# Patient Record
Sex: Female | Born: 1975
Health system: Southern US, Community
[De-identification: ages and names within clinical notes are randomized; demographics above are authoritative.]

## PROBLEM LIST (undated history)

## (undated) DIAGNOSIS — R51 Headache: Secondary | ICD-10-CM

## (undated) DIAGNOSIS — R600 Localized edema: Secondary | ICD-10-CM

## (undated) DIAGNOSIS — M1990 Unspecified osteoarthritis, unspecified site: Secondary | ICD-10-CM

## (undated) DIAGNOSIS — F53 Postpartum depression: Secondary | ICD-10-CM

## (undated) DIAGNOSIS — M549 Dorsalgia, unspecified: Secondary | ICD-10-CM

## (undated) DIAGNOSIS — E559 Vitamin D deficiency, unspecified: Secondary | ICD-10-CM

## (undated) DIAGNOSIS — T7840XA Allergy, unspecified, initial encounter: Secondary | ICD-10-CM

## (undated) DIAGNOSIS — O99345 Other mental disorders complicating the puerperium: Secondary | ICD-10-CM

## (undated) DIAGNOSIS — R0600 Dyspnea, unspecified: Secondary | ICD-10-CM

## (undated) DIAGNOSIS — N979 Female infertility, unspecified: Secondary | ICD-10-CM

## (undated) DIAGNOSIS — E538 Deficiency of other specified B group vitamins: Secondary | ICD-10-CM

## (undated) DIAGNOSIS — M3500 Sicca syndrome, unspecified: Principal | ICD-10-CM

## (undated) DIAGNOSIS — D219 Benign neoplasm of connective and other soft tissue, unspecified: Secondary | ICD-10-CM

## (undated) DIAGNOSIS — O169 Unspecified maternal hypertension, unspecified trimester: Secondary | ICD-10-CM

## (undated) HISTORY — DX: Localized edema: R60.0

## (undated) HISTORY — DX: Dyspnea, unspecified: R06.00

## (undated) HISTORY — DX: Vitamin D deficiency, unspecified: E55.9

## (undated) HISTORY — DX: Deficiency of other specified B group vitamins: E53.8

## (undated) HISTORY — DX: Unspecified maternal hypertension, unspecified trimester: O16.9

## (undated) HISTORY — DX: Allergy, unspecified, initial encounter: T78.40XA

## (undated) HISTORY — PX: LYMPHADENECTOMY: SHX15

## (undated) HISTORY — DX: Dorsalgia, unspecified: M54.9

## (undated) HISTORY — DX: Sicca syndrome, unspecified: M35.00

## (undated) HISTORY — DX: Postpartum depression: F53.0

## (undated) HISTORY — PX: ABDOMINAL ADHESION SURGERY: SHX90

## (undated) HISTORY — PX: ENDOMETRIAL ABLATION: SHX621

## (undated) HISTORY — DX: Female infertility, unspecified: N97.9

## (undated) HISTORY — DX: Unspecified osteoarthritis, unspecified site: M19.90

## (undated) HISTORY — PX: CHOLECYSTECTOMY: SHX55

## (undated) HISTORY — DX: Other mental disorders complicating the puerperium: O99.345

## (undated) HISTORY — PX: MYOMECTOMY: SHX85

---

## 1999-07-20 ENCOUNTER — Encounter (INDEPENDENT_AMBULATORY_CARE_PROVIDER_SITE_OTHER): Payer: Self-pay

## 1999-07-20 ENCOUNTER — Inpatient Hospital Stay (HOSPITAL_COMMUNITY): Admission: RE | Admit: 1999-07-20 | Discharge: 1999-07-22 | Payer: Self-pay | Admitting: Obstetrics & Gynecology

## 2000-05-10 ENCOUNTER — Encounter: Payer: Self-pay | Admitting: Emergency Medicine

## 2000-05-10 ENCOUNTER — Emergency Department (HOSPITAL_COMMUNITY): Admission: EM | Admit: 2000-05-10 | Discharge: 2000-05-10 | Payer: Self-pay | Admitting: *Deleted

## 2000-09-11 ENCOUNTER — Ambulatory Visit (HOSPITAL_COMMUNITY): Admission: RE | Admit: 2000-09-11 | Discharge: 2000-09-11 | Payer: Self-pay | Admitting: Obstetrics & Gynecology

## 2000-09-11 ENCOUNTER — Encounter: Payer: Self-pay | Admitting: Obstetrics & Gynecology

## 2001-02-04 ENCOUNTER — Other Ambulatory Visit: Admission: RE | Admit: 2001-02-04 | Discharge: 2001-02-04 | Payer: Self-pay | Admitting: Obstetrics and Gynecology

## 2003-01-05 ENCOUNTER — Other Ambulatory Visit: Admission: RE | Admit: 2003-01-05 | Discharge: 2003-01-05 | Payer: Self-pay | Admitting: Internal Medicine

## 2003-01-06 ENCOUNTER — Encounter: Payer: Self-pay | Admitting: Internal Medicine

## 2003-01-06 ENCOUNTER — Encounter: Admission: RE | Admit: 2003-01-06 | Discharge: 2003-01-06 | Payer: Self-pay | Admitting: Internal Medicine

## 2005-09-17 ENCOUNTER — Ambulatory Visit: Payer: Self-pay | Admitting: Family Medicine

## 2005-11-08 ENCOUNTER — Other Ambulatory Visit: Admission: RE | Admit: 2005-11-08 | Discharge: 2005-11-08 | Payer: Self-pay | Admitting: Obstetrics and Gynecology

## 2006-04-04 ENCOUNTER — Ambulatory Visit: Payer: Self-pay | Admitting: Family Medicine

## 2006-04-09 ENCOUNTER — Encounter: Admission: RE | Admit: 2006-04-09 | Discharge: 2006-04-09 | Payer: Self-pay | Admitting: Family Medicine

## 2006-11-05 ENCOUNTER — Ambulatory Visit: Payer: Self-pay | Admitting: Family Medicine

## 2007-06-02 ENCOUNTER — Telehealth (INDEPENDENT_AMBULATORY_CARE_PROVIDER_SITE_OTHER): Payer: Self-pay | Admitting: *Deleted

## 2007-10-29 LAB — CONVERTED CEMR LAB: Pap Smear: NORMAL

## 2007-12-31 ENCOUNTER — Ambulatory Visit: Payer: Self-pay | Admitting: Family Medicine

## 2007-12-31 DIAGNOSIS — R5383 Other fatigue: Secondary | ICD-10-CM

## 2007-12-31 DIAGNOSIS — R5381 Other malaise: Secondary | ICD-10-CM

## 2007-12-31 DIAGNOSIS — R55 Syncope and collapse: Secondary | ICD-10-CM | POA: Insufficient documentation

## 2008-01-02 ENCOUNTER — Telehealth (INDEPENDENT_AMBULATORY_CARE_PROVIDER_SITE_OTHER): Payer: Self-pay | Admitting: *Deleted

## 2008-01-02 LAB — CONVERTED CEMR LAB
ALT: 19 units/L (ref 0–35)
AST: 26 units/L (ref 0–37)
Albumin: 3.7 g/dL (ref 3.5–5.2)
Alkaline Phosphatase: 58 units/L (ref 39–117)
BUN: 9 mg/dL (ref 6–23)
Basophils Relative: 0.5 % (ref 0.0–1.0)
Calcium: 9 mg/dL (ref 8.4–10.5)
Chloride: 111 meq/L (ref 96–112)
Creatinine, Ser: 0.8 mg/dL (ref 0.4–1.2)
Eosinophils Relative: 2.2 % (ref 0.0–5.0)
Folate: 8.5 ng/mL
GFR calc non Af Amer: 88 mL/min
Hemoglobin: 13.6 g/dL (ref 12.0–15.0)
LDL Cholesterol: 91 mg/dL (ref 0–99)
Lymphocytes Relative: 34.9 % (ref 12.0–46.0)
Monocytes Relative: 8 % (ref 3.0–11.0)
Neutro Abs: 2.4 10*3/uL (ref 1.4–7.7)
Platelets: 141 10*3/uL — ABNORMAL LOW (ref 150–400)
Prolactin: 9.6 ng/mL
RDW: 11.9 % (ref 11.5–14.6)
Sodium: 140 meq/L (ref 135–145)
Total Bilirubin: 1.5 mg/dL — ABNORMAL HIGH (ref 0.3–1.2)
Triglycerides: 63 mg/dL (ref 0–149)
VLDL: 13 mg/dL (ref 0–40)
Vitamin B-12: 359 pg/mL (ref 211–911)
WBC: 4.4 10*3/uL — ABNORMAL LOW (ref 4.5–10.5)

## 2008-02-02 ENCOUNTER — Ambulatory Visit: Payer: Self-pay | Admitting: Family Medicine

## 2008-02-04 ENCOUNTER — Encounter (INDEPENDENT_AMBULATORY_CARE_PROVIDER_SITE_OTHER): Payer: Self-pay | Admitting: *Deleted

## 2008-02-04 LAB — CONVERTED CEMR LAB: Vit D, 1,25-Dihydroxy: 35 (ref 30–89)

## 2008-02-09 ENCOUNTER — Emergency Department (HOSPITAL_COMMUNITY): Admission: EM | Admit: 2008-02-09 | Discharge: 2008-02-09 | Payer: Self-pay | Admitting: Emergency Medicine

## 2008-04-06 ENCOUNTER — Ambulatory Visit: Payer: Self-pay | Admitting: Family Medicine

## 2008-04-06 DIAGNOSIS — E669 Obesity, unspecified: Secondary | ICD-10-CM

## 2008-04-06 HISTORY — DX: Obesity, unspecified: E66.9

## 2008-04-21 ENCOUNTER — Encounter: Payer: Self-pay | Admitting: Family Medicine

## 2008-04-21 ENCOUNTER — Encounter: Admission: RE | Admit: 2008-04-21 | Discharge: 2008-04-21 | Payer: Self-pay | Admitting: Family Medicine

## 2008-04-30 ENCOUNTER — Ambulatory Visit: Payer: Self-pay | Admitting: Family Medicine

## 2008-04-30 LAB — CONVERTED CEMR LAB
Bilirubin Urine: NEGATIVE
Blood in Urine, dipstick: NEGATIVE
Glucose, Urine, Semiquant: NEGATIVE
Ketones, urine, test strip: NEGATIVE
Protein, U semiquant: NEGATIVE
Specific Gravity, Urine: 1.02
WBC Urine, dipstick: NEGATIVE
pH: 6

## 2008-05-12 LAB — CONVERTED CEMR LAB
Albumin: 3.8 g/dL (ref 3.5–5.2)
BUN: 10 mg/dL (ref 6–23)
Basophils Absolute: 0 10*3/uL (ref 0.0–0.1)
Basophils Relative: 0 % (ref 0–1)
CO2: 19 meq/L (ref 19–32)
Chloride: 108 meq/L (ref 96–112)
Folate: 13.6 ng/mL
HDL: 36 mg/dL — ABNORMAL LOW (ref >39)
Hemoglobin: 13.4 g/dL (ref 12.0–15.0)
LDL Cholesterol: 81 mg/dL (ref 0–99)
Lymphocytes Relative: 40 % (ref 12–46)
MCHC: 33.9 g/dL (ref 30.0–36.0)
Monocytes Relative: 7 % (ref 3–12)
Neutro Abs: 2.6 10*3/uL (ref 1.7–7.7)
Neutrophils Relative %: 52 % (ref 43–77)
Potassium: 3.8 meq/L (ref 3.5–5.3)
RBC: 4.27 M/uL (ref 3.87–5.11)
Total Bilirubin: 1.2 mg/dL (ref 0.3–1.2)
Total Protein: 7.1 g/dL (ref 6.0–8.3)
Triglycerides: 67 mg/dL (ref <150)
VLDL: 13 mg/dL (ref 0–40)
Vitamin B-12: 308 pg/mL (ref 211–911)

## 2008-05-13 ENCOUNTER — Telehealth (INDEPENDENT_AMBULATORY_CARE_PROVIDER_SITE_OTHER): Payer: Self-pay | Admitting: *Deleted

## 2008-05-13 ENCOUNTER — Encounter (INDEPENDENT_AMBULATORY_CARE_PROVIDER_SITE_OTHER): Payer: Self-pay | Admitting: *Deleted

## 2008-05-14 IMAGING — CT CT ABDOMEN W/O CM
1 of 5 series · 13 of 32 positions shown, 19 images · non-contrast
Comparison: None available

CT ABDOMEN

CLINICAL DATA: Abdominal pain, rule out stones

CT ABDOMEN AND PELVIS WITHOUT CONTRAST
TECHNIQUE: Multidetector CT imaging of the abdomen and pelvis was
performed following the standard
protocol without intravenous contrast.

[Series 4: recon 3: greater than (id) · axial · 0.83mm/px · z∈[-468,-124]mm · 13 of 320 slices shown, 19 images]
[im 23/320  soft-tissue]
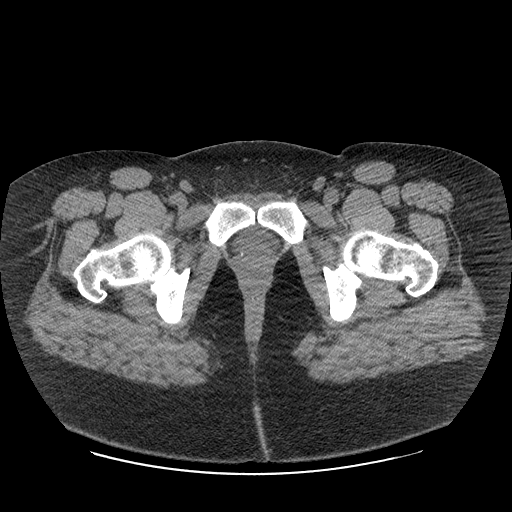
[im 23/320  bone]
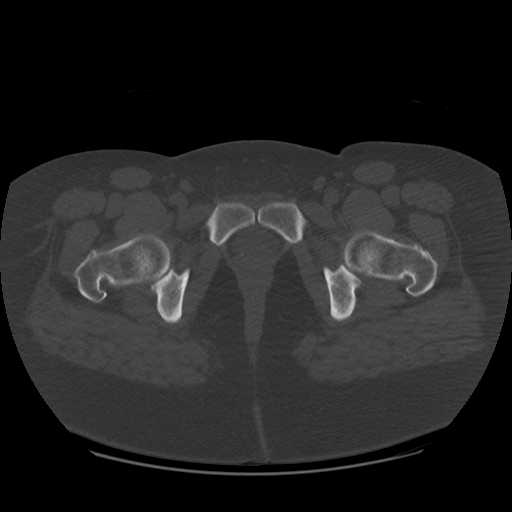
[im 46/320  soft-tissue]
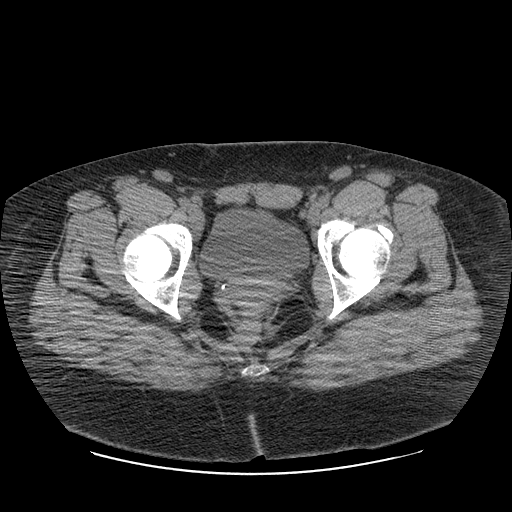
[im 69/320  soft-tissue]
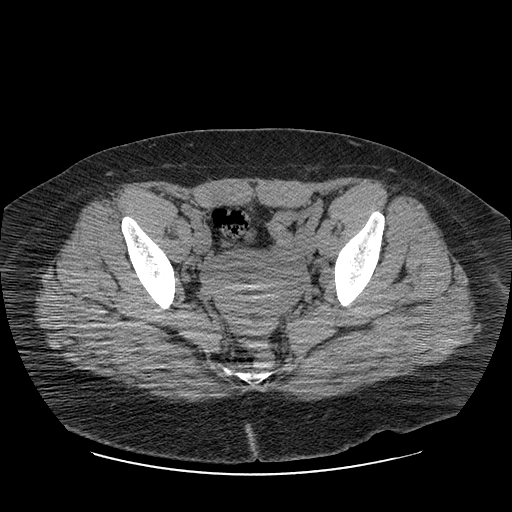
[im 92/320  soft-tissue]
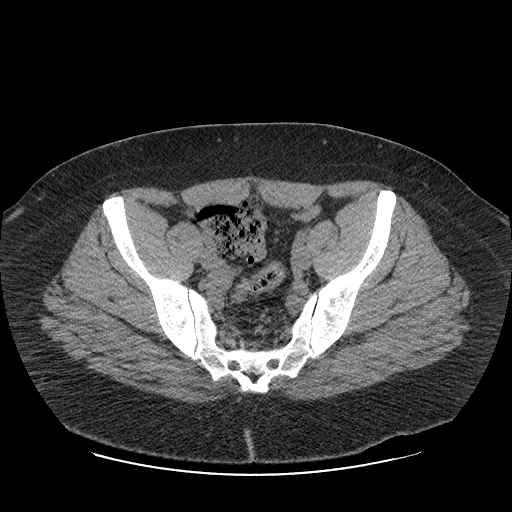
[im 114/320  soft-tissue]
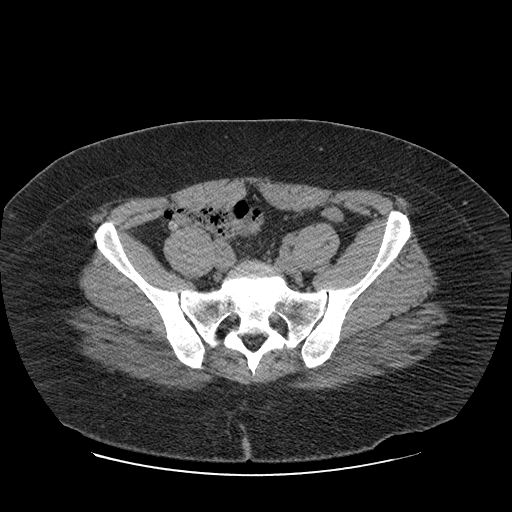
[im 137/320  soft-tissue]
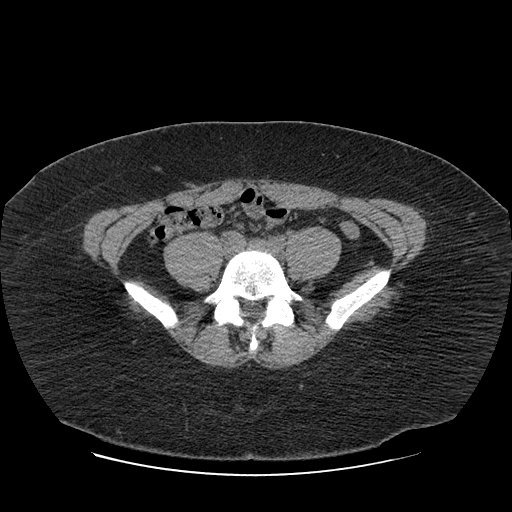
[im 160/320  soft-tissue]
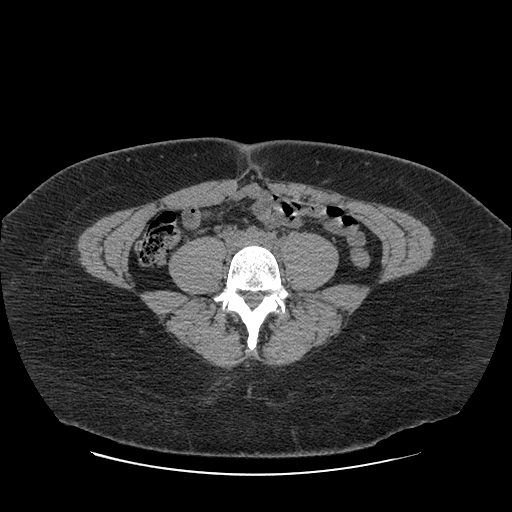
[im 183/320  soft-tissue]
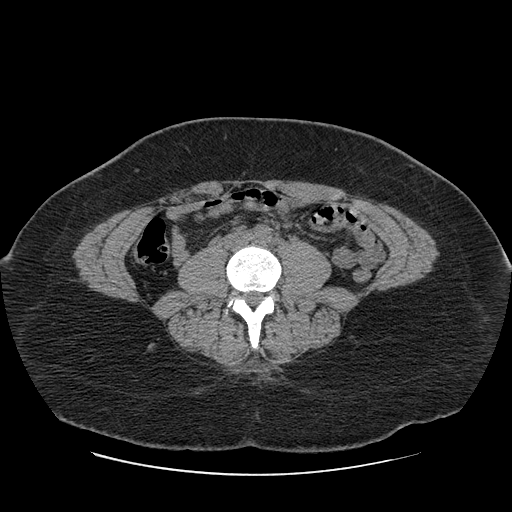
[im 206/320  soft-tissue]
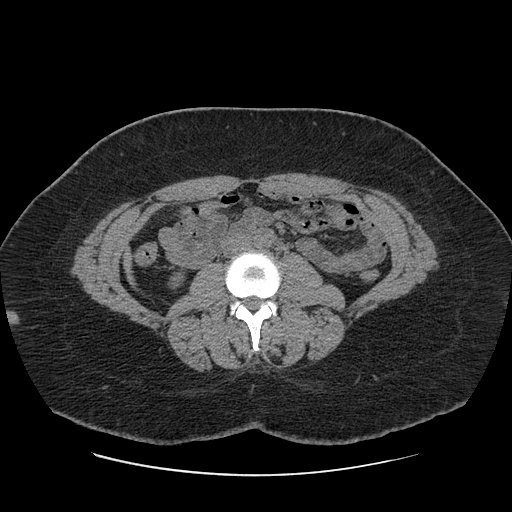
[im 206/320  bone]
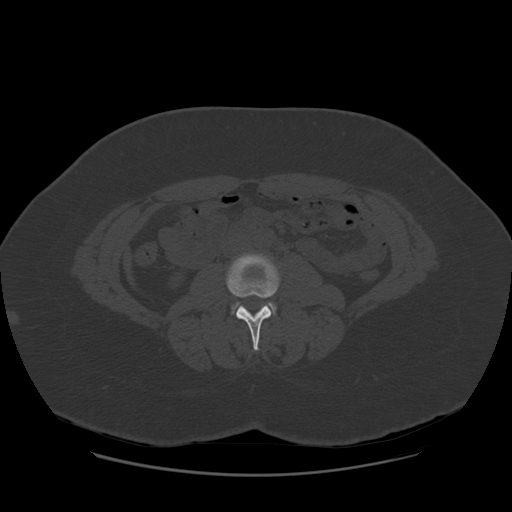
[im 228/320  soft-tissue]
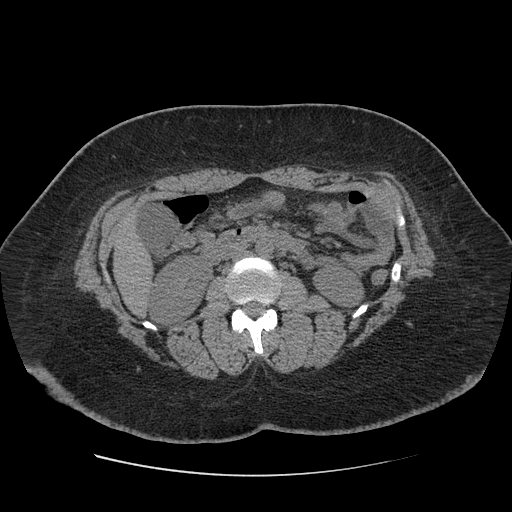
[im 228/320  lung]
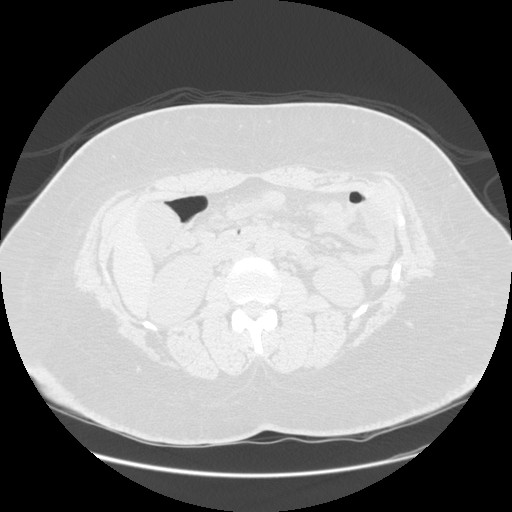
[im 251/320  soft-tissue]
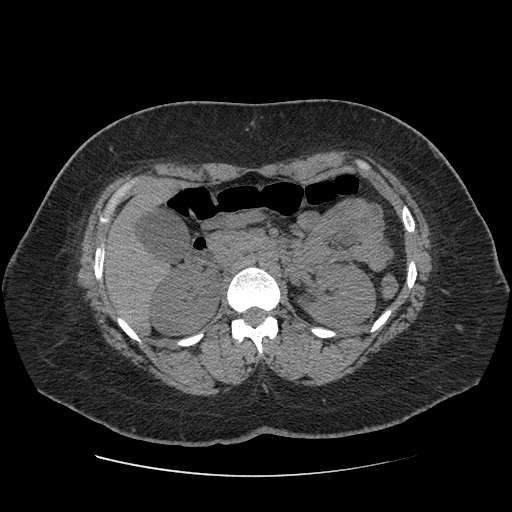
[im 251/320  lung]
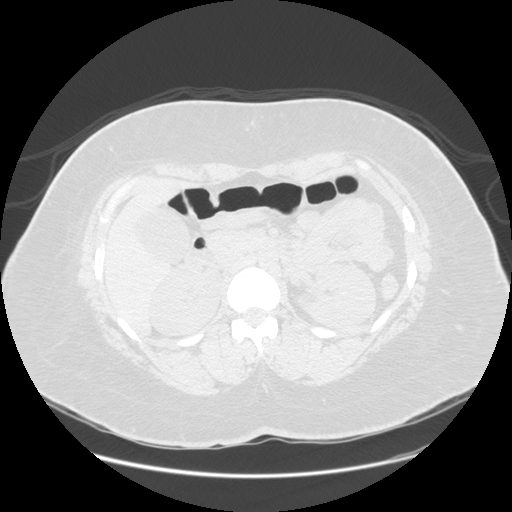
[im 274/320  soft-tissue]
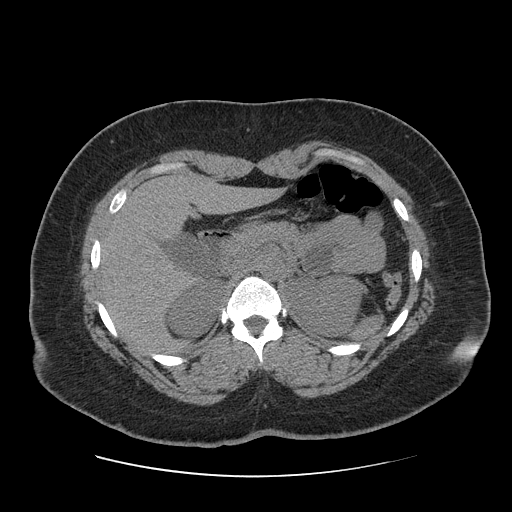
[im 274/320  lung]
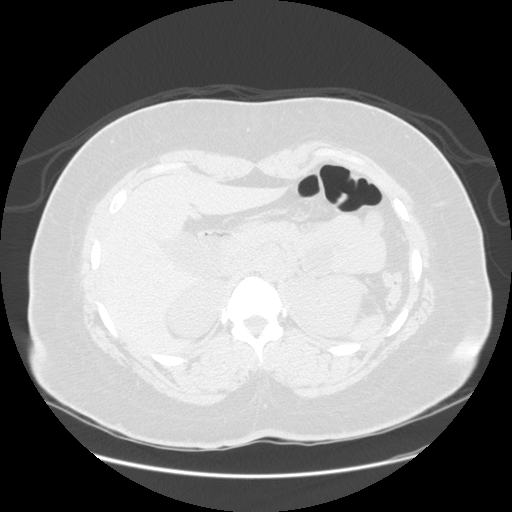
[im 297/320  soft-tissue]
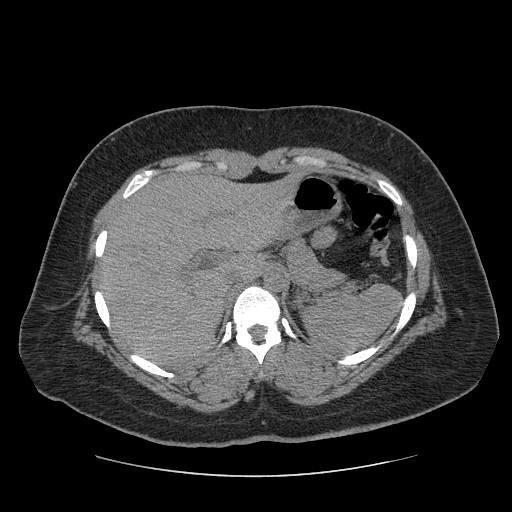
[im 297/320  lung]
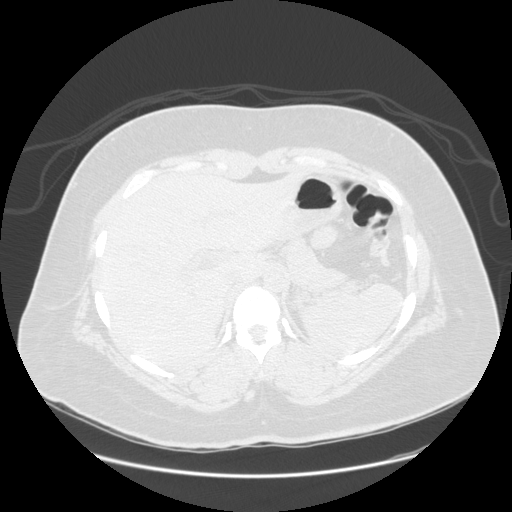

[13 of 32 positions shown; findings below may reference images not displayed]

FINDINGS: Bases are clear.  Noncontrast images of the abdomen
demonstrate normal liver, gallbladder, and pancreas.  The spleen
and adrenal glands appear normal.

No evidence of nephrolithiasis, ureteral lithiasis, or obstructive
uropathy.

Bowel is normal in course and caliber.  Normal appendix.
IMPRESSION: 1.  No acute abdominal process.
2.  No evidence of ureteral or nephrolithiasis.

CT PELVIS
FINDINGS: No free fluid in the pelvis.  The bladder uterus appear
normal.  The left and right ovary appears normal.  The rectum
appears normal.  No aggressive osseous lesions.
IMPRESSION: 1.  No acute pelvic process.

## 2008-05-31 ENCOUNTER — Ambulatory Visit: Payer: Self-pay | Admitting: Family Medicine

## 2008-05-31 ENCOUNTER — Encounter: Admission: RE | Admit: 2008-05-31 | Discharge: 2008-05-31 | Payer: Self-pay | Admitting: Family Medicine

## 2008-05-31 ENCOUNTER — Encounter (INDEPENDENT_AMBULATORY_CARE_PROVIDER_SITE_OTHER): Payer: Self-pay | Admitting: *Deleted

## 2008-05-31 DIAGNOSIS — R109 Unspecified abdominal pain: Secondary | ICD-10-CM | POA: Insufficient documentation

## 2008-05-31 DIAGNOSIS — K810 Acute cholecystitis: Secondary | ICD-10-CM | POA: Insufficient documentation

## 2008-05-31 LAB — CONVERTED CEMR LAB
Nitrite: NEGATIVE
Specific Gravity, Urine: <1.005
pH: 7.5

## 2008-06-01 ENCOUNTER — Telehealth (INDEPENDENT_AMBULATORY_CARE_PROVIDER_SITE_OTHER): Payer: Self-pay | Admitting: *Deleted

## 2008-06-01 ENCOUNTER — Encounter (INDEPENDENT_AMBULATORY_CARE_PROVIDER_SITE_OTHER): Payer: Self-pay | Admitting: *Deleted

## 2008-06-01 LAB — CONVERTED CEMR LAB
AST: 19 units/L (ref 0–37)
Basophils Absolute: 0 10*3/uL (ref 0.0–0.1)
Basophils Relative: 0.4 % (ref 0.0–3.0)
Chloride: 107 meq/L (ref 96–112)
Creatinine, Ser: 0.9 mg/dL (ref 0.4–1.2)
Eosinophils Absolute: 0.1 10*3/uL (ref 0.0–0.7)
GFR calc non Af Amer: 76 mL/min
Hgb A1c MFr Bld: 5.2 % (ref 4.6–6.0)
MCHC: 33 g/dL (ref 30.0–36.0)
MCV: 97.7 fL (ref 78.0–100.0)
Neutrophils Relative %: 61.5 % (ref 43.0–77.0)
Platelets: 173 10*3/uL (ref 150–400)
RBC: 4.08 M/uL (ref 3.87–5.11)
RDW: 11.1 % — ABNORMAL LOW (ref 11.5–14.6)
TSH: 2.59 microintl units/mL (ref 0.35–5.50)
Total Bilirubin: 1.2 mg/dL (ref 0.3–1.2)

## 2008-06-02 ENCOUNTER — Encounter: Payer: Self-pay | Admitting: Family Medicine

## 2008-06-09 ENCOUNTER — Encounter: Payer: Self-pay | Admitting: Family Medicine

## 2008-06-16 ENCOUNTER — Encounter: Payer: Self-pay | Admitting: Family Medicine

## 2008-06-24 ENCOUNTER — Telehealth (INDEPENDENT_AMBULATORY_CARE_PROVIDER_SITE_OTHER): Payer: Self-pay | Admitting: *Deleted

## 2008-07-14 ENCOUNTER — Encounter (INDEPENDENT_AMBULATORY_CARE_PROVIDER_SITE_OTHER): Payer: Self-pay | Admitting: *Deleted

## 2008-07-14 ENCOUNTER — Ambulatory Visit (HOSPITAL_COMMUNITY): Admission: RE | Admit: 2008-07-14 | Discharge: 2008-07-15 | Payer: Self-pay | Admitting: *Deleted

## 2008-08-06 ENCOUNTER — Encounter: Payer: Self-pay | Admitting: Family Medicine

## 2008-08-06 DIAGNOSIS — Z9089 Acquired absence of other organs: Secondary | ICD-10-CM | POA: Insufficient documentation

## 2010-09-27 ENCOUNTER — Encounter
Admission: RE | Admit: 2010-09-27 | Discharge: 2010-09-27 | Payer: Self-pay | Source: Home / Self Care | Admitting: Obstetrics and Gynecology

## 2011-03-06 NOTE — Op Note (Signed)
NAMEMarland Kitchen  Stacey Huerta, Stacey Huerta NO.:  [id number]   MEDICAL RECORD NO.:  [id number]          PATIENT TYPE:  OIB   LOCATION:  1336                         FACILITY:  [facility]   PHYSICIAN:  Alfonse Ras, MD   DATE OF BIRTH:  1976-06-15   DATE OF PROCEDURE:  DATE OF DISCHARGE:                               OPERATIVE REPORT   PREOPERATIVE DIAGNOSIS:  Symptomatic cholelithiasis.   POSTOPERATIVE DIAGNOSIS:  Symptomatic cholelithiasis.   FINDINGS:  Normal cholangiogram, very short cystic duct.   PROCEDURE:  Laparoscopic cholecystectomy with intraoperative  cholangiogram.   SURGEON:  Alfonse Ras, M.D.   ASSISTANT:  Wilmon Arms. Tsuei, M.D.   ANESTHESIA:  General.   DESCRIPTION:  The patient was taken to the operating room and placed in  a supine position.  After adequate general anesthesia was induced, the  abdomen was prepped and draped in normal sterile fashion.  Using a  transverse infraumbilical incision, I dissected down to the fascia.  Fascia was opened vertically.  An 0-Vicryl purse-string suture was  placed around the fascial defect and Hassan trocar was placed in the  abdomen.  Pneumoperitoneum was obtained.  Under direct vision a 11-mm  trocar was placed in subxiphoid region and two 5-mm trocars were placed  in the right abdomen.  Gallbladder was identified and retracted  cephalad.  Filmy adhesions were taken down with blunt dissection off the  gallbladder wall.  The neck of the gallbladder was retracted laterally  and a very short cystic duct was identified after tedious dissection,  identifying its junction with the gallbladder and common bile duct.  It  was clipped proximally on the gallbladder and a small dichotomy was  made.  Cholangiogram was performed which showed a very short cystic duct  but free flow into the duodenum.  No filling defects, and normal right  and left hepatic ducts; because the cystic duct was so short, I opted to  ligate it with a  0-PDS Endoloop suture and a clip, it was previously  divided.  Cystic artery was dissected in a similar fashion with a  critical view, it was triplyclipped and divided.  The gallbladder was  taken off the gallbladder bed using Bovie electrocautery and placed in  an EndoCatch bag.  The right upper quadrant was copiously irrigated and  adequate hemostasis was ensured.  The gallbladder was removed through  the umbilical port without difficulty.  The fascial defect was closed  with an 0-Vicryl purse-string suture.  Skin incisions were closed with  subcuticular 4-0 Monocryl.  Dermabond and Steri-Strips were placed, and  sterile dressings.  The patient tolerated the procedure well and went to  PACU in good condition.      Alfonse Ras, MD  Electronically Signed    KRE/MEDQ  D:  07/14/2008  T:  07/15/2008  Job:  [id number]

## 2011-03-09 NOTE — Discharge Summary (Signed)
[facility]  Patient:    Stacey Huerta                     MRN: [id number] Adm. Date:  [id number] Disc. Date: 07/22/99 Attending:  Cleatrice Burke                           Discharge Summary  DISCHARGE DIAGNOSIS:          Symptomatic fibroids, questionable unicornate uterus.  OPERATIONS/PROCEDURES:        Abdominal myomectomy.  HISTORY:                      Patient is a 35 year old gravida 0 who complained of sporadic menometrorrhagia, urinary frequency, dyspareunia.  Patient has a known  history of fibroid uterus.  The fibroids were felt to have increased in size. Patient was therefore desirous of definitive therapy and was consented for an abdominal myomectomy.  She had an uneventful procedure on September 28.  HOSPITAL COURSE:              Essentially unremarkable.  She was voiding well, ambulating well and tolerating p.o. well at the time of her discharge on postoperative day #2.  Abdomen was soft.  Incision was clean, dry and intact. Patient was afebrile and she was tolerating regular diet.  At this point there ere no contraindications for her being discharge.  DISCHARGE PHYSICAL EXAMINATION:  VITAL SIGNS:                  Stable.  Patient was afebrile.  LUNGS:                        Clear to auscultation bilaterally.  HEART:                        Regular rate and rhythm.  ABDOMEN:                      Soft.  Incision clean, dry and intact.  EXTREMITIES:                  Revealed no edema.  DISCHARGE LABORATORY DATA:    Her white count was 9.7, hemoglobin 11.2, hematocrit 32.8, platelets 181.  Pathology showed benign leiomyoma and smooth muscle, uterine serosal fibrous adhesions associated with benign mesothelial cells.  DIET:                         As tolerated.  ACTIVITY:                     Per [facility] discharge instruction  sheet.  MEDICATIONS:                  Tylox one q.3-4h. p.r.n.  pain.  FOLLOWUP:                     On October 2 for staple removal and then again in six weeks. DD:  09/10/99 TD:  09/11/99 Job: [id number]

## 2011-07-17 LAB — URINALYSIS, ROUTINE W REFLEX MICROSCOPIC
Hgb urine dipstick: NEGATIVE
Nitrite: NEGATIVE
Specific Gravity, Urine: 1.022
Urobilinogen, UA: 1

## 2011-07-17 LAB — POCT PREGNANCY, URINE
Operator id: 277751
Preg Test, Ur: NEGATIVE

## 2011-07-23 LAB — COMPREHENSIVE METABOLIC PANEL
Albumin: 3.4 — ABNORMAL LOW
Alkaline Phosphatase: 63
BUN: 5 — ABNORMAL LOW
Chloride: 108
Glucose, Bld: 95
Potassium: 3.7
Total Bilirubin: 1.8 — ABNORMAL HIGH

## 2011-07-23 LAB — DIFFERENTIAL
Basophils Absolute: 0
Basophils Relative: 1
Eosinophils Absolute: 0.1
Monocytes Absolute: 0.5
Neutro Abs: 2.7
Neutrophils Relative %: 57

## 2011-07-23 LAB — PREGNANCY, URINE: Preg Test, Ur: NEGATIVE

## 2011-07-23 LAB — CBC
HCT: 38
Hemoglobin: 12.9
RBC: 4.07
WBC: 4.8

## 2011-12-19 ENCOUNTER — Ambulatory Visit: Payer: Self-pay | Admitting: Obstetrics and Gynecology

## 2011-12-27 ENCOUNTER — Other Ambulatory Visit: Payer: Self-pay | Admitting: Obstetrics and Gynecology

## 2011-12-27 DIAGNOSIS — N979 Female infertility, unspecified: Secondary | ICD-10-CM

## 2012-01-03 ENCOUNTER — Ambulatory Visit (HOSPITAL_COMMUNITY)
Admission: RE | Admit: 2012-01-03 | Discharge: 2012-01-03 | Disposition: A | Discharge status: Home / Self Care | Payer: No Typology Code available for payment source | Source: Ambulatory Visit | Attending: Obstetrics and Gynecology | Admitting: Obstetrics and Gynecology

## 2012-01-03 DIAGNOSIS — N979 Female infertility, unspecified: Secondary | ICD-10-CM | POA: Insufficient documentation

## 2012-01-03 DIAGNOSIS — D259 Leiomyoma of uterus, unspecified: Secondary | ICD-10-CM | POA: Insufficient documentation

## 2012-01-03 MED ORDER — IOHEXOL 300 MG/ML  SOLN: 60.0000 mL | Freq: Once | INTRAMUSCULAR | Status: AC | PRN

## 2012-01-16 ENCOUNTER — Encounter (INDEPENDENT_AMBULATORY_CARE_PROVIDER_SITE_OTHER): Payer: No Typology Code available for payment source | Admitting: Obstetrics and Gynecology

## 2012-01-16 DIAGNOSIS — Z01419 Encounter for gynecological examination (general) (routine) without abnormal findings: Secondary | ICD-10-CM

## 2012-01-16 DIAGNOSIS — N912 Amenorrhea, unspecified: Secondary | ICD-10-CM

## 2012-01-16 DIAGNOSIS — R87810 Cervical high risk human papillomavirus (HPV) DNA test positive: Secondary | ICD-10-CM

## 2012-02-14 ENCOUNTER — Other Ambulatory Visit: Payer: Self-pay

## 2012-02-14 ENCOUNTER — Telehealth: Payer: Self-pay | Admitting: Obstetrics and Gynecology

## 2012-02-14 MED ORDER — MEFENAMIC ACID 250 MG PO CAPS: ORAL_CAPSULE | ORAL | Status: DC

## 2012-02-14 NOTE — Telephone Encounter (Signed)
Tc from pt. Pt wants [facility] to know has laparoscopic surgery sched 05/23/12 with Dr. April Manson. Pt states,"will likely return to our office to continue infertility tx after surgery done". Pt c/o severe abd pain with this menses and is requesting Ponstel(presc by prev physician). Told pt will consult with [facility] per recs. Pt voices understanding.

## 2012-02-14 NOTE — Telephone Encounter (Signed)
PT WANT TO SPEAK WITH CHANDRA

## 2012-02-15 NOTE — Telephone Encounter (Signed)
Rx for Ponstell called to [facility]([address]) per vh. Pt agrees.

## 2012-04-30 NOTE — H&P (Signed)
REPRODUCTIVE ENDOCRINOLOGY/INFERTILITY PREOP NOTE:  Stacey Huerta  1976/02/06  [id number]  Stacey Huerta is a 36 y.o. female referred to me by Dr. Dierdre Forth for myoma and right tubal proximal occlusion. She is scheduled for  laparoscopy, LOA, myomectomy via gel-port assistance, with concomitant set up to perform fluoroscopic tubal catheterization to evaluate and recanalize right tube. She also has significant right-sided pelvic pain, worsening with her periods. Patient's has a history of myomectomy for the single, 377 g myoma via laparotomy in 2001. Lately she has been having heavy periods, with dysmenorrhea, lasting for 5 days. Menstrual intervals, have also been irregular every 21-28 days. Since she has been trying to conceive for 24 months, and HSG was ordered. I reviewed the HSG from 01/03/2012. The uterine cavity is distended with around filling defect, probable submucosal myoma. The right tube was proximally occluded. The left tube fills into the distal segment, and its spillage cannot be shown but there is no distention.  She had reproductive hormonal workup done: Progesterone was 5.2 ng per mL, prolactin was 9.0 ng per mL and TSH was 1.9-3 micro-international units per mL.  GYN history:  Menarche at age 53 periods every 21-28 days, lasting for 5 days. LMP was 01/19/2012, PMP was 12/27/2011  She's never used birth control pills.  The couple has intercourse about 4 times per week. She has history of herpes simplex infection from 2011. No other STDs. Last Pap smear was 12/2011.  Medical history: None  Medications: None.  Allergies: Allergic to sulfa and penicillin  Social history: She is divorced, she lives with her significant other, Antonietta Barcelona. She is a IT trainer. She denies substance abuse. She exercises occasionally.  Surgical history: 2009-up scopic cholecystectomy.  2001-laparotomy, myomectomy, ( see history of present illness)  Review of systems: All 12 systems were reviewed, is  documented in the attached questionnaire. It's relevant for dizziness, headaches, and clear nipple discharge from both breasts.  Family history: Maternal aunt has breast cancer. Paternal grandmother had colon cancer. Father and paternal grandmother had diabetes.   Physical exam:  Well-developed, overweight, African American female in no acute distress  BP 124/80  Pulse 68  Temp 98.6 F (37 C)  Ht 1.778 m (5\' 10" )  Wt 151.501 kg (334 lb)  BMI 47.92 kg/m2  LMP 04/29/2012 Breasts: No masses, pendulous. Clear , clear discharge from multiple ductal openings (wet mount of the nipple discharge, shows amorphous material and no fat globules)  Abdomen: Soft, nontender, no hepatosplenomegaly. Well-healed Pfannenstiel scar.  Pelvic exam:  External genitalia, Bartholin's, Skene's, urethra: Normal Vagina; normal Cervix: grossly normal Corpus: Normal size anteverted, nontender Adnexa: No masses, nontender  Transvaginal ultrasound:  Uterine corpus it measures 56 x 54 x 42 mm with a volume of 67.5 cm. Endometrium is 7.5 mm thick and is distorted and pushed anteriorly with a posterior right sided type II submucosal myoma, measuring 31 x 24 x 31 mm. Only 20-30% of the myoma appears to be intracavitary.  Right ovary measures 32 x 37 x 28 mm, with a volume of 17 cm. It has antral follicle count of 8.  Left ovary measures 23 x 24 x 17 mm with a volume of 4.7 cm. It has an antral follicle count of 3.   Impression: Recurrent uterine leiomyoma, symptomatic. It is of type II submucosal location.  Right proximal tubal occlusion: Artifact versus post myomectomy consequence.  Left fallopian tube: Probably patent.  Advanced reproductive age with a desire to conceive   Plan:  Laparoscopy, LOA,  myomectomy via gel-port assistance, with concomitant set up to perform fluoroscopic tubal catheterization to evaluate and recanalize right tube. I discussed the benefits, risks and recovery time from such surgery. If  endometriosis is encountered during laparoscopy, I will ablate or excise it as well.   Electronically signed by: Judie Petit. Fermin Schwab, MD 04/30/2012 9:02 AM

## 2012-05-09 ENCOUNTER — Encounter (HOSPITAL_COMMUNITY): Payer: Self-pay

## 2012-05-16 ENCOUNTER — Encounter (HOSPITAL_COMMUNITY)
Admission: RE | Admit: 2012-05-16 | Discharge: 2012-05-16 | Disposition: A | Discharge status: Home / Self Care | Payer: No Typology Code available for payment source | Source: Ambulatory Visit | Attending: Obstetrics and Gynecology | Admitting: Obstetrics and Gynecology

## 2012-05-16 ENCOUNTER — Encounter (HOSPITAL_COMMUNITY): Payer: Self-pay

## 2012-05-16 HISTORY — DX: Headache: R51

## 2012-05-16 LAB — CBC
HCT: 37.2 % (ref 36.0–46.0)
MCHC: 33.6 g/dL (ref 30.0–36.0)
Platelets: 179 10*3/uL (ref 150–400)
RDW: 12.7 % (ref 11.5–15.5)
WBC: 5.3 10*3/uL (ref 4.0–10.5)

## 2012-05-16 LAB — SURGICAL PCR SCREEN
MRSA, PCR: NEGATIVE
Staphylococcus aureus: NEGATIVE

## 2012-05-16 NOTE — Patient Instructions (Addendum)
Your procedure is scheduled on:05/23/12  Enter through the Main Entrance at :0600 am Pick up desk phone and dial [phone] and inform us of your arrival.  Please call [phone] if you have any problems the morning of surgery.  Remember: Do not eat after midnight:Thursday Do not drink after:midnight Thursday  Take these meds the morning of surgery with a sip of water:none  DO NOT wear jewelry, eye make-up, lotion, or dark fingernail polish. Do not shave for 48 hours prior to surgery.  If you are to be admitted after surgery, leave suitcase in car until your room has been assigned. Patients discharged on the day of surgery will not be allowed to drive home.   Remember to use your Hibiclens as instructed.

## 2012-05-22 MED ORDER — GENTAMICIN SULFATE 40 MG/ML IJ SOLN
INTRAVENOUS | Status: AC
  Administered 2012-05-23: 100 mL via INTRAVENOUS
  Filled 2012-05-22: qty 12.4

## 2012-05-23 ENCOUNTER — Ambulatory Visit (HOSPITAL_COMMUNITY): Payer: No Typology Code available for payment source | Admitting: Anesthesiology

## 2012-05-23 ENCOUNTER — Encounter (HOSPITAL_COMMUNITY): Payer: Self-pay

## 2012-05-23 ENCOUNTER — Encounter (HOSPITAL_COMMUNITY)
Admission: RE | Disposition: A | Discharge status: Home / Self Care | Payer: Self-pay | Source: Ambulatory Visit | Attending: Obstetrics and Gynecology

## 2012-05-23 ENCOUNTER — Ambulatory Visit (HOSPITAL_COMMUNITY)
Admission: RE | Admit: 2012-05-23 | Discharge: 2012-05-23 | Disposition: A | Discharge status: Home / Self Care | Payer: No Typology Code available for payment source | Source: Ambulatory Visit | Attending: Obstetrics and Gynecology | Admitting: Obstetrics and Gynecology

## 2012-05-23 ENCOUNTER — Ambulatory Visit (HOSPITAL_COMMUNITY): Payer: No Typology Code available for payment source

## 2012-05-23 ENCOUNTER — Encounter (HOSPITAL_COMMUNITY): Payer: Self-pay | Admitting: Anesthesiology

## 2012-05-23 DIAGNOSIS — N949 Unspecified condition associated with female genital organs and menstrual cycle: Secondary | ICD-10-CM | POA: Insufficient documentation

## 2012-05-23 DIAGNOSIS — N803 Endometriosis of pelvic peritoneum, unspecified: Secondary | ICD-10-CM | POA: Insufficient documentation

## 2012-05-23 DIAGNOSIS — N971 Female infertility of tubal origin: Secondary | ICD-10-CM | POA: Insufficient documentation

## 2012-05-23 DIAGNOSIS — D251 Intramural leiomyoma of uterus: Secondary | ICD-10-CM | POA: Insufficient documentation

## 2012-05-23 DIAGNOSIS — Z01812 Encounter for preprocedural laboratory examination: Secondary | ICD-10-CM | POA: Insufficient documentation

## 2012-05-23 DIAGNOSIS — D252 Subserosal leiomyoma of uterus: Secondary | ICD-10-CM

## 2012-05-23 DIAGNOSIS — N920 Excessive and frequent menstruation with regular cycle: Secondary | ICD-10-CM | POA: Insufficient documentation

## 2012-05-23 DIAGNOSIS — Z01818 Encounter for other preprocedural examination: Secondary | ICD-10-CM | POA: Insufficient documentation

## 2012-05-23 DIAGNOSIS — R109 Unspecified abdominal pain: Secondary | ICD-10-CM

## 2012-05-23 HISTORY — PX: MYOMECTOMY: SHX85

## 2012-05-23 LAB — TYPE AND SCREEN
ABO/RH(D): O POS
Antibody Screen: NEGATIVE

## 2012-05-23 LAB — PREGNANCY, URINE: Preg Test, Ur: NEGATIVE

## 2012-05-23 SURGERY — ROBOTIC ASSISTED LAPAROSCOPIC LYSIS OF ADHESION
Anesthesia: General | Site: Abdomen | Wound class: Clean Contaminated

## 2012-05-23 MED ORDER — HYDROMORPHONE HCL PF 1 MG/ML IJ SOLN
INTRAMUSCULAR | Status: AC
  Filled 2012-05-23: qty 1

## 2012-05-23 MED ORDER — PROPOFOL 10 MG/ML IV EMUL
INTRAVENOUS | Status: AC
  Filled 2012-05-23: qty 20

## 2012-05-23 MED ORDER — DEXAMETHASONE SODIUM PHOSPHATE 10 MG/ML IJ SOLN
INTRAMUSCULAR | Status: AC
  Filled 2012-05-23: qty 1

## 2012-05-23 MED ORDER — GLYCOPYRROLATE 0.2 MG/ML IJ SOLN
INTRAMUSCULAR | Status: DC | PRN
  Administered 2012-05-23: 0.4 mg via INTRAVENOUS

## 2012-05-23 MED ORDER — NEOSTIGMINE METHYLSULFATE 1 MG/ML IJ SOLN
INTRAMUSCULAR | Status: AC
  Filled 2012-05-23: qty 10

## 2012-05-23 MED ORDER — SODIUM CHLORIDE 0.9 % IJ SOLN
INTRAMUSCULAR | Status: DC | PRN
  Administered 2012-05-23: 60 mL

## 2012-05-23 MED ORDER — DEXAMETHASONE SODIUM PHOSPHATE 10 MG/ML IJ SOLN
INTRAMUSCULAR | Status: DC | PRN
  Administered 2012-05-23: 10 mg via INTRAVENOUS

## 2012-05-23 MED ORDER — MIDAZOLAM HCL 5 MG/5ML IJ SOLN
INTRAMUSCULAR | Status: DC | PRN
  Administered 2012-05-23: 2 mg via INTRAVENOUS

## 2012-05-23 MED ORDER — KETOROLAC TROMETHAMINE 30 MG/ML IJ SOLN
INTRAMUSCULAR | Status: AC
  Filled 2012-05-23: qty 1

## 2012-05-23 MED ORDER — LIDOCAINE HCL (CARDIAC) 20 MG/ML IV SOLN
INTRAVENOUS | Status: AC
  Filled 2012-05-23: qty 5

## 2012-05-23 MED ORDER — HYDROMORPHONE HCL PF 1 MG/ML IJ SOLN: 0.2500 mg | INTRAMUSCULAR | Status: DC | PRN

## 2012-05-23 MED ORDER — OXYCODONE-ACETAMINOPHEN 7.5-325 MG PO TABS: 1.0000 | ORAL_TABLET | ORAL | Status: AC | PRN

## 2012-05-23 MED ORDER — MORPHINE SULFATE 4 MG/ML IJ SOLN
4.0000 mg | INTRAMUSCULAR | Status: DC | PRN
  Administered 2012-05-23 (×2): 4 mg via INTRAVENOUS
  Filled 2012-05-23 (×2): qty 1

## 2012-05-23 MED ORDER — MIDAZOLAM HCL 2 MG/2ML IJ SOLN
INTRAMUSCULAR | Status: AC
  Filled 2012-05-23: qty 2

## 2012-05-23 MED ORDER — LIDOCAINE HCL (CARDIAC) 20 MG/ML IV SOLN
INTRAVENOUS | Status: DC | PRN
  Administered 2012-05-23: 100 mg via INTRAVENOUS

## 2012-05-23 MED ORDER — INDIGOTINDISULFONATE SODIUM 8 MG/ML IJ SOLN
INTRAMUSCULAR | Status: AC
  Filled 2012-05-23: qty 5

## 2012-05-23 MED ORDER — LACTATED RINGERS IR SOLN
Status: DC | PRN
  Administered 2012-05-23: 3000 mL

## 2012-05-23 MED ORDER — ONDANSETRON HCL 4 MG/2ML IJ SOLN
INTRAMUSCULAR | Status: DC | PRN
  Administered 2012-05-23: 4 mg via INTRAVENOUS

## 2012-05-23 MED ORDER — VASOPRESSIN 20 UNIT/ML IJ SOLN
INTRAMUSCULAR | Status: DC | PRN
  Administered 2012-05-23: 20 [IU]

## 2012-05-23 MED ORDER — PROPOFOL 10 MG/ML IV EMUL
INTRAVENOUS | Status: DC | PRN
  Administered 2012-05-23: 200 mg via INTRAVENOUS

## 2012-05-23 MED ORDER — FENTANYL CITRATE 0.05 MG/ML IJ SOLN
INTRAMUSCULAR | Status: AC
  Filled 2012-05-23: qty 5

## 2012-05-23 MED ORDER — NEOSTIGMINE METHYLSULFATE 1 MG/ML IJ SOLN
INTRAMUSCULAR | Status: DC | PRN
  Administered 2012-05-23: 4 mg via INTRAVENOUS

## 2012-05-23 MED ORDER — ROCURONIUM BROMIDE 100 MG/10ML IV SOLN
INTRAVENOUS | Status: DC | PRN
  Administered 2012-05-23: 50 mg via INTRAVENOUS
  Administered 2012-05-23 (×2): 20 mg via INTRAVENOUS

## 2012-05-23 MED ORDER — ONDANSETRON HCL 4 MG/2ML IJ SOLN
INTRAMUSCULAR | Status: AC
  Filled 2012-05-23: qty 2

## 2012-05-23 MED ORDER — ARTIFICIAL TEARS OP OINT
TOPICAL_OINTMENT | OPHTHALMIC | Status: AC
  Filled 2012-05-23: qty 3.5

## 2012-05-23 MED ORDER — ROCURONIUM BROMIDE 50 MG/5ML IV SOLN
INTRAVENOUS | Status: AC
  Filled 2012-05-23: qty 1

## 2012-05-23 MED ORDER — FENTANYL CITRATE 0.05 MG/ML IJ SOLN
INTRAMUSCULAR | Status: DC | PRN
  Administered 2012-05-23: 150 ug via INTRAVENOUS
  Administered 2012-05-23 (×7): 50 ug via INTRAVENOUS

## 2012-05-23 MED ORDER — HYDROMORPHONE HCL PF 1 MG/ML IJ SOLN
INTRAMUSCULAR | Status: DC | PRN
  Administered 2012-05-23 (×2): 0.5 mg via INTRAVENOUS

## 2012-05-23 MED ORDER — BUPIVACAINE-EPINEPHRINE 0.25% -1:200000 IJ SOLN
INTRAMUSCULAR | Status: DC | PRN
  Administered 2012-05-23: 16 mL

## 2012-05-23 MED ORDER — LACTATED RINGERS IV SOLN
INTRAVENOUS | Status: DC
  Administered 2012-05-23: 1000 mL via INTRAVENOUS
  Administered 2012-05-23 (×2): via INTRAVENOUS

## 2012-05-23 MED ORDER — KETOROLAC TROMETHAMINE 30 MG/ML IJ SOLN
INTRAMUSCULAR | Status: DC | PRN
  Administered 2012-05-23: 30 mg via INTRAVENOUS

## 2012-05-23 MED ORDER — VASOPRESSIN 20 UNIT/ML IJ SOLN
INTRAMUSCULAR | Status: AC
  Filled 2012-05-23: qty 1

## 2012-05-23 MED ORDER — GLYCOPYRROLATE 0.2 MG/ML IJ SOLN
INTRAMUSCULAR | Status: AC
  Filled 2012-05-23: qty 3

## 2012-05-23 MED ORDER — HYDROMORPHONE HCL PF 1 MG/ML IJ SOLN
INTRAMUSCULAR | Status: AC
  Administered 2012-05-23: 0.5 mg via INTRAVENOUS
  Filled 2012-05-23: qty 1

## 2012-05-23 MED ORDER — HYDROMORPHONE HCL PF 1 MG/ML IJ SOLN
0.2500 mg | INTRAMUSCULAR | Status: DC | PRN
  Administered 2012-05-23 (×2): 0.5 mg via INTRAVENOUS

## 2012-05-23 MED ORDER — BUPIVACAINE-EPINEPHRINE PF 0.25-1:200000 % IJ SOLN
INTRAMUSCULAR | Status: AC
  Filled 2012-05-23: qty 30

## 2012-05-23 MED ORDER — ARTIFICIAL TEARS OP OINT
TOPICAL_OINTMENT | OPHTHALMIC | Status: DC | PRN
  Administered 2012-05-23: 1 via OPHTHALMIC

## 2012-05-23 SURGICAL SUPPLY — 95 items
ADAPTER CATH SYR TO TUBING 38M (ADAPTER) IMPLANT
ADH SKN CLS APL DERMABOND .7 (GAUZE/BANDAGES/DRESSINGS) ×2
ADPR CATH LL SYR 3/32 TPR (ADAPTER)
BAG URINE DRAINAGE (UROLOGICAL SUPPLIES) ×2 IMPLANT
BARRIER ADHS 3X4 INTERCEED (GAUZE/BANDAGES/DRESSINGS) ×1 IMPLANT
BRR ADH 4X3 ABS CNTRL BYND (GAUZE/BANDAGES/DRESSINGS)
BRR ADH 6X5 SEPRAFILM 1 SHT (MISCELLANEOUS) ×2
C-ARM IMAGE DRAPE ×1 IMPLANT
CABLE HIGH FREQUENCY MONO STRZ (ELECTRODE) ×2 IMPLANT
CATH FOLEY 3WAY  5CC 16FR (CATHETERS)
CATH FOLEY 3WAY 5CC 16FR (CATHETERS) ×1 IMPLANT
CATH ROBINSON RED A/P 16FR (CATHETERS) ×1 IMPLANT
CHLORAPREP W/TINT 26ML (MISCELLANEOUS) ×1 IMPLANT
CLOTH BEACON ORANGE TIMEOUT ST (SAFETY) ×1 IMPLANT
CONT PATH 16OZ SNAP LID 3702 (MISCELLANEOUS) ×2 IMPLANT
CONT SPEC 4OZ CLIKSEAL STRL BL (MISCELLANEOUS) ×2 IMPLANT
COUNTER NEEDLE 1200 MAGNETIC (NEEDLE) ×1 IMPLANT
COVER MAYO STAND STRL (DRAPES) ×2 IMPLANT
COVER TABLE BACK 60X90 (DRAPES) ×4 IMPLANT
COVER TIP SHEARS 8 DVNC (MISCELLANEOUS) ×1 IMPLANT
COVER TIP SHEARS 8MM DA VINCI (MISCELLANEOUS) ×1
DECANTER SPIKE VIAL GLASS SM (MISCELLANEOUS) ×3 IMPLANT
DERMABOND ADVANCED (GAUZE/BANDAGES/DRESSINGS) ×2
DERMABOND ADVANCED .7 DNX12 (GAUZE/BANDAGES/DRESSINGS) ×1 IMPLANT
DILATOR CANAL MILEX (MISCELLANEOUS) ×1 IMPLANT
DRAPE HUG U DISPOSABLE (DRAPE) ×2 IMPLANT
DRAPE LG THREE QUARTER DISP (DRAPES) ×4 IMPLANT
DRAPE MONITOR DA VINCI (DRAPE) IMPLANT
DRAPE WARM FLUID 44X44 (DRAPE) ×2 IMPLANT
DRESSING TELFA 8X3 (GAUZE/BANDAGES/DRESSINGS) ×1 IMPLANT
ELECT REM PT RETURN 9FT ADLT (ELECTROSURGICAL) ×2
ELECTRODE REM PT RTRN 9FT ADLT (ELECTROSURGICAL) ×1 IMPLANT
EVACUATOR SMOKE 8.L (FILTER) ×2 IMPLANT
GAUZE VASELINE 3X9 (GAUZE/BANDAGES/DRESSINGS) ×1 IMPLANT
GLOVE BIO SURGEON STRL SZ8 (GLOVE) ×7 IMPLANT
GLOVE BIO SURGEON STRL SZ8.5 (GLOVE) ×1 IMPLANT
GLOVE BIOGEL PI IND STRL 6.5 (GLOVE) IMPLANT
GLOVE BIOGEL PI IND STRL 7.0 (GLOVE) IMPLANT
GLOVE BIOGEL PI IND STRL 7.5 (GLOVE) IMPLANT
GLOVE BIOGEL PI IND STRL 8.5 (GLOVE) ×1 IMPLANT
GLOVE BIOGEL PI INDICATOR 6.5 (GLOVE) ×3
GLOVE BIOGEL PI INDICATOR 7.0 (GLOVE) ×2
GLOVE BIOGEL PI INDICATOR 7.5 (GLOVE) ×4
GLOVE BIOGEL PI INDICATOR 8.5 (GLOVE) ×2
GLOVE ECLIPSE 6.5 STRL STRAW (GLOVE) ×6 IMPLANT
GLOVE SURG SS PI 6.0 STRL IVOR (GLOVE) ×1 IMPLANT
GLOVE SURG SS PI 7.0 STRL IVOR (GLOVE) ×3 IMPLANT
GOWN STRL REIN XL XLG (GOWN DISPOSABLE) ×15 IMPLANT
KIT ACCESSORY DA VINCI DISP (KITS) ×1
KIT ACCESSORY DVNC DISP (KITS) ×1 IMPLANT
KIT DISP ACCESSORY 4 ARM (KITS) IMPLANT
MANIPULATOR UTERINE 4.5 ZUMI (MISCELLANEOUS) ×1 IMPLANT
NDL INSUFFLATION 14GA 120MM (NEEDLE) ×1 IMPLANT
NDL INSUFFLATION 14GA 150MM (NEEDLE) IMPLANT
NDL SPNL 22GX7 QUINCKE BK (NEEDLE) IMPLANT
NEEDLE INSUFFLATION 14GA 120MM (NEEDLE) ×2 IMPLANT
NEEDLE INSUFFLATION 14GA 150MM (NEEDLE) ×2 IMPLANT
NEEDLE SPNL 22GX7 QUINCKE BK (NEEDLE) ×2 IMPLANT
NS IRRIG 1000ML POUR BTL (IV SOLUTION) ×6 IMPLANT
OCCLUDER COLPOPNEUMO (BALLOONS) ×1 IMPLANT
PACK LAVH (CUSTOM PROCEDURE TRAY) ×2 IMPLANT
PAD OB MATERNITY 4.3X12.25 (PERSONAL CARE ITEMS) ×1 IMPLANT
PAD PREP 24X48 CUFFED NSTRL (MISCELLANEOUS) ×4 IMPLANT
PLUG CATH AND CAP STER (CATHETERS) ×1 IMPLANT
PROTECTOR NERVE ULNAR (MISCELLANEOUS) ×4 IMPLANT
SCISSORS LAP 5X35 DISP (ENDOMECHANICALS) ×1 IMPLANT
SEPRAFILM MEMBRANE 5X6 (MISCELLANEOUS) ×2 IMPLANT
SET CYSTO W/LG BORE CLAMP LF (SET/KITS/TRAYS/PACK) IMPLANT
SET IRRIG TUBING LAPAROSCOPIC (IRRIGATION / IRRIGATOR) ×2 IMPLANT
SOLUTION ELECTROLUBE (MISCELLANEOUS) ×2 IMPLANT
SPONGE GAUZE 4X4 12PLY (GAUZE/BANDAGES/DRESSINGS) ×1 IMPLANT
SPONGE LAP 18X18 X RAY DECT (DISPOSABLE) IMPLANT
SUT PDS AB 4-0 SH 27 (SUTURE) IMPLANT
SUT VICRYL 0 UR6 27IN ABS (SUTURE) ×2 IMPLANT
SUT VLOC 180 0 9IN  GS21 (SUTURE) ×2
SUT VLOC 180 0 9IN GS21 (SUTURE) IMPLANT
SYR 50ML LL SCALE MARK (SYRINGE) ×2 IMPLANT
SYR TB 1ML 25GX5/8 (SYRINGE) ×1 IMPLANT
SYR TOOMEY 50ML (SYRINGE) ×2 IMPLANT
SYSTEM CONVERTIBLE TROCAR (TROCAR) IMPLANT
TIP UTERINE 5.1X6CM LAV DISP (MISCELLANEOUS) IMPLANT
TIP UTERINE 6.7X10CM GRN DISP (MISCELLANEOUS) IMPLANT
TIP UTERINE 6.7X6CM WHT DISP (MISCELLANEOUS) IMPLANT
TIP UTERINE 6.7X8CM BLUE DISP (MISCELLANEOUS) IMPLANT
TOWEL OR 17X24 6PK STRL BLUE (TOWEL DISPOSABLE) ×7 IMPLANT
TRAY FOLEY CATH 16FR SILVER (SET/KITS/TRAYS/PACK) ×1 IMPLANT
TROCAR 12M 150ML BLUNT (TROCAR) ×3 IMPLANT
TROCAR DISP BLADELESS 8 DVNC (TROCAR) ×1 IMPLANT
TROCAR DISP BLADELESS 8MM (TROCAR) ×1
TROCAR XCEL NON-BLD 11X100MML (ENDOMECHANICALS) IMPLANT
TROCAR Z-THREAD 12X150 (TROCAR) ×2 IMPLANT
TROCAR Z-THREAD FIOS 12X100MM (TROCAR) ×2 IMPLANT
TUBING FILTER THERMOFLATOR (ELECTROSURGICAL) ×2 IMPLANT
VLOC ×2 IMPLANT
WARMER LAPAROSCOPE (MISCELLANEOUS) ×2 IMPLANT

## 2012-05-23 NOTE — Anesthesia Preprocedure Evaluation (Addendum)
Anesthesia Evaluation  Patient identified by MRN, date of birth, ID band Patient awake    Reviewed: Allergy & Precautions, H&P , Patient's Chart, lab work & pertinent test results, reviewed documented beta blocker date and time   Airway Mallampati: I TM Distance: >3 FB Neck ROM: full    Dental No notable dental hx.    Pulmonary  breath sounds clear to auscultation  Pulmonary exam normal       Cardiovascular Rhythm:regular Rate:Normal     Neuro/Psych    GI/Hepatic   Endo/Other  Morbid obesity  Renal/GU      Musculoskeletal   Abdominal   Peds  Hematology   Anesthesia Other Findings   Reproductive/Obstetrics                           Anesthesia Physical Anesthesia Plan  ASA: III  Anesthesia Plan: General   Post-op Pain Management:    Induction: Intravenous  Airway Management Planned: Oral ETT  Additional Equipment:   Intra-op Plan:   Post-operative Plan:   Informed Consent: I have reviewed the patients History and Physical, chart, labs and discussed the procedure including the risks, benefits and alternatives for the proposed anesthesia with the patient or authorized representative who has indicated his/her understanding and acceptance.   Dental Advisory Given and Dental advisory given  Plan Discussed with: CRNA and Surgeon  Anesthesia Plan Comments: (  Discussed  general anesthesia, including possible nausea, instrumentation of airway, sore throat,pulmonary aspiration, etc. I asked if the were any outstanding questions, or  concerns before we proceeded. )        Anesthesia Quick Evaluation

## 2012-05-23 NOTE — Preoperative (Signed)
Beta Blockers   Reason not to administer Beta Blockers:Not Applicable 

## 2012-05-23 NOTE — H&P (Signed)
History and Physical Interval Note:  05/23/2012 7:36 AM  Stacey Huerta  has presented today for surgery, with the diagnosis of recurent uterine myoma  The various methods of treatment have been discussed with the patient and family. After consideration of risks, benefits and other options for treatment, the patient has consented to  Procedure(s) (LRB): ROBOTIC ASSISTED LAPAROSCOPIC LYSIS OF ADHESION (N/A) MYOMECTOMY (N/A) as a surgical intervention .  The patient's history has been reviewed, patient examined, no change in status, stable for surgery.  I have reviewed the patient's chart and labs.  Questions were answered to the patient's satisfaction.     Fermin Schwab

## 2012-05-23 NOTE — Op Note (Signed)
OPERATIVE NOTE  Preoperative diagnosis: Recurrent uterine myomas, right tubal proximal occlusion, pelvic pain and menorrhagia  Postoperative diagnosis: Recurrent uterine myomas, I lateral tubal proximal occlusion, pelvic pain and menorrhagia  Procedure: Laparoscopy, robotic assisted CO2 laser myomectomy, excision and ablation of pelvic endometriosis, attempted fluoroscopic tubal catheterization, bilateral. Next  Anesthesia: Gen. endotracheal  Surgeon: Fermin Schwab, MD  Assistant: Catalina Antigua, M.D.  Complications: None  Estimated blood loss: 50 mL  Specimens: Myoma specimens, peritoneal excisional biopsies to pathology.  Findings: On exam under anesthesia and during laparoscopy, the cervical canal was elongated and deviated to the right and tortuous because of the intra-cervical nabothian cyst. In addition uterine canal made a diffuse angle to the left, making uterine cannulation difficult. On fluoroscopy, the hysterogram phase of the study appeared normal. There was bilateral proximal tubal occlusion. On laparoscopy, liver edge and diaphragm were within normal limits. There was a 4 x 3 cm posterior right transmural myoma, with an adjacent 1 cm intramural myoma. There were 2 anterior subserosal myomas at the uterovesical junction. These measured 1-3 cm. Otherwise the uterine serosa appeared normal. Both fallopian tubes looked externally normal both proximally and distally, with fimbria 5 out of 5. As mentioned above the tubes did not fill with contrast material. Both ovaries appeared normal. There were abnormal capillaries surrounding clear lesions on the left ovarian fossa there was one area of stellate fibrosis. Posterior cul-de-sac contained similar abnormal capillaries suggestive of endometriosis lateral to the right uterosacral ligament there were brown lesions surrounded by fibrosis, these were excised. There were also superficial peritoneal defects surrounding the right  uterosacral ligament at its insertion to the uterus.  Description of the procedure: Patient was placed in lithotomy position and general endotracheal anesthesia was given. Gentamicin and clindamycin were given intravenously for prophylaxis as the patient had penicillin allergy. She was prepped and draped in sterile manner. A Foley catheter was inserted into the bladder appeared . First I attempted to place a uterine manipulator, but, because of the tortuosity of the cervical canal, I used a sponge on a stick in the vagina to manipulate the uterus.  An operative field was created on the abdomen and the surgeon was regloved. After preemptive anesthesia with quarter percent bupivacaine and 1 200,000 epinephrine, and intraumbilical skin incision was made at the previous scar. It varies needle was inserted and pneumoperitoneum was created with carbon dioxide. A 12 mm trocar was placed at this incision. Robotic laparoscope with 3-D camera was inserted and, under direct visualization, 2 left lateral 8mm incisions were were made and corresponding robotic trochars were placed. On the right side a third robotic trocar was placed as well as a 12 mm assistant port. The da Vinci SI robot was docked to the patient after placing her in Trendelenburg position. The surgeon continued the rest of the procedure from the surgical console.  A dilute vasopressin solution (0.33 units per milliliter) was injected transcutaneously into the myometrium of the uterus. Using Omni Guide CO2 laser through a fiber, Incisions were made on the multiple myomas, and myomas were dissected using blunt and CO2 laser dissection. Hemostasis was insured. The transmural myoma defect was closed 3 layers the first and second layers were a 0 V-lock continuous suture and the third layer was a 3-0 V-lock continuous suture. The subserosal myoma defects were closed in 2 layers with 0 and 3-0 V.-lock sutures.   Using a setting of 12 W, CO2 laser was used to  excise the fibrotic lesions of endometriosis and  also ablated the superficial lesions such as capillary abnormalities and the superficial peritoneal defects that surrounded the insertion of the right uterosacral ligament.  Pelvis was copiously irrigated with lactated Ringer solution and a slurry of Seprafilm (2 sheets in 40 mL of lactated Ringer solution) was instilled into the pelvis as an adhesion barrier. The umbilical incision and the 12 mm assistant port incisions were closed with 0 Vicryl figure-of-eight sutures. The skin incisions were approximated with Dermabond.  The surgeon then attempted the fluoroscopic tubal recannulization in the following manner: Os finder was used to find and dilate the internal cervical os. A Cook cervical access catheter was advanced only into the mid cervical canal and its balloon was inflated with 1-1/2 cc of liquid. A coaxial ostial access catheter was then passed under fluoroscopic guidance with some difficulty and hysterography was performed. It showed elongated cervical canal and a normal endometrial cavity but bilateral proximal tubal occlusion was noted. A cornual access catheter with a guidewire was then guided into each tubal ostium and an attempt was made to recanalize the tubes. Due to the long and tortuous axis of the uterine canal the catheter could not be angled to make canalization of the interstitial portion of the tube possible. A selective salpingography was attempted but tubes did not opacify. At this point the procedure was terminated hemostasis was insured. Instrument and lap pad count was correct.  The patient tolerated the procedure well and was transferred to recovery room in satisfactory condition. I will reattempt a fluoroscopic tubal catheterization in radiology 3 months postoperatively.   Special Note: Due to to significant myometrial incisions, the patient is recommended to have cesarean delivery with future pregnancies.

## 2012-05-23 NOTE — Anesthesia Procedure Notes (Signed)
Procedures

## 2012-05-23 NOTE — Transfer of Care (Signed)
Immediate Anesthesia Transfer of Care Note  Patient: Stacey Huerta  Procedure(s) Performed: Procedure(s) (LRB): ROBOTIC ASSISTED LAPAROSCOPIC LYSIS OF ADHESION (N/A) MYOMECTOMY (N/A)  Patient Location: PACU  Anesthesia Type: General  Level of Consciousness: awake, sedated and patient cooperative  Airway & Oxygen Therapy: Patient Spontanous Breathing and Patient connected to nasal cannula oxygen  Post-op Assessment: Report given to PACU RN and Post -op Vital signs reviewed and stable  Post vital signs: Reviewed and stable  Complications: No apparent anesthesia complications

## 2012-05-23 NOTE — Anesthesia Postprocedure Evaluation (Signed)
Anesthesia Post Note  Patient: Stacey Huerta  Procedure(s) Performed: Procedure(s) (LRB): ROBOTIC ASSISTED LAPAROSCOPIC LYSIS OF ADHESION (N/A) MYOMECTOMY (N/A)  Anesthesia type: General  Patient location: PACU  Post pain: Pain level controlled  Post assessment: Post-op Vital signs reviewed  Last Vitals:  Filed Vitals:   05/23/12 1211  BP: 133/59  Pulse: 89  Temp: 36.9 C  Resp: 16    Post vital signs: Reviewed  Level of consciousness: sedated  Complications: No apparent anesthesia complicationsfj

## 2012-05-23 NOTE — OR Nursing (Signed)
Procedure performed " Robotic assisted laparoscopic lysis of adhesions, myomectomy, Co2 laser ablation and excision of endometriosis, attempted bilateral tubal cathertization under fluoroscopy."

## 2012-05-26 ENCOUNTER — Encounter (HOSPITAL_COMMUNITY): Payer: Self-pay | Admitting: Obstetrics and Gynecology

## 2012-06-12 ENCOUNTER — Other Ambulatory Visit (HOSPITAL_COMMUNITY): Payer: Self-pay | Admitting: Obstetrics and Gynecology

## 2012-06-12 DIAGNOSIS — N979 Female infertility, unspecified: Secondary | ICD-10-CM

## 2012-06-20 ENCOUNTER — Ambulatory Visit (HOSPITAL_COMMUNITY)
Admission: RE | Admit: 2012-06-20 | Discharge: 2012-06-20 | Disposition: A | Discharge status: Home / Self Care | Payer: No Typology Code available for payment source | Source: Ambulatory Visit | Attending: Obstetrics and Gynecology | Admitting: Obstetrics and Gynecology

## 2012-06-20 DIAGNOSIS — N979 Female infertility, unspecified: Secondary | ICD-10-CM | POA: Insufficient documentation

## 2012-06-20 MED ORDER — IOHEXOL 300 MG/ML  SOLN: 40.0000 mL | Freq: Once | INTRAMUSCULAR | Status: AC | PRN

## 2012-07-23 ENCOUNTER — Encounter: Payer: Self-pay | Admitting: Family Medicine

## 2012-07-23 ENCOUNTER — Ambulatory Visit (INDEPENDENT_AMBULATORY_CARE_PROVIDER_SITE_OTHER): Payer: BC Managed Care – PPO | Admitting: Family Medicine

## 2012-07-23 VITALS — BP 120/82 | HR 84 | Temp 99.0°F | Ht 68.0 in | Wt 318.2 lb

## 2012-07-23 DIAGNOSIS — J309 Allergic rhinitis, unspecified: Secondary | ICD-10-CM

## 2012-07-23 DIAGNOSIS — T781XXA Other adverse food reactions, not elsewhere classified, initial encounter: Secondary | ICD-10-CM

## 2012-07-23 DIAGNOSIS — Z9109 Other allergy status, other than to drugs and biological substances: Secondary | ICD-10-CM

## 2012-07-23 DIAGNOSIS — Z0000 Encounter for general adult medical examination without abnormal findings: Secondary | ICD-10-CM

## 2012-07-23 DIAGNOSIS — Z91018 Allergy to other foods: Secondary | ICD-10-CM

## 2012-07-23 LAB — LIPID PANEL
HDL: 37.4 mg/dL — ABNORMAL LOW (ref >39.00)
LDL Cholesterol: 107 mg/dL — ABNORMAL HIGH (ref 0–99)
Total CHOL/HDL Ratio: 4
Triglycerides: 86 mg/dL (ref 0.0–149.0)

## 2012-07-23 LAB — POCT URINALYSIS DIPSTICK
Blood, UA: NEGATIVE
Nitrite, UA: NEGATIVE
Urobilinogen, UA: 0.2
pH, UA: 6

## 2012-07-23 LAB — CBC WITH DIFFERENTIAL/PLATELET
Eosinophils Absolute: 0.1 10*3/uL (ref 0.0–0.7)
Eosinophils Relative: 1.1 % (ref 0.0–5.0)
Lymphocytes Relative: 24.2 % (ref 12.0–46.0)
MCV: 95.2 fl (ref 78.0–100.0)
Monocytes Absolute: 0.2 10*3/uL (ref 0.1–1.0)
Neutrophils Relative %: 70.1 % (ref 43.0–77.0)
Platelets: 158 10*3/uL (ref 150.0–400.0)
WBC: 5.2 10*3/uL (ref 4.5–10.5)

## 2012-07-23 LAB — HEPATIC FUNCTION PANEL
ALT: 62 U/L — ABNORMAL HIGH (ref 0–35)
Alkaline Phosphatase: 71 U/L (ref 39–117)
Bilirubin, Direct: 0.2 mg/dL (ref 0.0–0.3)
Total Bilirubin: 1.5 mg/dL — ABNORMAL HIGH (ref 0.3–1.2)

## 2012-07-23 LAB — BASIC METABOLIC PANEL
BUN: 8 mg/dL (ref 6–23)
Calcium: 9.1 mg/dL (ref 8.4–10.5)
Chloride: 104 mEq/L (ref 96–112)
Creatinine, Ser: 0.9 mg/dL (ref 0.4–1.2)

## 2012-07-23 NOTE — Patient Instructions (Addendum)
Preventive Care for Adults, Female A healthy lifestyle and preventive care can promote health and wellness. Preventive health guidelines for women include the following key practices.  A routine yearly physical is a good way to check with your caregiver about your health and preventive screening. It is a chance to share any concerns and updates on your health, and to receive a thorough exam.  Visit your dentist for a routine exam and preventive care every 6 months. Brush your teeth twice a day and floss once a day. Good oral hygiene prevents tooth decay and gum disease.  The frequency of eye exams is based on your age, health, family medical history, use of contact lenses, and other factors. Follow your caregiver's recommendations for frequency of eye exams.  Eat a healthy diet. Foods like vegetables, fruits, whole grains, low-fat dairy products, and lean protein foods contain the nutrients you need without too many calories. Decrease your intake of foods high in solid fats, added sugars, and salt. Eat the right amount of calories for you.Get information about a proper diet from your caregiver, if necessary.  Regular physical exercise is one of the most important things you can do for your health. Most adults should get at least 150 minutes of moderate-intensity exercise (any activity that increases your heart rate and causes you to sweat) each week. In addition, most adults need muscle-strengthening exercises on 2 or more days a week.  Maintain a healthy weight. The body mass index (BMI) is a screening tool to identify possible weight problems. It provides an estimate of body fat based on height and weight. Your caregiver can help determine your BMI, and can help you achieve or maintain a healthy weight.For adults 20 years and older:  A BMI below 18.5 is considered underweight.  A BMI of 18.5 to 24.9 is normal.  A BMI of 25 to 29.9 is considered overweight.  A BMI of 30 and above is  considered obese.  Maintain normal blood lipids and cholesterol levels by exercising and minimizing your intake of saturated fat. Eat a balanced diet with plenty of fruit and vegetables. Blood tests for lipids and cholesterol should begin at age 20 and be repeated every 5 years. If your lipid or cholesterol levels are high, you are over 50, or you are at high risk for heart disease, you may need your cholesterol levels checked more frequently.Ongoing high lipid and cholesterol levels should be treated with medicines if diet and exercise are not effective.  If you smoke, find out from your caregiver how to quit. If you do not use tobacco, do not start.  If you are pregnant, do not drink alcohol. If you are breastfeeding, be very cautious about drinking alcohol. If you are not pregnant and choose to drink alcohol, do not exceed 1 drink per day. One drink is considered to be 12 ounces (355 mL) of beer, 5 ounces (148 mL) of wine, or 1.5 ounces (44 mL) of liquor.  Avoid use of street drugs. Do not share needles with anyone. Ask for help if you need support or instructions about stopping the use of drugs.  High blood pressure causes heart disease and increases the risk of stroke. Your blood pressure should be checked at least every 1 to 2 years. Ongoing high blood pressure should be treated with medicines if weight loss and exercise are not effective.  If you are 55 to 38 years old, ask your caregiver if you should take aspirin to prevent strokes.  Diabetes   screening involves taking a blood sample to check your fasting blood sugar level. This should be done once every 3 years, after age 45, if you are within normal weight and without risk factors for diabetes. Testing should be considered at a younger age or be carried out more frequently if you are overweight and have at least 1 risk factor for diabetes.  Breast cancer screening is essential preventive care for women. You should practice "breast  self-awareness." This means understanding the normal appearance and feel of your breasts and may include breast self-examination. Any changes detected, no matter how small, should be reported to a caregiver. Women in their 20s and 30s should have a clinical breast exam (CBE) by a caregiver as part of a regular health exam every 1 to 3 years. After age 40, women should have a CBE every year. Starting at age 40, women should consider having a mammography (breast X-ray test) every year. Women who have a family history of breast cancer should talk to their caregiver about genetic screening. Women at a high risk of breast cancer should talk to their caregivers about having magnetic resonance imaging (MRI) and a mammography every year.  The Pap test is a screening test for cervical cancer. A Pap test can show cell changes on the cervix that might become cervical cancer if left untreated. A Pap test is a procedure in which cells are obtained and examined from the lower end of the uterus (cervix).  Women should have a Pap test starting at age 21.  Between ages 21 and 29, Pap tests should be repeated every 2 years.  Beginning at age 30, you should have a Pap test every 3 years as long as the past 3 Pap tests have been normal.  Some women have medical problems that increase the chance of getting cervical cancer. Talk to your caregiver about these problems. It is especially important to talk to your caregiver if a new problem develops soon after your last Pap test. In these cases, your caregiver may recommend more frequent screening and Pap tests.  The above recommendations are the same for women who have or have not gotten the vaccine for human papillomavirus (HPV).  If you had a hysterectomy for a problem that was not cancer or a condition that could lead to cancer, then you no longer need Pap tests. Even if you no longer need a Pap test, a regular exam is a good idea to make sure no other problems are  starting.  If you are between ages 65 and 70, and you have had normal Pap tests going back 10 years, you no longer need Pap tests. Even if you no longer need a Pap test, a regular exam is a good idea to make sure no other problems are starting.  If you have had past treatment for cervical cancer or a condition that could lead to cancer, you need Pap tests and screening for cancer for at least 20 years after your treatment.  If Pap tests have been discontinued, risk factors (such as a new sexual partner) need to be reassessed to determine if screening should be resumed.  The HPV test is an additional test that may be used for cervical cancer screening. The HPV test looks for the virus that can cause the cell changes on the cervix. The cells collected during the Pap test can be tested for HPV. The HPV test could be used to screen women aged 30 years and older, and should   be used in women of any age who have unclear Pap test results. After the age of 30, women should have HPV testing at the same frequency as a Pap test.  Colorectal cancer can be detected and often prevented. Most routine colorectal cancer screening begins at the age of 50 and continues through age 75. However, your caregiver may recommend screening at an earlier age if you have risk factors for colon cancer. On a yearly basis, your caregiver may provide home test kits to check for hidden blood in the stool. Use of a small camera at the end of a tube, to directly examine the colon (sigmoidoscopy or colonoscopy), can detect the earliest forms of colorectal cancer. Talk to your caregiver about this at age 50, when routine screening begins. Direct examination of the colon should be repeated every 5 to 10 years through age 75, unless early forms of pre-cancerous polyps or small growths are found.  Hepatitis C blood testing is recommended for all people born from 1945 through 1965 and any individual with known risks for hepatitis C.  Practice  safe sex. Use condoms and avoid high-risk sexual practices to reduce the spread of sexually transmitted infections (STIs). STIs include gonorrhea, chlamydia, syphilis, trichomonas, herpes, HPV, and human immunodeficiency virus (HIV). Herpes, HIV, and HPV are viral illnesses that have no cure. They can result in disability, cancer, and death. Sexually active women aged 25 and younger should be checked for chlamydia. Older women with new or multiple partners should also be tested for chlamydia. Testing for other STIs is recommended if you are sexually active and at increased risk.  Osteoporosis is a disease in which the bones lose minerals and strength with aging. This can result in serious bone fractures. The risk of osteoporosis can be identified using a bone density scan. Women ages 65 and over and women at risk for fractures or osteoporosis should discuss screening with their caregivers. Ask your caregiver whether you should take a calcium supplement or vitamin D to reduce the rate of osteoporosis.  Menopause can be associated with physical symptoms and risks. Hormone replacement therapy is available to decrease symptoms and risks. You should talk to your caregiver about whether hormone replacement therapy is right for you.  Use sunscreen with sun protection factor (SPF) of 30 or more. Apply sunscreen liberally and repeatedly throughout the day. You should seek shade when your shadow is shorter than you. Protect yourself by wearing long sleeves, pants, a wide-brimmed hat, and sunglasses year round, whenever you are outdoors.  Once a month, do a whole body skin exam, using a mirror to look at the skin on your back. Notify your caregiver of new moles, moles that have irregular borders, moles that are larger than a pencil eraser, or moles that have changed in shape or color.  Stay current with required immunizations.  Influenza. You need a dose every fall (or winter). The composition of the flu vaccine  changes each year, so being vaccinated once is not enough.  Pneumococcal polysaccharide. You need 1 to 2 doses if you smoke cigarettes or if you have certain chronic medical conditions. You need 1 dose at age 65 (or older) if you have never been vaccinated.  Tetanus, diphtheria, pertussis (Tdap, Td). Get 1 dose of Tdap vaccine if you are younger than age 65, are over 65 and have contact with an infant, are a healthcare worker, are pregnant, or simply want to be protected from whooping cough. After that, you need a Td   booster dose every 10 years. Consult your caregiver if you have not had at least 3 tetanus and diphtheria-containing shots sometime in your life or have a deep or dirty wound.  HPV. You need this vaccine if you are a woman age 26 or younger. The vaccine is given in 3 doses over 6 months.  Measles, mumps, rubella (MMR). You need at least 1 dose of MMR if you were born in 1957 or later. You may also need a second dose.  Meningococcal. If you are age 19 to 21 and a first-year college student living in a residence hall, or have one of several medical conditions, you need to get vaccinated against meningococcal disease. You may also need additional booster doses.  Zoster (shingles). If you are age 60 or older, you should get this vaccine.  Varicella (chickenpox). If you have never had chickenpox or you were vaccinated but received only 1 dose, talk to your caregiver to find out if you need this vaccine.  Hepatitis A. You need this vaccine if you have a specific risk factor for hepatitis A virus infection or you simply wish to be protected from this disease. The vaccine is usually given as 2 doses, 6 to 18 months apart.  Hepatitis B. You need this vaccine if you have a specific risk factor for hepatitis B virus infection or you simply wish to be protected from this disease. The vaccine is given in 3 doses, usually over 6 months. Preventive Services / Frequency Ages 19 to 39  Blood  pressure check.** / Every 1 to 2 years.  Lipid and cholesterol check.** / Every 5 years beginning at age 20.  Clinical breast exam.** / Every 3 years for women in their 20s and 30s.  Pap test.** / Every 2 years from ages 21 through 29. Every 3 years starting at age 30 through age 65 or 70 with a history of 3 consecutive normal Pap tests.  HPV screening.** / Every 3 years from ages 30 through ages 65 to 70 with a history of 3 consecutive normal Pap tests.  Hepatitis C blood test.** / For any individual with known risks for hepatitis C.  Skin self-exam. / Monthly.  Influenza immunization.** / Every year.  Pneumococcal polysaccharide immunization.** / 1 to 2 doses if you smoke cigarettes or if you have certain chronic medical conditions.  Tetanus, diphtheria, pertussis (Tdap, Td) immunization. / A one-time dose of Tdap vaccine. After that, you need a Td booster dose every 10 years.  HPV immunization. / 3 doses over 6 months, if you are 26 and younger.  Measles, mumps, rubella (MMR) immunization. / You need at least 1 dose of MMR if you were born in 1957 or later. You may also need a second dose.  Meningococcal immunization. / 1 dose if you are age 19 to 21 and a first-year college student living in a residence hall, or have one of several medical conditions, you need to get vaccinated against meningococcal disease. You may also need additional booster doses.  Varicella immunization.** / Consult your caregiver.  Hepatitis A immunization.** / Consult your caregiver. 2 doses, 6 to 18 months apart.  Hepatitis B immunization.** / Consult your caregiver. 3 doses usually over 6 months. Ages 40 to 64  Blood pressure check.** / Every 1 to 2 years.  Lipid and cholesterol check.** / Every 5 years beginning at age 20.  Clinical breast exam.** / Every year after age 40.  Mammogram.** / Every year beginning at age 40   and continuing for as long as you are in good health. Consult with your  caregiver.  Pap test.** / Every 3 years starting at age 30 through age 65 or 70 with a history of 3 consecutive normal Pap tests.  HPV screening.** / Every 3 years from ages 30 through ages 65 to 70 with a history of 3 consecutive normal Pap tests.  Fecal occult blood test (FOBT) of stool. / Every year beginning at age 50 and continuing until age 75. You may not need to do this test if you get a colonoscopy every 10 years.  Flexible sigmoidoscopy or colonoscopy.** / Every 5 years for a flexible sigmoidoscopy or every 10 years for a colonoscopy beginning at age 50 and continuing until age 75.  Hepatitis C blood test.** / For all people born from 1945 through 1965 and any individual with known risks for hepatitis C.  Skin self-exam. / Monthly.  Influenza immunization.** / Every year.  Pneumococcal polysaccharide immunization.** / 1 to 2 doses if you smoke cigarettes or if you have certain chronic medical conditions.  Tetanus, diphtheria, pertussis (Tdap, Td) immunization.** / A one-time dose of Tdap vaccine. After that, you need a Td booster dose every 10 years.  Measles, mumps, rubella (MMR) immunization. / You need at least 1 dose of MMR if you were born in 1957 or later. You may also need a second dose.  Varicella immunization.** / Consult your caregiver.  Meningococcal immunization.** / Consult your caregiver.  Hepatitis A immunization.** / Consult your caregiver. 2 doses, 6 to 18 months apart.  Hepatitis B immunization.** / Consult your caregiver. 3 doses, usually over 6 months. Ages 65 and over  Blood pressure check.** / Every 1 to 2 years.  Lipid and cholesterol check.** / Every 5 years beginning at age 20.  Clinical breast exam.** / Every year after age 40.  Mammogram.** / Every year beginning at age 40 and continuing for as long as you are in good health. Consult with your caregiver.  Pap test.** / Every 3 years starting at age 30 through age 65 or 70 with a 3  consecutive normal Pap tests. Testing can be stopped between 65 and 70 with 3 consecutive normal Pap tests and no abnormal Pap or HPV tests in the past 10 years.  HPV screening.** / Every 3 years from ages 30 through ages 65 or 70 with a history of 3 consecutive normal Pap tests. Testing can be stopped between 65 and 70 with 3 consecutive normal Pap tests and no abnormal Pap or HPV tests in the past 10 years.  Fecal occult blood test (FOBT) of stool. / Every year beginning at age 50 and continuing until age 75. You may not need to do this test if you get a colonoscopy every 10 years.  Flexible sigmoidoscopy or colonoscopy.** / Every 5 years for a flexible sigmoidoscopy or every 10 years for a colonoscopy beginning at age 50 and continuing until age 75.  Hepatitis C blood test.** / For all people born from 1945 through 1965 and any individual with known risks for hepatitis C.  Osteoporosis screening.** / A one-time screening for women ages 65 and over and women at risk for fractures or osteoporosis.  Skin self-exam. / Monthly.  Influenza immunization.** / Every year.  Pneumococcal polysaccharide immunization.** / 1 dose at age 65 (or older) if you have never been vaccinated.  Tetanus, diphtheria, pertussis (Tdap, Td) immunization. / A one-time dose of Tdap vaccine if you are over   65 and have contact with an infant, are a healthcare worker, or simply want to be protected from whooping cough. After that, you need a Td booster dose every 10 years.  Varicella immunization.** / Consult your caregiver.  Meningococcal immunization.** / Consult your caregiver.  Hepatitis A immunization.** / Consult your caregiver. 2 doses, 6 to 18 months apart.  Hepatitis B immunization.** / Check with your caregiver. 3 doses, usually over 6 months. ** Family history and personal history of risk and conditions may change your caregiver's recommendations. Document Released: 12/04/2001 Document Revised: 12/31/2011  Document Reviewed: 03/05/2011 [employer] Patient Information 2013 [employer].  

## 2012-07-23 NOTE — Progress Notes (Signed)
Subjective:     Stacey Huerta is a 36 y.o. female and is here for a comprehensive physical exam. The patient reports no problems.  History   Social History  . Marital Status: Divorced    Spouse Name: N/A    Number of Children: N/A  . Years of Education: N/A   Occupational History  . Not on file.   Social History Main Topics  . Smoking status: Never Smoker   . Smokeless tobacco: Not on file  . Alcohol Use: Yes     socially  . Drug Use: No  . Sexually Active: Yes -- Female partner(s)    Birth Control/ Protection: None   Other Topics Concern  . Not on file   Social History Narrative   Nurse, adult 3 days a week   Health Maintenance  Topic Date Due  . Influenza Vaccine  06/22/2012  . Pap Smear  12/21/2014  . Tetanus/tdap  05/06/2017    The following portions of the patient's history were reviewed and updated as appropriate:  She  has a past medical history of Headache. She  does not have any pertinent problems on file. She  has past surgical history that includes Cholecystectomy; Myomectomy; Myomectomy (05/23/2012); Lymphadenectomy; Abdominal adhesion surgery; and Endometrial ablation. Her family history includes Arthritis in her paternal aunt; Breast cancer in her maternal aunt; Colon cancer in her paternal grandmother; Diabetes in her father and paternal grandfather; Hyperlipidemia in her maternal aunts and mother; Hypertension in her paternal grandfather; Hypotension in her father; and Prostate cancer in her maternal grandfather. She  reports that she has never smoked. She does not have any smokeless tobacco history on file. She reports that she drinks alcohol. She reports that she does not use illicit drugs. She has a current medication list which includes the following prescription(s): ibuprofen and mefenamic acid. Current Outpatient Prescriptions on File Prior to Visit  Medication Sig Dispense Refill  . ibuprofen (ADVIL,MOTRIN) 800 MG tablet Take 800 mg by  mouth every 6 (six) hours as needed. For inflammation from root canal      . Mefenamic Acid (PONSTEL) 250 MG CAPS Take 2 tablets po STAT;then 1 tablet po every 6 hours.  30 each  0   She is allergic to penicillins; shellfish allergy; and sulfonamide derivatives..  Review of Systems Review of Systems  Constitutional: Negative for activity change, appetite change and fatigue.  HENT: Negative for hearing loss, congestion, tinnitus and ear discharge.  dentist q17m Eyes: Negative for visual disturbance (see optho q1y -- vision corrected to 20/20 with glasses).  Respiratory: Negative for cough, chest tightness and shortness of breath.   Cardiovascular: Negative for chest pain, palpitations and leg swelling.  Gastrointestinal: Negative for abdominal pain, diarrhea, constipation and abdominal distention.  Genitourinary: Negative for urgency, frequency, decreased urine volume and difficulty urinating.  Musculoskeletal: Negative for back pain, arthralgias and gait problem.  Skin: Negative for color change, pallor and rash.  Neurological: Negative for dizziness, light-headedness, numbness and headaches.  Hematological: Negative for adenopathy. Does not bruise/bleed easily.  Psychiatric/Behavioral: Negative for suicidal ideas, confusion, sleep disturbance, self-injury, dysphoric mood, decreased concentration and agitation.       Objective:    BP 120/82  Pulse 84  Temp 99 F (37.2 C) (Oral)  Ht 5\' 8"  (1.727 m)  Wt 318 lb 3.2 oz (144.335 kg)  BMI 48.38 kg/m2  SpO2 96% General appearance: alert, cooperative, appears stated age and no distress Head: Normocephalic, without obvious abnormality, atraumatic Eyes: conjunctivae/corneas clear.  PERRL, EOM's intact. Fundi benign. Ears: normal TM's and external ear canals both ears Nose: Nares normal. Septum midline. Mucosa normal. No drainage or sinus tenderness. Throat: lips, mucosa, and tongue normal; teeth and gums normal Neck: no adenopathy, no  carotid bruit, no JVD, supple, symmetrical, trachea midline and thyroid not enlarged, symmetric, no tenderness/mass/nodules Back: symmetric, no curvature. ROM normal. No CVA tenderness. Lungs: clear to auscultation bilaterally Breasts: gyn Heart: regular rate and rhythm, S1, S2 normal, no murmur, click, rub or gallop Abdomen: soft, non-tender; bowel sounds normal; no masses,  no organomegaly Pelvic: deferred-gyn Extremities: extremities normal, atraumatic, no cyanosis or edema Pulses: 2+ and symmetric Skin: Skin color, texture, turgor normal. No rashes or lesions Lymph nodes: Cervical, supraclavicular, and axillary nodes normal. Neurologic: Alert and oriented X 3, normal strength and tone. Normal symmetric reflexes. Normal coordination and gait psych-- no anxiety , no depression    Assessment:    Healthy female exam.     environmental and food allergies Plan:    ghm utd Check labs See After Visit Summary for Counseling Recommendations

## 2012-07-25 LAB — ~~LOC~~ ALLERGY PANEL
Allergen, Cedar tree, t12: <0.1 kU/L
Bahia Grass: 0.12 kU/L — ABNORMAL HIGH
Bermuda Grass: 2.15 kU/L — ABNORMAL HIGH
Box Elder IgE: <0.1 kU/L
Cockroach: 2.32 kU/L — ABNORMAL HIGH
Common Ragweed: <0.1 kU/L
Elm IgE: <0.1 kU/L
Johnson Grass: <0.1 kU/L
Mucor Racemosus: <0.1 kU/L
Nettle: 0.22 kU/L — ABNORMAL HIGH
Pecan/Hickory Tree IgE: <0.1 kU/L
Sheep Sorrel IgE: <0.1 kU/L
Timothy Grass: <0.1 kU/L

## 2012-07-29 ENCOUNTER — Other Ambulatory Visit: Payer: Self-pay | Admitting: Family Medicine

## 2012-07-29 ENCOUNTER — Telehealth: Payer: Self-pay | Admitting: Family Medicine

## 2012-07-29 DIAGNOSIS — Z9109 Other allergy status, other than to drugs and biological substances: Secondary | ICD-10-CM

## 2012-07-29 NOTE — Telephone Encounter (Signed)
Please advise      KP 

## 2012-07-29 NOTE — Telephone Encounter (Signed)
It was released--not sure why its not showing Referral put in

## 2012-07-29 NOTE — Telephone Encounter (Signed)
requesting referral for Allergist, also noted IgG test is not showing in my chart, can it be uploaded again?

## 2012-08-11 ENCOUNTER — Other Ambulatory Visit (INDEPENDENT_AMBULATORY_CARE_PROVIDER_SITE_OTHER): Payer: BC Managed Care – PPO

## 2012-08-11 LAB — HEPATIC FUNCTION PANEL
AST: 40 U/L — ABNORMAL HIGH (ref 0–37)
Total Bilirubin: 1.2 mg/dL (ref 0.3–1.2)

## 2012-08-11 LAB — GAMMA GT: GGT: 20 U/L (ref 7–51)

## 2012-08-12 LAB — HEPATITIS PANEL, ACUTE
HCV Ab: NEGATIVE
Hep A IgM: NEGATIVE
Hep B C IgM: NEGATIVE
Hepatitis B Surface Ag: NEGATIVE

## 2012-08-18 LAB — IGG FOOD PANEL
Chicken, IgG: <0.15 ug/mL (ref <0.15)
Egg yolk, IgG: 3.1 ug/mL — ABNORMAL HIGH (ref <2.0)

## 2012-08-20 ENCOUNTER — Telehealth: Payer: Self-pay

## 2012-08-20 NOTE — Telephone Encounter (Signed)
Pt stated confused reading results on mychart need clarity on allergies. I advised pt of the ones that were high and I mailed results out to pt. Because they were more clear.  Pt stated understanding.     MW

## 2012-09-15 ENCOUNTER — Encounter: Payer: Self-pay | Admitting: Internal Medicine

## 2012-09-15 ENCOUNTER — Ambulatory Visit (INDEPENDENT_AMBULATORY_CARE_PROVIDER_SITE_OTHER): Payer: BC Managed Care – PPO | Admitting: Internal Medicine

## 2012-09-15 ENCOUNTER — Other Ambulatory Visit (INDEPENDENT_AMBULATORY_CARE_PROVIDER_SITE_OTHER): Payer: BC Managed Care – PPO

## 2012-09-15 VITALS — BP 108/62 | HR 92 | Ht 70.0 in | Wt 324.8 lb

## 2012-09-15 DIAGNOSIS — T781XXA Other adverse food reactions, not elsewhere classified, initial encounter: Secondary | ICD-10-CM

## 2012-09-15 DIAGNOSIS — Z91018 Allergy to other foods: Secondary | ICD-10-CM

## 2012-09-15 DIAGNOSIS — L500 Allergic urticaria: Secondary | ICD-10-CM

## 2012-09-15 DIAGNOSIS — R21 Rash and other nonspecific skin eruption: Secondary | ICD-10-CM

## 2012-09-15 MED ORDER — EPINEPHRINE 0.3 MG/0.3ML IJ DEVI: INTRAMUSCULAR | Status: AC

## 2012-09-15 NOTE — Progress Notes (Signed)
09/15/12- 36 yoF never smoker referred courtesy of Dr Laury Axon for allergy evaluation. She complains of swelling and itching irregularly over the last 2 months. There has been 5 separate episodes, each at night, starting in early October. She woke with facial swelling and small bumps which aged. No swelling of tongue or throat and no wheezing. Benadryl helped. She had no joint pain, fever or swelling otherwise. She shows me pictures of marked facial urticaria. She reports known food allergy since childhood to egg, [place] nuts, seafood and mushrooms.. These cause itching and swelling. Seasonal allergic rhinitis as a child in the [place] area. Because of egg intolerance she avoids flu shot. She has taken no Benadryl in November but still notices that her lips seem to itch a little when she wakes in the morning. She has to be selective about which makeups she uses. Family history the father had hives once. She reports medication allergy to penicillin and sulfa but no problems from latex, contrast dye or aspirin. She is trying to get pregnant Allergy Profile 07/23/12- specific elevations for dust mite, dog dander, cockroach and some weed pollens. Food IgG profile- elevated to all agents tested, as has been my experience with this test.  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 Lives in a house, no basement, central air conditioning, no mold, has a dog. Boyfriend smokes outside.  Prior to Admission medications   Medication Sig Start Date End Date Taking? Authorizing Provider  Docosahexaenoic Acid (DHA COMPLETE) 200 MG CAPS Take 1 capsule by mouth every other day.   Yes Historical Provider, MD  Mefenamic Acid (PONSTEL) 250 MG CAPS Take 2 tablets po STAT;then 1 tablet po every 6 hours. 02/14/12  Yes Hal Morales, MD  EPINEPHrine (EPI-PEN) 0.3 mg/0.3 mL DEVI For severe allergic reaction 09/15/12 09/15/13  Waymon Budge, MD   Past Medical History  Diagnosis Date  . Headache    Past Surgical History    Procedure Date  . Cholecystectomy   . Myomectomy   . Myomectomy 05/23/2012    Procedure: MYOMECTOMY;  Surgeon: Fermin Schwab, MD;  Location: [facility];  Service: Gynecology;  Laterality: N/A;  . Lymphadenectomy     Removed from the neck at age 36  . Abdominal adhesion surgery   . Endometrial ablation    Family History  Problem Relation Age of Onset  . Hypertension Paternal Grandfather   . Diabetes Paternal Grandfather   . Diabetes Father   . Hypotension Father   . Colon cancer Paternal Grandmother   . Breast cancer Maternal Aunt   . Prostate cancer Maternal Grandfather   . Arthritis Paternal Aunt   . Hyperlipidemia Mother   . Hyperlipidemia Maternal Aunt     x's 2   \ History   Social History  . Marital Status: Divorced    Spouse Name: N/A    Number of Children: N/A  . Years of Education: N/A   Occupational History  . Not on file.   Social History Main Topics  . Smoking status: Never Smoker   . Smokeless tobacco: Not on file  . Alcohol Use: Yes     Comment: socially  . Drug Use: No  . Sexually Active: Yes -- Female partner(s)    Birth Control/ Protection: None   Other Topics Concern  . Not on file   Social History Narrative   Nurse, adult 3 days a week   ROS-see HPI Constitutional:   No-   weight loss, night sweats, fevers, chills, fatigue,  lassitude. HEENT:   No-  headaches, difficulty swallowing, tooth/dental problems, sore throat,       + sneezing, itching, ear ache, nasal congestion, post nasal drip,  CV:  No-   chest pain, orthopnea, PND, swelling in lower extremities, anasarca,  dizziness, palpitations Resp: No-   shortness of breath with exertion or at rest.              No-   productive cough,  No non-productive cough,  No- coughing up of blood.              No-   change in color of mucus.  No- wheezing.   Skin: + Per history of present illness-   rash or lesions. GI:  No-   heartburn, indigestion, abdominal pain, nausea, vomiting,  diarrhea,                 change in bowel habits, loss of appetite GU: No-   dysuria, change in color of urine, no urgency or frequency.  No- flank pain. MS:  No-   joint pain or swelling.  No- decreased range of motion.  No- back pain. Neuro-     nothing unusual Psych:  No- change in mood or affect. No depression or anxiety.  No memory loss.  OBJ- Physical Exam General- Alert, Oriented, Affect-appropriate, Distress- none acute. Overweight Skin- rash-none, lesions- none, excoriation- none. Looks clear today. Lymphadenopathy- none Head- atraumatic            Eyes- Gross vision intact, PERRLA, conjunctivae and secretions clear            Ears- Hearing, canals-normal            Nose- Clear, no-Septal dev, mucus, polyps, erosion, perforation             Throat- Mallampati III , mucosa clear , drainage- none, tonsils- atrophic Neck- flexible , trachea midline, no stridor , thyroid nl, carotid no bruit Chest - symmetrical excursion , unlabored           Heart/CV- RRR , no murmur , no gallop  , no rub, nl s1 s2                           - JVD- none , edema- none, stasis changes- none, varices- none           Lung- clear to P&A, wheeze- none, cough- none , dullness-none, rub- none           Chest wall-  Abd- tender-no, distended-no, bowel sounds-present, HSM- no Br/ Gen/ Rectal- Not done, not indicated Extrem- cyanosis- none, clubbing, none, atrophy- none, strength- nl Neuro- grossly intact to observation

## 2012-09-15 NOTE — Patient Instructions (Addendum)
Continue to avoid those foods that definitely cause you trouble  You can pre-treat with an antihistamine an hour or so ahead if you are concerned you might get exposed to a trigger accidentally. Allegra and Claritin are non-sedating.  Script sent for Epipen.       Epipen instruction  Order lab- Food IgE profile- dx food allergy                  Alpha-gal  Dx food allergy                  Sed rate, ANA     Dx rash

## 2012-09-16 LAB — ANTI-NUCLEAR AB-TITER (ANA TITER): ANA Titer 1: 1:160 {titer} — ABNORMAL HIGH

## 2012-09-16 LAB — ALLERGEN FOOD PROFILE SPECIFIC IGE
Apple: 0.71 kU/L — ABNORMAL HIGH
Chicken IgE: 1.08 kU/L — ABNORMAL HIGH
Corn: <0.1 kU/L
Fish Cod: 2.8 kU/L — ABNORMAL HIGH
Orange: 0.22 kU/L — ABNORMAL HIGH
Tomato IgE: <0.1 kU/L

## 2012-09-26 ENCOUNTER — Telehealth: Payer: Self-pay | Admitting: Internal Medicine

## 2012-09-26 NOTE — Telephone Encounter (Signed)
Pt notified of lab results

## 2012-09-26 NOTE — Progress Notes (Signed)
Quick Note:  LMTCB ______ 

## 2012-09-28 DIAGNOSIS — L500 Allergic urticaria: Secondary | ICD-10-CM

## 2012-09-28 HISTORY — DX: Allergic urticaria: L50.0

## 2012-09-28 NOTE — Assessment & Plan Note (Signed)
Food reaction is certainly a possibility but other explanations are not ruled out. We need to see what her IgE antibodies look like. We look for meat allergy. Plan-food profile IgE, sed rate, ANA, IgE to alpha- gal

## 2012-10-30 ENCOUNTER — Ambulatory Visit (INDEPENDENT_AMBULATORY_CARE_PROVIDER_SITE_OTHER): Payer: BC Managed Care – PPO | Admitting: Internal Medicine

## 2012-10-30 ENCOUNTER — Encounter: Payer: Self-pay | Admitting: Internal Medicine

## 2012-10-30 VITALS — BP 110/78 | HR 97 | Ht 70.0 in | Wt 320.4 lb

## 2012-10-30 DIAGNOSIS — L500 Allergic urticaria: Secondary | ICD-10-CM

## 2012-10-30 NOTE — Patient Instructions (Addendum)
Be extra careful if eating foods that measured higher IgE antibody levels on our tests, as discussed. If a food clearly causes problems, don't eat it. It might help a little sometimes to take an antihistamine like loratadine, fexofenadine, cetirizine or benadryl an hour or more before going to eat where you can't tell what is in the food.   You should talk with your doctors about the ANA level  Please call as needed

## 2012-10-30 NOTE — Progress Notes (Signed)
09/15/12- 18 yoF never smoker referred courtesy of Dr Laury Axon for allergy evaluation. She complains of swelling and itching irregularly over the last 2 months. There has been 5 separate episodes, each at night, starting in early October. She woke with facial swelling and small bumps which aged. No swelling of tongue or throat and no wheezing. Benadryl helped. She had no joint pain, fever or swelling otherwise. She shows me pictures of marked facial urticaria. She reports known food allergy since childhood to egg, [place] nuts, seafood and mushrooms.. These cause itching and swelling. Seasonal allergic rhinitis as a child in the [place] area. Because of egg intolerance she avoids flu shot. She has taken no Benadryl in November but still notices that her lips seem to itch a little when she wakes in the morning. She has to be selective about which makeups she uses. Family history the father had hives once. She reports medication allergy to penicillin and sulfa but no problems from latex, contrast dye or aspirin. She is trying to get pregnant Allergy Profile 07/23/12- specific elevations for dust mite, dog dander, cockroach and some weed pollens. Food IgG profile- elevated to all agents tested, as has been my experience with this test.  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 Lives in a house, no basement, central air conditioning, no mold, has a dog. Boyfriend smokes outside.  10/30/12- 40 yoF never smoker referred courtesy of Dr Laury Axon for allergy evaluation. FOLLOWS FOR: patient reports no complaints at this time No rash or events interpreted as "allergy" since last here. She feels "fine". Not pregnant but still trying. We reviewed her labs, noting  ANA and sedimentation rate to be followed by her primary physician. She can eat apple once with no problem, but apple 3 days in a row triggers rash.   ROS-see HPI Constitutional:   No-   weight loss, night sweats, fevers, chills, fatigue, lassitude. HEENT:    No-  headaches, difficulty swallowing, tooth/dental problems, sore throat,       + Occasional sneezing, itching, ear ache, nasal congestion, post nasal drip,  CV:  No-   chest pain, orthopnea, PND, swelling in lower extremities, anasarca,  dizziness, palpitations Resp: No-   shortness of breath with exertion or at rest.              No-   productive cough,  No non-productive cough,  No- coughing up of blood.              No-   change in color of mucus.  No- wheezing.   Skin: + Per history of present illness-   rash or lesions. GI:  No-   heartburn, indigestion, abdominal pain, nausea,  GU:. MS:  No-   joint pain or swelling.   Neuro-     nothing unusual Psych:  No- change in mood or affect. No depression or anxiety.  No memory loss.  OBJ- Physical Exam General- Alert, Oriented, Affect-appropriate, Distress- none acute. Overweight Skin- rash-none, lesions- none, excoriation- none. Looks clear today. Lymphadenopathy- none Head- atraumatic            Eyes- Gross vision intact, PERRLA, conjunctivae and secretions clear            Ears- Hearing, canals-normal            Nose- Clear, no-Septal dev, mucus, polyps, erosion, perforation             Throat- Mallampati III , mucosa clear , drainage- none, tonsils- atrophic Neck-  flexible , trachea midline, no stridor , thyroid nl, carotid no bruit Chest - symmetrical excursion , unlabored           Heart/CV- RRR , no murmur , no gallop  , no rub, nl s1 s2                           - JVD- none , edema- none, stasis changes- none, varices- none           Lung- clear to P&A, wheeze- none, cough- none , dullness-none, rub- none           Chest wall-  Abd-  Br/ Gen/ Rectal- Not done, not indicated Extrem- cyanosis- none, clubbing, none, atrophy- none, strength- nl Neuro- grossly intact to observation

## 2012-11-09 NOTE — Assessment & Plan Note (Addendum)
She has learned to avoid eating trigger foods like apple. Moderate elevation of ANA is of uncertain significance to her rash. It should be followed by her primary physician. I suggested she avoid PABA sunscreens because she notices photosensitivity

## 2012-12-23 ENCOUNTER — Other Ambulatory Visit: Payer: Self-pay | Admitting: Obstetrics and Gynecology

## 2012-12-25 MED ORDER — MEFENAMIC ACID 250 MG PO CAPS: ORAL_CAPSULE | ORAL | Status: DC

## 2012-12-25 NOTE — Telephone Encounter (Signed)
Of for refill x1

## 2013-03-19 ENCOUNTER — Encounter: Payer: Self-pay | Admitting: Internal Medicine

## 2013-03-19 ENCOUNTER — Telehealth: Payer: Self-pay | Admitting: Family Medicine

## 2013-03-19 ENCOUNTER — Ambulatory Visit (INDEPENDENT_AMBULATORY_CARE_PROVIDER_SITE_OTHER): Payer: BC Managed Care – PPO | Admitting: Internal Medicine

## 2013-03-19 VITALS — BP 126/78 | HR 89 | Temp 98.5°F | Resp 12 | Wt 315.0 lb

## 2013-03-19 DIAGNOSIS — R404 Transient alteration of awareness: Secondary | ICD-10-CM

## 2013-03-19 DIAGNOSIS — R4020 Unspecified coma: Secondary | ICD-10-CM

## 2013-03-19 DIAGNOSIS — I442 Atrioventricular block, complete: Secondary | ICD-10-CM

## 2013-03-19 NOTE — Patient Instructions (Addendum)
Please review the record and make any corrections; share this with all medical staff seen. If you note change or progression in symptoms please contact us through My Chart ASAP. This will allow us to respond as quickly as possible and schedule appropriate studies (blood test or imaging). 

## 2013-03-19 NOTE — Telephone Encounter (Signed)
Noted that pt has an appt. Encounter closed per MD protocol.

## 2013-03-19 NOTE — Telephone Encounter (Signed)
Patient Information:  Caller Name: Falesha  Phone: [phone]  Patient: Stacey Huerta, Stacey Huerta  Gender: Female  DOB: July 08, 1976  Age: 37 Years  PCP: Lelon Perla.  Pregnant: No  Office Follow Up:  Does the office need to follow up with this patient?: No  Instructions For The Office: N/A   Symptoms  Reason For Call & Symptoms: patient reports she passed out on 03/18/13.  Pt was alone, thinks she passed out for 1 hour.  Pt states today she has a hit headache(pt did not hit her head)  Reviewed Health History In EMR: Yes  Reviewed Medications In EMR: Yes  Reviewed Allergies In EMR: Yes  Reviewed Surgeries / Procedures: Yes  Date of Onset of Symptoms: 03/18/2013 OB / GYN:  LMP: 02/28/2013  Guideline(s) Used:  Fainting  Disposition Per Guideline:   See Today in Office  Reason For Disposition Reached:   All other patients, and now alert and feels fine (Exception: SIMPLE FAINT due to stress, pain, prolonged standing, or suddenly standing)  Advice Given:  Call Back If:  You become worse.  Patient Will Follow Care Advice:  YES  Appointment Scheduled:  03/19/2013 13:15:00 Appointment Scheduled Provider:  Marga Melnick  (no appt found for Dr Laury Axon)

## 2013-03-19 NOTE — Progress Notes (Signed)
  Subjective:    Patient ID: Stacey Huerta, female    DOB: [birth year], 37 y.o.   MRN: [id number]  HPI  She experienced loss of consciousness 03/18/13 between 1-2 PM. The last thing she remembers was walking to her couch. She believes she had loss of consciousness for 60-90 minutes.  The only prodrome prior to the event was lightheadedness and vague sensation in the pit of her stomach. She also felt chilled.   Upon awakening she did have some frontal dull headache , up to 8 , today 4-5. NSAIDS helped.  There was no prodrome of gait dysfunction; there was no trigger of body or head position change. She denied vertigo prior to the event. There were no associated seizure stigmata such as urine or stool incontinence or oral injury. She specifically denied any chest pain, palpitations, change in rhythm prior to the event. She also denied headache, limb weakness, or extremity numbness or tingling before the event. There was no associated diplopia, blurred vision, loss of vision. No hearing loss or tinnitus   She does have a past history of paroxysmal dizziness associated with syncope possibly  5-6 X since 2003. She was apparently evaluated by [facility] neurology. She also was evaluated by cardiologist who felt there may be a component of hypotension to her symptoms  No PMH of head trauma .The chart lists a diagnosis of vasovagal syncope. She denies knowledge of such.  Review of Systems  Symptoms & signs not present or negative include: Constitutional : No fever ,  sweats , change in weight ENT:No sinus pressure;nasal purulence;earache ;otic discharge Pulmonary: No acute dyspnea, cough, hemoptysis Psych: No significant anxiety,panic , depression      Objective:   Physical Exam       Gen. appearance: Well-nourished, in no distress Eyes: Extraocular motion intact, field of vision normal, vision grossly intact, no nystagmus ENT: Canals clear, tympanic membranes normal, tuning fork exam  normal, hearing grossly normal Neck: Slightly decreased range of motion, no masses, normal thyroid Cardiovascular: Rate and rhythm normal; no murmurs, gallops or extra heart sounds Muscle skeletal: Range of motion, tone, &  strength normal. Minor crepitus L knee. Hallux change R foot Neuro:Oriented X3.No cranial nerve deficit, deep tendon  reflexes normal, gait including tiptoe & heel walk normal. Rhomberg & finger to nose negative. Lymph: No cervical or axillary LA Skin: Warm and dry without suspicious lesions or rashes Psych: no anxiety or mood change. Normally interactive and cooperative.            Assessment & Plan:  #1 loss of consciousness, recurrent issue since 2003  #2  Wenkebach phenomena  Plan: See orders and recommendations  EKG reviewed with Dr. Ladona Ridgel, cardiologist. He recommended a 30 day event monitor with followup with him. Emergent followup was not felt to be necessary based on his review of the EKG.

## 2013-03-20 LAB — AST: AST: 36 U/L (ref 0–37)

## 2013-03-20 LAB — BASIC METABOLIC PANEL
Chloride: 107 mEq/L (ref 96–112)
Potassium: 4.2 mEq/L (ref 3.5–5.1)
Sodium: 138 mEq/L (ref 135–145)

## 2013-03-20 LAB — CBC WITH DIFFERENTIAL/PLATELET
Basophils Relative: 0.2 % (ref 0.0–3.0)
Eosinophils Relative: 0.6 % (ref 0.0–5.0)
HCT: 38.1 % (ref 36.0–46.0)
Lymphs Abs: 1.8 10*3/uL (ref 0.7–4.0)
MCV: 94.5 fl (ref 78.0–100.0)
Monocytes Absolute: 0.4 10*3/uL (ref 0.1–1.0)
Monocytes Relative: 5.9 % (ref 3.0–12.0)
Neutrophils Relative %: 65.3 % (ref 43.0–77.0)
RBC: 4.03 Mil/uL (ref 3.87–5.11)
WBC: 6.5 10*3/uL (ref 4.5–10.5)

## 2013-03-23 ENCOUNTER — Institutional Professional Consult (permissible substitution): Payer: BC Managed Care – PPO | Admitting: Cardiology

## 2013-03-24 ENCOUNTER — Encounter: Payer: Self-pay | Admitting: *Deleted

## 2013-03-24 ENCOUNTER — Ambulatory Visit (INDEPENDENT_AMBULATORY_CARE_PROVIDER_SITE_OTHER): Payer: BC Managed Care – PPO | Admitting: Cardiology

## 2013-03-24 ENCOUNTER — Telehealth: Payer: Self-pay | Admitting: *Deleted

## 2013-03-24 ENCOUNTER — Encounter: Payer: Self-pay | Admitting: Cardiology

## 2013-03-24 ENCOUNTER — Encounter (INDEPENDENT_AMBULATORY_CARE_PROVIDER_SITE_OTHER): Payer: BC Managed Care – PPO

## 2013-03-24 VITALS — BP 124/72 | HR 86 | Ht 70.0 in | Wt 321.8 lb

## 2013-03-24 DIAGNOSIS — R55 Syncope and collapse: Secondary | ICD-10-CM

## 2013-03-24 DIAGNOSIS — R4020 Unspecified coma: Secondary | ICD-10-CM

## 2013-03-24 DIAGNOSIS — I442 Atrioventricular block, complete: Secondary | ICD-10-CM

## 2013-03-24 NOTE — Telephone Encounter (Signed)
Left message to call back  Per  Dr. Patty Sermons patient needs echo for syncope

## 2013-03-24 NOTE — Progress Notes (Signed)
Patient ID: Stacey Huerta, female   DOB: 05-Jun-1976, 37 y.o.   MRN: [id number] E-Cardio Bramer 30 day event monitor placed on patient.

## 2013-03-24 NOTE — Patient Instructions (Addendum)
Your physician has recommended that you wear an event monitor. Event monitors are medical devices that record the heart's electrical activity. Doctors most often [place] these monitors to diagnose arrhythmias. Arrhythmias are problems with the speed or rhythm of the heartbeat. The monitor is a small, portable device. You can wear one while you do your normal daily activities. This is usually used to diagnose what is causing palpitations/syncope (passing out).  Your physician recommends that you continue on your current medications as directed. Please refer to the Current Medication list given to you today.  Follow up office visit in about a month, after monitor completed

## 2013-03-24 NOTE — Progress Notes (Signed)
Hall Busing Date of Birth:  1975/11/13 [facility] [address] [place], [place]  [address] [phone]         Fax   [phone]  History of Present Illness: This 37 year old [place] American woman is seen by me for the first time today.  She is a medical patient of Dr. Laury Axon.  She was seen in her office by Dr. Alwyn Ren on 03/19/13 after having an episode of syncope the previous day between 1 and 2 PM.  She recalls a mild sense of warning and recalls walking toward her couch at home and then apparently blacked out completely for an indeterminate period of time and woke up on the couch.  When seen by Dr. Alwyn Ren the next day her electrocardiogram demonstrated normal sinus rhythm with Wenckebach  second degree AV block. The patient states that she has a remote history of syncopal episodes dating back to 2003.  She states that between 2003 and 2009 she estimates that she had about 6 or 7 episodes of syncope.  No cause was ever found.  After 2009 the symptoms subsided on their own until recently.  She has been feeling lightheaded for the past 2 weeks.  She has not been experiencing any chest pain and she has not had any increased shortness of breath.  She has a history of exogenous obesity and states that her weight is gradually coming down through dietary means and regular exercise.  She reports that she is not using any type of diet pill.  She is on no medications which are known to affect the AV node. Her family history reveals that both of her parents are living and in good health.  No one in the family has any history of having to have a pacemaker etc.  Current Outpatient Prescriptions  Medication Sig Dispense Refill  . Docosahexaenoic Acid (DHA COMPLETE) 200 MG CAPS Take 1 capsule by mouth every other day.      Marland Kitchen EPINEPHrine (EPI-PEN) 0.3 mg/0.3 mL DEVI For severe allergic reaction  1 Device  prn  . Mefenamic Acid (PONSTEL) 250 MG CAPS Take 2 tablets po STAT;then 1  tablet po every 6 hours.  30 each  1   No current facility-administered medications for this visit.    Allergies  Allergen Reactions  . Eggs Or Egg-Derived Products   . Sulfonamide Derivatives Hives    Also high fever  . Other     Mushrooms and [place] Nuts  . Penicillins     Childhood reaction  . Shellfish Allergy Nausea And Vomiting    Patient Active Problem List   Diagnosis Date Noted  . 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 CHOLECYSTECTOMY, LAPAROSCOPIC, HX OF 08/06/2008  . OBESITY, UNSPECIFIED 04/06/2008  . VASOVAGAL SYNCOPE 12/31/2007  . FATIGUE 12/31/2007    History  Smoking status  . Never Smoker   Smokeless tobacco  . Never Used    History  Alcohol Use  . Yes    Comment: socially    Family History  Problem Relation Age of Onset  . Hypertension Paternal Grandfather   . Diabetes Paternal Grandfather   . Diabetes Father   . Hypotension Father   . Colon cancer Paternal Grandmother   . Breast cancer Maternal Aunt   . Prostate cancer Maternal Grandfather   . Arthritis Paternal Aunt   . Hyperlipidemia Mother   . Hyperlipidemia Maternal Aunt     x's 2    Review of Systems: Constitutional: no fever  chills diaphoresis or fatigue or change in weight.  Head and neck: no hearing loss, no epistaxis, no photophobia or visual disturbance. Respiratory: No cough, shortness of breath or wheezing. Cardiovascular: No chest pain peripheral edema, palpitations. Gastrointestinal: No abdominal distention, no abdominal pain, no change in bowel habits hematochezia or melena. Genitourinary: No dysuria, no frequency, no urgency, no nocturia. Musculoskeletal:No arthralgias, no back pain, no gait disturbance or myalgias. Neurological: No dizziness, no headaches, no numbness, no seizures, , no weakness, no tremors. Hematologic: No lymphadenopathy, no easy bruising. Psychiatric: No confusion, no hallucinations, no sleep disturbance.    Physical Exam: Filed Vitals:    03/24/13 1140  BP: 124/72  Pulse: 86   the general appearance reveals a large African American woman in no distress.The head and neck exam reveals pupils equal and reactive.  Extraocular movements are full.  There is no scleral icterus.  The mouth and pharynx are normal.  The neck is supple.  The carotids reveal no bruits.  The jugular venous pressure is normal.  The  thyroid is not enlarged.  There is no lymphadenopathy.  The chest is clear to percussion and auscultation.  There are no rales or rhonchi.  Expansion of the chest is symmetrical.  The precordium is quiet.  The first heart sound is normal.  The second heart sound is physiologically split.  There is no murmur gallop rub or click.  There is no abnormal lift or heave.  The abdomen is soft and nontender.  The bowel sounds are normal.  The liver and spleen are not enlarged.  There are no abdominal masses.  There are no abdominal bruits.  Extremities reveal good pedal pulses.  There is no phlebitis or edema.  There is no cyanosis or clubbing.  Strength is normal and symmetrical in all extremities.  There is no lateralizing weakness.  There are no sensory deficits.  The skin is warm and dry.  There is no rash.  EKG from 5/29/ 14 was reviewed and demonstrates Mobitz 1 AV block, otherwise known as Wenckebach. Repeat electrocardiogram today shows normal sinus rhythm with first degree AV block.  Assessment / Plan: Syncope of unknown etiology, possibly cardiac since her recent EKG one day following the episode showed second degree heart block. The patient had recent lab work at Dr. Frederik Pear office including thyroid function studies chemistries and CBC all of which were unremarkable. Plan: We will put a 30 day event monitor on the patient to try to document whether she is having more extensive degrees of heart block.  We will also have her return for a two-dimensional echocardiogram to evaluate for other causes of syncope. Many thanks for the  opportunity to see this pleasant woman with you. Many thanks for the opportunity to

## 2013-03-27 ENCOUNTER — Telehealth: Payer: Self-pay | Admitting: Family Medicine

## 2013-03-27 NOTE — Telephone Encounter (Addendum)
Order for Cardiac Event Monitor entered by Dr. Marga Melnick on 03/19/13.  Case/Ref#[id number] was not approved by phone, it was required that all clinicals be faxed to [facility] for cpt [id number], fax [phone], ph [phone].  As of today, 03/27/13, per my call to [facility] this case is still pending.  However, patient was already given monitor and sent home by [facility] on 03/24/13, prior to a decision from [place].  I will make note when I receive decision from [employer].

## 2013-04-07 NOTE — Telephone Encounter (Signed)
This was recommended by Dr Sharlot Gowda taylor after phone consultation the day of her visit

## 2013-04-07 NOTE — Telephone Encounter (Signed)
This is FYI for the person/facility who scheduled/arranged pick up with patient for Cardiac Event Monitor prior to authorization from the insurance company.  On April 01, 2013, [employer] made the decision to DENY prior authorization/coverage in the order request for the Cardiac Event Monitor.  Fax was received on 04-01-13 at the [facility] office, and has been sent to medical records to be scanned into epic.  Order was placed for monitor by Dr. Marga Melnick on 03-19-13, and request was sent to [facility] by fax on 03-23-13, to be reviewed for approval or denial.

## 2013-04-07 NOTE — Telephone Encounter (Signed)
Patient seen by Dr. Patty Sermons, Selena Batten on 03/24/13, and Dr. Alwyn Ren is aware.

## 2013-04-07 NOTE — Telephone Encounter (Signed)
Make sure hopp knows

## 2013-04-07 NOTE — Telephone Encounter (Signed)
Pt should see cardiology

## 2013-04-23 ENCOUNTER — Ambulatory Visit (INDEPENDENT_AMBULATORY_CARE_PROVIDER_SITE_OTHER): Payer: BC Managed Care – PPO | Admitting: Cardiology

## 2013-04-23 ENCOUNTER — Encounter: Payer: Self-pay | Admitting: Cardiology

## 2013-04-23 VITALS — BP 130/78 | HR 76 | Temp 99.8°F | Ht 70.0 in | Wt 321.8 lb

## 2013-04-23 DIAGNOSIS — R55 Syncope and collapse: Secondary | ICD-10-CM

## 2013-04-23 NOTE — Progress Notes (Signed)
Hall Busing Date of Birth:  18-Nov-1975 [facility] [address] [place], [place]  [address] [phone]  Fax   [phone]  HPI: This pleasant 37 year old African American woman is seen for a one-month followup office visit.  We had initially seen her on 03/24/13 after she had had syncope at home on 03/18/13.  She was seen in the office the following day by Dr. Alwyn Ren whose electrocardiogram demonstrated normal sinus rhythm with Wenckebach  second degree AV block.  The patient gives a history of having had syncopal episodes dating back to 2003.  Over the course of the period of time between 2003 and 2009 she estimates that she had about 6 or 7 episodes of syncope and no cause was ever found. When we saw her last month we recommended a 30 day event monitor and an echocardiogram.  The 30 day monitor was completed yesterday and results are pending.  The patient has not yet arranged for her echocardiogram. Since last visit she has had no further episodes of syncope.  Current Outpatient Prescriptions  Medication Sig Dispense Refill  . Cholecalciferol (VITAMIN D) 2000 UNITS CAPS Take 2,000 Units by mouth daily.      Marland Kitchen co-enzyme Q-10 50 MG capsule Take 50 mg by mouth daily.      . Docosahexaenoic Acid (DHA COMPLETE) 200 MG CAPS Take 1 capsule by mouth every other day.      Marland Kitchen EPINEPHrine (EPI-PEN) 0.3 mg/0.3 mL DEVI For severe allergic reaction  1 Device  prn  . Mefenamic Acid (PONSTEL) 250 MG CAPS Take 2 tablets po STAT;then 1 tablet po every 6 hours.  30 each  1  . omega-3 acid ethyl esters (LOVAZA) 1 G capsule Take 2 g by mouth daily.      . vitamin C (ASCORBIC ACID) 250 MG tablet Take 250 mg by mouth daily. Every other day       No current facility-administered medications for this visit.    Allergies  Allergen Reactions  . Eggs Or Egg-Derived Products   . Sulfonamide Derivatives Hives    Also high fever  . Other     Mushrooms and [place] Nuts  . Penicillins       Childhood reaction  . Shellfish Allergy Nausea And Vomiting    Patient Active Problem List   Diagnosis Date Noted  . 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 CHOLECYSTECTOMY, LAPAROSCOPIC, HX OF 08/06/2008  . OBESITY, UNSPECIFIED 04/06/2008  . VASOVAGAL SYNCOPE 12/31/2007  . FATIGUE 12/31/2007    History  Smoking status  . Never Smoker   Smokeless tobacco  . Never Used    History  Alcohol Use  . Yes    Comment: socially    Family History  Problem Relation Age of Onset  . Hypertension Paternal Grandfather   . Diabetes Paternal Grandfather   . Diabetes Father   . Hypotension Father   . Colon cancer Paternal Grandmother   . Breast cancer Maternal Aunt   . Prostate cancer Maternal Grandfather   . Arthritis Paternal Aunt   . Hyperlipidemia Mother   . Hyperlipidemia Maternal Aunt     x's 2    Review of Systems: The patient denies any heat or cold intolerance.  No weight gain or weight loss.  The patient denies headaches or blurry vision.  There is no cough or sputum production.  The patient denies dizziness.  There is no hematuria or hematochezia.  The patient denies any muscle aches or arthritis.  The patient denies any rash.  The patient denies frequent falling or instability.  There is no history of depression or anxiety.  All other systems were reviewed and are negative.   Physical Exam: Filed Vitals:   04/23/13 1605  BP: 130/78  Pulse: 76  Temp: 99.8 F (37.7 C)   the general appearance reveals a large woman in no distress.  She is running a low-grade fever today and complains of a headache.The head and neck exam reveals pupils equal and reactive.  Extraocular movements are full.  There is no scleral icterus.  The mouth and pharynx are normal.  The neck is supple.  The carotids reveal no bruits.  The jugular venous pressure is normal.  The  thyroid is not enlarged.  There is no lymphadenopathy.  The chest is clear to percussion and auscultation.  There are no  rales or rhonchi.  Expansion of the chest is symmetrical.  The precordium is quiet.  The first heart sound is normal.  The second heart sound is physiologically split.  There is no murmur gallop rub or click.  There is no abnormal lift or heave.  The abdomen is soft and nontender.  The bowel sounds are normal.  The liver and spleen are not enlarged.  There are no abdominal masses.  There are no abdominal bruits.  Extremities reveal good pedal pulses.  There is no phlebitis or edema.  There is no cyanosis or clubbing.  Strength is normal and symmetrical in all extremities.  There is no lateralizing weakness.  There are no sensory deficits.  The skin is warm and dry.  There is no rash.      Assessment / Plan: The etiology of her presyncope he is still not known.  We will call her when we receive the results of her 30 day monitor.  She will also return soon for a two-dimensional echocardiogram.  She will return here on a when necessary basis.

## 2013-04-23 NOTE — Patient Instructions (Addendum)

## 2013-04-27 NOTE — Telephone Encounter (Signed)
Patient here last week for follow up ov. Echo was scheduled at that time

## 2013-04-30 ENCOUNTER — Telehealth: Payer: Self-pay | Admitting: *Deleted

## 2013-04-30 NOTE — Telephone Encounter (Signed)
Left message to call back   Event monitor interpretation by  Dr. Patty Sermons: Intermittent Wenckebach second degree AV block. No prolonged pauses.

## 2013-05-01 ENCOUNTER — Ambulatory Visit (HOSPITAL_COMMUNITY): Payer: BC Managed Care – PPO | Attending: Cardiology | Admitting: Radiology

## 2013-05-01 DIAGNOSIS — R55 Syncope and collapse: Secondary | ICD-10-CM | POA: Insufficient documentation

## 2013-05-01 NOTE — Progress Notes (Signed)
Echocardiogram performed.  

## 2013-05-07 NOTE — Telephone Encounter (Signed)
Follow Up ° °Pt returning call from earlier. Please call. °

## 2013-05-08 NOTE — Telephone Encounter (Signed)
F/u   Please call pt she is returning your call.

## 2013-05-08 NOTE — Telephone Encounter (Signed)
Left message to call back  

## 2013-05-08 NOTE — Telephone Encounter (Signed)
Follow up ° ° °Pt returning your call °

## 2013-05-20 NOTE — Telephone Encounter (Signed)
Left message to call back  

## 2013-05-28 ENCOUNTER — Telehealth: Payer: Self-pay | Admitting: Nurse Practitioner

## 2013-05-28 NOTE — Telephone Encounter (Signed)
She has been having these syncopal episodes since 2003.  Our tests (echo and event monitor) showed no cardiac cause for her episodes so far. Her Wenckebach is not causing her syncope. She does have mild LV systolic dysfunction. We should have her return for a Lexiscan myoview to rule out ischemia as a cause of her symptoms. Meanwhile continue to try to get regular walking exercise and to lose weight.

## 2013-05-28 NOTE — Telephone Encounter (Signed)
Follow up  Pt is returning call regarding her echo results

## 2013-05-28 NOTE — Telephone Encounter (Signed)
Spoke with patient to review echo results.  Patient states that with report of echo and cardiac monitoring that she would like to know what all this means and what she should do now.  Cardiac monitor showed Intermittent Wenckebach second degree AV block. No prolonged pauses.  Advised patient that I will send message to Dr. Patty Sermons for advice.  Patient verbalized understanding.

## 2013-05-29 NOTE — Telephone Encounter (Signed)
Spoke with patient to inform her of Dr. Yevonne Pax response to her question regarding what is causing her symptoms.  Patient verbalized understanding and states she does not want to schedule the myoview at this time but will call us back if she changes her mind.

## 2013-05-29 NOTE — Telephone Encounter (Signed)
Okay thanks. We will await her call.

## 2013-06-01 NOTE — Telephone Encounter (Signed)
Cassell Clement, MD at 05/29/2013 9:02 AM   Status: Signed            Okay thanks. We will await her call.        Levi Aland, RN at 05/29/2013 8:50 AM    Status: Signed             Spoke with patient to inform her of Dr. Yevonne Pax response to her question regarding what is causing her symptoms. Patient verbalized understanding and states she does not want to schedule the myoview at this time but will call us back if she changes her mind.         Cassell Clement, MD at 05/28/2013 9:11 PM    Status: Signed             She has been having these syncopal episodes since 2003.  Our tests (echo and event monitor) showed no cardiac cause for her episodes so far.  Her Wenckebach is not causing her syncope.  She does have mild LV systolic dysfunction. We should have her return for a Lexiscan myoview to rule out ischemia as a cause of her symptoms. Meanwhile continue to try to get regular walking exercise and to lose weight.        Levi Aland, RN at 05/28/2013 12:38 PM    Status: Signed             Spoke with patient to review echo results. Patient states that with report of echo and cardiac monitoring that she would like to know what all this means and what she should do now. Cardiac monitor showed Intermittent Wenckebach second degree AV block. No prolonged pauses. Advised patient that I will send message to Dr. Patty Sermons for advice. Patient verbalized understanding

## 2013-10-29 LAB — OB RESULTS CONSOLE HIV ANTIBODY (ROUTINE TESTING): HIV: NONREACTIVE

## 2013-10-29 LAB — OB RESULTS CONSOLE ABO/RH: RH Type: POSITIVE

## 2013-10-29 LAB — OB RESULTS CONSOLE GC/CHLAMYDIA
CHLAMYDIA, DNA PROBE: NEGATIVE
GC PROBE AMP, GENITAL: NEGATIVE

## 2013-10-29 LAB — OB RESULTS CONSOLE RPR: RPR: NONREACTIVE

## 2013-10-29 LAB — OB RESULTS CONSOLE HEPATITIS B SURFACE ANTIGEN: Hepatitis B Surface Ag: NEGATIVE

## 2013-10-29 LAB — OB RESULTS CONSOLE ANTIBODY SCREEN: Antibody Screen: NEGATIVE

## 2013-10-29 LAB — OB RESULTS CONSOLE RUBELLA ANTIBODY, IGM: RUBELLA: IMMUNE

## 2014-03-16 ENCOUNTER — Inpatient Hospital Stay (HOSPITAL_COMMUNITY)
Admission: AD | Admit: 2014-03-16 | Discharge: 2014-03-21 | DRG: 765 | Disposition: A | Discharge status: Home / Self Care | Payer: MEDICAID | Source: Ambulatory Visit | Attending: Obstetrics and Gynecology | Admitting: Obstetrics and Gynecology

## 2014-03-16 ENCOUNTER — Encounter (HOSPITAL_COMMUNITY): Payer: Self-pay | Admitting: Anesthesiology

## 2014-03-16 ENCOUNTER — Inpatient Hospital Stay (HOSPITAL_COMMUNITY): Payer: MEDICAID

## 2014-03-16 ENCOUNTER — Encounter (HOSPITAL_COMMUNITY): Payer: Self-pay

## 2014-03-16 DIAGNOSIS — N390 Urinary tract infection, site not specified: Secondary | ICD-10-CM | POA: Diagnosis present

## 2014-03-16 DIAGNOSIS — O2390 Unspecified genitourinary tract infection in pregnancy, unspecified trimester: Secondary | ICD-10-CM | POA: Diagnosis present

## 2014-03-16 DIAGNOSIS — Z6841 Body Mass Index (BMI) 40.0 and over, adult: Secondary | ICD-10-CM

## 2014-03-16 DIAGNOSIS — O3410 Maternal care for benign tumor of corpus uteri, unspecified trimester: Secondary | ICD-10-CM

## 2014-03-16 DIAGNOSIS — Z22330 Carrier of Group B streptococcus: Secondary | ICD-10-CM

## 2014-03-16 DIAGNOSIS — Z8042 Family history of malignant neoplasm of prostate: Secondary | ICD-10-CM

## 2014-03-16 DIAGNOSIS — O34599 Maternal care for other abnormalities of gravid uterus, unspecified trimester: Secondary | ICD-10-CM | POA: Diagnosis present

## 2014-03-16 DIAGNOSIS — Z833 Family history of diabetes mellitus: Secondary | ICD-10-CM

## 2014-03-16 DIAGNOSIS — O99214 Obesity complicating childbirth: Secondary | ICD-10-CM

## 2014-03-16 DIAGNOSIS — Z800 Family history of malignant neoplasm of digestive organs: Secondary | ICD-10-CM

## 2014-03-16 DIAGNOSIS — D689 Coagulation defect, unspecified: Secondary | ICD-10-CM | POA: Diagnosis present

## 2014-03-16 DIAGNOSIS — Z803 Family history of malignant neoplasm of breast: Secondary | ICD-10-CM

## 2014-03-16 DIAGNOSIS — D259 Leiomyoma of uterus, unspecified: Secondary | ICD-10-CM | POA: Diagnosis present

## 2014-03-16 DIAGNOSIS — O9989 Other specified diseases and conditions complicating pregnancy, childbirth and the puerperium: Secondary | ICD-10-CM

## 2014-03-16 DIAGNOSIS — O1490 Unspecified pre-eclampsia, unspecified trimester: Secondary | ICD-10-CM

## 2014-03-16 DIAGNOSIS — O99892 Other specified diseases and conditions complicating childbirth: Secondary | ICD-10-CM | POA: Diagnosis present

## 2014-03-16 DIAGNOSIS — D696 Thrombocytopenia, unspecified: Secondary | ICD-10-CM

## 2014-03-16 DIAGNOSIS — E669 Obesity, unspecified: Secondary | ICD-10-CM | POA: Diagnosis present

## 2014-03-16 DIAGNOSIS — O1414 Severe pre-eclampsia complicating childbirth: Principal | ICD-10-CM | POA: Diagnosis present

## 2014-03-16 DIAGNOSIS — O3490 Maternal care for abnormality of pelvic organ, unspecified, unspecified trimester: Secondary | ICD-10-CM | POA: Diagnosis present

## 2014-03-16 DIAGNOSIS — O321XX0 Maternal care for breech presentation, not applicable or unspecified: Secondary | ICD-10-CM | POA: Diagnosis present

## 2014-03-16 DIAGNOSIS — O9912 Other diseases of the blood and blood-forming organs and certain disorders involving the immune mechanism complicating childbirth: Secondary | ICD-10-CM

## 2014-03-16 HISTORY — DX: Thrombocytopenia, unspecified: D69.6

## 2014-03-16 HISTORY — DX: Benign neoplasm of connective and other soft tissue, unspecified: D21.9

## 2014-03-16 HISTORY — DX: Morbid (severe) obesity due to excess calories: E66.01

## 2014-03-16 HISTORY — DX: Unspecified pre-eclampsia, unspecified trimester: O14.90

## 2014-03-16 LAB — URINE MICROSCOPIC-ADD ON

## 2014-03-16 LAB — URINALYSIS, ROUTINE W REFLEX MICROSCOPIC
GLUCOSE, UA: NEGATIVE mg/dL
KETONES UR: 15 mg/dL — AB
LEUKOCYTES UA: NEGATIVE
NITRITE: POSITIVE — AB
Protein, ur: >300 mg/dL — AB
Specific Gravity, Urine: 1.02 (ref 1.005–1.030)
Urobilinogen, UA: 1 mg/dL (ref 0.0–1.0)
pH: 7 (ref 5.0–8.0)

## 2014-03-16 LAB — COMPREHENSIVE METABOLIC PANEL
ALBUMIN: 2.4 g/dL — AB (ref 3.5–5.2)
ALT: 20 U/L (ref 0–35)
AST: 32 U/L (ref 0–37)
Alkaline Phosphatase: 107 U/L (ref 39–117)
BILIRUBIN TOTAL: 0.9 mg/dL (ref 0.3–1.2)
BUN: 9 mg/dL (ref 6–23)
CHLORIDE: 99 meq/L (ref 96–112)
CO2: 24 mEq/L (ref 19–32)
Calcium: 8.9 mg/dL (ref 8.4–10.5)
Creatinine, Ser: 0.9 mg/dL (ref 0.50–1.10)
GFR calc Af Amer: >90 mL/min (ref >90)
GFR, EST NON AFRICAN AMERICAN: 80 mL/min — AB (ref >90)
Glucose, Bld: 85 mg/dL (ref 70–99)
Potassium: 3.8 mEq/L (ref 3.7–5.3)
Sodium: 135 mEq/L — ABNORMAL LOW (ref 137–147)
Total Protein: 5.7 g/dL — ABNORMAL LOW (ref 6.0–8.3)

## 2014-03-16 LAB — CBC
HCT: 31.9 % — ABNORMAL LOW (ref 36.0–46.0)
Hemoglobin: 11.2 g/dL — ABNORMAL LOW (ref 12.0–15.0)
MCH: 33 pg (ref 26.0–34.0)
MCHC: 35.1 g/dL (ref 30.0–36.0)
MCV: 94.1 fL (ref 78.0–100.0)
PLATELETS: 84 10*3/uL — AB (ref 150–400)
RBC: 3.39 MIL/uL — ABNORMAL LOW (ref 3.87–5.11)
RDW: 14.6 % (ref 11.5–15.5)
WBC: 7.2 10*3/uL (ref 4.0–10.5)

## 2014-03-16 LAB — PROTEIN / CREATININE RATIO, URINE
Creatinine, Urine: 547.75 mg/dL
PROTEIN CREATININE RATIO: 5.55 — AB (ref 0.00–0.15)
Total Protein, Urine: 3038.2 mg/dL

## 2014-03-16 LAB — URIC ACID: URIC ACID, SERUM: 6.7 mg/dL (ref 2.4–7.0)

## 2014-03-16 LAB — LACTATE DEHYDROGENASE: LDH: 313 U/L — ABNORMAL HIGH (ref 94–250)

## 2014-03-16 MED ORDER — LACTATED RINGERS IV SOLN
INTRAVENOUS | Status: DC
  Administered 2014-03-16 – 2014-03-18 (×5): via INTRAVENOUS

## 2014-03-16 MED ORDER — PANTOPRAZOLE SODIUM 40 MG PO TBEC
40.0000 mg | DELAYED_RELEASE_TABLET | Freq: Every day | ORAL | Status: DC
  Administered 2014-03-16 – 2014-03-17 (×2): 40 mg via ORAL
  Filled 2014-03-16 (×4): qty 1

## 2014-03-16 MED ORDER — MAGNESIUM SULFATE 40 G IN LACTATED RINGERS - SIMPLE
2.0000 g/h | INTRAVENOUS | Status: DC
Start: 2014-03-16 — End: 2014-03-18
  Administered 2014-03-17: 2 g/h via INTRAVENOUS
  Filled 2014-03-16 (×3): qty 500

## 2014-03-16 MED ORDER — ACETAMINOPHEN 325 MG PO TABS
650.0000 mg | ORAL_TABLET | ORAL | Status: DC | PRN
  Administered 2014-03-17: 650 mg via ORAL
  Filled 2014-03-16: qty 2

## 2014-03-16 MED ORDER — CALCIUM CARBONATE ANTACID 500 MG PO CHEW: 2.0000 | CHEWABLE_TABLET | ORAL | Status: DC | PRN

## 2014-03-16 MED ORDER — MAGNESIUM SULFATE BOLUS VIA INFUSION
4.0000 g | Freq: Once | INTRAVENOUS | Status: AC
  Administered 2014-03-16: 4 g via INTRAVENOUS
  Filled 2014-03-16: qty 500

## 2014-03-16 MED ORDER — NITROFURANTOIN MONOHYD MACRO 100 MG PO CAPS
100.0000 mg | ORAL_CAPSULE | Freq: Two times a day (BID) | ORAL | Status: DC
  Administered 2014-03-16 – 2014-03-18 (×4): 100 mg via ORAL
  Filled 2014-03-16 (×6): qty 1

## 2014-03-16 MED ORDER — ZOLPIDEM TARTRATE 5 MG PO TABS
5.0000 mg | ORAL_TABLET | Freq: Every evening | ORAL | Status: DC | PRN
  Administered 2014-03-17 – 2014-03-18 (×2): 5 mg via ORAL
  Filled 2014-03-16 (×2): qty 1

## 2014-03-16 MED ORDER — PRENATAL MULTIVITAMIN CH
1.0000 | ORAL_TABLET | Freq: Every day | ORAL | Status: DC
  Administered 2014-03-17: 1 via ORAL
  Filled 2014-03-16: qty 1

## 2014-03-16 MED ORDER — BETAMETHASONE SOD PHOS & ACET 6 (3-3) MG/ML IJ SUSP
12.0000 mg | Freq: Once | INTRAMUSCULAR | Status: AC
  Administered 2014-03-16: 12 mg via INTRAMUSCULAR
  Filled 2014-03-16: qty 2

## 2014-03-16 MED ORDER — DOCUSATE SODIUM 100 MG PO CAPS
100.0000 mg | ORAL_CAPSULE | Freq: Every day | ORAL | Status: DC
  Administered 2014-03-16 – 2014-03-17 (×2): 100 mg via ORAL
  Filled 2014-03-16 (×2): qty 1

## 2014-03-16 MED ORDER — BETAMETHASONE SOD PHOS & ACET 6 (3-3) MG/ML IJ SUSP
12.0000 mg | Freq: Once | INTRAMUSCULAR | Status: AC
Start: 2014-03-17 — End: 2014-03-17
  Administered 2014-03-17: 12 mg via INTRAMUSCULAR
  Filled 2014-03-16: qty 2

## 2014-03-16 MED ORDER — BETAMETHASONE SOD PHOS & ACET 6 (3-3) MG/ML IJ SUSP
12.0000 mg | Freq: Once | INTRAMUSCULAR | Status: DC
  Filled 2014-03-16: qty 2

## 2014-03-16 NOTE — H&P (Signed)
Stacey Huerta is a 38 y.o. female G3P0 at 8 6/7 weeks presents for further evaluation of elevated blood pressures and lab abnormalities. Patient seen today in clinic for repeat labs and assessment. Found to have elevated BPs and low platelets. Denies HA, visual changes and abdominal pain. Reports fetal movement.   Maternal Medical History:  Reason for admission: Pre-eclampsia @ 24 6/7 wks.  Fetal activity: Perceived fetal activity is normal.   Last perceived fetal movement was within the past hour.    Prenatal complications: Pre-eclampsia and thrombocytopenia.     OB History   Grav Para Term Preterm Abortions TAB SAB Ect Mult Living   1              Past Medical History  Diagnosis Date  . [id number](681.2)    Past Surgical History  Procedure Laterality Date  . Cholecystectomy    . Myomectomy    . Myomectomy  05/23/2012    Procedure: MYOMECTOMY;  Surgeon: Governor Specking, MD;  Location: [facility];  Service: Gynecology;  Laterality: N/A;  . Lymphadenectomy      Removed from the neck at age 63  . Abdominal adhesion surgery    . Endometrial ablation     Family History: family history includes Arthritis in her paternal aunt; Breast cancer in her maternal aunt; Colon cancer in her paternal grandmother; Diabetes in her father and paternal grandfather; Hyperlipidemia in her maternal aunt and mother; Hypertension in her paternal grandfather; Hypotension in her father; Prostate cancer in her maternal grandfather. Social History:  reports that she has never smoked. She has never used smokeless tobacco. She reports that she drinks alcohol. She reports that she does not use illicit drugs.   Prenatal Transfer Tool  Maternal Diabetes: 17 (20 April) Genetic Screening:  Maternal Ultrasounds/Referrals:  Fetal Ultrasounds or other Referrals: [facility]; Anterior placenta. No abnormalities. Limited by habitus (20 April) Maternal Substance Abuse:  None Significant Maternal Medications:   None Significant Maternal Lab Results:   Other Comments:    ROS' Positive fetal movement No chest pain No shortness of breath No difficulty breathing No abdominal pain No headaches No visual changes   Blood pressure 143/91, pulse 91, temperature 99.4 F (37.4 C), temperature source Oral, resp. rate 20, height 5\' 9"  (1.753 m), weight 376 lb 11.2 oz (170.87 kg), SpO2 100.00%. Exam Physical Exam  Gen: NAD Lungs: CTA bilat CV: RRR, no murmurs Abd: Gravid, obese Ext: 2+ lower leg pitting edema bilat DTR's 2+ LE bilat No clonus FHRT: Baseline 130's. +Acels , no decels Toco: No CTXs  Prenatal labs: ABO, Rh: O/Positive/-- (01/08 0000) Antibody: Negative (01/08 0000) Rubella: Immune (01/08 0000) RPR: Nonreactive (01/08 0000)  HBsAg: Negative (01/08 0000)  HIV: Non-reactive (01/08 0000)  GBS:  Unknown  Assessment/Plan: Intrauterine pregnancy at 24 6/7 weeks with morbid obesity, elevated BPs, thrombocytopenia, and increased P/Cr Ratio(1.3 - 22 May). Suspect pre-e with severe features. GBS unknown.  P: Admit to L&D     Tox labs     Anatomic scan     Betamethasone     IV Abx     Consider magnesium prophylaxis pending lab results     MFM consultation     Notify NICU  Plan of care discussed reviewed with Dr. Edward Qualia 03/16/2014, 4:40 PM

## 2014-03-16 NOTE — Progress Notes (Addendum)
Deland Pretty, RROB RN will notified CRNA to come for IV placement

## 2014-03-16 NOTE — MAU Note (Signed)
Pt sent from office for PIH eval Platelets are low.

## 2014-03-16 NOTE — Progress Notes (Signed)
Deland Pretty, RROB RN called to come assess patient for IV placement.

## 2014-03-16 NOTE — Progress Notes (Addendum)
Call from RN with PCR results = 5.55  Received 1st dose betamethasone at [facility]:   03/16/14 1549 03/16/14 1645 03/16/14 1651  BP: 143/91  145/86  Pulse: 91  85  Temp: 99.4 F (37.4 C)  99.1 F (37.3 C)  TempSrc: Oral  Oral  Resp: 20  24  Height: 5\' 9"  (1.753 m) 5\' 9"  (1.753 m)   Weight: 376 lb 11.2 oz (170.87 kg) 376 lb 11.2 oz (170.87 kg)   SpO2: 100%     FHR reassuring on intermittent tracing--difficult to monitor due to maternal body habitus.  Results for orders placed during the hospital encounter of 03/16/14 (from the past 24 hour(s))  PROTEIN / CREATININE RATIO, URINE     Status: Abnormal   Collection Time    03/16/14  4:30 PM      Result Value Ref Range   Creatinine, Urine 547.75     Total Protein, Urine 3038.2     PROTEIN CREATININE RATIO 5.55 (*) 0.00 - 0.15  URINALYSIS, ROUTINE W REFLEX MICROSCOPIC     Status: Abnormal   Collection Time    03/16/14  4:30 PM      Result Value Ref Range   Color, Urine AMBER (*) YELLOW   APPearance HAZY (*) CLEAR   Specific Gravity, Urine 1.020  1.005 - 1.030   pH 7.0  5.0 - 8.0   Glucose, UA NEGATIVE  NEGATIVE mg/dL   Hgb urine dipstick MODERATE (*) NEGATIVE   Bilirubin Urine MODERATE (*) NEGATIVE   Ketones, ur 15 (*) NEGATIVE mg/dL   Protein, ur >300 (*) NEGATIVE mg/dL   Urobilinogen, UA 1.0  0.0 - 1.0 mg/dL   Nitrite POSITIVE (*) NEGATIVE   Leukocytes, UA NEGATIVE  NEGATIVE  URINE MICROSCOPIC-ADD ON     Status: Abnormal   Collection Time    03/16/14  4:30 PM      Result Value Ref Range   Squamous Epithelial / LPF FEW (*) RARE   WBC, UA 3-6  <3 WBC/hpf   RBC / HPF 0-2  <3 RBC/hpf   Bacteria, UA FEW (*) RARE   Casts HYALINE CASTS (*) NEGATIVE  CBC     Status: Abnormal   Collection Time    03/16/14  5:00 PM      Result Value Ref Range   WBC 7.2  4.0 - 10.5 K/uL   RBC 3.39 (*) 3.87 - 5.11 MIL/uL   Hemoglobin 11.2 (*) 12.0 - 15.0 g/dL   HCT 31.9 (*) 36.0 - 46.0 %   MCV 94.1  78.0 - 100.0 fL   MCH 33.0   26.0 - 34.0 pg   MCHC 35.1  30.0 - 36.0 g/dL   RDW 14.6  11.5 - 15.5 %   Platelets 84 (*) 150 - 400 K/uL  COMPREHENSIVE METABOLIC PANEL     Status: Abnormal   Collection Time    03/16/14  5:00 PM      Result Value Ref Range   Sodium 135 (*) 137 - 147 mEq/L   Potassium 3.8  3.7 - 5.3 mEq/L   Chloride 99  96 - 112 mEq/L   CO2 24  19 - 32 mEq/L   Glucose, Bld 85  70 - 99 mg/dL   BUN 9  6 - 23 mg/dL   Creatinine, Ser 0.90  0.50 - 1.10 mg/dL   Calcium 8.9  8.4 - 10.5 mg/dL   Total Protein 5.7 (*) 6.0 - 8.3 g/dL   Albumin 2.4 (*) 3.5 -  5.2 g/dL   AST 32  0 - 37 U/L   ALT 20  0 - 35 U/L   Alkaline Phosphatase 107  39 - 117 U/L   Total Bilirubin 0.9  0.3 - 1.2 mg/dL   GFR calc non Af Amer 80 (*) >90 mL/min   GFR calc Af Amer >90  >90 mL/min  LACTATE DEHYDROGENASE     Status: Abnormal   Collection Time    03/16/14  5:00 PM      Result Value Ref Range   LDH 313 (*) 94 - 250 U/L  URIC ACID     Status: None   Collection Time    03/16/14  5:00 PM      Result Value Ref Range   Uric Acid, Serum 6.7  2.4 - 7.0 mg/dL   Consulted with Dr. Nelda Marseille. Plan Magnesium sulfate infusion--IV access still not in place.  Anesthesia in to establish IV. Repeat PIH labs in am. Close observation of maternal/fetal status. Discussed plan of care with patient and partner.  Donnel Saxon, CNM 03/16/14 9:30pm

## 2014-03-16 NOTE — MAU Note (Signed)
Patient state she has had increasing swelling. Went to the office today and had low platelets. Sent to MAU for evaluation.States she has not been feeling well for a while, no appetite. Has a lot of back pain, no bleeding or discharge. Reports fetal movement.

## 2014-03-17 LAB — CULTURE, OB URINE
CULTURE: NO GROWTH
Colony Count: NO GROWTH

## 2014-03-17 LAB — COMPREHENSIVE METABOLIC PANEL
ALBUMIN: 2.5 g/dL — AB (ref 3.5–5.2)
ALBUMIN: 2.4 g/dL — AB (ref 3.5–5.2)
ALK PHOS: 113 U/L (ref 39–117)
ALT: 18 U/L (ref 0–35)
ALT: 17 U/L (ref 0–35)
ALT: 18 U/L (ref 0–35)
ALT: 17 U/L (ref 0–35)
AST: 27 U/L (ref 0–37)
AST: 24 U/L (ref 0–37)
AST: 27 U/L (ref 0–37)
AST: 26 U/L (ref 0–37)
Albumin: 2.4 g/dL — ABNORMAL LOW (ref 3.5–5.2)
Albumin: 2.5 g/dL — ABNORMAL LOW (ref 3.5–5.2)
Alkaline Phosphatase: 112 U/L (ref 39–117)
Alkaline Phosphatase: 109 U/L (ref 39–117)
Alkaline Phosphatase: 119 U/L — ABNORMAL HIGH (ref 39–117)
BILIRUBIN TOTAL: 0.7 mg/dL (ref 0.3–1.2)
BUN: 8 mg/dL (ref 6–23)
BUN: 8 mg/dL (ref 6–23)
BUN: 9 mg/dL (ref 6–23)
BUN: 10 mg/dL (ref 6–23)
CALCIUM: 8.9 mg/dL (ref 8.4–10.5)
CALCIUM: 8.3 mg/dL — AB (ref 8.4–10.5)
CALCIUM: 8.5 mg/dL (ref 8.4–10.5)
CHLORIDE: 99 meq/L (ref 96–112)
CO2: 21 mEq/L (ref 19–32)
CO2: 22 mEq/L (ref 19–32)
CO2: 23 meq/L (ref 19–32)
CO2: 21 mEq/L (ref 19–32)
CREATININE: 0.82 mg/dL (ref 0.50–1.10)
Calcium: 8.1 mg/dL — ABNORMAL LOW (ref 8.4–10.5)
Chloride: 99 mEq/L (ref 96–112)
Chloride: 98 mEq/L (ref 96–112)
Chloride: 99 mEq/L (ref 96–112)
Creatinine, Ser: 0.79 mg/dL (ref 0.50–1.10)
Creatinine, Ser: 0.82 mg/dL (ref 0.50–1.10)
Creatinine, Ser: 0.78 mg/dL (ref 0.50–1.10)
GFR calc Af Amer: >90 mL/min (ref >90)
GFR calc Af Amer: >90 mL/min (ref >90)
GFR calc non Af Amer: 89 mL/min — ABNORMAL LOW (ref >90)
GFR calc non Af Amer: >90 mL/min (ref >90)
GFR, EST NON AFRICAN AMERICAN: 89 mL/min — AB (ref >90)
GLUCOSE: 98 mg/dL (ref 70–99)
GLUCOSE: 103 mg/dL — AB (ref 70–99)
GLUCOSE: 114 mg/dL — AB (ref 70–99)
Glucose, Bld: 144 mg/dL — ABNORMAL HIGH (ref 70–99)
POTASSIUM: 4.1 meq/L (ref 3.7–5.3)
POTASSIUM: 3.6 meq/L — AB (ref 3.7–5.3)
Potassium: 3.9 mEq/L (ref 3.7–5.3)
Potassium: 3.9 mEq/L (ref 3.7–5.3)
SODIUM: 133 meq/L — AB (ref 137–147)
SODIUM: 133 meq/L — AB (ref 137–147)
Sodium: 133 mEq/L — ABNORMAL LOW (ref 137–147)
Sodium: 134 mEq/L — ABNORMAL LOW (ref 137–147)
TOTAL PROTEIN: 6.2 g/dL (ref 6.0–8.3)
TOTAL PROTEIN: 5.8 g/dL — AB (ref 6.0–8.3)
TOTAL PROTEIN: 5.7 g/dL — AB (ref 6.0–8.3)
Total Bilirubin: 0.8 mg/dL (ref 0.3–1.2)
Total Bilirubin: 0.7 mg/dL (ref 0.3–1.2)
Total Bilirubin: 0.6 mg/dL (ref 0.3–1.2)
Total Protein: 5.8 g/dL — ABNORMAL LOW (ref 6.0–8.3)

## 2014-03-17 LAB — URIC ACID
URIC ACID, SERUM: 6.9 mg/dL (ref 2.4–7.0)
Uric Acid, Serum: 7 mg/dL (ref 2.4–7.0)
Uric Acid, Serum: 7.1 mg/dL — ABNORMAL HIGH (ref 2.4–7.0)
Uric Acid, Serum: 6.8 mg/dL (ref 2.4–7.0)

## 2014-03-17 LAB — CBC
HCT: 33.7 % — ABNORMAL LOW (ref 36.0–46.0)
HEMATOCRIT: 35.5 % — AB (ref 36.0–46.0)
HEMATOCRIT: 34.9 % — AB (ref 36.0–46.0)
HEMATOCRIT: 32 % — AB (ref 36.0–46.0)
HEMOGLOBIN: 12.5 g/dL (ref 12.0–15.0)
HEMOGLOBIN: 12.1 g/dL (ref 12.0–15.0)
Hemoglobin: 12 g/dL (ref 12.0–15.0)
Hemoglobin: 11.2 g/dL — ABNORMAL LOW (ref 12.0–15.0)
MCH: 32.9 pg (ref 26.0–34.0)
MCH: 33.2 pg (ref 26.0–34.0)
MCH: 32.7 pg (ref 26.0–34.0)
MCH: 32.8 pg (ref 26.0–34.0)
MCHC: 35.2 g/dL (ref 30.0–36.0)
MCHC: 35.6 g/dL (ref 30.0–36.0)
MCHC: 34.7 g/dL (ref 30.0–36.0)
MCHC: 35 g/dL (ref 30.0–36.0)
MCV: 93.4 fL (ref 78.0–100.0)
MCV: 93.4 fL (ref 78.0–100.0)
MCV: 94.3 fL (ref 78.0–100.0)
MCV: 93.8 fL (ref 78.0–100.0)
PLATELETS: 104 10*3/uL — AB (ref 150–400)
Platelets: 99 10*3/uL — ABNORMAL LOW (ref 150–400)
Platelets: 110 10*3/uL — ABNORMAL LOW (ref 150–400)
Platelets: 111 10*3/uL — ABNORMAL LOW (ref 150–400)
RBC: 3.8 MIL/uL — ABNORMAL LOW (ref 3.87–5.11)
RBC: 3.61 MIL/uL — AB (ref 3.87–5.11)
RBC: 3.7 MIL/uL — ABNORMAL LOW (ref 3.87–5.11)
RBC: 3.41 MIL/uL — AB (ref 3.87–5.11)
RDW: 14.4 % (ref 11.5–15.5)
RDW: 14.5 % (ref 11.5–15.5)
RDW: 14.6 % (ref 11.5–15.5)
RDW: 14.8 % (ref 11.5–15.5)
WBC: 7.7 10*3/uL (ref 4.0–10.5)
WBC: 10.3 10*3/uL (ref 4.0–10.5)
WBC: 11.2 10*3/uL — ABNORMAL HIGH (ref 4.0–10.5)
WBC: 11.3 10*3/uL — AB (ref 4.0–10.5)

## 2014-03-17 LAB — LACTATE DEHYDROGENASE
LDH: 317 U/L — ABNORMAL HIGH (ref 94–250)
LDH: 308 U/L — ABNORMAL HIGH (ref 94–250)
LDH: 320 U/L — AB (ref 94–250)
LDH: 303 U/L — ABNORMAL HIGH (ref 94–250)

## 2014-03-17 LAB — MAGNESIUM: MAGNESIUM: 3.9 mg/dL — AB (ref 1.5–2.5)

## 2014-03-17 LAB — TYPE AND SCREEN
ABO/RH(D): O POS
ANTIBODY SCREEN: NEGATIVE

## 2014-03-17 NOTE — Consult Note (Signed)
MATERNAL FETAL MEDICINE CONSULT  Patient Name: Stacey Huerta Record Number:  [id number] Date of Birth: [birth year] Requesting Physician Name:  Delice Lesch, MD Date of Service: 03/17/2014  Chief Complaint Preeclampsia with severe features  History of Present Illness Stacey Huerta was seen today secondary to preeclampsia with severe features at the request of Delice Lesch, MD .  The patient is a 38 y.o. G3P0020,at [redacted]w[redacted]d with an EDD of 06/30/2014, Set as working upon episode creation dating method.  Stacey Huerta was experiencing some nausea and general malaise last week and was seen in her primary OB's clinic last Friday and had preeclampsia labs drawn.  These demonstrated a decreased platelet count of 84.  She was then admitted to [facility] for further assessment.  Repeat labs again showed thrombocytopenia, but the platelet count was slightly increased to 89.  Over the past week she reports intermittent headaches.  She has no visual changes or RUQ pain.  She does report what is now severe lower extremity edema that began ans mild swelling approximately 1 month ago.    Review of Systems Pertinent items are noted in HPI.  Patient History OB History  Gravida Para Term Preterm AB SAB TAB Ectopic Multiple Living  3    2 2     0    # Outcome Date GA Lbr Len/2nd Weight Sex Delivery Anes PTL Lv  3 CUR           2 SAB 1996 [redacted]w[redacted]d         1 SAB 1994 [redacted]w[redacted]d             Past Medical History  Diagnosis Date  . Headache(784.0)   . Fibroid     Past Surgical History  Procedure Laterality Date  . Cholecystectomy    . Myomectomy    . Myomectomy  05/23/2012    Procedure: MYOMECTOMY;  Surgeon: Governor Specking, MD;  Location: [facility];  Service: Gynecology;  Laterality: N/A;  . Lymphadenectomy      Removed from the neck at age 37  . Abdominal adhesion surgery    . Endometrial ablation      History   Social History  . Marital Status: Divorced    Spouse Name: N/A   Number of Children: N/A  . Years of Education: N/A   Social History Main Topics  . Smoking status: Never Smoker   . Smokeless tobacco: Never Used  . Alcohol Use: Yes     Comment: socially  . Drug Use: No  . Sexual Activity: Yes    Partners: Male    Birth Control/ Protection: None   Other Topics Concern  . None   Social History Narrative   Aeronautical engineer 3 days a week    Family History  Problem Relation Age of Onset  . Hypertension Paternal Grandfather   . Diabetes Paternal Grandfather   . Diabetes Father   . Hypotension Father   . Colon cancer Paternal Grandmother   . Breast cancer Maternal Aunt   . Prostate cancer Maternal Grandfather   . Arthritis Paternal Aunt   . Hyperlipidemia Mother   . Hyperlipidemia Maternal Aunt     x's 2   In addition, the patient has no family history of mental retardation, birth defects, or genetic diseases.  Physical Examination Filed Vitals:   03/17/14 0815  BP:   Pulse:   Temp: 98.1 F (36.7 C)  Resp: 20   General appearance - alert, well appearing, and in  no distress Abdomen - soft, nontender, nondistended, no masses or organomegaly Extremities - pedal edema 3 +  Assessment and Recommendations 1.  Preeclampsia with severe features. As Stacey Huerta has no history of chronic hypertension (her first trimester blood pressures were normal), this should be viewed as de novo preeclampsia rather than superimposed.  With thrombocytopenia she is demonstrating severe features.  Generally, expectant management of preeclampsia accompanied by thrombocytopenia past the steroid window is not recommended due to the high risk of complications, including maternal stroke, heart attack, seizure, or death.  Thus, from a maternal standpoint the safest course of action is to proceed with delivery 24 hour after her second dose of betamethasone, which will be tomorrow evening.  However, this has obvious consequences for Stacey Huerta fetus as it will  be delivered at 25 weeks, with all the accompanying complications as a very premature infant.  I recommend that Stacey Huerta have a NICU consult so she can better understand what will occur after birth.  I recommend performing a CBC and CMP every 6 hours to monitor for further laboratory abnormalities until she is delivered and for 24 hours thereafter.  Past that point the frequency of testing can be decreased if her labs begin to normalize.  I spent 30 minutes with Ms. Atilano today of which 50% was face-to-face counseling.  Thank you for referring Stacey Huerta to the [facility].  Please do not hesitate to contact us with questions.   Jolyn Lent, MD

## 2014-03-17 NOTE — Progress Notes (Signed)
Patient ID: Stacey Huerta, female   DOB: 01-Jun-1976, 38 y.o.   MRN: [id number] Stacey Huerta is a 38 y.o. G3P0020 at [redacted]w[redacted]d admitted for preeclampsia  Subjective: Pt denies HA, visual changes, abdominal pain.  Says she feels better than yesterday.   Objective: BP 148/91  Pulse 85  Temp(Src) 98.1 F (36.7 C) (Oral)  Resp 20  Ht 5\' 9"  (1.753 m)  Wt 170.87 kg (376 lb 11.2 oz)  BMI 55.60 kg/m2  SpO2 100% I/O last 3 completed shifts: In: 1495.5 [P.O.:280; I.V.:1215.5] Out: 1300 [Urine:1300]    Physical Exam:  Gen: alert Chest/Lungs: cta bilaterally  Heart/Pulse: RRR  Abdomen: soft, gravid, nontender Uterine fundus: soft, nontender Skin & Color: warm and dry    FHT:  Reassuring last monitored (pt to be placed on monitor) UC:   none SVE:    deferred  U/S - breech, 1lb 9oz, 44th%  Labs: Lab Results  Component Value Date   WBC 7.7 03/17/2014   HGB 12.5 03/17/2014   HCT 35.5* 03/17/2014   MCV 93.4 03/17/2014   PLT 99* 03/17/2014    Assessment and Plan: has OBESITY, UNSPECIFIED; CHOLECYSTECTOMY, LAPAROSCOPIC, HX OF; Urticaria due to food allergy; Preeclampsia; Morbid obesity; and Thrombocytopenia, unspecified on her problem list. Pt with preeclampsia currently on magnesium sulfate while collecting 24hr urine.  Pt with UTI and on macrobid.  UTI may be contributing some to proteinuria but given the UPCR, I doubt that it is solely contributing to it.  Pt's BPs are also elevated but currently asymptomatic.  MFM consult requested and will appreciate parameters for delivery.  Pt is getting BMZ and with her h/o myomectomy will be delivered via c-section.    Delice Lesch 03/17/2014, 9:09 AM

## 2014-03-17 NOTE — Progress Notes (Signed)
Requested by RN to re-evaluate monitoring order. Due to maternal body habitus, continuous EFM is very difficult. Patient currently asleep, FHR not tracing.. FHR audible at 140-150, no decels.  Filed Vitals:   03/16/14 2226 03/16/14 2234 03/16/14 2337 03/17/14 0030  BP: 146/71 137/73 135/73 139/85  Pulse: 89 92 89 89  Temp:      TempSrc:      Resp: 22 22 22 22   Height:      Weight:      SpO2:        Will change monitoring parameters to 30 min TID.  Donnel Saxon, CNM 03/17/14

## 2014-03-17 NOTE — Progress Notes (Signed)
Stacey Huerta is trying hard to stay positive.  This hospital stay came very unexpectedly and she is still in shock somewhat.  She has support from [name] and she is staying hopeful that she might be able to keep the baby in for a little longer.  We will continue to provide support as we are able, but please also page as needs arise.  [employer] Pager, [phone] 3:37 PM   03/17/14 1500  Clinical Encounter Type  Visited With Patient and family together  Visit Type Spiritual support;Initial  Referral From Nurse  Spiritual Encounters  Spiritual Needs Emotional

## 2014-03-17 NOTE — Consult Note (Signed)
Asked by Dr Mancel Bale to speak to Stacey Huerta to discuss outcome of preterm pregnancy at 25 weeks. I spoke to her in her room with her partner.    Chart reviewed. She is 38 y.o G3P0020, 25 0/7 weeks,  EDC 06/30/2014. EFW 700 gms based on FUS on 5/26.  Prenatal labs:  ABO, Rh: O/Positive/-- (01/08 0000)  Antibody: Negative (01/08 0000)  Rubella: Immune (01/08 0000)  RPR: Nonreactive (01/08 0000)  HBsAg: Negative (01/08 0000)  HIV: Non-reactive (01/08 0000)  GBS: Unknown   Pregnancy is complicated with obesity, UTI, and severe preeclampsia.  She has received her 2nd  dose of betamethasone tonight and is planned for delivery 24 hrs after  via C/S due to previous myomectomy. She is currently on macrobid and magnesium sulfate.   I discussed NICU team presence and resuscitation. I also discussed survival rate and common morbidities associated with 25 gestation such as RDS with vent support and surfactant treatment, GI immaturity, high incidence of IVH  and developmental disabilities. Discussed need for temp support, HAL, umbilical lines, gavage feeding, and LOS.  I also discussed breast feeding and kangaroo care and benefits of these especially to a preterm baby.   I answered their questions to their satisfaction.  [name] wants to tour the NICU. This will be arranged with the NICU Charge RN.  Thank you for inviting [place] to meet this parents before delivery and coordinate this coming baby's medical care.  I spent 40 minutes with this consult, more than 50% of the time was with face-to-face counseling with Stacey Huerta and her partner.   Tommie Sams, MD  Neonatologist

## 2014-03-18 ENCOUNTER — Inpatient Hospital Stay (HOSPITAL_COMMUNITY): Payer: MEDICAID | Admitting: Anesthesiology

## 2014-03-18 ENCOUNTER — Encounter (HOSPITAL_COMMUNITY): Payer: MEDICAID | Admitting: Anesthesiology

## 2014-03-18 ENCOUNTER — Encounter (HOSPITAL_COMMUNITY): Payer: Self-pay | Admitting: Anesthesiology

## 2014-03-18 ENCOUNTER — Encounter (HOSPITAL_COMMUNITY)
Admission: AD | Disposition: A | Discharge status: Home / Self Care | Payer: Self-pay | Source: Ambulatory Visit | Attending: Obstetrics and Gynecology

## 2014-03-18 LAB — COMPREHENSIVE METABOLIC PANEL
ALBUMIN: 2.4 g/dL — AB (ref 3.5–5.2)
ALBUMIN: 2.3 g/dL — AB (ref 3.5–5.2)
ALK PHOS: 105 U/L (ref 39–117)
ALT: 16 U/L (ref 0–35)
ALT: 18 U/L (ref 0–35)
AST: 27 U/L (ref 0–37)
AST: 28 U/L (ref 0–37)
Alkaline Phosphatase: 99 U/L (ref 39–117)
BUN: 10 mg/dL (ref 6–23)
BUN: 11 mg/dL (ref 6–23)
CALCIUM: 7.8 mg/dL — AB (ref 8.4–10.5)
CO2: 23 meq/L (ref 19–32)
CO2: 25 meq/L (ref 19–32)
CREATININE: 0.79 mg/dL (ref 0.50–1.10)
Calcium: 7.9 mg/dL — ABNORMAL LOW (ref 8.4–10.5)
Chloride: 99 mEq/L (ref 96–112)
Chloride: 101 mEq/L (ref 96–112)
Creatinine, Ser: 0.78 mg/dL (ref 0.50–1.10)
GFR calc Af Amer: >90 mL/min (ref >90)
GFR calc non Af Amer: >90 mL/min (ref >90)
GFR calc non Af Amer: >90 mL/min (ref >90)
GLUCOSE: 111 mg/dL — AB (ref 70–99)
Glucose, Bld: 126 mg/dL — ABNORMAL HIGH (ref 70–99)
POTASSIUM: 4 meq/L (ref 3.7–5.3)
Potassium: 3.9 mEq/L (ref 3.7–5.3)
SODIUM: 135 meq/L — AB (ref 137–147)
Sodium: 138 mEq/L (ref 137–147)
Total Bilirubin: 0.5 mg/dL (ref 0.3–1.2)
Total Bilirubin: 0.5 mg/dL (ref 0.3–1.2)
Total Protein: 5.9 g/dL — ABNORMAL LOW (ref 6.0–8.3)
Total Protein: 5.7 g/dL — ABNORMAL LOW (ref 6.0–8.3)

## 2014-03-18 LAB — OB RESULTS CONSOLE GBS: STREP GROUP B AG: POSITIVE

## 2014-03-18 LAB — PREPARE RBC (CROSSMATCH)

## 2014-03-18 LAB — LACTATE DEHYDROGENASE
LDH: 350 U/L — ABNORMAL HIGH (ref 94–250)
LDH: 344 U/L — ABNORMAL HIGH (ref 94–250)

## 2014-03-18 LAB — CBC
HCT: 32.1 % — ABNORMAL LOW (ref 36.0–46.0)
HEMATOCRIT: 32.4 % — AB (ref 36.0–46.0)
HEMOGLOBIN: 11.4 g/dL — AB (ref 12.0–15.0)
Hemoglobin: 11.2 g/dL — ABNORMAL LOW (ref 12.0–15.0)
MCH: 33 pg (ref 26.0–34.0)
MCH: 33.4 pg (ref 26.0–34.0)
MCHC: 34.9 g/dL (ref 30.0–36.0)
MCHC: 35.2 g/dL (ref 30.0–36.0)
MCV: 94.7 fL (ref 78.0–100.0)
MCV: 95 fL (ref 78.0–100.0)
PLATELETS: 123 10*3/uL — AB (ref 150–400)
Platelets: 123 10*3/uL — ABNORMAL LOW (ref 150–400)
RBC: 3.39 MIL/uL — AB (ref 3.87–5.11)
RBC: 3.41 MIL/uL — ABNORMAL LOW (ref 3.87–5.11)
RDW: 14.8 % (ref 11.5–15.5)
RDW: 14.8 % (ref 11.5–15.5)
WBC: 11.8 10*3/uL — ABNORMAL HIGH (ref 4.0–10.5)
WBC: 13.1 10*3/uL — AB (ref 4.0–10.5)

## 2014-03-18 LAB — PROTEIN, URINE, 24 HOUR
Collection Interval-UPROT: 24 hours
PROTEIN, URINE: 396 mg/dL
Protein, 24H Urine: 9504 mg/d — ABNORMAL HIGH (ref 50–100)
URINE TOTAL VOLUME-UPROT: 2400 mL

## 2014-03-18 LAB — URIC ACID
URIC ACID, SERUM: 6.6 mg/dL (ref 2.4–7.0)
Uric Acid, Serum: 6.9 mg/dL (ref 2.4–7.0)

## 2014-03-18 LAB — GROUP B STREP BY PCR: Group B strep by PCR: POSITIVE — AB

## 2014-03-18 SURGERY — Surgical Case
Anesthesia: Spinal | Site: Abdomen

## 2014-03-18 MED ORDER — WITCH HAZEL-GLYCERIN EX PADS: 1.0000 "application " | MEDICATED_PAD | CUTANEOUS | Status: DC | PRN

## 2014-03-18 MED ORDER — ONDANSETRON HCL 4 MG PO TABS: 4.0000 mg | ORAL_TABLET | ORAL | Status: DC | PRN

## 2014-03-18 MED ORDER — PRENATAL MULTIVITAMIN CH
1.0000 | ORAL_TABLET | Freq: Every day | ORAL | Status: DC
  Administered 2014-03-19 – 2014-03-20 (×2): 1 via ORAL
  Filled 2014-03-18 (×2): qty 1

## 2014-03-18 MED ORDER — BUPIVACAINE IN DEXTROSE 0.75-8.25 % IT SOLN
INTRATHECAL | Status: DC | PRN
  Administered 2014-03-18: 1 mL via INTRATHECAL

## 2014-03-18 MED ORDER — EPHEDRINE SULFATE 50 MG/ML IJ SOLN
INTRAMUSCULAR | Status: DC | PRN
  Administered 2014-03-18 (×3): 10 mg via INTRAVENOUS

## 2014-03-18 MED ORDER — DIBUCAINE 1 % RE OINT: 1.0000 "application " | TOPICAL_OINTMENT | RECTAL | Status: DC | PRN

## 2014-03-18 MED ORDER — NALBUPHINE HCL 10 MG/ML IJ SOLN: 5.0000 mg | INTRAMUSCULAR | Status: DC | PRN

## 2014-03-18 MED ORDER — 0.9 % SODIUM CHLORIDE (POUR BTL) OPTIME
TOPICAL | Status: DC | PRN
  Administered 2014-03-18: 1000 mL

## 2014-03-18 MED ORDER — PROMETHAZINE HCL 25 MG/ML IJ SOLN: 6.2500 mg | INTRAMUSCULAR | Status: DC | PRN

## 2014-03-18 MED ORDER — OXYCODONE-ACETAMINOPHEN 5-325 MG PO TABS
1.0000 | ORAL_TABLET | ORAL | Status: DC | PRN
  Administered 2014-03-19 (×2): 1 via ORAL
  Administered 2014-03-19 – 2014-03-21 (×5): 2 via ORAL
  Filled 2014-03-18 (×2): qty 1
  Filled 2014-03-18: qty 2
  Filled 2014-03-18: qty 1
  Filled 2014-03-18 (×3): qty 2
  Filled 2014-03-18: qty 1

## 2014-03-18 MED ORDER — DIPHENHYDRAMINE HCL 50 MG/ML IJ SOLN: 12.5000 mg | INTRAMUSCULAR | Status: DC | PRN

## 2014-03-18 MED ORDER — ONDANSETRON HCL 4 MG/2ML IJ SOLN
INTRAMUSCULAR | Status: AC
  Filled 2014-03-18: qty 2

## 2014-03-18 MED ORDER — SCOPOLAMINE 1 MG/3DAYS TD PT72
1.0000 | MEDICATED_PATCH | Freq: Once | TRANSDERMAL | Status: DC
  Administered 2014-03-18: 1.5 mg via TRANSDERMAL

## 2014-03-18 MED ORDER — FENTANYL CITRATE 0.05 MG/ML IJ SOLN
INTRAMUSCULAR | Status: AC
  Filled 2014-03-18: qty 2

## 2014-03-18 MED ORDER — PHENYLEPHRINE HCL 10 MG/ML IJ SOLN
INTRAMUSCULAR | Status: DC | PRN
  Administered 2014-03-18 (×2): 80 ug via INTRAVENOUS
  Administered 2014-03-18: 120 ug via INTRAVENOUS
  Administered 2014-03-18: 80 ug via INTRAVENOUS

## 2014-03-18 MED ORDER — DIPHENHYDRAMINE HCL 50 MG/ML IJ SOLN: 25.0000 mg | INTRAMUSCULAR | Status: DC | PRN

## 2014-03-18 MED ORDER — MEPERIDINE HCL 25 MG/ML IJ SOLN: 6.2500 mg | INTRAMUSCULAR | Status: DC | PRN

## 2014-03-18 MED ORDER — SIMETHICONE 80 MG PO CHEW
80.0000 mg | CHEWABLE_TABLET | ORAL | Status: DC
  Administered 2014-03-18 – 2014-03-21 (×3): 80 mg via ORAL
  Filled 2014-03-18 (×3): qty 1

## 2014-03-18 MED ORDER — MAGNESIUM SULFATE 50 % IJ SOLN: 40.0000 g | INTRAVENOUS | Status: DC | PRN

## 2014-03-18 MED ORDER — MORPHINE SULFATE 0.5 MG/ML IJ SOLN
INTRAMUSCULAR | Status: AC
  Filled 2014-03-18: qty 10

## 2014-03-18 MED ORDER — DIPHENHYDRAMINE HCL 50 MG/ML IJ SOLN
INTRAMUSCULAR | Status: DC | PRN
  Administered 2014-03-18: 25 mg via INTRAVENOUS

## 2014-03-18 MED ORDER — IBUPROFEN 600 MG PO TABS
600.0000 mg | ORAL_TABLET | Freq: Four times a day (QID) | ORAL | Status: DC
  Administered 2014-03-18 – 2014-03-19 (×2): 600 mg via ORAL
  Filled 2014-03-18 (×2): qty 1

## 2014-03-18 MED ORDER — SIMETHICONE 80 MG PO CHEW: 80.0000 mg | CHEWABLE_TABLET | ORAL | Status: DC | PRN

## 2014-03-18 MED ORDER — FENTANYL CITRATE 0.05 MG/ML IJ SOLN
INTRAMUSCULAR | Status: DC | PRN
  Administered 2014-03-18: 100 ug via EPIDURAL

## 2014-03-18 MED ORDER — NALOXONE HCL 1 MG/ML IJ SOLN
1.0000 ug/kg/h | INTRAVENOUS | Status: DC | PRN
  Filled 2014-03-18: qty 2

## 2014-03-18 MED ORDER — EPHEDRINE 5 MG/ML INJ
INTRAVENOUS | Status: AC
  Filled 2014-03-18: qty 10

## 2014-03-18 MED ORDER — LACTATED RINGERS IV SOLN
INTRAVENOUS | Status: DC
  Administered 2014-03-19 (×2): via INTRAVENOUS

## 2014-03-18 MED ORDER — LACTATED RINGERS IV SOLN
INTRAVENOUS | Status: DC | PRN
  Administered 2014-03-18: 18:00:00 via INTRAVENOUS

## 2014-03-18 MED ORDER — DIPHENHYDRAMINE HCL 25 MG PO CAPS: 25.0000 mg | ORAL_CAPSULE | ORAL | Status: DC | PRN

## 2014-03-18 MED ORDER — PHENYLEPHRINE 8 MG IN D5W 100 ML (0.08MG/ML) PREMIX OPTIME
INJECTION | INTRAVENOUS | Status: DC | PRN
  Administered 2014-03-18: 30 ug/min via INTRAVENOUS

## 2014-03-18 MED ORDER — HYDROMORPHONE HCL PF 1 MG/ML IJ SOLN
0.2500 mg | INTRAMUSCULAR | Status: DC | PRN
Start: 2014-03-18 — End: 2014-03-18
  Administered 2014-03-18 (×4): 0.5 mg via INTRAVENOUS

## 2014-03-18 MED ORDER — KETOROLAC TROMETHAMINE 30 MG/ML IJ SOLN: 15.0000 mg | Freq: Once | INTRAMUSCULAR | Status: DC | PRN

## 2014-03-18 MED ORDER — OXYTOCIN 40 UNITS IN LACTATED RINGERS INFUSION - SIMPLE MED
62.5000 mL/h | INTRAVENOUS | Status: AC
  Administered 2014-03-18: 62.5 mL/h via INTRAVENOUS

## 2014-03-18 MED ORDER — OXYTOCIN 10 UNIT/ML IJ SOLN
40.0000 [IU] | INTRAVENOUS | Status: DC | PRN
  Administered 2014-03-18: 40 [IU] via INTRAVENOUS

## 2014-03-18 MED ORDER — CITRIC ACID-SODIUM CITRATE 334-500 MG/5ML PO SOLN
ORAL | Status: AC
  Filled 2014-03-18: qty 15

## 2014-03-18 MED ORDER — LANOLIN HYDROUS EX OINT: 1.0000 "application " | TOPICAL_OINTMENT | CUTANEOUS | Status: DC | PRN

## 2014-03-18 MED ORDER — PHENYLEPHRINE 8 MG IN D5W 100 ML (0.08MG/ML) PREMIX OPTIME
INJECTION | INTRAVENOUS | Status: AC
  Filled 2014-03-18: qty 100

## 2014-03-18 MED ORDER — HYDROMORPHONE HCL PF 1 MG/ML IJ SOLN
INTRAMUSCULAR | Status: AC
  Administered 2014-03-18: 0.5 mg via INTRAVENOUS
  Filled 2014-03-18: qty 1

## 2014-03-18 MED ORDER — LIDOCAINE-EPINEPHRINE (PF) 2 %-1:200000 IJ SOLN
INTRAMUSCULAR | Status: DC | PRN
  Administered 2014-03-18: 3 mL via EPIDURAL

## 2014-03-18 MED ORDER — ATROPINE SULFATE 0.4 MG/ML IJ SOLN
INTRAMUSCULAR | Status: DC | PRN
  Administered 2014-03-18: .4 mg via INTRAVENOUS

## 2014-03-18 MED ORDER — METOCLOPRAMIDE HCL 5 MG/ML IJ SOLN: 10.0000 mg | Freq: Three times a day (TID) | INTRAMUSCULAR | Status: DC | PRN

## 2014-03-18 MED ORDER — ONDANSETRON HCL 4 MG/2ML IJ SOLN: 4.0000 mg | Freq: Three times a day (TID) | INTRAMUSCULAR | Status: DC | PRN

## 2014-03-18 MED ORDER — PHENYLEPHRINE 40 MCG/ML (10ML) SYRINGE FOR IV PUSH (FOR BLOOD PRESSURE SUPPORT)
PREFILLED_SYRINGE | INTRAVENOUS | Status: AC
  Filled 2014-03-18: qty 10

## 2014-03-18 MED ORDER — NALOXONE HCL 0.4 MG/ML IJ SOLN: 0.4000 mg | INTRAMUSCULAR | Status: DC | PRN

## 2014-03-18 MED ORDER — DIPHENHYDRAMINE HCL 50 MG/ML IJ SOLN
INTRAMUSCULAR | Status: AC
  Filled 2014-03-18: qty 1

## 2014-03-18 MED ORDER — KETOROLAC TROMETHAMINE 30 MG/ML IJ SOLN: 30.0000 mg | Freq: Four times a day (QID) | INTRAMUSCULAR | Status: DC | PRN

## 2014-03-18 MED ORDER — ACETAMINOPHEN 160 MG/5ML PO SOLN
ORAL | Status: AC
  Filled 2014-03-18: qty 40.6

## 2014-03-18 MED ORDER — TETANUS-DIPHTH-ACELL PERTUSSIS 5-2.5-18.5 LF-MCG/0.5 IM SUSP
0.5000 mL | Freq: Once | INTRAMUSCULAR | Status: DC
  Filled 2014-03-18: qty 0.5

## 2014-03-18 MED ORDER — ONDANSETRON HCL 4 MG/2ML IJ SOLN
INTRAMUSCULAR | Status: DC | PRN
  Administered 2014-03-18: 4 mg via INTRAVENOUS

## 2014-03-18 MED ORDER — FENTANYL CITRATE 0.05 MG/ML IJ SOLN
INTRAMUSCULAR | Status: DC | PRN
Start: 2014-03-18 — End: 2014-03-18
  Administered 2014-03-18 (×2): 50 ug via INTRAVENOUS

## 2014-03-18 MED ORDER — IBUPROFEN 600 MG PO TABS: 600.0000 mg | ORAL_TABLET | Freq: Four times a day (QID) | ORAL | Status: DC | PRN

## 2014-03-18 MED ORDER — OXYTOCIN 10 UNIT/ML IJ SOLN
INTRAMUSCULAR | Status: AC
  Filled 2014-03-18: qty 4

## 2014-03-18 MED ORDER — MENTHOL 3 MG MT LOZG
1.0000 | LOZENGE | OROMUCOSAL | Status: DC | PRN
  Filled 2014-03-18: qty 1

## 2014-03-18 MED ORDER — ATROPINE SULFATE 0.4 MG/ML IJ SOLN
INTRAMUSCULAR | Status: AC
  Filled 2014-03-18: qty 1

## 2014-03-18 MED ORDER — ACETAMINOPHEN 160 MG/5ML PO SOLN
975.0000 mg | Freq: Once | ORAL | Status: AC
  Administered 2014-03-18: 975 mg via ORAL

## 2014-03-18 MED ORDER — ZOLPIDEM TARTRATE 5 MG PO TABS: 5.0000 mg | ORAL_TABLET | Freq: Every evening | ORAL | Status: DC | PRN

## 2014-03-18 MED ORDER — SIMETHICONE 80 MG PO CHEW
80.0000 mg | CHEWABLE_TABLET | Freq: Three times a day (TID) | ORAL | Status: DC
  Administered 2014-03-19 – 2014-03-21 (×7): 80 mg via ORAL
  Filled 2014-03-18 (×7): qty 1

## 2014-03-18 MED ORDER — DIPHENHYDRAMINE HCL 25 MG PO CAPS: 25.0000 mg | ORAL_CAPSULE | Freq: Four times a day (QID) | ORAL | Status: DC | PRN

## 2014-03-18 MED ORDER — SODIUM CHLORIDE 0.9 % IJ SOLN: 3.0000 mL | INTRAMUSCULAR | Status: DC | PRN

## 2014-03-18 MED ORDER — SCOPOLAMINE 1 MG/3DAYS TD PT72
MEDICATED_PATCH | TRANSDERMAL | Status: AC
  Filled 2014-03-18: qty 1

## 2014-03-18 MED ORDER — GENTAMICIN SULFATE 40 MG/ML IJ SOLN
INTRAVENOUS | Status: AC
  Administered 2014-03-18: 18:00:00 via INTRAVENOUS
  Administered 2014-03-18: 239.5 mL via INTRAVENOUS
  Filled 2014-03-18: qty 13.75

## 2014-03-18 MED ORDER — SENNOSIDES-DOCUSATE SODIUM 8.6-50 MG PO TABS
2.0000 | ORAL_TABLET | ORAL | Status: DC
  Administered 2014-03-18 – 2014-03-21 (×3): 2 via ORAL
  Filled 2014-03-18 (×3): qty 2

## 2014-03-18 MED ORDER — MORPHINE SULFATE (PF) 0.5 MG/ML IJ SOLN
INTRAMUSCULAR | Status: DC | PRN
  Administered 2014-03-18: 2 mg via INTRAVENOUS
  Administered 2014-03-18: 1 mg via EPIDURAL
  Administered 2014-03-18: 2 mg via EPIDURAL

## 2014-03-18 MED ORDER — CLINDAMYCIN PHOSPHATE 900 MG/50ML IV SOLN: 900.0000 mg | Freq: Once | INTRAVENOUS | Status: DC

## 2014-03-18 MED ORDER — MAGNESIUM SULFATE 40 G IN LACTATED RINGERS - SIMPLE
INTRAVENOUS | Status: DC | PRN
  Administered 2014-03-18: 2 g/h via INTRAVENOUS

## 2014-03-18 MED ORDER — ONDANSETRON HCL 4 MG/2ML IJ SOLN: 4.0000 mg | INTRAMUSCULAR | Status: DC | PRN

## 2014-03-18 SURGICAL SUPPLY — 49 items
ADH SKN CLS APL DERMABOND .7 (GAUZE/BANDAGES/DRESSINGS) ×1
APL SKNCLS STERI-STRIP NONHPOA (GAUZE/BANDAGES/DRESSINGS) ×1
BARRIER ADHS 3X4 INTERCEED (GAUZE/BANDAGES/DRESSINGS) ×2 IMPLANT
BENZOIN TINCTURE PRP APPL 2/3 (GAUZE/BANDAGES/DRESSINGS) ×3 IMPLANT
BRR ADH 4X3 ABS CNTRL BYND (GAUZE/BANDAGES/DRESSINGS) ×1
CLAMP CORD UMBIL (MISCELLANEOUS) IMPLANT
CLOSURE WOUND 1/2 X4 (GAUZE/BANDAGES/DRESSINGS) ×1
CLOTH BEACON ORANGE TIMEOUT ST (SAFETY) ×3 IMPLANT
CONTAINER PREFILL 10% NBF 15ML (MISCELLANEOUS) ×2 IMPLANT
DERMABOND ADVANCED (GAUZE/BANDAGES/DRESSINGS) ×2
DERMABOND ADVANCED .7 DNX12 (GAUZE/BANDAGES/DRESSINGS) IMPLANT
DRAIN JACKSON PRT FLT 10 (DRAIN) ×2 IMPLANT
DRAPE INCISE IOBAN 66X45 STRL (DRAPES) ×2 IMPLANT
DRAPE LG THREE QUARTER DISP (DRAPES) IMPLANT
DRSG OPSITE POSTOP 4X10 (GAUZE/BANDAGES/DRESSINGS) ×3 IMPLANT
DRSG OPSITE POSTOP 4X12 (GAUZE/BANDAGES/DRESSINGS) ×2 IMPLANT
DURAPREP 26ML APPLICATOR (WOUND CARE) ×3 IMPLANT
ELECT REM PT RETURN 9FT ADLT (ELECTROSURGICAL) ×3
ELECTRODE REM PT RTRN 9FT ADLT (ELECTROSURGICAL) ×1 IMPLANT
EVACUATOR SILICONE 100CC (DRAIN) ×2 IMPLANT
EXTRACTOR VACUUM M CUP 4 TUBE (SUCTIONS) IMPLANT
EXTRACTOR VACUUM M CUP 4' TUBE (SUCTIONS)
GLOVE BIO SURGEON STRL SZ7.5 (GLOVE) ×3 IMPLANT
GLOVE BIOGEL PI IND STRL 7.5 (GLOVE) ×1 IMPLANT
GLOVE BIOGEL PI INDICATOR 7.5 (GLOVE) ×2
GOWN STRL REUS W/TWL LRG LVL3 (GOWN DISPOSABLE) ×6 IMPLANT
KIT ABG SYR 3ML LUER SLIP (SYRINGE) ×2 IMPLANT
NDL HYPO 25X5/8 SAFETYGLIDE (NEEDLE) IMPLANT
NEEDLE HYPO 25X5/8 SAFETYGLIDE (NEEDLE) ×3 IMPLANT
NS IRRIG 1000ML POUR BTL (IV SOLUTION) ×3 IMPLANT
PACK C SECTION WH (CUSTOM PROCEDURE TRAY) ×3 IMPLANT
PAD OB MATERNITY 4.3X12.25 (PERSONAL CARE ITEMS) ×3 IMPLANT
RETRACTOR WND ALEXIS 25 LRG (MISCELLANEOUS) ×1 IMPLANT
RTRCTR C-SECT PINK 34CM XLRG (MISCELLANEOUS) ×2 IMPLANT
RTRCTR WOUND ALEXIS 25CM LRG (MISCELLANEOUS) ×3
SPONGE DRAIN TRACH 4X4 STRL 2S (GAUZE/BANDAGES/DRESSINGS) ×2 IMPLANT
SPONGE LAP 18X18 X RAY DECT (DISPOSABLE) ×4 IMPLANT
STRIP CLOSURE SKIN 1/2X4 (GAUZE/BANDAGES/DRESSINGS) ×2 IMPLANT
SUT CHROMIC 2 0 CT 1 (SUTURE) ×3 IMPLANT
SUT MNCRL AB 3-0 PS2 27 (SUTURE) ×3 IMPLANT
SUT PLAIN 0 NONE (SUTURE) IMPLANT
SUT PLAIN 2 0 XLH (SUTURE) ×3 IMPLANT
SUT SILK 2 0 FSL 18 (SUTURE) ×2 IMPLANT
SUT VIC AB 0 CT1 36 (SUTURE) ×7 IMPLANT
SUT VIC AB 0 CTX 36 (SUTURE) ×9
SUT VIC AB 0 CTX36XBRD ANBCTRL (SUTURE) ×3 IMPLANT
TOWEL OR 17X24 6PK STRL BLUE (TOWEL DISPOSABLE) ×3 IMPLANT
TRAY FOLEY CATH 14FR (SET/KITS/TRAYS/PACK) ×3 IMPLANT
WATER STERILE IRR 1000ML POUR (IV SOLUTION) ×3 IMPLANT

## 2014-03-18 NOTE — Op Note (Addendum)
Cesarean Section Procedure Note  Indications: 25 1/7wks with severe preeclampsia and thrombocytopenia/hellp for csection secondary to h/o myomectomy and breech and recommended for delivery now by MFM after 2nd dose of BMZ.  Pre-operative Diagnosis: severe preeclampsia and thrombocytopenia/HELLP   Post-operative Diagnosis: 1.severe preeclampsia and thrombocytopenia/HELLP 2.Fibroids  Procedure: CESAREAN SECTION  Surgeon: Delice Lesch, MD    Assistants: Farrel Gordon, CNM  Anesthesia: Spinal  Anesthesiologist: Lyn Hollingshead, MD   Procedure Details  The patient was taken to the operating room secondary to HELLP after the risks, benefits, complications, treatment options, and expected outcomes were discussed with the patient.  The patient concurred with the proposed plan, giving informed consent which was signed and witnessed. The patient was taken to Operating Room C-Section Suite, identified as NIRALYA OHANIAN and the procedure verified as C-Section Delivery. A Time Out was held and the above information confirmed.  After induction of anesthesia by obtaining a spinal, the patient was prepped and draped in the usual sterile manner. A Pfannenstiel skin incision was made and carried down through the subcutaneous tissue to the underlying layer of fascia.  The fascia was incised bilaterally and extended transversely bilaterally with the Mayo scissors. Kocher clamps were placed on the inferior aspect of the fascial incision and the underlying rectus muscle was separated from the fascia. The same was done on the superior aspect of the fascial incision.  The peritoneum was identified, entered bluntly and extended manually.  An Alexis self-retaining retractor was placed.  The utero-vesical peritoneal reflection was incised transversely and the bladder flap was bluntly freed from the lower uterine segment. A low transverse uterine incision was made with the scalpel and extended bilaterally with  the bandage scissors into a U shape.  The infant was delivered via breech extraction position w.  After the umbilical cord was clamped and cut, the infant was handed to the awaiting pediatricians.  Cord blood was obtained for evaluation.  The placenta was removed intact and appeared to be within normal limits. The uterus was cleared of all clots and debris. Two submucosal fibroids were palpated one on the anterior wall and the other on the posterior wall.  The uterine incision was closed in 3 layers.  A running stitch of 0 vicryl was placed for the first layer, a running interlocking sutures of 0 Vicryl and a third imbricating layer was performed as well.   Intercede was placed on incision.  Bilateral tubes and ovaries appeared to be within normal limits.  Good hemostasis was noted.  Copious irrigation was performed until clear.  The peritoneum was repaired with 2-0 chromic via a running suture.  The fascia was reapproximated with a running suture of 0 Vicryl. The subcutaneous tissue was reapproximated with 3 interrupted sutures of 2-0 plain after placing a size 10 J-P drain.  The skin was reapproximated with a subcuticular suture of 3-0 monocryl.  Steristrips were applied.  Instrument, sponge, and needle counts were correct prior to abdominal closure and at the conclusion of the case.  The patient was awaiting transfer to the recovery room in good condition.  Findings: Live female infant with Apgars per NICU team.  Normal appearing bilateral ovaries and fallopian tubes were noted.  Estimated Blood Loss:  700 ml         Drains: foley to gravity 100 cc         Total IV Fluids: 1400 ml         Specimens to Pathology: 1.Placenta 2.Fibroid  Complications:  None; patient tolerated the procedure well.         Disposition: PACU - hemodynamically stable.         Condition: stable  Attending Attestation: I performed the procedure.

## 2014-03-18 NOTE — Progress Notes (Signed)
03/18/14 1500  Clinical Encounter Type  Visited With Patient and family together  Visit Type Spiritual support;Social support  Referral From Nurse  Spiritual Encounters  Spiritual Needs Emotional  Stress Factors  Patient Stress Factors Loss of control;Major life changes   Stacey Huerta was feeling positive and working to maintain good spirits as she awaits c-section this evening.  She and her mother both expressed gratitude for the quality of care they have received on this admission.  Provided empathic listening and encouragement.  [place] will continue to follow for support, but please page as needs arise.  [place], [place]

## 2014-03-18 NOTE — Anesthesia Preprocedure Evaluation (Addendum)
Anesthesia Evaluation  Patient identified by MRN, date of birth, ID band Patient awake    Reviewed: Allergy & Precautions, H&P , NPO status , Patient's Chart, lab work & pertinent test results  Airway Mallampati: II TM Distance: >3 FB Neck ROM: full    Dental no notable dental hx.    Pulmonary neg pulmonary ROS,    Pulmonary exam normal       Cardiovascular hypertension (severe preeclampsia and thrombocytopenia),     Neuro/Psych negative psych ROS   GI/Hepatic negative GI ROS, Neg liver ROS,   Endo/Other  Morbid obesity  Renal/GU negative Renal ROS     Musculoskeletal   Abdominal (+) + obese,   Peds  Hematology negative hematology ROS (+)   Anesthesia Other Findings   Reproductive/Obstetrics (+) Pregnancy (25 weeks, severe preeclampsia/thrombocytopenia --> C/S)                          Anesthesia Physical Anesthesia Plan  ASA: III  Anesthesia Plan: Combined Spinal and Epidural   Post-op Pain Management:    Induction:   Airway Management Planned:   Additional Equipment:   Intra-op Plan:   Post-operative Plan:   Informed Consent: I have reviewed the patients History and Physical, chart, labs and discussed the procedure including the risks, benefits and alternatives for the proposed anesthesia with the patient or authorized representative who has indicated his/her understanding and acceptance.     Plan Discussed with: CRNA and Surgeon  Anesthesia Plan Comments:         Anesthesia Quick Evaluation

## 2014-03-18 NOTE — Progress Notes (Signed)
CRITICAL VALUE ALERT  Critical value received: GBS  Date of notification:  03/18/14  Time of notification: 9449  Critical value read back:yes  Nurse who received alert:  Renee Kendrick told Kassie Mends, RN    MD notified (1st page):Kim Jimmye Norman, CNM  Time of first page: 1100  MD notified (2nd page):  Time of second page:  Responding MD:  Prince Solian, CNM  Time MD responded:  1100

## 2014-03-18 NOTE — Anesthesia Postprocedure Evaluation (Signed)
  Anesthesia Post-op Note Anesthesia Post Note  Patient: Stacey Huerta  Procedure(s) Performed: Procedure(s) (LRB): CESAREAN SECTION (N/A)  Anesthesia type: Spinal/Epidural  Patient location: PACU  Post pain: Pain level controlled  Post assessment: Post-op Vital signs reviewed  Post vital signs: Reviewed  Level of consciousness: awake  Complications: No apparent anesthesia complications

## 2014-03-18 NOTE — Transfer of Care (Signed)
Immediate Anesthesia Transfer of Care Note  Patient: Stacey Huerta  Procedure(s) Performed: Procedure(s) with comments: CESAREAN SECTION (N/A) - spinal/epidural  Patient Location: PACU  Anesthesia Type:Spinal and Epidural  Level of Consciousness: awake, alert  and oriented  Airway & Oxygen Therapy: Patient Spontanous Breathing and Patient connected to nasal cannula oxygen  Post-op Assessment: Report given to PACU RN and Post -op Vital signs reviewed and stable  Post vital signs: Reviewed and stable  Complications: No apparent anesthesia complications

## 2014-03-18 NOTE — Progress Notes (Addendum)
Hospital day # 2 pregnancy at [redacted]w[redacted]d--pre-eclampsia with severe features.  S:  Denies headache, blurred vision, RUQ pain, N/V, ctxs, VB or LOF. Reports active fetus and some lightheadedness       when supine and OOB. Tired from Magnesium. States last received Betamethasone at 6 p.m. Last evening.      Perception of contractions: none      Vaginal bleeding: none         O: BP 150/92  Pulse 99  Temp(Src) 98.9 F (37.2 C) (Oral)  Resp 18  Ht 5\' 9"  (1.753 m)  Wt 383 lb 8 oz (173.954 kg)  BMI 56.61 kg/m2  SpO2 95%      Gen: A&O x 3      Lungs: CTAB      CV: HRRR      Fetal tracings: Reassuring for this gestational age; FHR 130-150      Contractions:  None, however difficult to palpate due to habitus      Abdomen: Soft, NT      Extremities: 1+DTRs, +swelling, no clonus       SCDs in place       Intake this shift: 480 ml po; 625 ml IV      Output this shift: 400 ml          Labs today significant for:       24 hr urine: 9504 mg protein       Uric acid: 6.6      AST: 27        ALT: 16      LDH: 350      Hgb: 11.2      Plts: 123      GBS positive       Meds: Magnesium Sulfate                 Macrobid                 Protonix                 Colace                 PNV  A: [redacted]w[redacted]d with pre-eclampsia w/ severe features;     gradually worsening     GBS positive; allergic to PCN; unknown reaction     UTI     Steroid complete today at 6:05 p.m.   P:  Continue to follow MFM recommendations       Advise NICU of GBS positive status                Farrel Gordon CNM, MS 03/18/2014 12:28 PM   Agree with above.  Bedside u/s done and baby is breech/transverse head on rt.  Pt is 24hrs s/p 2nd dose of BMZ and MFM recs delivery so patient is scheduled to undergo c/s today.  R/B/A discussed including but not limited to bleeding, infection, and injury.  Pt verbalized understanding and consent signed and witnessed.  Questions answered.  NICU consult done yesterday evening.  Pt is ready  to proceed.  She has a pcn allergy so will give gent and clindamycin for surgical prophylaxis and pt has been crossed for 2u PRBCs.

## 2014-03-18 NOTE — Anesthesia Procedure Notes (Signed)
Spinal  Patient location during procedure: OR Start time: 03/18/2014 5:52 PM End time: 03/18/2014 5:55 PM Reason for block: procedure for pain Staffing Anesthesiologist: Lyn Hollingshead Performed by: anesthesiologist  Preanesthetic Checklist Completed: patient identified, surgical consent, pre-op evaluation, timeout performed, IV checked, risks and benefits discussed and monitors and equipment checked Spinal Block Patient position: sitting Prep: DuraPrep Patient monitoring: cardiac monitor, continuous pulse ox, blood pressure and heart rate Approach: midline Location: L3-4 Injection technique: catheter Needle Needle type: Tuohy and Sprotte  Needle gauge: 24 G Needle length: 12.7 cm Needle insertion depth: 10 cm Catheter type: closed end flexible Catheter size: 19 g Catheter at skin depth: 15 cm Assessment Sensory level: T6

## 2014-03-19 ENCOUNTER — Encounter (HOSPITAL_COMMUNITY): Payer: Self-pay | Admitting: Obstetrics and Gynecology

## 2014-03-19 LAB — COMPREHENSIVE METABOLIC PANEL
ALBUMIN: 2.4 g/dL — AB (ref 3.5–5.2)
ALBUMIN: 2.1 g/dL — AB (ref 3.5–5.2)
ALK PHOS: 87 U/L (ref 39–117)
ALK PHOS: 88 U/L (ref 39–117)
ALK PHOS: 89 U/L (ref 39–117)
ALK PHOS: 88 U/L (ref 39–117)
ALT: 88 U/L — ABNORMAL HIGH (ref 0–35)
ALT: 73 U/L — AB (ref 0–35)
ALT: 69 U/L — ABNORMAL HIGH (ref 0–35)
ALT: 56 U/L — AB (ref 0–35)
AST: 156 U/L — ABNORMAL HIGH (ref 0–37)
AST: 113 U/L — ABNORMAL HIGH (ref 0–37)
AST: 86 U/L — ABNORMAL HIGH (ref 0–37)
AST: 66 U/L — AB (ref 0–37)
Albumin: 2.2 g/dL — ABNORMAL LOW (ref 3.5–5.2)
Albumin: 2.3 g/dL — ABNORMAL LOW (ref 3.5–5.2)
BILIRUBIN TOTAL: 0.7 mg/dL (ref 0.3–1.2)
BUN: 12 mg/dL (ref 6–23)
BUN: 12 mg/dL (ref 6–23)
BUN: 11 mg/dL (ref 6–23)
BUN: 11 mg/dL (ref 6–23)
CALCIUM: 7.3 mg/dL — AB (ref 8.4–10.5)
CALCIUM: 7.4 mg/dL — AB (ref 8.4–10.5)
CHLORIDE: 101 meq/L (ref 96–112)
CO2: 24 mEq/L (ref 19–32)
CO2: 24 meq/L (ref 19–32)
CO2: 26 mEq/L (ref 19–32)
CO2: 25 mEq/L (ref 19–32)
CREATININE: 1.04 mg/dL (ref 0.50–1.10)
Calcium: 7.2 mg/dL — ABNORMAL LOW (ref 8.4–10.5)
Calcium: 7 mg/dL — ABNORMAL LOW (ref 8.4–10.5)
Chloride: 103 mEq/L (ref 96–112)
Chloride: 99 mEq/L (ref 96–112)
Chloride: 100 mEq/L (ref 96–112)
Creatinine, Ser: 0.99 mg/dL (ref 0.50–1.10)
Creatinine, Ser: 0.98 mg/dL (ref 0.50–1.10)
Creatinine, Ser: 0.98 mg/dL (ref 0.50–1.10)
GFR calc Af Amer: 82 mL/min — ABNORMAL LOW (ref >90)
GFR calc Af Amer: 83 mL/min — ABNORMAL LOW (ref >90)
GFR calc non Af Amer: 71 mL/min — ABNORMAL LOW (ref >90)
GFR calc non Af Amer: 72 mL/min — ABNORMAL LOW (ref >90)
GFR, EST AFRICAN AMERICAN: 83 mL/min — AB (ref >90)
GFR, EST AFRICAN AMERICAN: 77 mL/min — AB (ref >90)
GFR, EST NON AFRICAN AMERICAN: 72 mL/min — AB (ref >90)
GFR, EST NON AFRICAN AMERICAN: 67 mL/min — AB (ref >90)
GLUCOSE: 110 mg/dL — AB (ref 70–99)
GLUCOSE: 99 mg/dL (ref 70–99)
Glucose, Bld: 116 mg/dL — ABNORMAL HIGH (ref 70–99)
Glucose, Bld: 101 mg/dL — ABNORMAL HIGH (ref 70–99)
POTASSIUM: 4 meq/L (ref 3.7–5.3)
POTASSIUM: 3.8 meq/L (ref 3.7–5.3)
POTASSIUM: 3.8 meq/L (ref 3.7–5.3)
POTASSIUM: 3.5 meq/L — AB (ref 3.7–5.3)
SODIUM: 135 meq/L — AB (ref 137–147)
Sodium: 138 mEq/L (ref 137–147)
Sodium: 134 mEq/L — ABNORMAL LOW (ref 137–147)
Sodium: 135 mEq/L — ABNORMAL LOW (ref 137–147)
TOTAL PROTEIN: 5.2 g/dL — AB (ref 6.0–8.3)
TOTAL PROTEIN: 4.9 g/dL — AB (ref 6.0–8.3)
Total Bilirubin: 1.1 mg/dL (ref 0.3–1.2)
Total Bilirubin: 0.8 mg/dL (ref 0.3–1.2)
Total Bilirubin: 0.6 mg/dL (ref 0.3–1.2)
Total Protein: 5.2 g/dL — ABNORMAL LOW (ref 6.0–8.3)
Total Protein: 5 g/dL — ABNORMAL LOW (ref 6.0–8.3)

## 2014-03-19 LAB — CBC
HCT: 28.7 % — ABNORMAL LOW (ref 36.0–46.0)
HCT: 28.2 % — ABNORMAL LOW (ref 36.0–46.0)
HCT: 26.7 % — ABNORMAL LOW (ref 36.0–46.0)
HEMATOCRIT: 26.1 % — AB (ref 36.0–46.0)
HEMOGLOBIN: 9.3 g/dL — AB (ref 12.0–15.0)
HEMOGLOBIN: 9 g/dL — AB (ref 12.0–15.0)
Hemoglobin: 9.8 g/dL — ABNORMAL LOW (ref 12.0–15.0)
Hemoglobin: 9.6 g/dL — ABNORMAL LOW (ref 12.0–15.0)
MCH: 32.8 pg (ref 26.0–34.0)
MCH: 32.5 pg (ref 26.0–34.0)
MCH: 33.2 pg (ref 26.0–34.0)
MCH: 33.3 pg (ref 26.0–34.0)
MCHC: 34.1 g/dL (ref 30.0–36.0)
MCHC: 34 g/dL (ref 30.0–36.0)
MCHC: 34.8 g/dL (ref 30.0–36.0)
MCHC: 34.5 g/dL (ref 30.0–36.0)
MCV: 96 fL (ref 78.0–100.0)
MCV: 95.6 fL (ref 78.0–100.0)
MCV: 95.4 fL (ref 78.0–100.0)
MCV: 96.7 fL (ref 78.0–100.0)
PLATELETS: 93 10*3/uL — AB (ref 150–400)
PLATELETS: 92 10*3/uL — AB (ref 150–400)
Platelets: 100 10*3/uL — ABNORMAL LOW (ref 150–400)
Platelets: 96 10*3/uL — ABNORMAL LOW (ref 150–400)
RBC: 2.99 MIL/uL — ABNORMAL LOW (ref 3.87–5.11)
RBC: 2.95 MIL/uL — ABNORMAL LOW (ref 3.87–5.11)
RBC: 2.8 MIL/uL — ABNORMAL LOW (ref 3.87–5.11)
RBC: 2.7 MIL/uL — ABNORMAL LOW (ref 3.87–5.11)
RDW: 15.2 % (ref 11.5–15.5)
RDW: 15.2 % (ref 11.5–15.5)
RDW: 15.2 % (ref 11.5–15.5)
RDW: 14.4 % (ref 11.5–15.5)
WBC: 14.9 10*3/uL — ABNORMAL HIGH (ref 4.0–10.5)
WBC: 14.4 10*3/uL — ABNORMAL HIGH (ref 4.0–10.5)
WBC: 14.2 10*3/uL — ABNORMAL HIGH (ref 4.0–10.5)
WBC: 12.4 10*3/uL — AB (ref 4.0–10.5)

## 2014-03-19 LAB — CULTURE, BETA STREP (GROUP B ONLY)

## 2014-03-19 LAB — LACTATE DEHYDROGENASE
LDH: 557 U/L — ABNORMAL HIGH (ref 94–250)
LDH: 522 U/L — ABNORMAL HIGH (ref 94–250)
LDH: 476 U/L — ABNORMAL HIGH (ref 94–250)
LDH: 426 U/L — ABNORMAL HIGH (ref 94–250)

## 2014-03-19 LAB — URIC ACID
URIC ACID, SERUM: 6.8 mg/dL (ref 2.4–7.0)
URIC ACID, SERUM: 7.1 mg/dL — AB (ref 2.4–7.0)
Uric Acid, Serum: 6.6 mg/dL (ref 2.4–7.0)
Uric Acid, Serum: 6.6 mg/dL (ref 2.4–7.0)

## 2014-03-19 LAB — MAGNESIUM
MAGNESIUM: 5.6 mg/dL — AB (ref 1.5–2.5)
MAGNESIUM: 5.2 mg/dL — AB (ref 1.5–2.5)
MAGNESIUM: 4 mg/dL — AB (ref 1.5–2.5)
Magnesium: 5.4 mg/dL — ABNORMAL HIGH (ref 1.5–2.5)

## 2014-03-19 MED ORDER — SODIUM CHLORIDE 0.9 % IJ SOLN: 3.0000 mL | INTRAMUSCULAR | Status: DC | PRN

## 2014-03-19 MED ORDER — SODIUM CHLORIDE 0.9 % IJ SOLN
3.0000 mL | Freq: Two times a day (BID) | INTRAMUSCULAR | Status: DC
  Administered 2014-03-19 – 2014-03-20 (×2): 3 mL via INTRAVENOUS

## 2014-03-19 MED ORDER — MAGNESIUM SULFATE 40 G IN LACTATED RINGERS - SIMPLE
2.0000 g/h | INTRAVENOUS | Status: DC
Start: 2014-03-19 — End: 2014-03-19
  Administered 2014-03-19: 2 g/h via INTRAVENOUS
  Filled 2014-03-19: qty 500

## 2014-03-19 MED ORDER — SODIUM CHLORIDE 0.9 % IV SOLN: 250.0000 mL | INTRAVENOUS | Status: DC | PRN

## 2014-03-19 MED ORDER — LABETALOL HCL 100 MG PO TABS
100.0000 mg | ORAL_TABLET | Freq: Two times a day (BID) | ORAL | Status: DC
  Administered 2014-03-19 – 2014-03-21 (×5): 100 mg via ORAL
  Filled 2014-03-19 (×7): qty 1

## 2014-03-19 NOTE — Progress Notes (Signed)
Called Lab to check for CBC results.  Informed by lab that arterial stick in PACU was clotted.  Reported clotted labs to Dr Primitivo Gauze, order given to not pull epidural until CBC obtained resulted and reported to Anesthesia.  [place] [facility], RN to report Dr Primitivo Gauze does not want epidural pulled until CBC results.

## 2014-03-19 NOTE — Addendum Note (Signed)
Addendum created 03/19/14 8921 by Jonna Munro, CRNA   Modules edited: Charges VN, Notes Section   Notes Section:  File: [id number]

## 2014-03-19 NOTE — Progress Notes (Signed)
I visited with Stacey Huerta and her family both in her room and in the NICU.  She is feeling relieved that her baby seems to be doing well.  They are still processing all that has happened in the last 24 hours.  They did not report any particular needs at this time, but they were grateful for [place] checking in on them.    [employer] Pager, [phone] 3:40 PM   03/19/14 1500  Clinical Encounter Type  Visited With Patient and family together  Visit Type Spiritual support  Referral From Chaplain

## 2014-03-19 NOTE — Anesthesia Postprocedure Evaluation (Signed)
  Anesthesia Post-op Note  Patient: Stacey Huerta  Procedure(s) Performed: Procedure(s) with comments: CESAREAN SECTION (N/A) - spinal/epidural  Patient Location: ICU  Anesthesia Type:Epidural  Level of Consciousness: awake, alert  and oriented  Airway and Oxygen Therapy: Patient Spontanous Breathing  Post-op Pain: none  Post-op Assessment: Post-op Vital signs reviewed, Patient's Cardiovascular Status Stable, Respiratory Function Stable, No headache, No backache, No residual numbness and No residual motor weakness  Post-op Vital Signs: Reviewed and stable  Last Vitals:  Filed Vitals:   03/19/14 0700  BP: 150/94  Pulse: 86  Temp:   Resp: 16    Complications: No apparent anesthesia complications

## 2014-03-19 NOTE — Progress Notes (Signed)
Subjective: Postpartum Day 1: Cesarean Delivery Patient reports tolerating PO.  No headache or RUQ pain  Objective: Vital signs in last 24 hours: Temp:  [97.6 F (36.4 C)-98.6 F (37 C)] 98.4 F (36.9 C) (05/29 0835) Pulse Rate:  [84-99] 94 (05/29 1004) Resp:  [14-23] 18 (05/29 1004) BP: (129-167)/(62-105) 151/87 mmHg (05/29 1004) SpO2:  [90 %-100 %] 96 % (05/29 1004) Weight:  [176.021 kg (388 lb 0.9 oz)-176.676 kg (389 lb 8 oz)] 176.676 kg (389 lb 8 oz) (05/29 0500)  Physical Exam:  General: alert and cooperative Lochia: appropriate Uterine Fundus: firm Incision: healing well, no significant drainage DVT Evaluation: No evidence of DVT seen on physical exam. CV RRR Lungs CTAB   Recent Labs  03/18/14 1530 03/19/14 0507  HGB 11.4* 9.8*  HCT 32.4* 28.7*    Assessment/Plan: Status post Cesarean section. Postoperative course complicated by severe preeclampsia  Urine output is adequate. Continue labs q 6 hours.  Clinically she is stable.  There was an elevation in LFTS.  Will monitor. Started labetalol bercause of elevated blood pressures Darshawn Boateng A Anasia Agro 03/19/2014, 10:59 AM

## 2014-03-19 NOTE — Progress Notes (Signed)
Patient to have [place] labs drawn Q6hrs per MFM recommendations.  Stat CMP, CBC, UA, and LDH ordered at 2245 (3hrs post op) per consult with Dr. Scotty Court.  Standing lab orders changed to reflect future lab draws.  Upon attempting to review lab orders, noted that 2245 lab result was unavailable and lab order had been discontinued.  Marikay Alar, RN on unit who stated that lab reported sample had clotted x2. Maudie Mercury stated that she had arrived at 2300 and was under impression that all necessary providers had been notified.  However, this provider or on-call doctor received call from nursing staff or lab.  Patient to have labs drawn at 0430.  Nurse instructed to call if this attempt is unsuccessful.  Dr. Scotty Court updated.    Milinda Cave, CNM

## 2014-03-20 LAB — TYPE AND SCREEN
ABO/RH(D): O POS
Antibody Screen: NEGATIVE
Unit division: 0
Unit division: 0

## 2014-03-20 LAB — CBC
HCT: 26.9 % — ABNORMAL LOW (ref 36.0–46.0)
Hemoglobin: 9 g/dL — ABNORMAL LOW (ref 12.0–15.0)
MCH: 32.4 pg (ref 26.0–34.0)
MCHC: 33.5 g/dL (ref 30.0–36.0)
MCV: 96.8 fL (ref 78.0–100.0)
Platelets: 101 10*3/uL — ABNORMAL LOW (ref 150–400)
RBC: 2.78 MIL/uL — ABNORMAL LOW (ref 3.87–5.11)
RDW: 15.2 % (ref 11.5–15.5)
WBC: 13.3 10*3/uL — ABNORMAL HIGH (ref 4.0–10.5)

## 2014-03-20 MED ORDER — LABETALOL HCL 5 MG/ML IV SOLN
10.0000 mg | Freq: Once | INTRAVENOUS | Status: AC
  Administered 2014-03-20: 10 mg via INTRAVENOUS
  Filled 2014-03-20: qty 4

## 2014-03-20 MED ORDER — BACITRACIN-NEOMYCIN-POLYMYXIN OINTMENT TUBE
TOPICAL_OINTMENT | Freq: Three times a day (TID) | CUTANEOUS | Status: DC | PRN
  Administered 2014-03-20: 14:00:00 via TOPICAL
  Filled 2014-03-20: qty 15

## 2014-03-20 NOTE — Lactation Note (Signed)
This note was copied from the chart of Girl Zamani Crocker. Lactation Consultation Note: follow up visit with mom in [place]. Reports that she pumped 2 times yesterday- Encouraged to pump q 3 hours- 8 times/day to promote a good milk supply. Has not pumped yet this morning. Is eating breakfast- encouraged to pump after breakfast. No questions at present. To call prn  Patient Name: Girl Anzleigh Slaven [id number] Date: 03/20/2014 Reason for consult: Follow-up assessment;NICU baby   Maternal Data Formula Feeding for Exclusion: Yes Reason for exclusion: Admission to Intensive Care Unit (ICU) post-partum Does the patient have breastfeeding experience prior to this delivery?: No  Feeding    LATCH Score/Interventions                      Lactation Tools Discussed/Used     Consult Status Consult Status: Follow-up Date: 03/21/14 Follow-up type: In-patient    Truddie Crumble 03/20/2014, 10:09 AM

## 2014-03-20 NOTE — Progress Notes (Signed)
Subjective: Postpartum Day 2: Cesarean Delivery Patient reports tolerating PO and no problems voiding.    Objective: Vital signs in last 24 hours: Temp:  [97.8 F (36.6 C)-99.2 F (37.3 C)] 99.2 F (37.3 C) (05/30 0800) Pulse Rate:  [87-101] 96 (05/30 1038) Resp:  [16-20] 18 (05/30 1038) BP: (136-163)/(78-101) 136/80 mmHg (05/30 1038) SpO2:  [93 %-97 %] 95 % (05/30 1038) Weight:  [178.672 kg (393 lb 14.4 oz)] 178.672 kg (393 lb 14.4 oz) (05/30 0600)  Physical Exam:  General: alert and cooperative Lochia: appropriate Uterine Fundus: firm Incision: healing well, no significant drainage DVT Evaluation: No evidence of DVT seen on physical exam. cv RRR Lungs CTAB    Recent Labs  03/19/14 2230 03/20/14 0812  HGB 9.0* 9.0*  HCT 26.1* 26.9*    Assessment/Plan: Status post Cesarean section. Postoperative course complicated by preeclampsia.  great urine out put.  will transfer to the floor  Continue current care.  Stacey Huerta 03/20/2014, 11:29 AM

## 2014-03-20 NOTE — Progress Notes (Signed)
Pt c/o her thighs "feeling funny" bilaterally and it may be effecting her mobility. MDA in to see pt. Feels as if her symptoms are d/t to edema.  Pt OOB to BR without problems and remains independent. Will continue to monitor.

## 2014-03-21 MED ORDER — LABETALOL HCL 100 MG PO TABS: 200.0000 mg | ORAL_TABLET | Freq: Two times a day (BID) | ORAL | Status: DC

## 2014-03-21 MED ORDER — OXYCODONE-ACETAMINOPHEN 5-325 MG PO TABS: 1.0000 | ORAL_TABLET | ORAL | Status: DC | PRN

## 2014-03-21 MED ORDER — FERROUS SULFATE 325 (65 FE) MG PO TABS: 325.0000 mg | ORAL_TABLET | Freq: Two times a day (BID) | ORAL | Status: DC

## 2014-03-21 MED ORDER — SENNOSIDES-DOCUSATE SODIUM 8.6-50 MG PO TABS: 2.0000 | ORAL_TABLET | Freq: Two times a day (BID) | ORAL | Status: DC

## 2014-03-21 NOTE — Discharge Instructions (Signed)
Iron-Rich Diet An iron-rich diet contains foods that are good sources of iron. Iron is an important mineral that helps your body produce hemoglobin. Hemoglobin is a protein in red blood cells that carries oxygen to the body's tissues. Sometimes, the iron level in your blood can be low. This may be caused by:  A lack of iron in your diet.  Blood loss.  Times of growth, such as during pregnancy or during a child's growth and development. Low levels of iron can cause a decrease in the number of red blood cells. This can result in iron deficiency anemia. Iron deficiency anemia symptoms include:  Tiredness.  Weakness.  Irritability.  Increased chance of infection. Here are some recommendations for daily iron intake:  Males older than 40 years of age need 8 mg of iron per day.  Women ages 74 to 49 need 18 mg of iron per day.  Pregnant women need 27 mg of iron per day, and women who are over 40 years of age and breastfeeding need 9 mg of iron per day.  Women over the age of 5 need 8 mg of iron per day. SOURCES OF IRON There are 2 types of iron that are found in food: heme iron and nonheme iron. Heme iron is absorbed by the body better than nonheme iron. Heme iron is found in meat, poultry, and fish. Nonheme iron is found in grains, beans, and vegetables. Heme Iron Sources Food / Iron (mg)  Chicken liver, 3 oz (85 g)/ 10 mg  Beef liver, 3 oz (85 g)/ 5.5 mg  Oysters, 3 oz (85 g)/ 8 mg  Beef, 3 oz (85 g)/ 2 to 3 mg  Shrimp, 3 oz (85 g)/ 2.8 mg  [place], 3 oz (85 g)/ 2 mg  Chicken, 3 oz (85 g) / 1 mg  Fish (tuna, halibut), 3 oz (85 g)/ 1 mg  Pork, 3 oz (85 g)/ 0.9 mg Nonheme Iron Sources Food / Iron (mg)  Ready-to-eat breakfast cereal, iron-fortified / 3.9 to 7 mg  Tofu,  cup / 3.4 mg  Kidney beans,  cup / 2.6 mg  Baked potato with skin / 2.7 mg  Asparagus,  cup / 2.2 mg  Avocado / 2 mg  Dried peaches,  cup / 1.6 mg  Raisins,  cup / 1.5 mg  Soy milk, 1 cup  / 1.5 mg  Whole-wheat bread, 1 slice / 1.2 mg  Spinach, 1 cup / 0.8 mg  Broccoli,  cup / 0.6 mg IRON ABSORPTION Certain foods can decrease the body's absorption of iron. Try to avoid these foods and beverages while eating meals with iron-containing foods:  Coffee.  Tea.  Fiber.  Soy. Foods containing vitamin C can help increase the amount of iron your body absorbs from iron sources, especially from nonheme sources. Eat foods with vitamin C along with iron-containing foods to increase your iron absorption. Foods that are high in vitamin C include many fruits and vegetables. Some good sources are:  Fresh orange juice.  Oranges.  Strawberries.  Mangoes.  Grapefruit.  Red bell peppers.  Green bell peppers.  Broccoli.  Potatoes with skin.  Tomato juice. Document Released: 05/22/2005 Document Revised: 12/31/2011 Document Reviewed: 03/29/2011 [facility] Patient Information 2014 [place], [place]. Postpartum Depression and Baby Blues The postpartum period begins right after the birth of a baby. During this time, there is often a great amount of joy and excitement. It is also a time of considerable changes in the life of the parent(s). Regardless of  how many times a mother gives birth, each child brings new challenges and dynamics to the family. It is not unusual to have feelings of excitement accompanied by confusing shifts in moods, emotions, and thoughts. All mothers are at risk of developing postpartum depression or the "baby blues." These mood changes can occur right after giving birth, or they may occur many months after giving birth. The baby blues or postpartum depression can be mild or severe. Additionally, postpartum depression can resolve rather quickly, or it can be a long-term condition. CAUSES Elevated hormones and their rapid decline are thought to be a main cause of postpartum depression and the baby blues. There are a number of hormones that radically change during and  after pregnancy. Estrogen and progesterone usually decrease immediately after delivering your baby. The level of thyroid hormone and various cortisol steroids also rapidly drop. Other factors that play a major role in these changes include major life events and genetics.  RISK FACTORS If you have any of the following risks for the baby blues or postpartum depression, know what symptoms to watch out for during the postpartum period. Risk factors that may increase the likelihood of getting the baby blues or postpartum depression include:  Havinga personal or family history of depression.  Having depression while being pregnant.  Having premenstrual or oral contraceptive-associated mood issues.  Having exceptional life stress.  Having marital conflict.  Lacking a social support network.  Having a baby with special needs.  Having health problems such as diabetes. SYMPTOMS Baby blues symptoms include:  Brief fluctuations in mood, such as going from extreme happiness to sadness.  Decreased concentration.  Difficulty sleeping.  Crying spells, tearfulness.  Irritability.  Anxiety. Postpartum depression symptoms typically begin within the first month after giving birth. These symptoms include:  Difficulty sleeping or excessive sleepiness.  Marked weight loss.  Agitation.  Feelings of worthlessness.  Lack of interest in activity or food. Postpartum psychosis is a very concerning condition and can be dangerous. Fortunately, it is rare. Displaying any of the following symptoms is cause for immediate medical attention. Postpartum psychosis symptoms include:  Hallucinations and delusions.  Bizarre or disorganized behavior.  Confusion or disorientation. DIAGNOSIS  A diagnosis is made by an evaluation of your symptoms. There are no medical or lab tests that lead to a diagnosis, but there are various questionnaires that a caregiver may use to identify those with the baby blues,  postpartum depression, or psychosis. Often times, a screening tool called the [place] Postnatal Depression Scale is used to diagnose depression in the postpartum period.  TREATMENT The baby blues usually goes away on its own in 1 to 2 weeks. Social support is often all that is needed. You should be encouraged to get adequate sleep and rest. Occasionally, you may be given medicines to help you sleep.  Postpartum depression requires treatment as it can last several months or longer if it is not treated. Treatment may include individual or group therapy, medicine, or both to address any social, physiological, and psychological factors that may play a role in the depression. Regular exercise, a healthy diet, rest, and social support may also be strongly recommended.  Postpartum psychosis is more serious and needs treatment right away. Hospitalization is often needed. HOME CARE INSTRUCTIONS  Get as much rest as you can. Nap when the baby sleeps.  Exercise regularly. Some women find yoga and walking to be beneficial.  Eat a balanced and nourishing diet.  Do little things that you enjoy.  Have a cup of tea, take a bubble bath, read your favorite magazine, or listen to your favorite music.  Avoid alcohol.  Ask for help with household chores, cooking, grocery shopping, or running errands as needed. Do not try to do everything.  Talk to people close to you about how you are feeling. Get support from your partner, family members, friends, or other new moms.  Try to stay positive in how you think. Think about the things you are grateful for.  Do not spend a lot of time alone.  Only take medicine as directed by your caregiver.  Keep all your postpartum appointments.  Let your caregiver know if you have any concerns. SEEK MEDICAL CARE IF: You are having a reaction or problems with your medicine. SEEK IMMEDIATE MEDICAL CARE IF:  You have suicidal feelings.  You feel you may harm the baby or  someone else. Document Released: 07/12/2004 Document Revised: 12/31/2011 Document Reviewed: 07/20/2013 [facility] Patient Information 2014 [place], [place]. Postpartum Care After Cesarean Delivery After you deliver your newborn (postpartum period), the usual stay in the hospital is 24 72 hours. If there were problems with your labor or delivery, or if you have other medical problems, you might be in the hospital longer.  While you are in the hospital, you will receive help and instructions on how to care for yourself and your newborn during the postpartum period.  While you are in the hospital:  It is normal for you to have pain or discomfort from the incision in your abdomen. Be sure to tell your nurses when you are having pain, where the pain is located, and what makes the pain worse.  If you are breastfeeding, you may feel uncomfortable contractions of your uterus for a couple of weeks. This is normal. The contractions help your uterus get back to normal size.  It is normal to have some bleeding after delivery.  For the first 1 3 days after delivery, the flow is red and the amount may be similar to a period.  It is common for the flow to start and stop.  In the first few days, you may pass some small clots. Let your nurses know if you begin to pass large clots or your flow increases.  Do not  flush blood clots down the toilet before having the nurse look at them.  During the next 3 10 days after delivery, your flow should become more watery and pink or brown-tinged in color.  Ten to fourteen days after delivery, your flow should be a small amount of yellowish-white discharge.  The amount of your flow will decrease over the first few weeks after delivery. Your flow may stop in 6 8 weeks. Most women have had their flow stop by 12 weeks after delivery.  You should change your sanitary pads frequently.  Wash your hands thoroughly with soap and water for at least 20 seconds after changing  pads, using the toilet, or before holding or feeding your newborn.  Your intravenous (IV) tubing will be removed when you are drinking enough fluids.  The urine drainage tube (urinary catheter) that was inserted before delivery may be removed within 6 8 hours after delivery or when feeling returns to your legs. You should feel like you need to empty your bladder within the first 6 8 hours after the catheter has been removed.  In case you become weak, lightheaded, or faint, call your nurse before you get out of bed for the first time and  before you take a shower for the first time.  Within the first few days after delivery, your breasts may begin to feel tender and full. This is called engorgement. Breast tenderness usually goes away within 48 72 hours after engorgement occurs. You may also notice milk leaking from your breasts. If you are not breastfeeding, do not stimulate your breasts. Breast stimulation can make your breasts produce more milk.  Spending as much time as possible with your newborn is very important. During this time, you and your newborn can feel close and get to know each other. Having your newborn stay in your room (rooming in) will help to strengthen the bond with your newborn. It will give you time to get to know your newborn and become comfortable caring for your newborn.  Your hormones change after delivery. Sometimes the hormone changes can temporarily cause you to feel sad or tearful. These feelings should not last more than a few days. If these feelings last longer than that, you should talk to your caregiver.  If desired, talk to your caregiver about methods of family planning or contraception.  Talk to your caregiver about immunizations. Your caregiver may want you to have the following immunizations before leaving the hospital:  Tetanus, diphtheria, and pertussis (Tdap) or tetanus and diphtheria (Td) immunization. It is very important that you and your family  (including grandparents) or others caring for your newborn are up-to-date with the Tdap or Td immunizations. The Tdap or Td immunization can help protect your newborn from getting ill.  Rubella immunization.  Varicella (chickenpox) immunization.  Influenza immunization. You should receive this annual immunization if you did not receive the immunization during your pregnancy. Document Released: 07/02/2012 Document Reviewed: 07/02/2012 [facility] Patient Information 2014 [place], [place]. Preeclampsia and Eclampsia Preeclampsia is a condition of high blood pressure during pregnancy. It can happen at 20 weeks or later in pregnancy. If high blood pressure occurs in the second half of pregnancy with no other symptoms, it is called gestational hypertension and goes away after the baby is born. If any of the symptoms listed below develop with gestational hypertension, it is then called preeclampsia. Eclampsia (convulsions) may follow preeclampsia. This is one of the reasons for regular prenatal checkups. Early diagnosis and treatment are very important to prevent eclampsia. CAUSES  There is no known cause of preeclampsia/eclampsia in pregnancy. There are several known conditions that may put the pregnant woman at risk, such as:  The first pregnancy.  Having preeclampsia in a past pregnancy.  Having lasting (chronic) high blood pressure.  Having multiples (twins, triplets).  Being age 44 or older.  African American ethnic background.  Having kidney disease or diabetes.  Medical conditions such as lupus or blood diseases.  Being overweight (obese). SYMPTOMS   High blood pressure.  Headaches.  Sudden weight gain.  Swelling of hands, face, legs, and feet.  Protein in the urine.  Feeling sick to your stomach (nauseous) and throwing up (vomiting).  Vision problems (blurred or double vision).  Numbness in the face, arms, legs, and feet.  Dizziness.  Slurred speech.  Preeclampsia  can cause growth retardation in the fetus.  Separation (abruption) of the placenta.  Not enough fluid in the amniotic sac (oligohydramnios).  Sensitivity to bright lights.  Belly (abdominal) pain. DIAGNOSIS  If protein is found in the urine in the second half of pregnancy, this is considered preeclampsia. Other symptoms mentioned above may also be present. TREATMENT  It is necessary to treat this.  Your  caregiver may prescribe bed rest early in this condition. Plenty of rest and salt restriction may be all that is needed.  Medicines may be necessary to lower blood pressure if the condition does not respond to more conservative measures.  In more severe cases, hospitalization may be needed:  For treatment of blood pressure.  To control fluid retention.  To monitor the baby to see if the condition is causing harm to the baby.  Hospitalization is the best way to treat the first sign of preeclampsia. This is so the mother and baby can be watched closely and blood tests can be done effectively and correctly.  If the condition becomes severe, it may be necessary to induce labor or to remove the infant by surgical means (cesarean section). The best cure for preeclampsia/eclampsia is to deliver the baby. Preeclampsia and eclampsia involve risks to mother and infant. Your caregiver will discuss these risks with you. Together, you can work out the best possible approach to your problems. Make sure you keep your prenatal visits as scheduled. Not keeping appointments could result in a chronic or permanent injury, pain, disability to you, and death or injury to you or your unborn baby. If there is any problem keeping the appointment, you must call to reschedule. HOME CARE INSTRUCTIONS   Keep your prenatal appointments and tests as scheduled.  Tell your caregiver if you have any of the above risk factors.  Get plenty of rest and sleep.  Eat a balanced diet that is low in salt, and do not add  salt to your food.  Avoid stressful situations.  Only take over-the-counter and prescriptions medicines for pain, discomfort, or fever as directed by your caregiver. SEEK IMMEDIATE MEDICAL CARE IF:   You develop severe swelling anywhere in the body. This usually occurs in the legs.  You gain 05 lb/2.3 kg or more in a week.  You develop a severe headache, dizziness, problems with your vision, or confusion.  You have abdominal pain, nausea, or vomiting.  You have a seizure.  You have trouble moving any part of your body, or you develop numbness or problems speaking.  You have bruising or abnormal bleeding from anywhere in the body.  You develop a stiff neck.  You pass out. MAKE SURE YOU:   Understand these instructions.  Will watch your condition.  Will get help right away if you are not doing well or get worse. Document Released: 10/05/2000 Document Revised: 12/31/2011 Document Reviewed: 05/21/2008 [facility] Patient Information 2014 [place]. Contraception Choices Contraception (birth control) is the use of any methods or devices to prevent pregnancy. Below are some methods to help avoid pregnancy. HORMONAL METHODS   Contraceptive implant This is a thin, plastic tube containing progesterone hormone. It does not contain estrogen hormone. Your health care provider inserts the tube in the inner part of the upper arm. The tube can remain in place for up to 3 years. After 3 years, the implant must be removed. The implant prevents the ovaries from releasing an egg (ovulation), thickens the cervical mucus to prevent sperm from entering the uterus, and thins the lining of the inside of the uterus.  Progesterone-only injections These injections are given every 3 months by your health care provider to prevent pregnancy. This synthetic progesterone hormone stops the ovaries from releasing eggs. It also thickens cervical mucus and changes the uterine lining. This makes it harder for  sperm to survive in the uterus.  Birth control pills These pills contain estrogen and  progesterone hormone. They work by preventing the ovaries from releasing eggs (ovulation). They also cause the cervical mucus to thicken, preventing the sperm from entering the uterus. Birth control pills are prescribed by a health care provider.Birth control pills can also be used to treat heavy periods.  Minipill This type of birth control pill contains only the progesterone hormone. They are taken every day of each month and must be prescribed by your health care provider.  Birth control patch The patch contains hormones similar to those in birth control pills. It must be changed once a week and is prescribed by a health care provider.  Vaginal ring The ring contains hormones similar to those in birth control pills. It is left in the vagina for 3 weeks, removed for 1 week, and then a new one is put back in place. The patient must be comfortable inserting and removing the ring from the vagina.A health care provider's prescription is necessary.  Emergency contraception Emergency contraceptives prevent pregnancy after unprotected sexual intercourse. This pill can be taken right after sex or up to 5 days after unprotected sex. It is most effective the sooner you take the pills after having sexual intercourse. Most emergency contraceptive pills are available without a prescription. Check with your pharmacist. Do not use emergency contraception as your only form of birth control. BARRIER METHODS   Female condom This is a thin sheath (latex or rubber) that is worn over the penis during sexual intercourse. It can be used with spermicide to increase effectiveness.  Female condom. This is a soft, loose-fitting sheath that is put into the vagina before sexual intercourse.  Diaphragm This is a soft, latex, dome-shaped barrier that must be fitted by a health care provider. It is inserted into the vagina, along with a  spermicidal jelly. It is inserted before intercourse. The diaphragm should be left in the vagina for 6 to 8 hours after intercourse.  Cervical cap This is a round, soft, latex or plastic cup that fits over the cervix and must be fitted by a health care provider. The cap can be left in place for up to 48 hours after intercourse.  Sponge This is a soft, circular piece of polyurethane foam. The sponge has spermicide in it. It is inserted into the vagina after wetting it and before sexual intercourse.  Spermicides These are chemicals that kill or block sperm from entering the cervix and uterus. They come in the form of creams, jellies, suppositories, foam, or tablets. They do not require a prescription. They are inserted into the vagina with an applicator before having sexual intercourse. The process must be repeated every time you have sexual intercourse. INTRAUTERINE CONTRACEPTION  Intrauterine device (IUD) This is a T-shaped device that is put in a woman's uterus during a menstrual period to prevent pregnancy. There are 2 types:  Copper IUD This type of IUD is wrapped in copper wire and is placed inside the uterus. Copper makes the uterus and fallopian tubes produce a fluid that kills sperm. It can stay in place for 10 years.  Hormone IUD This type of IUD contains the hormone progestin (synthetic progesterone). The hormone thickens the cervical mucus and prevents sperm from entering the uterus, and it also thins the uterine lining to prevent implantation of a fertilized egg. The hormone can weaken or kill the sperm that get into the uterus. It can stay in place for 3 5 years, depending on which type of IUD is used. PERMANENT METHODS OF CONTRACEPTION  Female tubal ligation This is when the woman's fallopian tubes are surgically sealed, tied, or blocked to prevent the egg from traveling to the uterus.  Hysteroscopic sterilization This involves placing a small coil or insert into each fallopian tube.  Your doctor uses a technique called hysteroscopy to do the procedure. The device causes scar tissue to form. This results in permanent blockage of the fallopian tubes, so the sperm cannot fertilize the egg. It takes about 3 months after the procedure for the tubes to become blocked. You must use another form of birth control for these 3 months.  Female sterilization This is when the female has the tubes that carry sperm tied off (vasectomy).This blocks sperm from entering the vagina during sexual intercourse. After the procedure, the man can still ejaculate fluid (semen). NATURAL PLANNING METHODS  Natural family planning This is not having sexual intercourse or using a barrier method (condom, diaphragm, cervical cap) on days the woman could become pregnant.  Calendar method This is keeping track of the length of each menstrual cycle and identifying when you are fertile.  Ovulation method This is avoiding sexual intercourse during ovulation.  Symptothermal method This is avoiding sexual intercourse during ovulation, using a thermometer and ovulation symptoms.  Post ovulation method This is timing sexual intercourse after you have ovulated. Regardless of which type or method of contraception you choose, it is important that you use condoms to protect against the transmission of sexually transmitted infections (STIs). Talk with your health care provider about which form of contraception is most appropriate for you. Document Released: 10/08/2005 Document Revised: 06/10/2013 Document Reviewed: 04/02/2013 [facility] Patient Information 2014 [place].

## 2014-03-21 NOTE — Discharge Summary (Signed)
Cesarean Section Delivery Discharge Summary  Stacey Huerta  DOB:    August 17, 1976 MRN:    [id number] CSN:    [id number]  Date of admission:                  03/16/2014  Date of discharge:                   03/21/2014  Procedures this admission: Patient admitted for PreEclampsia with severe features.  Received BMZ course, Consults with MFM and NICU.  Patient underwent a primary c/s with PP MgSO4.     Date of Delivery: 03/18/2014  Newborn Data:  Live born female  Birth Weight: 1 lb 6.2 oz (629 g) APGAR: 1, 6  Infant remains in NICU. Name: Unknown Circumcision Plan: N/A  History of Present Illness:  Stacey Huerta is a 38 y.o. female, [phone], who presents at [redacted]w[redacted]d weeks gestation. The patient has been followed at the [facility] and Gynecology division of [facility].    Her pregnancy has been complicated by:  Patient Active Problem List   Diagnosis Date Noted  . Preterm infant, birth weight 500-749 grams, with 24 completed weeks of gestation 03/21/2014  . Preeclampsia 03/16/2014  . Morbid obesity 03/16/2014  . Thrombocytopenia, unspecified 03/16/2014  . 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 CHOLECYSTECTOMY, LAPAROSCOPIC, HX OF 08/06/2008  . OBESITY, UNSPECIFIED 04/06/2008    Hospital course:  The patient was admitted for PreEclampsia.   Her postpartum course was complicated by a need for MgSO4 prophylaxis and lab results suspecting of HELLP syndrome. Patient was also noted as having resistance to ambulation and required lots of nursing support and encouragement. She was discharged to home on postpartum day 3 doing well.  Feeding:  breast  Contraception:  Undecided, Information given  Discharge hemoglobin:  Hemoglobin  Date Value Ref Range Status  03/20/2014 9.0* 12.0 - 15.0 g/dL Final     HCT  Date Value Ref Range Status  03/20/2014 26.9* 36.0 - 46.0 % Final    Discharge Physical Exam:   General: alert,  cooperative, no distress and morbidly obese Lochia: appropriate Uterine Fundus: firm Incision: healing well, no significant drainage, no dehiscence, no significant erythema, honeycomb dressing removed, steri strips applied. JPeg removed with 35mL of serosanguinous fluid noted.  DVT Evaluation: No evidence of DVT seen on physical exam. Calf/Ankle edema is present.  Intrapartum Procedures: cesarean: low cervical, transverse Postpartum Procedures: MgSO4 Complications-Operative and Postpartum: none  Discharge Diagnoses: Preterm delivery by Primary C/S  Discharge Information:  Activity:           pelvic rest Diet:                routine Medications: Colace, Iron and Percocet Condition:      stable Instructions: Patient to have follow up appt, at [facility], in one week for B/P check and lab draw for liver function.   Care After Cesarean Delivery  Refer to this sheet in the next few weeks. These instructions provide you with information on caring for yourself after your procedure. Your caregiver may also give you specific instructions. Your treatment has been planned according to current medical practices, but problems sometimes occur. Call your caregiver if you have any problems or questions after you go home. HOME CARE INSTRUCTIONS  Only take over-the-counter or prescription medicines as directed by your caregiver.  Do not drink alcohol, especially if you are breastfeeding or taking medicine to relieve pain.  Do not chew or smoke tobacco.  Continue to use good perineal care. Good perineal care includes:  Wiping your perineum from front to back.  Keeping your perineum clean.  Check your cut (incision) daily for increased redness, drainage, swelling, or separation of skin.  Clean your incision gently with soap and water every day, and then pat it dry. If your caregiver says it is okay, leave the incision uncovered. Use a bandage (dressing) if the incision is draining fluid or appears  irritated. If the adhesive strips across the incision do not fall off within 7 days, carefully peel them off.  Hug a pillow when coughing or sneezing until your incision is healed. This helps to relieve pain.  Do not use tampons or douche until your caregiver says it is okay.  Shower, wash your hair, and take tub baths as directed by your caregiver.  Wear a well-fitting bra that provides breast support.  Limit wearing support panties or control-top hose.  Drink enough fluids to keep your urine clear or pale yellow.  Eat high-fiber foods such as whole grain cereals and breads, brown rice, beans, and fresh fruits and vegetables every day. These foods may help prevent or relieve constipation.  Resume activities such as climbing stairs, driving, lifting, exercising, or traveling as directed by your caregiver.  Talk to your caregiver about resuming sexual activities. This is dependent upon your risk of infection, your rate of healing, and your comfort and desire to resume sexual activity.  Try to have someone help you with your household activities and your newborn for at least a few days after you leave the hospital.  Rest as much as possible. Try to rest or take a nap when your newborn is sleeping.  Increase your activities gradually.  Keep all of your scheduled postpartum appointments. It is very important to keep your scheduled follow-up appointments. At these appointments, your caregiver will be checking to make sure that you are healing physically and emotionally. SEEK MEDICAL CARE IF:   You are passing large clots from your vagina. Save any clots to show your caregiver.  You have a foul smelling discharge from your vagina.  You have trouble urinating.  You are urinating frequently.  You have pain when you urinate.  You have a change in your bowel movements.  You have increasing redness, pain, or swelling near your incision.  You have pus draining from your  incision.  Your incision is separating.  You have painful, hard, or reddened breasts.  You have a severe headache.  You have blurred vision or see spots.  You feel sad or depressed.  You have thoughts of hurting yourself or your newborn.  You have questions about your care, the care of your newborn, or medicines.  You are dizzy or lightheaded.  You have a rash.  You have pain, redness, or swelling at the site of the removed intravenous access (IV) tube.  You have nausea or vomiting.  You stopped breastfeeding and have not had a menstrual period within 12 weeks of stopping.  You are not breastfeeding and have not had a menstrual period within 12 weeks of delivery.  You have a fever. SEEK IMMEDIATE MEDICAL CARE IF:  You have persistent pain.  You have chest pain.  You have shortness of breath.  You faint.  You have leg pain.  You have stomach pain.  Your vaginal bleeding saturates 2 or more sanitary pads in 1 hour. MAKE SURE YOU:   Understand these instructions.  Will watch your condition.  Will get help right away if you are not doing well or get worse. Document Released: 06/30/2002 Document Revised: 07/02/2012 Document Reviewed: 06/04/2012 [facility] Patient Information 2014 [place].   Postpartum Depression and Baby Blues  The postpartum period begins right after the birth of a baby. During this time, there is often a great amount of joy and excitement. It is also a time of considerable changes in the life of the parent(s). Regardless of how many times a mother gives birth, each child brings new challenges and dynamics to the family. It is not unusual to have feelings of excitement accompanied by confusing shifts in moods, emotions, and thoughts. All mothers are at risk of developing postpartum depression or the "baby blues." These mood changes can occur right after giving birth, or they may occur many months after giving birth. The baby blues or  postpartum depression can be mild or severe. Additionally, postpartum depression can resolve rather quickly, or it can be a Huerta-term condition. CAUSES Elevated hormones and their rapid decline are thought to be a main cause of postpartum depression and the baby blues. There are a number of hormones that radically change during and after pregnancy. Estrogen and progesterone usually decrease immediately after delivering your baby. The level of thyroid hormone and various cortisol steroids also rapidly drop. Other factors that play a major role in these changes include major life events and genetics.  RISK FACTORS If you have any of the following risks for the baby blues or postpartum depression, know what symptoms to watch out for during the postpartum period. Risk factors that may increase the likelihood of getting the baby blues or postpartum depression include:  Havinga personal or family history of depression.  Having depression while being pregnant.  Having premenstrual or oral contraceptive-associated mood issues.  Having exceptional life stress.  Having marital conflict.  Lacking a social support network.  Having a baby with special needs.  Having health problems such as diabetes. SYMPTOMS Baby blues symptoms include:  Brief fluctuations in mood, such as going from extreme happiness to sadness.  Decreased concentration.  Difficulty sleeping.  Crying spells, tearfulness.  Irritability.  Anxiety. Postpartum depression symptoms typically begin within the first month after giving birth. These symptoms include:  Difficulty sleeping or excessive sleepiness.  Marked weight loss.  Agitation.  Feelings of worthlessness.  Lack of interest in activity or food. Postpartum psychosis is a very concerning condition and can be dangerous. Fortunately, it is rare. Displaying any of the following symptoms is cause for immediate medical attention. Postpartum psychosis symptoms  include:  Hallucinations and delusions.  Bizarre or disorganized behavior.  Confusion or disorientation. DIAGNOSIS  A diagnosis is made by an evaluation of your symptoms. There are no medical or lab tests that lead to a diagnosis, but there are various questionnaires that a caregiver may use to identify those with the baby blues, postpartum depression, or psychosis. Often times, a screening tool called the [place] Postnatal Depression Scale is used to diagnose depression in the postpartum period.  TREATMENT The baby blues usually goes away on its own in 1 to 2 weeks. Social support is often all that is needed. You should be encouraged to get adequate sleep and rest. Occasionally, you may be given medicines to help you sleep.  Postpartum depression requires treatment as it can last several months or longer if it is not treated. Treatment may include individual or group therapy, medicine, or both to address any social, physiological,  and psychological factors that may play a role in the depression. Regular exercise, a healthy diet, rest, and social support may also be strongly recommended.  Postpartum psychosis is more serious and needs treatment right away. Hospitalization is often needed. HOME CARE INSTRUCTIONS  Get as much rest as you can. Nap when the baby sleeps.  Exercise regularly. Some women find yoga and walking to be beneficial.  Eat a balanced and nourishing diet.  Do little things that you enjoy. Have a cup of tea, take a bubble bath, read your favorite magazine, or listen to your favorite music.  Avoid alcohol.  Ask for help with household chores, cooking, grocery shopping, or running errands as needed. Do not try to do everything.  Talk to people close to you about how you are feeling. Get support from your partner, family members, friends, or other new moms.  Try to stay positive in how you think. Think about the things you are grateful for.  Do not spend a lot of time  alone.  Only take medicine as directed by your caregiver.  Keep all your postpartum appointments.  Let your caregiver know if you have any concerns. SEEK MEDICAL CARE IF: You are having a reaction or problems with your medicine. SEEK IMMEDIATE MEDICAL CARE IF:  You have suicidal feelings.  You feel you may harm the baby or someone else. Document Released: 07/12/2004 Document Revised: 12/31/2011 Document Reviewed: 08/14/2011 [facility] Patient Information 2014 [place], [place].  Discharge to: home  Follow-up Information   Follow up with [facility]. Schedule an appointment as soon as possible for a visit in 6 weeks. (Please call if you have any questions or concerns prior to your next visit.  We will contact you for blood pressure check.)    Specialty:  Obstetrics and Gynecology   Contact information:   [address] [place] [address] [phone]       Gavin Pound, CNM, MSN 03/21/2014

## 2014-03-21 NOTE — Progress Notes (Signed)
Discharge instructions provided to patient and family at bedside.  Activity, medications, incision care, when to call the doctor, follow up appointments and community resources discussed.  No questions at this time.  Assisted patient to NICU to see baby before leaving.  Patient left unit in wheelchair in stable condition with all personal belongings, breast pump and prescription accompanied by staff. NICU RN phone Womens Unit, patient requesting wheelchair.  Encouraged patient to walk.  Walked with patient and emphasized the importance of walking everyday to prevent blood clots and surgical complications.  Leighton Roach, RN------------------

## 2014-03-21 NOTE — Progress Notes (Signed)
Clinical Social Work Department PSYCHOSOCIAL ASSESSMENT - MATERNAL/CHILD 03/21/2014  Patient:  Stacey Huerta, Stacey Huerta  Account Number:  [id number]  [place] Date:  03/16/2014  Ardine Eng Name:   Denita Lung    Clinical Social Worker:  Alante Weimann, LCSW   Date/Time:  03/21/2014 02:30 PM  Date Referred:  03/21/2014   Referral source  NICU     Referred reason  NICU   Other referral source:    I:  FAMILY / [place] legal guardian:  [place] - Name [place] - Age [place] [address] [place],  [place], [place] [address]  Sharol Roussel  Same as above   Other household support members/support persons Other support:   Family is reportedly supportive    II  PSYCHOSOCIAL DATA Information Source:    Occupational hygienist Employment:   Mother is employed   Museum/gallery curator resources:  Multimedia programmer If [place] / Grade:   Maternity Care Coordinator / Child Services Coordination / Early Interventions:  Cultural issues impacting care:    III  STRENGTHS Strengths  Supportive family/friends  Adequate Resources  Compliance with medical plan   Strength comment:    IV  RISK FACTORS AND CURRENT PROBLEMS Current Problem:       V  SOCIAL WORK ASSESSMENT Met with both parents.  They were pleasant and receptive to social work intervention.  Parents are not married, but cohabitate.  Mother has no other dependents.   Both parents seems emotionally guarded.  They did not express any concerns regarding the NICU experience.  Informed that they have spoken with the medical team.   Mother communicates no concerns with transportation.   No acute social concerns related at this time.  Parents informed of social work Fish farm manager.      VI SOCIAL WORK PLAN Social Work Plan  Psychosocial Support/Ongoing Assessment of Needs   Type of pt/family education:   Parents informed of the benefits of  WIC and SSI.  Provided them with information on both WIC and SSI   If child protective services report - county:   If child protective services report - date:   Information/referral to community resources comment:   Other social work plan:    Will continue to follow for support PRN

## 2014-04-07 NOTE — Addendum Note (Signed)
Addendum created 04/07/14 3299 by Assunta Gambles, MD   Modules edited: Anesthesia Attestations

## 2014-08-19 ENCOUNTER — Ambulatory Visit (INDEPENDENT_AMBULATORY_CARE_PROVIDER_SITE_OTHER): Payer: BC Managed Care – PPO | Admitting: Emergency Medicine

## 2014-08-19 VITALS — BP 122/84 | HR 98 | Temp 99.3°F | Resp 18 | Ht 68.25 in | Wt 313.0 lb

## 2014-08-19 DIAGNOSIS — R197 Diarrhea, unspecified: Secondary | ICD-10-CM

## 2014-08-19 DIAGNOSIS — K529 Noninfective gastroenteritis and colitis, unspecified: Secondary | ICD-10-CM

## 2014-08-19 LAB — COMPREHENSIVE METABOLIC PANEL
ALT: 14 U/L (ref 0–35)
AST: 15 U/L (ref 0–37)
Albumin: 4 g/dL (ref 3.5–5.2)
Alkaline Phosphatase: 64 U/L (ref 39–117)
BILIRUBIN TOTAL: 1.2 mg/dL (ref 0.2–1.2)
BUN: 6 mg/dL (ref 6–23)
CO2: 25 mEq/L (ref 19–32)
CREATININE: 0.98 mg/dL (ref 0.50–1.10)
Calcium: 9.5 mg/dL (ref 8.4–10.5)
Chloride: 100 mEq/L (ref 96–112)
Glucose, Bld: 92 mg/dL (ref 70–99)
Potassium: 4 mEq/L (ref 3.5–5.3)
Sodium: 137 mEq/L (ref 135–145)
Total Protein: 7.9 g/dL (ref 6.0–8.3)

## 2014-08-19 LAB — POCT CBC
Granulocyte percent: 61.9 %G (ref 37–80)
HCT, POC: 41.4 % (ref 37.7–47.9)
Hemoglobin: 13.3 g/dL (ref 12.2–16.2)
Lymph, poc: 2 (ref 0.6–3.4)
MCH, POC: 30 pg (ref 27–31.2)
MCHC: 32.1 g/dL (ref 31.8–35.4)
MCV: 93.6 fL (ref 80–97)
MID (CBC): 0.3 (ref 0–0.9)
MPV: 9.6 fL (ref 0–99.8)
POC Granulocyte: 3.8 (ref 2–6.9)
POC LYMPH %: 33.5 % (ref 10–50)
POC MID %: 4.6 % (ref 0–12)
Platelet Count, POC: 186 10*3/uL (ref 142–424)
RBC: 4.42 M/uL (ref 4.04–5.48)
RDW, POC: 12.8 %
WBC: 6.1 10*3/uL (ref 4.6–10.2)

## 2014-08-19 MED ORDER — LOPERAMIDE HCL 2 MG PO TABS: ORAL_TABLET | ORAL | Status: DC

## 2014-08-19 MED ORDER — ONDANSETRON 8 MG PO TBDP: 8.0000 mg | ORAL_TABLET | Freq: Three times a day (TID) | ORAL | Status: DC | PRN

## 2014-08-19 NOTE — Progress Notes (Signed)
[facility] [address], [place]  [address] [phone]  Date:  08/19/2014   Name:  Stacey Huerta   DOB:  01/12/1976   MRN:  [id number]  PCP:  Garnet Koyanagi, DO    Chief Complaint: Abdominal Pain, Fever, Nausea and Diarrhea   History of Present Illness:  Stacey Huerta is a 38 y.o. very pleasant female patient who presents with the following:  Ill since Friday with bloating and fever by the weekend temp was 104 and she had frequent watery loose stools. The patient has no complaint of blood, mucous, or pus in her stools. No nausea or vomiting.  Says symptoms improved until she attempted to eat some fast food yesterday. Now has recurrent diarrhea. No ill contacts No exotic water or travel. Feels fatigued and feverish. No improvement with over the counter medications or other home remedies. Denies other complaint or health concern today.    Patient Active Problem List   Diagnosis Date Noted  . Preterm infant, birth weight 500-749 grams, with 24 completed weeks of gestation 03/21/2014  . Preeclampsia 03/16/2014  . Morbid obesity 03/16/2014  . Thrombocytopenia, unspecified 03/16/2014  . 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 CHOLECYSTECTOMY, LAPAROSCOPIC, HX OF 08/06/2008  . OBESITY, UNSPECIFIED 04/06/2008    Past Medical History  Diagnosis Date  . Headache(784.0)   . Fibroid     Past Surgical History  Procedure Laterality Date  . Cholecystectomy    . Myomectomy    . Myomectomy  05/23/2012    Procedure: MYOMECTOMY;  Surgeon: Governor Specking, MD;  Location: [facility];  Service: Gynecology;  Laterality: N/A;  . Lymphadenectomy      Removed from the neck at age 62  . Abdominal adhesion surgery    . Endometrial ablation    . Cesarean section N/A 03/18/2014    Procedure: CESAREAN SECTION;  Surgeon: Delice Lesch, MD;  Location: [facility];  Service: Obstetrics;  Laterality: N/A;  spinal/epidural    History  Substance Use Topics  .  Smoking status: Never Smoker   . Smokeless tobacco: Never Used  . Alcohol Use: Yes     Comment: socially    Family History  Problem Relation Age of Onset  . Hypertension Paternal Grandfather   . Diabetes Paternal Grandfather   . Diabetes Father   . Hypotension Father   . Colon cancer Paternal Grandmother   . Breast cancer Maternal Aunt   . Prostate cancer Maternal Grandfather   . Arthritis Paternal Aunt   . Hyperlipidemia Mother   . Hyperlipidemia Maternal Aunt     x's 2    Allergies  Allergen Reactions  . Eggs Or Egg-Derived Products   . Sulfonamide Derivatives Hives    Also high fever  . Other     Mushrooms and [place] Nuts  . Penicillins     Childhood reaction  . Shellfish Allergy Nausea And Vomiting    Medication list has been reviewed and updated.  Current Outpatient Prescriptions on File Prior to Visit  Medication Sig Dispense Refill  . Cholecalciferol (VITAMIN D) 2000 UNITS CAPS Take 2,000 Units by mouth daily.      . vitamin C (ASCORBIC ACID) 250 MG tablet Take 250 mg by mouth daily. Every other day      . oxyCODONE-acetaminophen (PERCOCET/ROXICET) 5-325 MG per tablet Take 1-2 tablets by mouth every 4 (four) hours as needed for severe pain (moderate - severe pain).  60 tablet  0   No  current facility-administered medications on file prior to visit.    Review of Systems:  As per HPI, otherwise negative.    Physical Examination: Filed Vitals:   08/19/14 1016  BP: 122/84  Pulse: 98  Temp: 99.3 F (37.4 C)  Resp: 18   Filed Vitals:   08/19/14 1016  Height: 5' 8.25" (1.734 m)  Weight: 313 lb (141.976 kg)   Body mass index is 47.22 kg/(m^2). Ideal Body Weight: Weight in (lb) to have BMI = 25: 165.3  GEN: morbid obesity, NAD, Non-toxic, A & O x 3 HEENT: Atraumatic, Normocephalic. Neck supple. No masses, No LAD. Ears and Nose: No external deformity. CV: RRR, No M/G/R. No JVD. No thrill. No extra heart sounds. PULM: CTA B, no wheezes, crackles,  rhonchi. No retractions. No resp. distress. No accessory muscle use. ABD: S, NT, ND, +BS. No rebound. No HSM. EXTR: No c/c/e NEURO Normal gait.  PSYCH: Normally interactive. Conversant. Not depressed or anxious appearing.  Calm demeanor.    Assessment and Plan: Gastroenteritis Labs pending Imodium zofran Clears   Signed,  Ellison Carwin, MD   Results for orders placed in visit on 08/19/14  POCT CBC      Result Value Ref Range   WBC 6.1  4.6 - 10.2 K/uL   Lymph, poc 2.0  0.6 - 3.4   POC LYMPH PERCENT 33.5  10 - 50 %L   MID (cbc) 0.3  0 - 0.9   POC MID % 4.6  0 - 12 %M   POC Granulocyte 3.8  2 - 6.9   Granulocyte percent 61.9  37 - 80 %G   RBC 4.42  4.04 - 5.48 M/uL   Hemoglobin 13.3  12.2 - 16.2 g/dL   HCT, POC 41.4  37.7 - 47.9 %   MCV 93.6  80 - 97 fL   MCH, POC 30.0  27 - 31.2 pg   MCHC 32.1  31.8 - 35.4 g/dL   RDW, POC 12.8     Platelet Count, POC 186  142 - 424 K/uL   MPV 9.6  0 - 99.8 fL

## 2014-08-19 NOTE — Patient Instructions (Signed)
Clear Liquid Diet A clear liquid diet is a short-term diet that is prescribed to provide the necessary fluid and basic energy you need when you can have nothing else. The clear liquid diet consists of liquids or solids that will become liquid at room temperature. You should be able to see through the liquid. There are many reasons that you may be restricted to clear liquids, such as:  When you have a sudden-onset (acute) condition that occurs before or after surgery.  To help your body slowly get adjusted to food again after a long period when you were unable to have food.  Replacement of fluids when you have a diarrheal disease.  When you are going to have certain exams, such as a colonoscopy, in which instruments are inserted inside your body to look at parts of your digestive system. WHAT CAN I HAVE? A clear liquid diet does not provide all the nutrients you need. It is important to choose a variety of the following items to get as many nutrients as possible:  Vegetable juices that do not have pulp.  Fruit juices and fruit drinks that do not have pulp.  Coffee (regular or decaffeinated), tea, or soda at the discretion of your health care provider.  Clear bouillon, broth, or strained broth-based soups.  High-protein and flavored gelatins.  Sugar or honey.  Ices or frozen ice pops that do not contain milk. If you are not sure whether you can have certain items, you should ask your health care provider. You may also ask your health care provider if there are any other clear liquid options. Document Released: 10/08/2005 Document Revised: 10/13/2013 Document Reviewed: 09/04/2013 ExitCare Patient Information 2015 [employer]. This information is not intended to replace advice given to you by your health care provider. Make sure you discuss any questions you have with your health care provider. Viral Gastroenteritis Viral gastroenteritis is also known as stomach flu. This condition  affects the stomach and intestinal tract. It can cause sudden diarrhea and vomiting. The illness typically lasts 3 to 8 days. Most people develop an immune response that eventually gets rid of the virus. While this natural response develops, the virus can make you quite ill. CAUSES  Many different viruses can cause gastroenteritis, such as rotavirus or noroviruses. You can catch one of these viruses by consuming contaminated food or water. You may also catch a virus by sharing utensils or other personal items with an infected person or by touching a contaminated surface. SYMPTOMS  The most common symptoms are diarrhea and vomiting. These problems can cause a severe loss of body fluids (dehydration) and a body salt (electrolyte) imbalance. Other symptoms may include:  Fever.  Headache.  Fatigue.  Abdominal pain. DIAGNOSIS  Your caregiver can usually diagnose viral gastroenteritis based on your symptoms and a physical exam. A stool sample may also be taken to test for the presence of viruses or other infections. TREATMENT  This illness typically goes away on its own. Treatments are aimed at rehydration. The most serious cases of viral gastroenteritis involve vomiting so severely that you are not able to keep fluids down. In these cases, fluids must be given through an intravenous line (IV). HOME CARE INSTRUCTIONS   Drink enough fluids to keep your urine clear or pale yellow. Drink small amounts of fluids frequently and increase the amounts as tolerated.  Ask your caregiver for specific rehydration instructions.  Avoid:  Foods high in sugar.  Alcohol.  Carbonated drinks.  Tobacco.  Juice.    Caffeine drinks.  Extremely hot or cold fluids.  Fatty, greasy foods.  Too much intake of anything at one time.  Dairy products until 24 to 48 hours after diarrhea stops.  You may consume probiotics. Probiotics are active cultures of beneficial bacteria. They may lessen the amount and  number of diarrheal stools in adults. Probiotics can be found in yogurt with active cultures and in supplements.  Wash your hands well to avoid spreading the virus.  Only take over-the-counter or prescription medicines for pain, discomfort, or fever as directed by your caregiver. Do not give aspirin to children. Antidiarrheal medicines are not recommended.  Ask your caregiver if you should continue to take your regular prescribed and over-the-counter medicines.  Keep all follow-up appointments as directed by your caregiver. SEEK IMMEDIATE MEDICAL CARE IF:   You are unable to keep fluids down.  You do not urinate at least once every 6 to 8 hours.  You develop shortness of breath.  You notice blood in your stool or vomit. This may look like coffee grounds.  You have abdominal pain that increases or is concentrated in one small area (localized).  You have persistent vomiting or diarrhea.  You have a fever.  The patient is a child younger than 3 months, and he or she has a fever.  The patient is a child older than 3 months, and he or she has a fever and persistent symptoms.  The patient is a child older than 3 months, and he or she has a fever and symptoms suddenly get worse.  The patient is a baby, and he or she has no tears when crying. MAKE SURE YOU:   Understand these instructions.  Will watch your condition.  Will get help right away if you are not doing well or get worse. Document Released: 10/08/2005 Document Revised: 12/31/2011 Document Reviewed: 07/25/2011 ExitCare Patient Information 2015 [employer]. This information is not intended to replace advice given to you by your health care provider. Make sure you discuss any questions you have with your health care provider.  

## 2014-08-24 ENCOUNTER — Telehealth: Payer: Self-pay | Admitting: Radiology

## 2014-08-24 MED ORDER — AZITHROMYCIN 500 MG PO TABS: 500.0000 mg | ORAL_TABLET | Freq: Every day | ORAL | Status: AC

## 2014-08-24 NOTE — Telephone Encounter (Signed)
Got a call from [facility] - pt has Campylobacter on her stool culture.  No answer LMOM. (labs please send HD sheet)  Pt needs treatment.  I will send in 3 days of zithromax and that should cover for the infection ([facility] [place]).  She needs to be very careful with people in her household and if they get symptoms they need to be evaluated.

## 2014-08-24 NOTE — Telephone Encounter (Signed)
Pt calling about lab results. Still not feeling well.

## 2014-08-25 ENCOUNTER — Other Ambulatory Visit: Payer: Self-pay | Admitting: Emergency Medicine

## 2014-08-25 LAB — STOOL CULTURE

## 2014-08-25 MED ORDER — LEVOFLOXACIN 500 MG PO TABS: 500.0000 mg | ORAL_TABLET | Freq: Every day | ORAL | Status: AC

## 2014-08-25 NOTE — Telephone Encounter (Signed)
If you check, i sent a prescription for Levaquin (a better option) Needs report to health department

## 2014-08-26 NOTE — Telephone Encounter (Signed)
Spoke with pt. Zithromax was sent in 11/3; Levaquin on 11/4. She went to the pharm yesterday and picked up them both (not knowing). She has only started taking the Zithromax. Per Windell Hummingbird, advised to finish the azithromax (since she has started it) and if she is still having any sxs, start Levaquin.

## 2014-08-26 NOTE — Telephone Encounter (Signed)
Zithromax had already been sent to pharm. Called pt to see if she had already picked this up. [facility] card faxed.

## 2014-09-09 ENCOUNTER — Emergency Department (HOSPITAL_COMMUNITY): Payer: BC Managed Care – PPO

## 2014-09-09 ENCOUNTER — Encounter (HOSPITAL_COMMUNITY): Payer: Self-pay | Admitting: *Deleted

## 2014-09-09 ENCOUNTER — Emergency Department (HOSPITAL_COMMUNITY)
Admission: EM | Admit: 2014-09-09 | Discharge: 2014-09-09 | Disposition: A | Discharge status: Home / Self Care | Payer: BC Managed Care – PPO | Attending: Emergency Medicine | Admitting: Emergency Medicine

## 2014-09-09 DIAGNOSIS — Y92410 Unspecified street and highway as the place of occurrence of the external cause: Secondary | ICD-10-CM | POA: Insufficient documentation

## 2014-09-09 DIAGNOSIS — Y9389 Activity, other specified: Secondary | ICD-10-CM | POA: Diagnosis not present

## 2014-09-09 DIAGNOSIS — Z880 Allergy status to penicillin: Secondary | ICD-10-CM | POA: Insufficient documentation

## 2014-09-09 DIAGNOSIS — S299XXA Unspecified injury of thorax, initial encounter: Secondary | ICD-10-CM | POA: Insufficient documentation

## 2014-09-09 DIAGNOSIS — Z79899 Other long term (current) drug therapy: Secondary | ICD-10-CM | POA: Diagnosis not present

## 2014-09-09 DIAGNOSIS — M7918 Myalgia, other site: Secondary | ICD-10-CM

## 2014-09-09 DIAGNOSIS — Y998 Other external cause status: Secondary | ICD-10-CM | POA: Diagnosis not present

## 2014-09-09 MED ORDER — HYDROCODONE-ACETAMINOPHEN 5-325 MG PO TABS: ORAL_TABLET | ORAL | Status: DC
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
## 2014-09-09 NOTE — ED Notes (Signed)
Pt. Returned from xray 
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
## 2014-11-08 ENCOUNTER — Ambulatory Visit (INDEPENDENT_AMBULATORY_CARE_PROVIDER_SITE_OTHER): Payer: BLUE CROSS/BLUE SHIELD | Admitting: Family Medicine

## 2014-11-08 ENCOUNTER — Encounter: Payer: Self-pay | Admitting: Family Medicine

## 2014-11-08 VITALS — BP 118/70 | HR 92 | Temp 99.4°F | Wt 329.0 lb

## 2014-11-08 DIAGNOSIS — Z8759 Personal history of other complications of pregnancy, childbirth and the puerperium: Secondary | ICD-10-CM

## 2014-11-08 LAB — COMPREHENSIVE METABOLIC PANEL
ALBUMIN: 3.6 g/dL (ref 3.5–5.2)
ALT: 33 U/L (ref 0–35)
AST: 30 U/L (ref 0–37)
Alkaline Phosphatase: 71 U/L (ref 39–117)
BUN: 11 mg/dL (ref 6–23)
CO2: 27 mEq/L (ref 19–32)
CREATININE: 0.84 mg/dL (ref 0.40–1.20)
Calcium: 9.4 mg/dL (ref 8.4–10.5)
Chloride: 104 mEq/L (ref 96–112)
GFR: 96.29 mL/min (ref >60.00)
GLUCOSE: 95 mg/dL (ref 70–99)
Potassium: 4.3 mEq/L (ref 3.5–5.1)
Sodium: 136 mEq/L (ref 135–145)
Total Bilirubin: 1 mg/dL (ref 0.2–1.2)
Total Protein: 7.5 g/dL (ref 6.0–8.3)

## 2014-11-08 NOTE — Progress Notes (Signed)
Pre visit review using our clinic review tool, if applicable. No additional management support is needed unless otherwise documented below in the visit note. 

## 2014-11-10 NOTE — Progress Notes (Signed)
   Subjective:    Patient ID: Stacey Huerta, female    DOB: 06-Jul-1976, 39 y.o.   MRN: [id number]  HPI Pt here to have lab done for infertility specialist.  She lost a baby last October secondary HELLP syndrome.    Past Medical History  Diagnosis Date  . Headache(784.0)   . Fibroid    History   Social History  . Marital Status: Divorced    Spouse Name: N/A    Number of Children: N/A  . Years of Education: N/A   Occupational History  . Not on file.   Social History Main Topics  . Smoking status: Never Smoker   . Smokeless tobacco: Never Used  . Alcohol Use: Yes     Comment: socially  . Drug Use: No  . Sexual Activity:    Partners: Male    Birth Control/ Protection: None   Other Topics Concern  . Not on file   Social History Narrative   Aeronautical engineer 3 days a week   Family Status  Relation Status Death Age  . Father Alive   . Mother Alive    Current Outpatient Prescriptions  Medication Sig Dispense Refill  . B Complex-C-Folic Acid (B-COMPLEX BALANCED PO) Take by mouth.    . calcium carbonate 200 MG capsule Take 250 mg by mouth 2 (two) times daily with a meal.    . Cholecalciferol (VITAMIN D) 2000 UNITS CAPS Take 2,000 Units by mouth daily.    . folic acid (FOLVITE) 1 MG tablet Take 1 mg by mouth daily.    . Omega-3 Fatty Acids (FISH OIL) 1000 MG CAPS Take by mouth.    . vitamin C (ASCORBIC ACID) 250 MG tablet Take 250 mg by mouth daily. Every other day     No current facility-administered medications for this visit.      Review of Systems  HENT: Negative.   Cardiovascular: Negative.   Gastrointestinal: Negative.   Neurological: Negative.   Psychiatric/Behavioral: Negative.        Objective:   Physical Exam BP 118/70 mmHg  Pulse 92  Temp(Src) 99.4 F (37.4 C) (Oral)  Wt 329 lb (149.233 kg)  SpO2 95% General appearance: alert, cooperative, appears stated age and no distress Neurologic: Alert and oriented X 3, normal strength and  tone. Normal symmetric reflexes. Normal coordination and gait      Assessment & Plan:  1. History of hemolysis, elevated liver enzymes, and low platelet (HELLP) syndrome Check labs for infertility specialist - Comprehensive metabolic panel

## 2014-12-14 ENCOUNTER — Ambulatory Visit (INDEPENDENT_AMBULATORY_CARE_PROVIDER_SITE_OTHER): Payer: BLUE CROSS/BLUE SHIELD | Admitting: Family Medicine

## 2014-12-14 ENCOUNTER — Encounter: Payer: Self-pay | Admitting: Family Medicine

## 2014-12-14 VITALS — BP 118/72 | HR 98 | Temp 98.6°F | Ht 68.0 in | Wt 328.8 lb

## 2014-12-14 DIAGNOSIS — Z0000 Encounter for general adult medical examination without abnormal findings: Secondary | ICD-10-CM

## 2014-12-14 DIAGNOSIS — R4589 Other symptoms and signs involving emotional state: Secondary | ICD-10-CM

## 2014-12-14 DIAGNOSIS — R8290 Unspecified abnormal findings in urine: Secondary | ICD-10-CM

## 2014-12-14 DIAGNOSIS — F603 Borderline personality disorder: Secondary | ICD-10-CM

## 2014-12-14 HISTORY — DX: Other symptoms and signs involving emotional state: R45.89

## 2014-12-14 LAB — LIPID PANEL
CHOL/HDL RATIO: 4
Cholesterol: 154 mg/dL (ref 0–200)
HDL: 37.1 mg/dL — AB (ref >39.00)
LDL CALC: 94 mg/dL (ref 0–99)
NONHDL: 116.9
Triglycerides: 117 mg/dL (ref 0.0–149.0)
VLDL: 23.4 mg/dL (ref 0.0–40.0)

## 2014-12-14 LAB — HEPATIC FUNCTION PANEL
ALBUMIN: 3.6 g/dL (ref 3.5–5.2)
ALK PHOS: 74 U/L (ref 39–117)
ALT: 26 U/L (ref 0–35)
AST: 27 U/L (ref 0–37)
Bilirubin, Direct: 0.1 mg/dL (ref 0.0–0.3)
TOTAL PROTEIN: 7.4 g/dL (ref 6.0–8.3)
Total Bilirubin: 0.9 mg/dL (ref 0.2–1.2)

## 2014-12-14 LAB — POCT URINALYSIS DIPSTICK
BILIRUBIN UA: NEGATIVE
GLUCOSE UA: NEGATIVE
Ketones, UA: NEGATIVE
Nitrite, UA: NEGATIVE
Spec Grav, UA: 1.025
Urobilinogen, UA: 0.2
pH, UA: 6

## 2014-12-14 LAB — CBC WITH DIFFERENTIAL/PLATELET
BASOS PCT: 0.4 % (ref 0.0–3.0)
Basophils Absolute: 0 10*3/uL (ref 0.0–0.1)
EOS PCT: 1.1 % (ref 0.0–5.0)
Eosinophils Absolute: 0.1 10*3/uL (ref 0.0–0.7)
HEMATOCRIT: 36.7 % (ref 36.0–46.0)
HEMOGLOBIN: 12.4 g/dL (ref 12.0–15.0)
LYMPHS ABS: 1.6 10*3/uL (ref 0.7–4.0)
LYMPHS PCT: 28.1 % (ref 12.0–46.0)
MCHC: 33.7 g/dL (ref 30.0–36.0)
MCV: 93.6 fl (ref 78.0–100.0)
MONOS PCT: 6.2 % (ref 3.0–12.0)
Monocytes Absolute: 0.4 10*3/uL (ref 0.1–1.0)
Neutro Abs: 3.7 10*3/uL (ref 1.4–7.7)
Neutrophils Relative %: 64.2 % (ref 43.0–77.0)
Platelets: 192 10*3/uL (ref 150.0–400.0)
RBC: 3.92 Mil/uL (ref 3.87–5.11)
RDW: 12.7 % (ref 11.5–15.5)
WBC: 5.8 10*3/uL (ref 4.0–10.5)

## 2014-12-14 LAB — BASIC METABOLIC PANEL
BUN: 9 mg/dL (ref 6–23)
CO2: 29 mEq/L (ref 19–32)
Calcium: 9.1 mg/dL (ref 8.4–10.5)
Chloride: 103 mEq/L (ref 96–112)
Creatinine, Ser: 0.82 mg/dL (ref 0.40–1.20)
GFR: 98.96 mL/min (ref >60.00)
Glucose, Bld: 81 mg/dL (ref 70–99)
Potassium: 3.9 mEq/L (ref 3.5–5.1)
SODIUM: 135 meq/L (ref 135–145)

## 2014-12-14 LAB — TSH: TSH: 1.34 u[IU]/mL (ref 0.35–4.50)

## 2014-12-14 NOTE — Patient Instructions (Signed)
Preventive Care for Adults A healthy lifestyle and preventive care can promote health and wellness. Preventive health guidelines for women include the following key practices.  A routine yearly physical is a good way to check with your health care provider about your health and preventive screening. It is a chance to share any concerns and updates on your health and to receive a thorough exam.  Visit your dentist for a routine exam and preventive care every 6 months. Brush your teeth twice a day and floss once a day. Good oral hygiene prevents tooth decay and gum disease.  The frequency of eye exams is based on your age, health, family medical history, use of contact lenses, and other factors. Follow your health care provider's recommendations for frequency of eye exams.  Eat a healthy diet. Foods like vegetables, fruits, whole grains, low-fat dairy products, and lean protein foods contain the nutrients you need without too many calories. Decrease your intake of foods high in solid fats, added sugars, and salt. Eat the right amount of calories for you.Get information about a proper diet from your health care provider, if necessary.  Regular physical exercise is one of the most important things you can do for your health. Most adults should get at least 150 minutes of moderate-intensity exercise (any activity that increases your heart rate and causes you to sweat) each week. In addition, most adults need muscle-strengthening exercises on 2 or more days a week.  Maintain a healthy weight. The body mass index (BMI) is a screening tool to identify possible weight problems. It provides an estimate of body fat based on height and weight. Your health care provider can find your BMI and can help you achieve or maintain a healthy weight.For adults 20 years and older:  A BMI below 18.5 is considered underweight.  A BMI of 18.5 to 24.9 is normal.  A BMI of 25 to 29.9 is considered overweight.  A BMI of  30 and above is considered obese.  Maintain normal blood lipids and cholesterol levels by exercising and minimizing your intake of saturated fat. Eat a balanced diet with plenty of fruit and vegetables. Blood tests for lipids and cholesterol should begin at age 76 and be repeated every 5 years. If your lipid or cholesterol levels are high, you are over 50, or you are at high risk for heart disease, you may need your cholesterol levels checked more frequently.Ongoing high lipid and cholesterol levels should be treated with medicines if diet and exercise are not working.  If you smoke, find out from your health care provider how to quit. If you do not use tobacco, do not start.  Lung cancer screening is recommended for adults aged 22-80 years who are at high risk for developing lung cancer because of a history of smoking. A yearly low-dose CT scan of the lungs is recommended for people who have at least a 30-pack-year history of smoking and are a current smoker or have quit within the past 15 years. A pack year of smoking is smoking an average of 1 pack of cigarettes a day for 1 year (for example: 1 pack a day for 30 years or 2 packs a day for 15 years). Yearly screening should continue until the smoker has stopped smoking for at least 15 years. Yearly screening should be stopped for people who develop a health problem that would prevent them from having lung cancer treatment.  If you are pregnant, do not drink alcohol. If you are breastfeeding,  be very cautious about drinking alcohol. If you are not pregnant and choose to drink alcohol, do not have more than 1 drink per day. One drink is considered to be 12 ounces (355 mL) of beer, 5 ounces (148 mL) of wine, or 1.5 ounces (44 mL) of liquor.  Avoid use of street drugs. Do not share needles with anyone. Ask for help if you need support or instructions about stopping the use of drugs.  High blood pressure causes heart disease and increases the risk of  stroke. Your blood pressure should be checked at least every 1 to 2 years. Ongoing high blood pressure should be treated with medicines if weight loss and exercise do not work.  If you are 3-86 years old, ask your health care provider if you should take aspirin to prevent strokes.  Diabetes screening involves taking a blood sample to check your fasting blood sugar level. This should be done once every 3 years, after age 67, if you are within normal weight and without risk factors for diabetes. Testing should be considered at a younger age or be carried out more frequently if you are overweight and have at least 1 risk factor for diabetes.  Breast cancer screening is essential preventive care for women. You should practice "breast self-awareness." This means understanding the normal appearance and feel of your breasts and may include breast self-examination. Any changes detected, no matter how small, should be reported to a health care provider. Women in their 8s and 30s should have a clinical breast exam (CBE) by a health care provider as part of a regular health exam every 1 to 3 years. After age 70, women should have a CBE every year. Starting at age 25, women should consider having a mammogram (breast X-ray test) every year. Women who have a family history of breast cancer should talk to their health care provider about genetic screening. Women at a high risk of breast cancer should talk to their health care providers about having an MRI and a mammogram every year.  Breast cancer gene (BRCA)-related cancer risk assessment is recommended for women who have family members with BRCA-related cancers. BRCA-related cancers include breast, ovarian, tubal, and peritoneal cancers. Having family members with these cancers may be associated with an increased risk for harmful changes (mutations) in the breast cancer genes BRCA1 and BRCA2. Results of the assessment will determine the need for genetic counseling and  BRCA1 and BRCA2 testing.  Routine pelvic exams to screen for cancer are no longer recommended for nonpregnant women who are considered low risk for cancer of the pelvic organs (ovaries, uterus, and vagina) and who do not have symptoms. Ask your health care provider if a screening pelvic exam is right for you.  If you have had past treatment for cervical cancer or a condition that could lead to cancer, you need Pap tests and screening for cancer for at least 20 years after your treatment. If Pap tests have been discontinued, your risk factors (such as having a new sexual partner) need to be reassessed to determine if screening should be resumed. Some women have medical problems that increase the chance of getting cervical cancer. In these cases, your health care provider may recommend more frequent screening and Pap tests.  The HPV test is an additional test that may be used for cervical cancer screening. The HPV test looks for the virus that can cause the cell changes on the cervix. The cells collected during the Pap test can be  tested for HPV. The HPV test could be used to screen women aged 30 years and older, and should be used in women of any age who have unclear Pap test results. After the age of 30, women should have HPV testing at the same frequency as a Pap test.  Colorectal cancer can be detected and often prevented. Most routine colorectal cancer screening begins at the age of 50 years and continues through age 75 years. However, your health care provider may recommend screening at an earlier age if you have risk factors for colon cancer. On a yearly basis, your health care provider may provide home test kits to check for hidden blood in the stool. Use of a small camera at the end of a tube, to directly examine the colon (sigmoidoscopy or colonoscopy), can detect the earliest forms of colorectal cancer. Talk to your health care provider about this at age 50, when routine screening begins. Direct  exam of the colon should be repeated every 5-10 years through age 75 years, unless early forms of pre-cancerous polyps or small growths are found.  People who are at an increased risk for hepatitis B should be screened for this virus. You are considered at high risk for hepatitis B if:  You were born in a country where hepatitis B occurs often. Talk with your health care provider about which countries are considered high risk.  Your parents were born in a high-risk country and you have not received a shot to protect against hepatitis B (hepatitis B vaccine).  You have HIV or AIDS.  You use needles to inject street drugs.  You live with, or have sex with, someone who has hepatitis B.  You get hemodialysis treatment.  You take certain medicines for conditions like cancer, organ transplantation, and autoimmune conditions.  Hepatitis C blood testing is recommended for all people born from 1945 through 1965 and any individual with known risks for hepatitis C.  Practice safe sex. Use condoms and avoid high-risk sexual practices to reduce the spread of sexually transmitted infections (STIs). STIs include gonorrhea, chlamydia, syphilis, trichomonas, herpes, HPV, and human immunodeficiency virus (HIV). Herpes, HIV, and HPV are viral illnesses that have no cure. They can result in disability, cancer, and death.  You should be screened for sexually transmitted illnesses (STIs) including gonorrhea and chlamydia if:  You are sexually active and are younger than 24 years.  You are older than 24 years and your health care provider tells you that you are at risk for this type of infection.  Your sexual activity has changed since you were last screened and you are at an increased risk for chlamydia or gonorrhea. Ask your health care provider if you are at risk.  If you are at risk of being infected with HIV, it is recommended that you take a prescription medicine daily to prevent HIV infection. This is  called preexposure prophylaxis (PrEP). You are considered at risk if:  You are a heterosexual woman, are sexually active, and are at increased risk for HIV infection.  You take drugs by injection.  You are sexually active with a partner who has HIV.  Talk with your health care provider about whether you are at high risk of being infected with HIV. If you choose to begin PrEP, you should first be tested for HIV. You should then be tested every 3 months for as long as you are taking PrEP.  Osteoporosis is a disease in which the bones lose minerals and strength   with aging. This can result in serious bone fractures or breaks. The risk of osteoporosis can be identified using a bone density scan. Women ages 65 years and over and women at risk for fractures or osteoporosis should discuss screening with their health care providers. Ask your health care provider whether you should take a calcium supplement or vitamin D to reduce the rate of osteoporosis.  Menopause can be associated with physical symptoms and risks. Hormone replacement therapy is available to decrease symptoms and risks. You should talk to your health care provider about whether hormone replacement therapy is right for you.  Use sunscreen. Apply sunscreen liberally and repeatedly throughout the day. You should seek shade when your shadow is shorter than you. Protect yourself by wearing long sleeves, pants, a wide-brimmed hat, and sunglasses year round, whenever you are outdoors.  Once a month, do a whole body skin exam, using a mirror to look at the skin on your back. Tell your health care provider of new moles, moles that have irregular borders, moles that are larger than a pencil eraser, or moles that have changed in shape or color.  Stay current with required vaccines (immunizations).  Influenza vaccine. All adults should be immunized every year.  Tetanus, diphtheria, and acellular pertussis (Td, Tdap) vaccine. Pregnant women should  receive 1 dose of Tdap vaccine during each pregnancy. The dose should be obtained regardless of the length of time since the last dose. Immunization is preferred during the 27th-36th week of gestation. An adult who has not previously received Tdap or who does not know her vaccine status should receive 1 dose of Tdap. This initial dose should be followed by tetanus and diphtheria toxoids (Td) booster doses every 10 years. Adults with an unknown or incomplete history of completing a 3-dose immunization series with Td-containing vaccines should begin or complete a primary immunization series including a Tdap dose. Adults should receive a Td booster every 10 years.  Varicella vaccine. An adult without evidence of immunity to varicella should receive 2 doses or a second dose if she has previously received 1 dose. Pregnant females who do not have evidence of immunity should receive the first dose after pregnancy. This first dose should be obtained before leaving the health care facility. The second dose should be obtained 4-8 weeks after the first dose.  Human papillomavirus (HPV) vaccine. Females aged 13-26 years who have not received the vaccine previously should obtain the 3-dose series. The vaccine is not recommended for use in pregnant females. However, pregnancy testing is not needed before receiving a dose. If a female is found to be pregnant after receiving a dose, no treatment is needed. In that case, the remaining doses should be delayed until after the pregnancy. Immunization is recommended for any person with an immunocompromised condition through the age of 26 years if she did not get any or all doses earlier. During the 3-dose series, the second dose should be obtained 4-8 weeks after the first dose. The third dose should be obtained 24 weeks after the first dose and 16 weeks after the second dose.  Zoster vaccine. One dose is recommended for adults aged 60 years or older unless certain conditions are  present.  Measles, mumps, and rubella (MMR) vaccine. Adults born before 1957 generally are considered immune to measles and mumps. Adults born in 1957 or later should have 1 or more doses of MMR vaccine unless there is a contraindication to the vaccine or there is laboratory evidence of immunity to   each of the three diseases. A routine second dose of MMR vaccine should be obtained at least 28 days after the first dose for students attending postsecondary schools, health care workers, or international travelers. People who received inactivated measles vaccine or an unknown type of measles vaccine during 1963-1967 should receive 2 doses of MMR vaccine. People who received inactivated mumps vaccine or an unknown type of mumps vaccine before 1979 and are at high risk for mumps infection should consider immunization with 2 doses of MMR vaccine. For females of childbearing age, rubella immunity should be determined. If there is no evidence of immunity, females who are not pregnant should be vaccinated. If there is no evidence of immunity, females who are pregnant should delay immunization until after pregnancy. Unvaccinated health care workers born before 1957 who lack laboratory evidence of measles, mumps, or rubella immunity or laboratory confirmation of disease should consider measles and mumps immunization with 2 doses of MMR vaccine or rubella immunization with 1 dose of MMR vaccine.  Pneumococcal 13-valent conjugate (PCV13) vaccine. When indicated, a person who is uncertain of her immunization history and has no record of immunization should receive the PCV13 vaccine. An adult aged 19 years or older who has certain medical conditions and has not been previously immunized should receive 1 dose of PCV13 vaccine. This PCV13 should be followed with a dose of pneumococcal polysaccharide (PPSV23) vaccine. The PPSV23 vaccine dose should be obtained at least 8 weeks after the dose of PCV13 vaccine. An adult aged 19  years or older who has certain medical conditions and previously received 1 or more doses of PPSV23 vaccine should receive 1 dose of PCV13. The PCV13 vaccine dose should be obtained 1 or more years after the last PPSV23 vaccine dose.  Pneumococcal polysaccharide (PPSV23) vaccine. When PCV13 is also indicated, PCV13 should be obtained first. All adults aged 65 years and older should be immunized. An adult younger than age 65 years who has certain medical conditions should be immunized. Any person who resides in a nursing home or long-term care facility should be immunized. An adult smoker should be immunized. People with an immunocompromised condition and certain other conditions should receive both PCV13 and PPSV23 vaccines. People with human immunodeficiency virus (HIV) infection should be immunized as soon as possible after diagnosis. Immunization during chemotherapy or radiation therapy should be avoided. Routine use of PPSV23 vaccine is not recommended for American Indians, [place] Natives, or people younger than 65 years unless there are medical conditions that require PPSV23 vaccine. When indicated, people who have unknown immunization and have no record of immunization should receive PPSV23 vaccine. One-time revaccination 5 years after the first dose of PPSV23 is recommended for people aged 19-64 years who have chronic kidney failure, nephrotic syndrome, asplenia, or immunocompromised conditions. People who received 1-2 doses of PPSV23 before age 65 years should receive another dose of PPSV23 vaccine at age 65 years or later if at least 5 years have passed since the previous dose. Doses of PPSV23 are not needed for people immunized with PPSV23 at or after age 65 years.  Meningococcal vaccine. Adults with asplenia or persistent complement component deficiencies should receive 2 doses of quadrivalent meningococcal conjugate (MenACWY-D) vaccine. The doses should be obtained at least 2 months apart.  Microbiologists working with certain meningococcal bacteria, military recruits, people at risk during an outbreak, and people who travel to or live in countries with a high rate of meningitis should be immunized. A first-year college student up through age   21 years who is living in a residence hall should receive a dose if she did not receive a dose on or after her 16th birthday. Adults who have certain high-risk conditions should receive one or more doses of vaccine.  Hepatitis A vaccine. Adults who wish to be protected from this disease, have certain high-risk conditions, work with hepatitis A-infected animals, work in hepatitis A research labs, or travel to or work in countries with a high rate of hepatitis A should be immunized. Adults who were previously unvaccinated and who anticipate close contact with an international adoptee during the first 60 days after arrival in the [place] from a country with a high rate of hepatitis A should be immunized.  Hepatitis B vaccine. Adults who wish to be protected from this disease, have certain high-risk conditions, may be exposed to blood or other infectious body fluids, are household contacts or sex partners of hepatitis B positive people, are clients or workers in certain care facilities, or travel to or work in countries with a high rate of hepatitis B should be immunized.  Haemophilus influenzae type b (Hib) vaccine. A previously unvaccinated person with asplenia or sickle cell disease or having a scheduled splenectomy should receive 1 dose of Hib vaccine. Regardless of previous immunization, a recipient of a hematopoietic stem cell transplant should receive a 3-dose series 6-12 months after her successful transplant. Hib vaccine is not recommended for adults with HIV infection. Preventive Services / Frequency Ages 64 to 68 years  Blood pressure check.** / Every 1 to 2 years.  Lipid and cholesterol check.** / Every 5 years beginning at age  22.  Clinical breast exam.** / Every 3 years for women in their 88s and 53s.  BRCA-related cancer risk assessment.** / For women who have family members with a BRCA-related cancer (breast, ovarian, tubal, or peritoneal cancers).  Pap test.** / Every 2 years from ages 90 through 51. Every 3 years starting at age 21 through age 56 or 3 with a history of 3 consecutive normal Pap tests.  HPV screening.** / Every 3 years from ages 24 through ages 1 to 46 with a history of 3 consecutive normal Pap tests.  Hepatitis C blood test.** / For any individual with known risks for hepatitis C.  Skin self-exam. / Monthly.  Influenza vaccine. / Every year.  Tetanus, diphtheria, and acellular pertussis (Tdap, Td) vaccine.** / Consult your health care provider. Pregnant women should receive 1 dose of Tdap vaccine during each pregnancy. 1 dose of Td every 10 years.  Varicella vaccine.** / Consult your health care provider. Pregnant females who do not have evidence of immunity should receive the first dose after pregnancy.  HPV vaccine. / 3 doses over 6 months, if 72 and younger. The vaccine is not recommended for use in pregnant females. However, pregnancy testing is not needed before receiving a dose.  Measles, mumps, rubella (MMR) vaccine.** / You need at least 1 dose of MMR if you were born in 1957 or later. You may also need a 2nd dose. For females of childbearing age, rubella immunity should be determined. If there is no evidence of immunity, females who are not pregnant should be vaccinated. If there is no evidence of immunity, females who are pregnant should delay immunization until after pregnancy.  Pneumococcal 13-valent conjugate (PCV13) vaccine.** / Consult your health care provider.  Pneumococcal polysaccharide (PPSV23) vaccine.** / 1 to 2 doses if you smoke cigarettes or if you have certain conditions.  Meningococcal vaccine.** /  1 dose if you are age 19 to 21 years and a first-year college  student living in a residence hall, or have one of several medical conditions, you need to get vaccinated against meningococcal disease. You may also need additional booster doses.  Hepatitis A vaccine.** / Consult your health care provider.  Hepatitis B vaccine.** / Consult your health care provider.  Haemophilus influenzae type b (Hib) vaccine.** / Consult your health care provider. Ages 40 to 64 years  Blood pressure check.** / Every 1 to 2 years.  Lipid and cholesterol check.** / Every 5 years beginning at age 20 years.  Lung cancer screening. / Every year if you are aged 55-80 years and have a 30-pack-year history of smoking and currently smoke or have quit within the past 15 years. Yearly screening is stopped once you have quit smoking for at least 15 years or develop a health problem that would prevent you from having lung cancer treatment.  Clinical breast exam.** / Every year after age 40 years.  BRCA-related cancer risk assessment.** / For women who have family members with a BRCA-related cancer (breast, ovarian, tubal, or peritoneal cancers).  Mammogram.** / Every year beginning at age 40 years and continuing for as long as you are in good health. Consult with your health care provider.  Pap test.** / Every 3 years starting at age 30 years through age 65 or 70 years with a history of 3 consecutive normal Pap tests.  HPV screening.** / Every 3 years from ages 30 years through ages 65 to 70 years with a history of 3 consecutive normal Pap tests.  Fecal occult blood test (FOBT) of stool. / Every year beginning at age 50 years and continuing until age 75 years. You may not need to do this test if you get a colonoscopy every 10 years.  Flexible sigmoidoscopy or colonoscopy.** / Every 5 years for a flexible sigmoidoscopy or every 10 years for a colonoscopy beginning at age 50 years and continuing until age 75 years.  Hepatitis C blood test.** / For all people born from 1945 through  1965 and any individual with known risks for hepatitis C.  Skin self-exam. / Monthly.  Influenza vaccine. / Every year.  Tetanus, diphtheria, and acellular pertussis (Tdap/Td) vaccine.** / Consult your health care provider. Pregnant women should receive 1 dose of Tdap vaccine during each pregnancy. 1 dose of Td every 10 years.  Varicella vaccine.** / Consult your health care provider. Pregnant females who do not have evidence of immunity should receive the first dose after pregnancy.  Zoster vaccine.** / 1 dose for adults aged 60 years or older.  Measles, mumps, rubella (MMR) vaccine.** / You need at least 1 dose of MMR if you were born in 1957 or later. You may also need a 2nd dose. For females of childbearing age, rubella immunity should be determined. If there is no evidence of immunity, females who are not pregnant should be vaccinated. If there is no evidence of immunity, females who are pregnant should delay immunization until after pregnancy.  Pneumococcal 13-valent conjugate (PCV13) vaccine.** / Consult your health care provider.  Pneumococcal polysaccharide (PPSV23) vaccine.** / 1 to 2 doses if you smoke cigarettes or if you have certain conditions.  Meningococcal vaccine.** / Consult your health care provider.  Hepatitis A vaccine.** / Consult your health care provider.  Hepatitis B vaccine.** / Consult your health care provider.  Haemophilus influenzae type b (Hib) vaccine.** / Consult your health care provider. Ages 65   years and over  Blood pressure check.** / Every 1 to 2 years.  Lipid and cholesterol check.** / Every 5 years beginning at age 22 years.  Lung cancer screening. / Every year if you are aged 73-80 years and have a 30-pack-year history of smoking and currently smoke or have quit within the past 15 years. Yearly screening is stopped once you have quit smoking for at least 15 years or develop a health problem that would prevent you from having lung cancer  treatment.  Clinical breast exam.** / Every year after age 4 years.  BRCA-related cancer risk assessment.** / For women who have family members with a BRCA-related cancer (breast, ovarian, tubal, or peritoneal cancers).  Mammogram.** / Every year beginning at age 40 years and continuing for as long as you are in good health. Consult with your health care provider.  Pap test.** / Every 3 years starting at age 9 years through age 34 or 91 years with 3 consecutive normal Pap tests. Testing can be stopped between 65 and 70 years with 3 consecutive normal Pap tests and no abnormal Pap or HPV tests in the past 10 years.  HPV screening.** / Every 3 years from ages 57 years through ages 64 or 45 years with a history of 3 consecutive normal Pap tests. Testing can be stopped between 65 and 70 years with 3 consecutive normal Pap tests and no abnormal Pap or HPV tests in the past 10 years.  Fecal occult blood test (FOBT) of stool. / Every year beginning at age 15 years and continuing until age 17 years. You may not need to do this test if you get a colonoscopy every 10 years.  Flexible sigmoidoscopy or colonoscopy.** / Every 5 years for a flexible sigmoidoscopy or every 10 years for a colonoscopy beginning at age 86 years and continuing until age 71 years.  Hepatitis C blood test.** / For all people born from [birth year] through 1967 and any individual with known risks for hepatitis C.  Osteoporosis screening.** / A one-time screening for women ages 83 years and over and women at risk for fractures or osteoporosis.  Skin self-exam. / Monthly.  Influenza vaccine. / Every year.  Tetanus, diphtheria, and acellular pertussis (Tdap/Td) vaccine.** / 1 dose of Td every 10 years.  Varicella vaccine.** / Consult your health care provider.  Zoster vaccine.** / 1 dose for adults aged 61 years or older.  Pneumococcal 13-valent conjugate (PCV13) vaccine.** / Consult your health care provider.  Pneumococcal  polysaccharide (PPSV23) vaccine.** / 1 dose for all adults aged 28 years and older.  Meningococcal vaccine.** / Consult your health care provider.  Hepatitis A vaccine.** / Consult your health care provider.  Hepatitis B vaccine.** / Consult your health care provider.  Haemophilus influenzae type b (Hib) vaccine.** / Consult your health care provider. ** Family history and personal history of risk and conditions may change your health care provider's recommendations. Document Released: 12/04/2001 Document Revised: 02/22/2014 Document Reviewed: 03/05/2011 [facility] Patient Information 2015 [place], [place]. This information is not intended to replace advice given to you by your health care provider. Make sure you discuss any questions you have with your health care provider.

## 2014-12-14 NOTE — Progress Notes (Signed)
Pre visit review using our clinic review tool, if applicable. No additional management support is needed unless otherwise documented below in the visit note. 

## 2014-12-14 NOTE — Addendum Note (Signed)
Addended by: Peggyann Shoals on: 12/14/2014 02:38 PM   Modules accepted: Orders

## 2014-12-14 NOTE — Assessment & Plan Note (Signed)
Pt is in counseling  But is refusing meds at this time

## 2014-12-14 NOTE — Progress Notes (Signed)
Subjective:     Stacey Huerta is a 39 y.o. female and is here for a comprehensive physical exam. The patient reports no problems.  History   Social History  . Marital Status: Divorced    Spouse Name: N/A  . Number of Children: N/A  . Years of Education: N/A   Occupational History  . Not on file.   Social History Main Topics  . Smoking status: Never Smoker   . Smokeless tobacco: Never Used  . Alcohol Use: Yes     Comment: socially  . Drug Use: No  . Sexual Activity:    Partners: Male    Birth Control/ Protection: None   Other Topics Concern  . Not on file   Social History Narrative   Exercise-personal trainer 3 days a week   Health Maintenance  Topic Date Due  . Samul Dada  05/06/2017  . PAP SMEAR  08/22/2017    The following portions of the patient's history were reviewed and updated as appropriate:  She  has a past medical history of Headache(784.0) and Fibroid. She  does not have any pertinent problems on file. She  has past surgical history that includes Cholecystectomy; Myomectomy; Myomectomy (05/23/2012); Lymphadenectomy; Abdominal adhesion surgery; Endometrial ablation; and Cesarean section (N/A, 03/18/2014). Her family history includes Arthritis in her paternal aunt; Breast cancer in her maternal aunt; Colon cancer in her paternal grandmother; Diabetes in her father and paternal grandfather; Hyperlipidemia in her maternal aunt and mother; Hypertension in her paternal grandfather; Hypotension in her father; Prostate cancer in her maternal grandfather. She  reports that she has never smoked. She has never used smokeless tobacco. She reports that she drinks alcohol. She reports that she does not use illicit drugs. She has a current medication list which includes the following prescription(s): b complex-c-folic acid, calcium carbonate, vitamin d, folic acid, fish oil, vitamin c, and zinc gluconate. Current Outpatient Prescriptions on File Prior to Visit   Medication Sig Dispense Refill  . B Complex-C-Folic Acid (B-COMPLEX BALANCED PO) Take by mouth.    . calcium carbonate 200 MG capsule Take 250 mg by mouth 2 (two) times daily with a meal.    . Cholecalciferol (VITAMIN D) 2000 UNITS CAPS Take 2,000 Units by mouth daily.    . folic acid (FOLVITE) 1 MG tablet Take 1 mg by mouth daily.    . Omega-3 Fatty Acids (FISH OIL) 1000 MG CAPS Take by mouth.    . vitamin C (ASCORBIC ACID) 250 MG tablet Take 250 mg by mouth daily. Every other day     No current facility-administered medications on file prior to visit.   She is allergic to eggs or egg-derived products; sulfonamide derivatives; other; penicillins; and shellfish allergy..  Review of Systems  Review of Systems  Constitutional: Negative for activity change, appetite change and fatigue.  HENT: Negative for hearing loss, congestion, tinnitus and ear discharge.  dentist q80m Eyes: Negative for visual disturbance (see optho q1y -- vision corrected to 20/20 with glasses).  Respiratory: Negative for cough, chest tightness and shortness of breath.   Cardiovascular: Negative for chest pain, palpitations and leg swelling.  Gastrointestinal: Negative for abdominal pain, diarrhea, constipation and abdominal distention.  Genitourinary: Negative for urgency, frequency, decreased urine volume and difficulty urinating.  Musculoskeletal: Negative for back pain, arthralgias and gait problem.  Skin: Negative for color change, pallor and rash.  Neurological: Negative for dizziness, light-headedness, numbness and headaches.  Hematological: Negative for adenopathy. Does not bruise/bleed easily.  Psychiatric/Behavioral: Negative for  suicidal ideas, confusion, sleep disturbance, self-injury, dysphoric mood, decreased concentration and agitation.      Objective:    BP 118/72 mmHg  Pulse 98  Temp(Src) 98.6 F (37 C) (Oral)  Ht 5\' 8"  (1.727 m)  Wt 328 lb 12.8 oz (149.143 kg)  BMI 50.01 kg/m2  SpO2 98%   LMP 12/08/2014 (LMP Unknown)  Breastfeeding? No General appearance: alert, cooperative, appears stated age, no distress and morbidly obese Head: Normocephalic, without obvious abnormality, atraumatic Eyes: conjunctivae/corneas clear. PERRL, EOM's intact. Fundi benign. Ears: normal TM's and external ear canals both ears Nose: Nares normal. Septum midline. Mucosa normal. No drainage or sinus tenderness. Throat: lips, mucosa, and tongue normal; teeth and gums normal Neck: no adenopathy, no carotid bruit, no JVD, supple, symmetrical, trachea midline and thyroid not enlarged, symmetric, no tenderness/mass/nodules Back: symmetric, no curvature. ROM normal. No CVA tenderness. Lungs: clear to auscultation bilaterally Breasts: gyn Heart: regular rate and rhythm, S1, S2 normal, no murmur, click, rub or gallop Abdomen: soft, non-tender; bowel sounds normal; no masses,  no organomegaly Pelvic: deferred--gyn Extremities: extremities normal, atraumatic, no cyanosis or edema Pulses: 2+ and symmetric Skin: Skin color, texture, turgor normal. No rashes or lesions Lymph nodes: Cervical, supraclavicular, and axillary nodes normal. Neurologic: Alert and oriented X 3, normal strength and tone. Normal symmetric reflexes. Normal coordination and gait Psych--flat affect-- pt depressed since baby passed away--not suicidal      Assessment:    Healthy female exam.      Plan:    ghm utd Check labs\ See After Visit Summary for Counseling Recommendations

## 2014-12-15 LAB — URINE CULTURE: Colony Count: 30000

## 2015-10-03 ENCOUNTER — Ambulatory Visit (INDEPENDENT_AMBULATORY_CARE_PROVIDER_SITE_OTHER): Payer: BLUE CROSS/BLUE SHIELD | Admitting: Emergency Medicine

## 2015-10-03 VITALS — BP 122/78 | HR 110 | Temp 98.5°F | Resp 18 | Ht 68.0 in | Wt 336.0 lb

## 2015-10-03 DIAGNOSIS — R309 Painful micturition, unspecified: Secondary | ICD-10-CM

## 2015-10-03 DIAGNOSIS — N3001 Acute cystitis with hematuria: Secondary | ICD-10-CM

## 2015-10-03 LAB — POCT URINALYSIS DIP (MANUAL ENTRY)
Bilirubin, UA: NEGATIVE
Glucose, UA: NEGATIVE
Ketones, POC UA: NEGATIVE
NITRITE UA: POSITIVE — AB
PH UA: 7
Spec Grav, UA: 1.025
Urobilinogen, UA: 0.2

## 2015-10-03 LAB — POC MICROSCOPIC URINALYSIS (UMFC): Mucus: ABSENT

## 2015-10-03 LAB — POCT URINE PREGNANCY: Preg Test, Ur: NEGATIVE

## 2015-10-03 MED ORDER — NITROFURANTOIN MONOHYD MACRO 100 MG PO CAPS: 100.0000 mg | ORAL_CAPSULE | Freq: Two times a day (BID) | ORAL | Status: DC

## 2015-10-03 NOTE — Progress Notes (Signed)
By signing my name below, I, Stacey Huerta, attest that this documentation has been prepared under the direction and in the presence of Arlyss Queen, MD.  Leandra Kern, Medical Scribe. 10/03/2015.  2:20 PM.  I personally performed the services described in this documentation, which was scribed in my presence. The recorded information has been reviewed and is accurate.  Chief Complaint:  Chief Complaint  Patient presents with  . Urinary Tract Infection    since friday   . Dysuria  . Flank Pain    HPI: Stacey Huerta is a 39 y.o. female with a history of recurrent UTI's in childhood, who reports to [facility] today complaining of a possible UTI, onset 3 days ago.  Pt reports that she has associated symptoms of frequency, flank pain, urgency, dysuria, bloody vaginal discharge. Pt denies menstrual irregularities, her last menstrual period was on 11/26, fevers, or chills. Pt is not currently on birth control, she is trying to get conceived since last year.    Past Medical History  Diagnosis Date  . Headache(784.0)   . Fibroid   . Allergy    Past Surgical History  Procedure Laterality Date  . Cholecystectomy    . Myomectomy    . Myomectomy  05/23/2012    Procedure: MYOMECTOMY;  Surgeon: Governor Specking, MD;  Location: [facility];  Service: Gynecology;  Laterality: N/A;  . Lymphadenectomy      Removed from the neck at age 60  . Abdominal adhesion surgery    . Endometrial ablation    . Cesarean section N/A 03/18/2014    Procedure: CESAREAN SECTION;  Surgeon: Delice Lesch, MD;  Location: [facility];  Service: Obstetrics;  Laterality: N/A;  spinal/epidural   Social History   Social History  . Marital Status: Divorced    Spouse Name: N/A  . Number of Children: N/A  . Years of Education: N/A   Occupational History  .  [place] History Main Topics  . Smoking status: Never Smoker   . Smokeless tobacco: Never Used  . Alcohol Use: 0.0 oz/week    0 Standard  drinks or equivalent per week     Comment: socially  . Drug Use: No  . Sexual Activity:    Partners: Male    Birth Control/ Protection: None   Other Topics Concern  . None   Social History Narrative   Aeronautical engineer 3 days a week   Family History  Problem Relation Age of Onset  . Hypertension Paternal Grandfather   . Diabetes Paternal Grandfather   . Diabetes Father   . Hypotension Father   . Colon cancer Paternal Grandmother   . Breast cancer Maternal Aunt   . Prostate cancer Maternal Grandfather   . Arthritis Paternal Aunt   . Hyperlipidemia Mother   . Hyperlipidemia Maternal Aunt     x's 2   Allergies  Allergen Reactions  . Eggs Or Egg-Derived Products   . Sulfonamide Derivatives Hives    Also high fever  . Other     Mushrooms and [place] Nuts  . Penicillins     Childhood reaction  . Shellfish Allergy Nausea And Vomiting   Prior to Admission medications   Medication Sig Start Date End Date Taking? Authorizing Provider  B Complex-C-Folic Acid (B-COMPLEX BALANCED PO) Take by mouth.   Yes Historical Provider, MD  calcium carbonate 200 MG capsule Take 250 mg by mouth 2 (two) times daily with a meal.   Yes  Historical Provider, MD  Cholecalciferol (VITAMIN D) 2000 UNITS CAPS Take 2,000 Units by mouth daily.   Yes Historical Provider, MD  folic acid (FOLVITE) 1 MG tablet Take 1 mg by mouth daily.   Yes Historical Provider, MD  Omega-3 Fatty Acids (FISH OIL) 1000 MG CAPS Take by mouth.   Yes Historical Provider, MD  vitamin C (ASCORBIC ACID) 250 MG tablet Take 250 mg by mouth daily. Every other day   Yes Historical Provider, MD  zinc gluconate 50 MG tablet Take 50 mg by mouth daily.   Yes Historical Provider, MD     ROS: The patient has  frequency, flank pain, urgency, dysuria, abnormal vaginal discharge Patient denies fevers, chills, menstrual problems.   All other systems have been reviewed and were otherwise negative with the exception of those mentioned  in the HPI and as above.    PHYSICAL EXAM: Filed Vitals:   10/03/15 1341  BP: 122/78  Pulse: 110  Temp: 98.5 F (36.9 C)  Resp: 18   Body mass index is 51.1 kg/(m^2).   General: Alert, no acute distress HEENT:  Normocephalic, atraumatic, oropharynx patent. Eye: [name] [facility] Cardiovascular:  Regular rate and rhythm, no rubs murmurs or gallops.  No Carotid bruits, radial pulse intact. No pedal edema.  Respiratory: Clear to auscultation bilaterally.  No wheezes, rales, or rhonchi.  No cyanosis, no use of accessory musculature Abdominal: No organomegaly, abdomen is soft and non-tender, positive bowel sounds.  No masses. No CVA tenderness.  Musculoskeletal: Gait intact. No edema, tenderness Skin: No rashes. Neurologic: Facial musculature symmetric. Psychiatric: Patient acts appropriately throughout our interaction. Lymphatic: No cervical or submandibular lymphadenopathy   LABS:  Results for orders placed or performed in visit on 10/03/15  POCT Microscopic Urinalysis ([facility])  Result Value Ref Range   WBC,UR,HPF,POC Too numerous to count  (A) None WBC/hpf   RBC,UR,HPF,POC Few (A) None RBC/hpf   Bacteria Moderate (A) None, Too numerous to count   Mucus Absent Absent   Epithelial Cells, UR Per Microscopy Few (A) None, Too numerous to count cells/hpf  POCT urinalysis dipstick  Result Value Ref Range   Color, UA yellow yellow   Clarity, UA cloudy (A) clear   Glucose, UA negative negative   Bilirubin, UA negative negative   Ketones, POC UA negative negative   Spec Grav, UA 1.025    Blood, UA moderate (A) negative   pH, UA 7.0    Protein Ur, POC =100 (A) negative   Urobilinogen, UA 0.2    Nitrite, UA Positive (A) Negative   Leukocytes, UA large (3+) (A) Negative  POCT urine pregnancy  Result Value Ref Range   Preg Test, Ur Negative Negative    EKG/XRAY:   Primary read interpreted by Dr. Everlene Farrier at [facility].   ASSESSMENT/PLAN: Patient has urinary tract infection. She is trying  to get pregnant at present time. 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 Will treat with Macrobid.I personally performed the services described in this documentation, which was scribed in my presence. The recorded information has been reviewed and is accurate.   Gross sideeffects, risk and benefits, and alternatives of medications d/w patient. Patient is aware that all medications have potential sideeffects and we are unable to predict every sideeffect or drug-drug interaction that may occur.  Arlyss Queen MD 10/03/2015 2:15 PM

## 2015-10-03 NOTE — Patient Instructions (Signed)

## 2015-10-06 LAB — URINE CULTURE: Colony Count: >=100000

## 2015-10-21 ENCOUNTER — Telehealth: Payer: Self-pay

## 2015-10-21 NOTE — Telephone Encounter (Signed)
PT called to check the status of her call back/// Informed we need (24-48 business hours for a response) if that is unacceptable, please come back to the office for re=eval   [phone]

## 2015-10-21 NOTE — Telephone Encounter (Signed)
Does pt need to RTC? 

## 2015-10-21 NOTE — Telephone Encounter (Signed)
Patient was seen on 10/03/15 for UTI. Patient states she doesn't think the antibiotic is working. She finished the antibiotic. [phone].

## 2015-10-22 ENCOUNTER — Encounter (HOSPITAL_BASED_OUTPATIENT_CLINIC_OR_DEPARTMENT_OTHER): Payer: Self-pay | Admitting: *Deleted

## 2015-10-22 ENCOUNTER — Emergency Department (HOSPITAL_BASED_OUTPATIENT_CLINIC_OR_DEPARTMENT_OTHER)
Admission: EM | Admit: 2015-10-22 | Discharge: 2015-10-22 | Disposition: A | Discharge status: Home / Self Care | Payer: BLUE CROSS/BLUE SHIELD | Attending: Emergency Medicine | Admitting: Emergency Medicine

## 2015-10-22 ENCOUNTER — Emergency Department (HOSPITAL_BASED_OUTPATIENT_CLINIC_OR_DEPARTMENT_OTHER): Payer: BLUE CROSS/BLUE SHIELD

## 2015-10-22 DIAGNOSIS — Z79899 Other long term (current) drug therapy: Secondary | ICD-10-CM | POA: Insufficient documentation

## 2015-10-22 DIAGNOSIS — Z87440 Personal history of urinary (tract) infections: Secondary | ICD-10-CM | POA: Diagnosis not present

## 2015-10-22 DIAGNOSIS — R109 Unspecified abdominal pain: Secondary | ICD-10-CM | POA: Diagnosis present

## 2015-10-22 DIAGNOSIS — Z9049 Acquired absence of other specified parts of digestive tract: Secondary | ICD-10-CM | POA: Insufficient documentation

## 2015-10-22 DIAGNOSIS — Z86010 Personal history of colonic polyps: Secondary | ICD-10-CM | POA: Insufficient documentation

## 2015-10-22 DIAGNOSIS — R000 Tachycardia, unspecified: Secondary | ICD-10-CM | POA: Insufficient documentation

## 2015-10-22 DIAGNOSIS — N12 Tubulo-interstitial nephritis, not specified as acute or chronic: Secondary | ICD-10-CM | POA: Diagnosis not present

## 2015-10-22 DIAGNOSIS — Z880 Allergy status to penicillin: Secondary | ICD-10-CM | POA: Insufficient documentation

## 2015-10-22 DIAGNOSIS — Z792 Long term (current) use of antibiotics: Secondary | ICD-10-CM | POA: Diagnosis not present

## 2015-10-22 DIAGNOSIS — Z3202 Encounter for pregnancy test, result negative: Secondary | ICD-10-CM | POA: Insufficient documentation

## 2015-10-22 LAB — URINALYSIS, ROUTINE W REFLEX MICROSCOPIC
Bilirubin Urine: NEGATIVE
GLUCOSE, UA: NEGATIVE mg/dL
HGB URINE DIPSTICK: NEGATIVE
KETONES UR: NEGATIVE mg/dL
Nitrite: NEGATIVE
PROTEIN: NEGATIVE mg/dL
Specific Gravity, Urine: 1.016 (ref 1.005–1.030)
pH: 7 (ref 5.0–8.0)

## 2015-10-22 LAB — URINE MICROSCOPIC-ADD ON: RBC / HPF: NONE SEEN RBC/hpf (ref 0–5)

## 2015-10-22 LAB — PREGNANCY, URINE: PREG TEST UR: NEGATIVE

## 2015-10-22 MED ORDER — CEFTRIAXONE SODIUM 1 G IJ SOLR
INTRAMUSCULAR | Status: AC
  Filled 2015-10-22: qty 10

## 2015-10-22 MED ORDER — ONDANSETRON 8 MG PO TBDP: 8.0000 mg | ORAL_TABLET | Freq: Three times a day (TID) | ORAL | Status: DC | PRN

## 2015-10-22 MED ORDER — KETOROLAC TROMETHAMINE 30 MG/ML IJ SOLN
30.0000 mg | Freq: Once | INTRAMUSCULAR | Status: AC
  Administered 2015-10-22: 30 mg via INTRAVENOUS
  Filled 2015-10-22: qty 1

## 2015-10-22 MED ORDER — ONDANSETRON HCL 4 MG/2ML IJ SOLN
4.0000 mg | Freq: Once | INTRAMUSCULAR | Status: AC
  Administered 2015-10-22: 4 mg via INTRAVENOUS
  Filled 2015-10-22: qty 2

## 2015-10-22 MED ORDER — HYDROCODONE-ACETAMINOPHEN 5-325 MG PO TABS: 1.0000 | ORAL_TABLET | ORAL | Status: DC | PRN

## 2015-10-22 MED ORDER — IOHEXOL 300 MG/ML  SOLN
100.0000 mL | Freq: Once | INTRAMUSCULAR | Status: AC | PRN
  Administered 2015-10-22: 100 mL via INTRAVENOUS

## 2015-10-22 MED ORDER — DEXTROSE 5 % IV SOLN
1.0000 g | Freq: Once | INTRAVENOUS | Status: AC
  Administered 2015-10-22: 1 g via INTRAVENOUS

## 2015-10-22 MED ORDER — ACETAMINOPHEN 500 MG PO TABS
1000.0000 mg | ORAL_TABLET | Freq: Once | ORAL | Status: AC
  Administered 2015-10-22: 1000 mg via ORAL
  Filled 2015-10-22: qty 2

## 2015-10-22 MED ORDER — SODIUM CHLORIDE 0.9 % IV BOLUS (SEPSIS)
1000.0000 mL | Freq: Once | INTRAVENOUS | Status: AC
  Administered 2015-10-22: 1000 mL via INTRAVENOUS

## 2015-10-22 MED ORDER — OXYCODONE-ACETAMINOPHEN 5-325 MG PO TABS
1.0000 | ORAL_TABLET | Freq: Once | ORAL | Status: AC
  Administered 2015-10-22: 1 via ORAL
  Filled 2015-10-22: qty 1

## 2015-10-22 MED ORDER — MORPHINE SULFATE (PF) 4 MG/ML IV SOLN
4.0000 mg | Freq: Once | INTRAVENOUS | Status: AC
  Administered 2015-10-22: 4 mg via INTRAVENOUS
  Filled 2015-10-22: qty 1

## 2015-10-22 NOTE — ED Notes (Signed)
Right flank pain that started today.  Pt on antibiotics for UTI.

## 2015-10-22 NOTE — Discharge Instructions (Signed)

## 2015-10-22 NOTE — ED Provider Notes (Signed)
CSN: [id number]     Arrival date & time 10/22/15  1506 History  By signing my name below, I, Stacey Huerta, attest that this documentation has been prepared under the direction and in the presence of Stacey Schmidt, MD. Electronically Signed: Helane Huerta, ED Scribe. 10/22/2015. 4:59 PM.    Chief Complaint  Patient presents with  . Flank Pain   The history is provided by the patient. No language interpreter was used.   HPI Comments: Stacey Huerta is a 39 y.o. female who presents to the Emergency Department complaining of constant, worsening, aching, right flank pain onset earlier today. Pt states she was seen for a suspected UTI a few weeks ago at Urgent Care and was prescribed antibiotics for this. She notes she took the 7 day course, which improved the symptoms, but never resolved them. She states she went back to Urgent Care and was prescribed Cipro this morning. She then took the medication 9:30 AM and began having chills at 11 AM. She notes she has also taken some aspirin without relief. She reports associated chills and nausea. She denies a PMHx of kidney stones, but notes a history of gallstones. Pt denies vomiting.   Past Medical History  Diagnosis Date  . Headache(784.0)   . Fibroid   . Allergy    Past Surgical History  Procedure Laterality Date  . Cholecystectomy    . Myomectomy    . Myomectomy  05/23/2012    Procedure: MYOMECTOMY;  Surgeon: Governor Specking, MD;  Location: [facility];  Service: Gynecology;  Laterality: N/A;  . Lymphadenectomy      Removed from the neck at age 66  . Abdominal adhesion surgery    . Endometrial ablation    . Cesarean section N/A 03/18/2014    Procedure: CESAREAN SECTION;  Surgeon: Delice Lesch, MD;  Location: [facility];  Service: Obstetrics;  Laterality: N/A;  spinal/epidural   Family History  Problem Relation Age of Onset  . Hypertension Paternal Grandfather   . Diabetes Paternal Grandfather   . Diabetes Father   . Hypotension Father    . Colon cancer Paternal Grandmother   . Breast cancer Maternal Aunt   . Prostate cancer Maternal Grandfather   . Arthritis Paternal Aunt   . Hyperlipidemia Mother   . Hyperlipidemia Maternal Aunt     x's 2   Social History  Substance Use Topics  . Smoking status: Never Smoker   . Smokeless tobacco: Never Used  . Alcohol Use: 0.0 oz/week    0 Standard drinks or equivalent per week     Comment: socially   OB History    Gravida Para Term Preterm AB TAB SAB Ectopic Multiple Living   3 1  1 2  2   1      Review of Systems A complete 10 system review of systems was obtained and all systems are negative except as noted in the HPI and PMH.   Allergies  Eggs or egg-derived products; Sulfonamide derivatives; Other; Penicillins; and Shellfish allergy  Home Medications   Prior to Admission medications   Medication Sig Start Date End Date Taking? Authorizing Provider  B Complex-C-Folic Acid (B-COMPLEX BALANCED PO) Take by mouth.    Historical Provider, MD  calcium carbonate 200 MG capsule Take 250 mg by mouth 2 (two) times daily with a meal.    Historical Provider, MD  Cholecalciferol (VITAMIN D) 2000 UNITS CAPS Take 2,000 Units by mouth daily.    Historical Provider, MD  folic acid (  FOLVITE) 1 MG tablet Take 1 mg by mouth daily.    Historical Provider, MD  nitrofurantoin, macrocrystal-monohydrate, (MACROBID) 100 MG capsule Take 1 capsule (100 mg total) by mouth 2 (two) times daily. 10/03/15   Darlyne Russian, MD  Omega-3 Fatty Acids (FISH OIL) 1000 MG CAPS Take by mouth.    Historical Provider, MD  vitamin C (ASCORBIC ACID) 250 MG tablet Take 250 mg by mouth daily. Every other day    Historical Provider, MD  zinc gluconate 50 MG tablet Take 50 mg by mouth daily.    Historical Provider, MD   BP 115/64 mmHg  Pulse 108  Temp(Src) 99.6 F (37.6 C) (Oral)  Resp 98  Ht 5\' 10"  (1.778 m)  Wt 320 lb (145.151 kg)  BMI 45.92 kg/m2  SpO2 100%  LMP 10/12/2015 Physical Exam  Constitutional:  She is oriented to person, place, and time. She appears well-developed and well-nourished. No distress.  HENT:  Head: Normocephalic and atraumatic.  Eyes: EOM are normal.  Neck: Normal range of motion.  Cardiovascular: Normal rate, regular rhythm and normal heart sounds.   Tachycardia   Pulmonary/Chest: Effort normal and breath sounds normal.  Abdominal: Soft. She exhibits no distension. There is tenderness.  Mild right-sided TTP  Genitourinary:  Right CVA TTP  Musculoskeletal: Normal range of motion.  Neurological: She is alert and oriented to person, place, and time.  Skin: Skin is warm and dry.  Psychiatric: She has a normal mood and affect. Judgment normal.  Nursing note and vitals reviewed.   ED Course  Procedures  DIAGNOSTIC STUDIES: Oxygen Saturation is 100% on RA, normal by my interpretation.    COORDINATION OF CARE: 4:58 PM - Discussed probable kidney infection. Discussed plans to order a CT scan to r/o a renal abscess. Pt advised of plan for treatment and pt agrees.  Labs Review Labs Reviewed  URINALYSIS, ROUTINE W REFLEX MICROSCOPIC (NOT AT [facility]) - Abnormal; Notable for the following:    Leukocytes, UA SMALL (*)    All other components within normal limits  URINE MICROSCOPIC-ADD ON - Abnormal; Notable for the following:    Squamous Epithelial / LPF 0-5 (*)    Bacteria, UA MANY (*)    All other components within normal limits  PREGNANCY, URINE    Imaging Review Ct Abdomen Pelvis W Contrast  10/22/2015  CLINICAL DATA:  Patient with history of recurrent urinary tract infections. Right-sided flank tenderness. Evaluate for renal abscess. EXAM: CT ABDOMEN AND PELVIS WITH CONTRAST TECHNIQUE: Multidetector CT imaging of the abdomen and pelvis was performed using the standard protocol following bolus administration of intravenous contrast. CONTRAST:  148mL OMNIPAQUE IOHEXOL 300 MG/ML  SOLN COMPARISON:  CT abdomen pelvis 06/06/2015. FINDINGS: Lower chest: Normal heart size.  Dependent atelectasis within the bilateral lower lobes. No pleural effusion. 6 mm well-circumscribed nodule anterior right lung base (image 8; series 3). Hepatobiliary: Liver is normal in size and contour. Patient status post cholecystectomy. No intrahepatic or extrahepatic biliary ductal dilatation. Pancreas: Unremarkable Spleen: Unremarkable Adrenals/Urinary Tract: The adrenal glands are normal. There is patchy hypoenhancement involving the inferior pole of the right kidney (image 47; series 5). Left kidney anterior is normally. No nephrolithiasis. No hydronephrosis. Urinary bladder is unremarkable. Stomach/Bowel: Sigmoid colonic diverticulosis. No CT evidence for acute diverticulitis. Normal appendix. No abnormal bowel wall thickening or evidence for bowel obstruction. No free fluid or free intraperitoneal air. Vascular/Lymphatic: Normal caliber abdominal aorta. Bilateral external iliac adenopathy is stable with the largest node measuring 15 mm (image  73; series 2). Other: Uterus is unremarkable.  Adnexal structures are unremarkable. Musculoskeletal: Lumbar spine degenerative changes. No aggressive or acute appearing osseous lesions. IMPRESSION: Patchy hypoenhancement involving the inferior pole of the right kidney raising the possibility of pyelonephritis in the appropriate clinical setting. Sigmoid colonic diverticulosis without CT evidence for acute diverticulitis. Stable bilateral pelvic sidewall and external iliac lymphadenopathy, likely benign given stability over time. Re- demonstrated 6 mm nodule within the anterior right lung base. If the patient is at high risk for bronchogenic carcinoma, follow-up chest CT at 6-12 months is recommended. If the patient is at low risk for bronchogenic carcinoma, follow-up chest CT at 12 months is recommended. This recommendation follows the consensus statement: Guidelines for Management of Small Pulmonary Nodules Detected on CT Scans: A Statement from the [place] as published in Radiology 2005;237:395-400. Electronically Signed   By: Lovey Newcomer M.D.   On: 10/22/2015 18:07   I have personally reviewed and evaluated these images and lab results as part of my medical decision-making.   EKG Interpretation None      MDM   Final diagnoses:  Pyelonephritis    7:33 PM Patient feels better at this time.  Patient is being treated clinically as pyelonephritis.  No obstructing stone on the right side.  No renal abscess.  Patient feels better.  Vital signs are normalized.  Fever control at home.  She has ciprofloxacin at home which we she was prescribed today.  She will continue this.  Urine was sent for urine culture.  She'll be sent home with Zofran and hydrocodone.  She understands to return to the emergency department for new or worsening symptoms.  Primary care follow-up in the next 2 or 3 days.  Overall well-appearing.  Nontoxic.  I personally performed the services described in this documentation, which was scribed in my presence. The recorded information has been reviewed and is accurate.       Stacey Schmidt, MD 10/22/15 [phone]

## 2015-10-22 NOTE — ED Notes (Signed)
2nd IVF of NS completed

## 2015-10-22 NOTE — ED Notes (Signed)
MD at bedside. 

## 2015-10-22 NOTE — ED Notes (Signed)
States went to Urgent Care 2 weeks ago and rec Macrobid for an UTI, had follow up appt and rec Cipro due to current infection in urine, Cipro began today, at approx 0900hrs, began having chills approx 1100hrs today. Having urgency and pressure.

## 2015-10-22 NOTE — ED Notes (Signed)
Has lower back, rt side having pain

## 2015-10-22 NOTE — ED Notes (Signed)
Patient calling friend to drive her home secondary to rec pain med IV and PO

## 2015-10-22 NOTE — Telephone Encounter (Signed)
I called and spoke with patient. I advised her her infection was sensitive to Macrobid.. I advised her to repeat a urine after treatment with Cipro which she received at another clinic. I told her she could have a stone.

## 2015-10-22 NOTE — ED Notes (Signed)
During EDP evaluation, has rt flank tenderness

## 2015-10-24 LAB — URINE CULTURE

## 2015-10-28 ENCOUNTER — Encounter: Payer: Self-pay | Admitting: Family Medicine

## 2015-10-28 ENCOUNTER — Ambulatory Visit (INDEPENDENT_AMBULATORY_CARE_PROVIDER_SITE_OTHER): Payer: BLUE CROSS/BLUE SHIELD | Admitting: Family Medicine

## 2015-10-28 VITALS — BP 121/71 | HR 99 | Temp 98.5°F | Wt 340.8 lb

## 2015-10-28 DIAGNOSIS — M3500 Sicca syndrome, unspecified: Secondary | ICD-10-CM

## 2015-10-28 DIAGNOSIS — N390 Urinary tract infection, site not specified: Secondary | ICD-10-CM | POA: Diagnosis not present

## 2015-10-28 DIAGNOSIS — N12 Tubulo-interstitial nephritis, not specified as acute or chronic: Secondary | ICD-10-CM | POA: Diagnosis not present

## 2015-10-28 HISTORY — DX: Sjogren syndrome, unspecified: M35.00

## 2015-10-28 LAB — POCT URINALYSIS DIPSTICK
BILIRUBIN UA: NEGATIVE
Blood, UA: NEGATIVE
GLUCOSE UA: NEGATIVE
KETONES UA: NEGATIVE
LEUKOCYTES UA: NEGATIVE
NITRITE UA: NEGATIVE
Protein, UA: NEGATIVE
Spec Grav, UA: 1.025
Urobilinogen, UA: 0.2
pH, UA: 6

## 2015-10-28 MED ORDER — ASPIRIN EC 81 MG PO TBEC: 81.0000 mg | DELAYED_RELEASE_TABLET | Freq: Every day | ORAL | Status: AC

## 2015-10-28 NOTE — Patient Instructions (Signed)

## 2015-10-28 NOTE — Progress Notes (Signed)
Patient ID: Stacey Huerta, female    DOB: 03-29-1976  Age: 40 y.o. MRN: [id number]    Subjective:  Subjective HPI Stacey Huerta presents for f/u UC and ED for pyelonephriits.  She has been in mult off for R flank pain and tx with macrobid and cipro for uti-- pyelonephritis dx on 12/31.  She went back to uc yesterday because she felt no better.  The infection was improved per pt but not gone-- they gave her more cipro.  She is better but still has R flank pain.  No fevers.    Review of Systems  Constitutional: Negative for fever, chills, activity change and appetite change.  Gastrointestinal: Negative for abdominal pain and abdominal distention.  Genitourinary: Positive for dysuria, urgency, frequency and flank pain. Negative for hematuria, vaginal discharge, difficulty urinating, genital sores, vaginal pain, menstrual problem, pelvic pain and dyspareunia.  Musculoskeletal: Negative for back pain.    History Past Medical History  Diagnosis Date  . Headache(784.0)   . Fibroid   . Allergy   . Sjogren's syndrome ([place]) 10/28/2015    She has past surgical history that includes Cholecystectomy; Myomectomy; Myomectomy (05/23/2012); Lymphadenectomy; Abdominal adhesion surgery; Endometrial ablation; and Cesarean section (N/A, 03/18/2014).   Her family history includes Arthritis in her paternal aunt; Breast cancer in her maternal aunt; Colon cancer in her paternal grandmother; Diabetes in her father and paternal grandfather; Hyperlipidemia in her maternal aunt and mother; Hypertension in her paternal grandfather; Hypotension in her father; Prostate cancer in her maternal grandfather.She reports that she has never smoked. She has never used smokeless tobacco. She reports that she drinks alcohol. She reports that she does not use illicit drugs.  Current Outpatient Prescriptions on File Prior to Visit  Medication Sig Dispense Refill  . B Complex-C-Folic Acid (B-COMPLEX BALANCED PO) Take by  mouth.    . calcium carbonate 200 MG capsule Take 250 mg by mouth 2 (two) times daily with a meal.    . Cholecalciferol (VITAMIN D) 2000 UNITS CAPS Take 2,000 Units by mouth daily.    . folic acid (FOLVITE) 1 MG tablet Take 1 mg by mouth daily.    Marland Kitchen HYDROcodone-acetaminophen (NORCO/VICODIN) 5-325 MG tablet Take 1 tablet by mouth every 4 (four) hours as needed for moderate pain. 15 tablet 0  . Omega-3 Fatty Acids (FISH OIL) 1000 MG CAPS Take by mouth.    . ondansetron (ZOFRAN ODT) 8 MG disintegrating tablet Take 1 tablet (8 mg total) by mouth every 8 (eight) hours as needed for nausea or vomiting. 10 tablet 0  . vitamin C (ASCORBIC ACID) 250 MG tablet Take 250 mg by mouth daily. Every other day    . zinc gluconate 50 MG tablet Take 50 mg by mouth daily.     No current facility-administered medications on file prior to visit.     Objective:  Objective Physical Exam  Constitutional: She is oriented to person, place, and time. She appears well-developed and well-nourished.  HENT:  Head: Normocephalic and atraumatic.  Eyes: Conjunctivae and EOM are normal.  Neck: Normal range of motion. Neck supple. No JVD present. Carotid bruit is not present. No thyromegaly present.  Cardiovascular: Normal rate, regular rhythm and normal heart sounds.   No murmur heard. Pulmonary/Chest: Effort normal and breath sounds normal. No respiratory distress. She has no wheezes. She has no rales. She exhibits no tenderness.  Abdominal: Soft. Bowel sounds are normal. There is no tenderness.  Musculoskeletal: She exhibits no edema.  Neurological: She is  alert and oriented to person, place, and time.  Psychiatric: She has a normal mood and affect.  Nursing note and vitals reviewed.  BP 121/71 mmHg  Pulse 99  Temp(Src) 98.5 F (36.9 C) (Oral)  Wt 340 lb 12.8 oz (154.586 kg)  SpO2 99%  LMP 10/12/2015 Wt Readings from Last 3 Encounters:  10/28/15 340 lb 12.8 oz (154.586 kg)  10/22/15 320 lb (145.151 kg)    10/03/15 336 lb (152.409 kg)     Lab Results  Component Value Date   WBC 5.8 12/14/2014   HGB 12.4 12/14/2014   HCT 36.7 12/14/2014   PLT 192.0 12/14/2014   GLUCOSE 81 12/14/2014   CHOL 154 12/14/2014   TRIG 117.0 12/14/2014   HDL 37.10* 12/14/2014   LDLCALC 94 12/14/2014   ALT 26 12/14/2014   AST 27 12/14/2014   NA 135 12/14/2014   K 3.9 12/14/2014   CL 103 12/14/2014   CREATININE 0.82 12/14/2014   BUN 9 12/14/2014   CO2 29 12/14/2014   TSH 1.34 12/14/2014   HGBA1C 5.2 05/31/2008    Ct Abdomen Pelvis W Contrast  10/22/2015  CLINICAL DATA:  Patient with history of recurrent urinary tract infections. Right-sided flank tenderness. Evaluate for renal abscess. EXAM: CT ABDOMEN AND PELVIS WITH CONTRAST TECHNIQUE: Multidetector CT imaging of the abdomen and pelvis was performed using the standard protocol following bolus administration of intravenous contrast. CONTRAST:  167mL OMNIPAQUE IOHEXOL 300 MG/ML  SOLN COMPARISON:  CT abdomen pelvis 06/06/2015. FINDINGS: Lower chest: Normal heart size. Dependent atelectasis within the bilateral lower lobes. No pleural effusion. 6 mm well-circumscribed nodule anterior right lung base (image 8; series 3). Hepatobiliary: Liver is normal in size and contour. Patient status post cholecystectomy. No intrahepatic or extrahepatic biliary ductal dilatation. Pancreas: Unremarkable Spleen: Unremarkable Adrenals/Urinary Tract: The adrenal glands are normal. There is patchy hypoenhancement involving the inferior pole of the right kidney (image 47; series 5). Left kidney anterior is normally. No nephrolithiasis. No hydronephrosis. Urinary bladder is unremarkable. Stomach/Bowel: Sigmoid colonic diverticulosis. No CT evidence for acute diverticulitis. Normal appendix. No abnormal bowel wall thickening or evidence for bowel obstruction. No free fluid or free intraperitoneal air. Vascular/Lymphatic: Normal caliber abdominal aorta. Bilateral external iliac adenopathy  is stable with the largest node measuring 15 mm (image 73; series 2). Other: Uterus is unremarkable.  Adnexal structures are unremarkable. Musculoskeletal: Lumbar spine degenerative changes. No aggressive or acute appearing osseous lesions. IMPRESSION: Patchy hypoenhancement involving the inferior pole of the right kidney raising the possibility of pyelonephritis in the appropriate clinical setting. Sigmoid colonic diverticulosis without CT evidence for acute diverticulitis. Stable bilateral pelvic sidewall and external iliac lymphadenopathy, likely benign given stability over time. Re- demonstrated 6 mm nodule within the anterior right lung base. If the patient is at high risk for bronchogenic carcinoma, follow-up chest CT at 6-12 months is recommended. If the patient is at low risk for bronchogenic carcinoma, follow-up chest CT at 12 months is recommended. This recommendation follows the consensus statement: Guidelines for Management of Small Pulmonary Nodules Detected on CT Scans: A Statement from the [place] as published in Radiology 2005;237:395-400. Electronically Signed   By: Lovey Newcomer M.D.   On: 10/22/2015 18:07     Assessment & Plan:  Plan I have discontinued Ms. Althaus's nitrofurantoin (macrocrystal-monohydrate). I am also having her start on aspirin EC. Additionally, I am having her maintain her Vitamin D, vitamin C, B Complex-C-Folic Acid (B-COMPLEX BALANCED PO), folic acid, calcium carbonate, Fish Oil, zinc gluconate, ondansetron,  HYDROcodone-acetaminophen, and ciprofloxacin.  Meds ordered this encounter  Medications  . ciprofloxacin (CIPRO) 500 MG tablet    Sig: Take 500 mg by mouth 2 (two) times daily.    Refill:  0  . aspirin EC 81 MG tablet    Sig: Take 1 tablet (81 mg total) by mouth daily.    Problem List Items Addressed This Visit    Sjogren's syndrome ([place]) - Primary   Relevant Medications   aspirin EC 81 MG tablet    Other Visit Diagnoses    Recurrent UTI         Relevant Orders    Ambulatory referral to Urology    POCT urinalysis dipstick (Completed)    Urine culture (Completed)    Pyelonephritis        Relevant Orders    POCT urinalysis dipstick (Completed)    Urine culture (Completed)       Follow-up: Return if symptoms worsen or fail to improve.  Garnet Koyanagi, DO

## 2015-10-28 NOTE — Progress Notes (Signed)
Pre visit review using our clinic review tool, if applicable. No additional management support is needed unless otherwise documented below in the visit note. 

## 2015-10-29 LAB — URINE CULTURE: Colony Count: 40000

## 2015-12-19 ENCOUNTER — Ambulatory Visit (INDEPENDENT_AMBULATORY_CARE_PROVIDER_SITE_OTHER): Payer: BLUE CROSS/BLUE SHIELD | Admitting: Family Medicine

## 2015-12-19 ENCOUNTER — Encounter: Payer: Self-pay | Admitting: Family Medicine

## 2015-12-19 VITALS — BP 124/82 | HR 100 | Temp 98.9°F | Ht 70.0 in | Wt 341.6 lb

## 2015-12-19 DIAGNOSIS — H269 Unspecified cataract: Secondary | ICD-10-CM | POA: Diagnosis not present

## 2015-12-19 DIAGNOSIS — R739 Hyperglycemia, unspecified: Secondary | ICD-10-CM | POA: Diagnosis not present

## 2015-12-19 HISTORY — DX: Hyperglycemia, unspecified: R73.9

## 2015-12-19 LAB — COMPREHENSIVE METABOLIC PANEL
ALBUMIN: 3.6 g/dL (ref 3.5–5.2)
ALK PHOS: 78 U/L (ref 39–117)
ALT: 15 U/L (ref 0–35)
AST: 18 U/L (ref 0–37)
BILIRUBIN TOTAL: 0.7 mg/dL (ref 0.2–1.2)
BUN: 8 mg/dL (ref 6–23)
CALCIUM: 9.2 mg/dL (ref 8.4–10.5)
CO2: 28 mEq/L (ref 19–32)
Chloride: 103 mEq/L (ref 96–112)
Creatinine, Ser: 0.83 mg/dL (ref 0.40–1.20)
GFR: 97.1 mL/min (ref >60.00)
Glucose, Bld: 93 mg/dL (ref 70–99)
Potassium: 3.7 mEq/L (ref 3.5–5.1)
Sodium: 136 mEq/L (ref 135–145)
TOTAL PROTEIN: 7.4 g/dL (ref 6.0–8.3)

## 2015-12-19 LAB — LIPID PANEL
CHOLESTEROL: 144 mg/dL (ref 0–200)
HDL: 34.4 mg/dL — ABNORMAL LOW (ref >39.00)
LDL CALC: 90 mg/dL (ref 0–99)
NonHDL: 109.85
TRIGLYCERIDES: 101 mg/dL (ref 0.0–149.0)
Total CHOL/HDL Ratio: 4
VLDL: 20.2 mg/dL (ref 0.0–40.0)

## 2015-12-19 LAB — MICROALBUMIN / CREATININE URINE RATIO
CREATININE, U: 345.3 mg/dL
Microalb Creat Ratio: 1.4 mg/g (ref 0.0–30.0)
Microalb, Ur: 5 mg/dL — ABNORMAL HIGH (ref 0.0–1.9)

## 2015-12-19 LAB — HEMOGLOBIN A1C: Hgb A1c MFr Bld: 5.2 % (ref 4.6–6.5)

## 2015-12-19 NOTE — Progress Notes (Signed)
Pre visit review using our clinic review tool, if applicable. No additional management support is needed unless otherwise documented below in the visit note. 

## 2015-12-19 NOTE — Patient Instructions (Signed)

## 2015-12-19 NOTE — Progress Notes (Deleted)
   Subjective:    Patient ID: Stacey Huerta, female    DOB: [birth year], 40 y.o.   MRN: [id number]  HPI    Review of Systems     Objective:   Physical Exam        Assessment & Plan:

## 2015-12-19 NOTE — Progress Notes (Signed)
Patient ID: Stacey Huerta, female    DOB: [birth year]  Age: 40 y.o. MRN: [id number]    Subjective:  Subjective HPI Stacey Huerta presents for f/u from eye Dr--- Dr Peggye Form.  He told pt she needed to be checked for diabetes because she had the start of a cataract.  Pt has no other complaints.    Review of Systems  Constitutional: Negative for diaphoresis, appetite change, fatigue and unexpected weight change.  Eyes: Negative for pain, redness and visual disturbance.  Respiratory: Negative for cough, chest tightness, shortness of breath and wheezing.   Cardiovascular: Negative for chest pain, palpitations and leg swelling.  Endocrine: Negative for cold intolerance, heat intolerance, polydipsia, polyphagia and polyuria.  Genitourinary: Negative for dysuria, frequency and difficulty urinating.  Neurological: Negative for dizziness, light-headedness, numbness and headaches.    History Past Medical History  Diagnosis Date  . Headache(784.0)   . Fibroid   . Allergy   . Sjogren's syndrome ([place]) 10/28/2015    She has past surgical history that includes Cholecystectomy; Myomectomy; Myomectomy (05/23/2012); Lymphadenectomy; Abdominal adhesion surgery; Endometrial ablation; and Cesarean section (N/A, 03/18/2014).   Her family history includes Arthritis in her paternal aunt; Breast cancer in her maternal aunt; Colon cancer in her paternal grandmother; Diabetes in her father, paternal grandfather, and paternal grandmother; Hyperlipidemia in her maternal aunt and mother; Hypertension in her paternal grandfather; Hypotension in her father; Prostate cancer in her maternal grandfather.She reports that she has never smoked. She has never used smokeless tobacco. She reports that she drinks alcohol. She reports that she does not use illicit drugs.  Current Outpatient Prescriptions on File Prior to Visit  Medication Sig Dispense Refill  . aspirin EC 81 MG tablet Take 1 tablet (81 mg total) by mouth  daily.    . B Complex-C-Folic Acid (B-COMPLEX BALANCED PO) Take by mouth.    . calcium carbonate 200 MG capsule Take 250 mg by mouth 2 (two) times daily with a meal.    . Cholecalciferol (VITAMIN D) 2000 UNITS CAPS Take 2,000 Units by mouth daily.    . folic acid (FOLVITE) 1 MG tablet Take 1 mg by mouth daily.    . Omega-3 Fatty Acids (FISH OIL) 1000 MG CAPS Take by mouth.    . vitamin C (ASCORBIC ACID) 250 MG tablet Take 250 mg by mouth daily. Every other day    . zinc gluconate 50 MG tablet Take 50 mg by mouth daily.     No current facility-administered medications on file prior to visit.     Objective:  Objective Physical Exam  Constitutional: She is oriented to person, place, and time. She appears well-developed and well-nourished.  HENT:  Head: Normocephalic and atraumatic.  Eyes: Conjunctivae and EOM are normal.  Neck: Normal range of motion. Neck supple. No JVD present. Carotid bruit is not present. No thyromegaly present.  Cardiovascular: Normal rate, regular rhythm and normal heart sounds.   No murmur heard. Pulmonary/Chest: Effort normal and breath sounds normal. No respiratory distress. She has no wheezes. She has no rales. She exhibits no tenderness.  Musculoskeletal: She exhibits no edema.  Neurological: She is alert and oriented to person, place, and time.  Psychiatric: She has a normal mood and affect.  Nursing note and vitals reviewed.  BP 124/82 mmHg  Pulse 100  Temp(Src) 98.9 F (37.2 C) (Oral)  Ht _0  (1.778 m)  Wt 341 lb 9.6 oz (154.949 kg)  BMI 49.01 kg/m2  SpO2 99%  LMP 12/03/2015 (  Exact Date) Wt Readings from Last 3 Encounters:  12/19/15 341 lb 9.6 oz (154.949 kg)  10/28/15 340 lb 12.8 oz (154.586 kg)  10/22/15 320 lb (145.151 kg)     Lab Results  Component Value Date   WBC 5.8 12/14/2014   HGB 12.4 12/14/2014   HCT 36.7 12/14/2014   PLT 192.0 12/14/2014   GLUCOSE 81 12/14/2014   CHOL 154 12/14/2014   TRIG 117.0 12/14/2014   HDL 37.10*  12/14/2014   LDLCALC 94 12/14/2014   ALT 26 12/14/2014   AST 27 12/14/2014   NA 135 12/14/2014   K 3.9 12/14/2014   CL 103 12/14/2014   CREATININE 0.82 12/14/2014   BUN 9 12/14/2014   CO2 29 12/14/2014   TSH 1.34 12/14/2014   HGBA1C 5.2 05/31/2008    Ct Abdomen Pelvis W Contrast  10/22/2015  CLINICAL DATA:  Patient with history of recurrent urinary tract infections. Right-sided flank tenderness. Evaluate for renal abscess. EXAM: CT ABDOMEN AND PELVIS WITH CONTRAST TECHNIQUE: Multidetector CT imaging of the abdomen and pelvis was performed using the standard protocol following bolus administration of intravenous contrast. CONTRAST:  128m OMNIPAQUE IOHEXOL 300 MG/ML  SOLN COMPARISON:  CT abdomen pelvis 06/06/2015. FINDINGS: Lower chest: Normal heart size. Dependent atelectasis within the bilateral lower lobes. No pleural effusion. 6 mm well-circumscribed nodule anterior right lung base (image 8; series 3). Hepatobiliary: Liver is normal in size and contour. Patient status post cholecystectomy. No intrahepatic or extrahepatic biliary ductal dilatation. Pancreas: Unremarkable Spleen: Unremarkable Adrenals/Urinary Tract: The adrenal glands are normal. There is patchy hypoenhancement involving the inferior pole of the right kidney (image 47; series 5). Left kidney anterior is normally. No nephrolithiasis. No hydronephrosis. Urinary bladder is unremarkable. Stomach/Bowel: Sigmoid colonic diverticulosis. No CT evidence for acute diverticulitis. Normal appendix. No abnormal bowel wall thickening or evidence for bowel obstruction. No free fluid or free intraperitoneal air. Vascular/Lymphatic: Normal caliber abdominal aorta. Bilateral external iliac adenopathy is stable with the largest node measuring 15 mm (image 73; series 2). Other: Uterus is unremarkable.  Adnexal structures are unremarkable. Musculoskeletal: Lumbar spine degenerative changes. No aggressive or acute appearing osseous lesions. IMPRESSION:  Patchy hypoenhancement involving the inferior pole of the right kidney raising the possibility of pyelonephritis in the appropriate clinical setting. Sigmoid colonic diverticulosis without CT evidence for acute diverticulitis. Stable bilateral pelvic sidewall and external iliac lymphadenopathy, likely benign given stability over time. Re- demonstrated 6 mm nodule within the anterior right lung base. If the patient is at high risk for bronchogenic carcinoma, follow-up chest CT at 6-12 months is recommended. If the patient is at low risk for bronchogenic carcinoma, follow-up chest CT at 12 months is recommended. This recommendation follows the consensus statement: Guidelines for Management of Small Pulmonary Nodules Detected on CT Scans: A Statement from the [employer] published in Radiology 2005;237:395-400. Electronically Signed   By: DLovey NewcomerM.D.   On: 10/22/2015 18:07     Assessment & Plan:  Plan I have discontinued Ms. Figiel's ondansetron, HYDROcodone-acetaminophen, and ciprofloxacin. I am also having her maintain her Vitamin D, vitamin C, B Complex-C-Folic Acid (B-COMPLEX BALANCED PO), folic acid, calcium carbonate, Fish Oil, zinc gluconate, and aspirin EC.  No orders of the defined types were placed in this encounter.    Problem List Items Addressed This Visit      Unprioritized   Hyperglycemia - Primary    Check labs Pt given diabetic HO for diet/ carb counting etc If pt does have DM we will  bring her back in for Education and glucometer       Relevant Orders   Comp Met (CMET)   Hemoglobin A1c   Lipid panel   Microalbumin / creatinine urine ratio   POCT urinalysis dipstick    Other Visit Diagnoses    Cataract        Relevant Orders    Comp Met (CMET)    Hemoglobin A1c    Lipid panel    Microalbumin / creatinine urine ratio    POCT urinalysis dipstick       Follow-up: Return in about 3 months (around 03/17/2016).  Garnet Koyanagi, DO

## 2015-12-19 NOTE — Assessment & Plan Note (Signed)
Check labs Pt given diabetic HO for diet/ carb counting etc If pt does have DM we will bring her back in for Education and glucometer

## 2015-12-27 ENCOUNTER — Other Ambulatory Visit: Payer: Self-pay

## 2015-12-27 DIAGNOSIS — R809 Proteinuria, unspecified: Secondary | ICD-10-CM

## 2016-01-02 ENCOUNTER — Other Ambulatory Visit (INDEPENDENT_AMBULATORY_CARE_PROVIDER_SITE_OTHER): Payer: BLUE CROSS/BLUE SHIELD

## 2016-01-02 DIAGNOSIS — R809 Proteinuria, unspecified: Secondary | ICD-10-CM | POA: Diagnosis not present

## 2016-01-02 LAB — PROTEIN, URINE, 24 HOUR
PROTEIN 24H UR: 132 mg/(24.h) (ref <150)
PROTEIN, URINE: 8 mg/dL (ref 5–24)

## 2016-08-24 LAB — HM PAP SMEAR: HM Pap smear: HIGH

## 2016-09-11 ENCOUNTER — Encounter: Payer: Self-pay | Admitting: Family Medicine

## 2017-07-29 ENCOUNTER — Other Ambulatory Visit (HOSPITAL_COMMUNITY): Payer: Self-pay | Admitting: Obstetrics and Gynecology

## 2017-07-29 DIAGNOSIS — N979 Female infertility, unspecified: Secondary | ICD-10-CM

## 2017-08-02 ENCOUNTER — Ambulatory Visit (HOSPITAL_COMMUNITY)
Admission: RE | Admit: 2017-08-02 | Discharge: 2017-08-02 | Disposition: A | Discharge status: Home / Self Care | Payer: BLUE CROSS/BLUE SHIELD | Source: Ambulatory Visit | Attending: Obstetrics and Gynecology | Admitting: Obstetrics and Gynecology

## 2017-08-02 DIAGNOSIS — N970 Female infertility associated with anovulation: Secondary | ICD-10-CM | POA: Insufficient documentation

## 2017-08-02 DIAGNOSIS — N979 Female infertility, unspecified: Secondary | ICD-10-CM

## 2017-08-02 MED ORDER — IOPAMIDOL (ISOVUE-300) INJECTION 61%
30.0000 mL | Freq: Once | INTRAVENOUS | Status: AC | PRN
  Administered 2017-08-02: 6 mL

## 2018-04-03 DIAGNOSIS — N978 Female infertility of other origin: Secondary | ICD-10-CM | POA: Diagnosis not present

## 2018-05-02 ENCOUNTER — Encounter: Payer: Self-pay | Admitting: Family Medicine

## 2018-05-02 ENCOUNTER — Ambulatory Visit (INDEPENDENT_AMBULATORY_CARE_PROVIDER_SITE_OTHER): Payer: BLUE CROSS/BLUE SHIELD | Admitting: Family Medicine

## 2018-05-02 VITALS — BP 111/69 | HR 85 | Temp 99.0°F | Resp 16 | Ht 70.0 in | Wt 288.4 lb

## 2018-05-02 DIAGNOSIS — R5383 Other fatigue: Secondary | ICD-10-CM

## 2018-05-02 LAB — POC URINALSYSI DIPSTICK (AUTOMATED)
BILIRUBIN UA: NEGATIVE
GLUCOSE UA: NEGATIVE
Ketones, UA: NEGATIVE
Leukocytes, UA: NEGATIVE
Nitrite, UA: NEGATIVE
Protein, UA: NEGATIVE
RBC UA: NEGATIVE
Spec Grav, UA: 1.02 (ref 1.010–1.025)
Urobilinogen, UA: 1 E.U./dL
pH, UA: 6 (ref 5.0–8.0)

## 2018-05-02 LAB — COMPREHENSIVE METABOLIC PANEL
ALT: 14 U/L (ref 0–35)
AST: 16 U/L (ref 0–37)
Albumin: 3.9 g/dL (ref 3.5–5.2)
Alkaline Phosphatase: 86 U/L (ref 39–117)
BILIRUBIN TOTAL: 1.2 mg/dL (ref 0.2–1.2)
BUN: 9 mg/dL (ref 6–23)
CHLORIDE: 104 meq/L (ref 96–112)
CO2: 31 meq/L (ref 19–32)
Calcium: 9.5 mg/dL (ref 8.4–10.5)
Creatinine, Ser: 0.97 mg/dL (ref 0.40–1.20)
GFR: 80.21 mL/min (ref >60.00)
GLUCOSE: 104 mg/dL — AB (ref 70–99)
Potassium: 4.6 mEq/L (ref 3.5–5.1)
Sodium: 140 mEq/L (ref 135–145)
Total Protein: 7.5 g/dL (ref 6.0–8.3)

## 2018-05-02 LAB — CBC WITH DIFFERENTIAL/PLATELET
Basophils Absolute: 0 10*3/uL (ref 0.0–0.1)
Basophils Relative: 1.1 % (ref 0.0–3.0)
EOS ABS: 0 10*3/uL (ref 0.0–0.7)
Eosinophils Relative: 1.2 % (ref 0.0–5.0)
HCT: 40.2 % (ref 36.0–46.0)
HEMOGLOBIN: 13.5 g/dL (ref 12.0–15.0)
Lymphocytes Relative: 31.3 % (ref 12.0–46.0)
Lymphs Abs: 1.3 10*3/uL (ref 0.7–4.0)
MCHC: 33.6 g/dL (ref 30.0–36.0)
MCV: 95.1 fl (ref 78.0–100.0)
MONO ABS: 0.3 10*3/uL (ref 0.1–1.0)
Monocytes Relative: 7.1 % (ref 3.0–12.0)
Neutro Abs: 2.4 10*3/uL (ref 1.4–7.7)
Neutrophils Relative %: 59.3 % (ref 43.0–77.0)
Platelets: 182 10*3/uL (ref 150.0–400.0)
RBC: 4.23 Mil/uL (ref 3.87–5.11)
RDW: 13.1 % (ref 11.5–15.5)
WBC: 4.1 10*3/uL (ref 4.0–10.5)

## 2018-05-02 LAB — VITAMIN B12: VITAMIN B 12: 343 pg/mL (ref 211–911)

## 2018-05-02 LAB — TSH: TSH: 1.59 u[IU]/mL (ref 0.35–4.50)

## 2018-05-02 NOTE — Progress Notes (Signed)
Patient ID: Stacey Huerta, female    DOB: 1976/08/19  Age: 42 y.o. MRN: [id number]    Subjective:  Subjective  HPI Stacey Huerta presents for extreme fatigue x few months.  Normal periods.  No cp, sob or palp.     Review of Systems  Constitutional: Positive for fatigue. Negative for appetite change, diaphoresis and unexpected weight change.  Eyes: Negative for pain, redness and visual disturbance.  Respiratory: Negative for cough, chest tightness, shortness of breath and wheezing.   Cardiovascular: Negative for chest pain, palpitations and leg swelling.  Endocrine: Negative for cold intolerance, heat intolerance, polydipsia, polyphagia and polyuria.  Genitourinary: Negative for difficulty urinating, dysuria and frequency.  Neurological: Negative for dizziness, light-headedness, numbness and headaches.    History Past Medical History:  Diagnosis Date  . Allergy   . Fibroid   . Headache(784.0)   . Sjogren's syndrome ([place]) 10/28/2015    She has a past surgical history that includes Cholecystectomy; Myomectomy; Myomectomy (05/23/2012); Lymphadenectomy; Abdominal adhesion surgery; Endometrial ablation; and Cesarean section (N/A, 03/18/2014).   Her family history includes Arthritis in her paternal aunt; Breast cancer in her maternal aunt; Colon cancer in her paternal grandmother; Diabetes in her father, paternal grandfather, and paternal grandmother; Hyperlipidemia in her maternal aunt and mother; Hypertension in her paternal grandfather; Hypotension in her father; Prostate cancer in her maternal grandfather.She reports that she has never smoked. She has never used smokeless tobacco. She reports that she drinks alcohol. She reports that she does not use drugs.  Current Outpatient Medications on File Prior to Visit  Medication Sig Dispense Refill  . B Complex-C-Folic Acid (B-COMPLEX BALANCED PO) Take by mouth.    . calcium carbonate 200 MG capsule Take 250 mg by mouth 2 (two) times  daily with a meal.    . Cholecalciferol (VITAMIN D3) 50000 units CAPS Take 1 capsule by mouth daily.    Marland Kitchen letrozole (FEMARA) 2.5 MG tablet Take 5 mg by mouth daily.    Marland Kitchen MAGNESIUM PO Take 1 tablet by mouth daily.    . Nutritional Supplements (DHEA PO) Take 1 tablet by mouth daily.    . Omega-3 Fatty Acids (FISH OIL) 1000 MG CAPS Take by mouth.    . Prenatal Vit-Fe Fumarate-FA (PRENATAL VITAMIN PO) Take 1 tablet by mouth daily.    . SELENIUM PO Take 1 tablet by mouth daily.    Marland Kitchen VITAMIN E PO Take 1 tablet by mouth daily.    Marland Kitchen zinc gluconate 50 MG tablet Take 50 mg by mouth daily.    Marland Kitchen aspirin EC 81 MG tablet Take 1 tablet (81 mg total) by mouth daily. (Patient not taking: Reported on 05/02/2018)     No current facility-administered medications on file prior to visit.      Objective:  Objective  Physical Exam  Constitutional: She is oriented to person, place, and time. She appears well-developed and well-nourished.  HENT:  Head: Normocephalic and atraumatic.  Eyes: Conjunctivae and EOM are normal.  Neck: Normal range of motion. Neck supple. No JVD present. Carotid bruit is not present. No thyromegaly present.  Cardiovascular: Normal rate, regular rhythm and normal heart sounds.  No murmur heard. Pulmonary/Chest: Effort normal and breath sounds normal. No respiratory distress. She has no wheezes. She has no rales. She exhibits no tenderness.  Musculoskeletal: She exhibits no edema.  Neurological: She is alert and oriented to person, place, and time.  Psychiatric: She has a normal mood and affect.  Nursing note and vitals  reviewed.  BP 111/69 (BP Location: Right Arm, Cuff Size: Large)   Pulse 85   Temp 99 F (37.2 C) (Oral)   Resp 16   Ht 5\' 10"  (1.778 m)   Wt 288 lb 6.4 oz (130.8 kg)   LMP 04/20/2018   SpO2 98%   BMI 41.38 kg/m  Wt Readings from Last 3 Encounters:  05/02/18 288 lb 6.4 oz (130.8 kg)  12/19/15 (!) 341 lb 9.6 oz (154.9 kg)  10/28/15 (!) 340 lb 12.8 oz (154.6 kg)      Lab Results  Component Value Date   WBC 4.1 05/02/2018   HGB 13.5 05/02/2018   HCT 40.2 05/02/2018   PLT 182.0 05/02/2018   GLUCOSE 104 (H) 05/02/2018   CHOL 144 12/19/2015   TRIG 101.0 12/19/2015   HDL 34.40 (L) 12/19/2015   LDLCALC 90 12/19/2015   ALT 14 05/02/2018   AST 16 05/02/2018   NA 140 05/02/2018   K 4.6 05/02/2018   CL 104 05/02/2018   CREATININE 0.97 05/02/2018   BUN 9 05/02/2018   CO2 31 05/02/2018   TSH 1.59 05/02/2018   HGBA1C 5.2 12/19/2015   MICROALBUR 5.0 (H) 12/19/2015    Dg Hysterogram (hsg)  Result Date: 08/02/2017 CLINICAL DATA:  Desires fertility. EXAM: HYSTEROSALPINGOGRAM TECHNIQUE: Following cleansing of the cervix and vagina with Betadine solution, a hysterosalpingogram was performed using a 5-French hysterosalpingogram catheter and Omnipaque 300 contrast. The patient tolerated the examination without difficulty. COMPARISON:  None. FLUOROSCOPY TIME:  Radiation Exposure Index (as provided by the fluoroscopic device): If the device does not provide the exposure index: Fluoroscopy Time:  42 seconds Number of Acquired Images:  0 FINDINGS: The endometrial cavity is normal in appearance and contour. No signs of mullerian duct anomaly. Opacification of both fallopian tubes is seen. Both tubes appear normal. Intraperitoneal spill of contrast from both fallopian tubes is demonstrated. IMPRESSION: Normal study. Both fallopian tubes are patent. Electronically Signed   By: Kerby Moors M.D.   On: 08/02/2017 09:06     Assessment & Plan:  Plan  I have discontinued Tessie A. Dorminey's Vitamin D, vitamin C, and folic acid. I am also having her maintain her B Complex-C-Folic Acid (B-COMPLEX BALANCED PO), calcium carbonate, Fish Oil, zinc gluconate, aspirin EC, letrozole, Prenatal Vit-Fe Fumarate-FA (PRENATAL VITAMIN PO), SELENIUM PO, MAGNESIUM PO, Nutritional Supplements (DHEA PO), VITAMIN E PO, and Vitamin D3.  No orders of the defined types were placed in this  encounter.   Problem List Items Addressed This Visit      Unprioritized   Morbid obesity ([place])   Relevant Orders   Amb Ref to Medical Weight Management    Other Visit Diagnoses    Fatigue, unspecified type    -  Primary   Relevant Orders   CBC with Differential/Platelet (Completed)   TSH (Completed)   Vitamin B12 (Completed)   Vitamin D 1,25 dihydroxy   Comprehensive metabolic panel (Completed)   POCT Urinalysis Dipstick (Automated) (Completed)   POCT urine pregnancy    pt has ordered b12 vita  She is taking prenatals  She is under the care of a fertility specialist in [place] Check labs  Follow-up: Return if symptoms worsen or fail to improve.  Ann Held, DO

## 2018-05-02 NOTE — Patient Instructions (Signed)

## 2018-05-05 LAB — VITAMIN D 1,25 DIHYDROXY
VITAMIN D 1, 25 (OH) TOTAL: 37 pg/mL (ref 18–72)
VITAMIN D3 1, 25 (OH): 37 pg/mL

## 2018-05-08 ENCOUNTER — Telehealth: Payer: Self-pay | Admitting: *Deleted

## 2018-05-08 NOTE — Telephone Encounter (Signed)
Patient is returning call for the 3rd time to get test results.  Would really like a call back today.  CB# [phone].

## 2018-05-08 NOTE — Telephone Encounter (Signed)
Copied from [place] [id number]. Topic: General - Other >> May 07, 2018  3:57 PM Neva Seat wrote: Pt asking for a call back after 12 noon on Thurs for lab results.

## 2018-05-08 NOTE — Telephone Encounter (Signed)
Pt calling back to get lab results. Please advise.

## 2018-05-09 ENCOUNTER — Ambulatory Visit: Payer: BLUE CROSS/BLUE SHIELD

## 2018-05-09 ENCOUNTER — Ambulatory Visit (INDEPENDENT_AMBULATORY_CARE_PROVIDER_SITE_OTHER): Payer: BLUE CROSS/BLUE SHIELD

## 2018-05-09 DIAGNOSIS — E538 Deficiency of other specified B group vitamins: Secondary | ICD-10-CM | POA: Diagnosis not present

## 2018-05-09 MED ORDER — CYANOCOBALAMIN 1000 MCG/ML IJ SOLN
1000.0000 ug | Freq: Once | INTRAMUSCULAR | Status: AC
  Administered 2018-05-09: 1000 ug via INTRAMUSCULAR

## 2018-05-09 NOTE — Progress Notes (Signed)
Jonthan Leite R Lowne Chase, DO 

## 2018-05-09 NOTE — Progress Notes (Deleted)
Pre visit review using our clinic review tool, if applicable. No additional management support is needed unless otherwise documented below in the visit note.   Pt here for monthly B12 injection per pcp Dr. Carollee Herter  B12 1069mcg given IM left deltoid, and pt tolerated injection well.  Next B12 injection scheduled for

## 2018-05-09 NOTE — Progress Notes (Signed)
Pre visit review using our clinic review tool, if applicable. No additional management support is needed unless otherwise documented below in the visit note.  Pt here for monthly B12 injection per pcp Dr. Carollee Herter  B12 1028mcg given IM, left deltoid and pt tolerated injection well.  Next B12 injection scheduled for August 23rd at 10:00

## 2018-05-09 NOTE — Telephone Encounter (Signed)
Spoke with patient yesterday about results.

## 2018-06-13 ENCOUNTER — Ambulatory Visit (INDEPENDENT_AMBULATORY_CARE_PROVIDER_SITE_OTHER): Payer: BLUE CROSS/BLUE SHIELD

## 2018-06-13 DIAGNOSIS — E538 Deficiency of other specified B group vitamins: Secondary | ICD-10-CM

## 2018-06-13 MED ORDER — CYANOCOBALAMIN 1000 MCG/ML IJ SOLN: 1000.0000 ug | Freq: Once | INTRAMUSCULAR | Status: DC

## 2018-06-13 NOTE — Progress Notes (Signed)
Pre visit review using our clinic tool,if applicable. No additional management support is needed unless otherwise documented below in the visit note.  Pt here for monthly B12 injection per order from Dr. Floy Sabina.  B12 1046mcg given IM left deltoid, pt tolerated injection well.  No complaints voiced.  Next B12 injection scheduled for 07/18/18. Patient aware.

## 2018-06-13 NOTE — Progress Notes (Signed)
Noted  Yvonne R Lowne Chase, DO  

## 2018-06-25 ENCOUNTER — Encounter (INDEPENDENT_AMBULATORY_CARE_PROVIDER_SITE_OTHER): Payer: BLUE CROSS/BLUE SHIELD

## 2018-06-26 ENCOUNTER — Encounter (INDEPENDENT_AMBULATORY_CARE_PROVIDER_SITE_OTHER): Payer: Self-pay | Admitting: Family Medicine

## 2018-06-26 ENCOUNTER — Ambulatory Visit (INDEPENDENT_AMBULATORY_CARE_PROVIDER_SITE_OTHER): Payer: BLUE CROSS/BLUE SHIELD | Admitting: Family Medicine

## 2018-06-26 VITALS — BP 95/68 | HR 82 | Ht 67.0 in | Wt 290.0 lb

## 2018-06-26 DIAGNOSIS — Z9189 Other specified personal risk factors, not elsewhere classified: Secondary | ICD-10-CM | POA: Diagnosis not present

## 2018-06-26 DIAGNOSIS — R5383 Other fatigue: Secondary | ICD-10-CM

## 2018-06-26 DIAGNOSIS — R739 Hyperglycemia, unspecified: Secondary | ICD-10-CM | POA: Diagnosis not present

## 2018-06-26 DIAGNOSIS — R0602 Shortness of breath: Secondary | ICD-10-CM

## 2018-06-26 DIAGNOSIS — E559 Vitamin D deficiency, unspecified: Secondary | ICD-10-CM

## 2018-06-26 DIAGNOSIS — Z6841 Body Mass Index (BMI) 40.0 and over, adult: Secondary | ICD-10-CM

## 2018-06-26 DIAGNOSIS — Z1331 Encounter for screening for depression: Secondary | ICD-10-CM

## 2018-06-26 DIAGNOSIS — Z0289 Encounter for other administrative examinations: Secondary | ICD-10-CM

## 2018-06-26 NOTE — Progress Notes (Signed)
Office: [phone]  /  Fax: [phone]   Dear Dr. Carollee Herter,   Thank you for referring Stacey Huerta to our clinic. The following note includes my evaluation and treatment recommendations.  HPI:   Chief Complaint: OBESITY    Stacey Huerta has been referred by Ann Held, DO for consultation regarding her obesity and obesity related comorbidities.    Stacey Huerta (MR# [id number]) is a 42 y.o. female who presents on 06/26/2018 for obesity evaluation and treatment. Current BMI is Body mass index is 45.42 kg/m.Stacey Huerta has been struggling with her weight for many years and has been unsuccessful in either losing weight, maintaining weight loss, or reaching her healthy weight goal. Stacey Huerta is undergoing fertility treatment and she needs to lose weight in order to optimize her chances of a successful pregnancy.     Stacey Huerta attended our information session and states she is currently in the action stage of change and ready to dedicate time achieving and maintaining a healthier weight. Stacey Huerta is interested in becoming our patient and working on intensive lifestyle modifications including (but not limited to) diet, exercise and weight loss.    Stacey Huerta states her family eats meals together she struggles with family and or coworkers weight loss sabotage her desired weight loss is 75 lbs she has been heavy most of  her life she started gaining weight at age 39 her heaviest weight ever was 340 lbs. she is a picky eater and doesn't like to eat healthier foods  she has significant food cravings issues  she skips meals frequently she is frequently drinking liquids with calories she frequently makes poor food choices she frequently eats larger portions than normal  she struggles with emotional eating    Fatigue Azaliyah feels her energy is lower than it should be. This has worsened with weight gain and has not worsened recently. Markiesha admits to daytime somnolence  and  denies waking up still tired. Patient is at risk for obstructive sleep apnea. Patent has a history of symptoms of daytime fatigue and morning headache. Patient generally gets 8 or 9 hours of sleep per night, and states they generally have restful sleep. Snoring is not present. Apneic episodes are not present. Epworth Sleepiness Score is 6  Dyspnea on exertion Stacey Huerta notes increasing shortness of breath with exercising and seems to be worsening over time with weight gain. She notes getting out of breath sooner with activity than she used to. This has not gotten worse recently. Stacey Huerta denies orthopnea.  Vitamin D deficiency [place]aka has a diagnosis of vitamin D deficiency. Stacey Huerta is currently taking OTC vit D 1,000 IU daily and she is not yet at goal. Stacey Huerta admits fatigue and denies nausea, vomiting or muscle weakness.  Hyperglycemia Stacey Huerta has a history of some elevated blood glucose readings without a diagnosis of diabetes and there is no recent Hgb A1c result.. She denies polyphagia.  At risk for cardiovascular disease Stacey Huerta is at a higher than average risk for cardiovascular disease due to obesity. She currently denies any chest pain.  Depression Screen Stacey Huerta's Food and Mood (modified PHQ-9) score was  Depression screen PHQ 2/9 06/26/2018  Decreased Interest 2  Down, Depressed, Hopeless 1  PHQ - 2 Score 3  Altered sleeping 0  Tired, decreased energy 2  Change in appetite 3  Feeling bad or failure about yourself  2  Trouble concentrating 1  Moving slowly or fidgety/restless 2  Suicidal thoughts 0  PHQ-9 Score 13  Difficult doing work/chores  Somewhat difficult    ALLERGIES: Allergies  Allergen Reactions  . Eggs Or Egg-Derived Products   . Sulfonamide Derivatives Hives    Also high fever  . Ganoderma Lucidum   . [place]   . Other     Mushrooms and [place] Nuts  . Penicillins     Childhood reaction  . Shellfish Allergy Nausea And Vomiting  . Fish-Derived Products  Rash    MEDICATIONS: Current Outpatient Medications on File Prior to Visit  Medication Sig Dispense Refill  . aspirin EC 81 MG tablet Take 1 tablet (81 mg total) by mouth daily.    . Cyanocobalamin (VITAMIN B-12) 1000 MCG SUBL Place under the tongue.    . Cyanocobalamin (VITAMIN B-12) 1000 MCG/15ML LIQD Take by mouth.    . estradiol (ESTRACE) 2 MG tablet Take 2 mg by mouth daily.    Marland Kitchen letrozole (FEMARA) 2.5 MG tablet Take 5 mg by mouth daily.    Marland Kitchen MAGNESIUM PO Take 1 tablet by mouth daily.    . Probiotic Product (PROBIOTIC-10 PO) Take by mouth.    . pyridOXINE (VITAMIN B-6) 50 MG tablet Take 50 mg by mouth daily.    Marland Kitchen VITAMIN E PO Take 1 tablet by mouth daily.    Marland Kitchen zinc gluconate 50 MG tablet Take 50 mg by mouth daily.    . B Complex-C-Folic Acid (B-COMPLEX BALANCED PO) Take by mouth.    . calcium carbonate 200 MG capsule Take 250 mg by mouth 2 (two) times daily with a meal.    . Cholecalciferol (VITAMIN D3) 50000 units CAPS Take 1 capsule by mouth daily.    . Nutritional Supplements (DHEA PO) Take 1 tablet by mouth daily.    . Omega-3 Fatty Acids (FISH OIL) 1000 MG CAPS Take by mouth.    . Prenatal Vit-Fe Fumarate-FA (PRENATAL VITAMIN PO) Take 1 tablet by mouth daily.    . SELENIUM PO Take 1 tablet by mouth daily.     Current Facility-Administered Medications on File Prior to Visit  Medication Dose Route Frequency Provider Last Rate Last Dose  . cyanocobalamin ((VITAMIN B-12)) injection 1,000 mcg  1,000 mcg Intramuscular Once Ann Held, DO        PAST MEDICAL HISTORY: Past Medical History:  Diagnosis Date  . Allergy   . Back pain   . Dyspnea   . Fibroid   . Headache(784.0)   . Hypertension during pregnancy   . Infertility, female   . Leg edema   . Osteoarthritis   . Postpartum depression   . Sjogren's syndrome ([place]) 10/28/2015  . Vitamin B 12 deficiency   . Vitamin D deficiency     PAST SURGICAL HISTORY: Past Surgical History:  Procedure Laterality Date  .  ABDOMINAL ADHESION SURGERY    . CESAREAN SECTION N/A 03/18/2014   Procedure: CESAREAN SECTION;  Surgeon: Delice Lesch, MD;  Location: [facility];  Service: Obstetrics;  Laterality: N/A;  spinal/epidural  . CHOLECYSTECTOMY    . ENDOMETRIAL ABLATION    . LYMPHADENECTOMY     Removed from the neck at age 10  . MYOMECTOMY    . MYOMECTOMY  05/23/2012   Procedure: MYOMECTOMY;  Surgeon: Governor Specking, MD;  Location: [facility];  Service: Gynecology;  Laterality: N/A;    SOCIAL HISTORY: Social History   Tobacco Use  . Smoking status: Never Smoker  . Smokeless tobacco: Never Used  Substance Use Topics  . Alcohol use: Yes    Alcohol/week: 0.0 standard drinks  Comment: socially  . Drug use: No    FAMILY HISTORY: Family History  Problem Relation Age of Onset  . Hypertension Paternal Grandfather   . Diabetes Paternal Grandfather   . Diabetes Father   . Hypotension Father   . Colon cancer Paternal Grandmother   . Diabetes Paternal Grandmother   . Prostate cancer Maternal Grandfather   . Hyperlipidemia Mother   . Obesity Mother   . Breast cancer Maternal Aunt   . Arthritis Paternal Aunt   . Hyperlipidemia Maternal Aunt        x's 2    ROS: Review of Systems  Constitutional: Positive for malaise/fatigue.  HENT: Positive for congestion (nasal stuffiness).   Eyes:       Vision Changes   Respiratory: Positive for shortness of breath (with activity).   Cardiovascular: Negative for chest pain and orthopnea.       Very Cold Feet or Hands  Gastrointestinal: Negative for nausea and vomiting.  Musculoskeletal: Positive for back pain.       Muscle or Joint Pain Negative for muscle weakness  Skin: Positive for rash.       Dryness   Endo/Heme/Allergies:       Heat or cold Intolerance Negative for polyphagia    PHYSICAL EXAM: Blood pressure 95/68, pulse 82, height 5\' 7"  (1.702 m), weight 290 lb (131.5 kg), last menstrual period 06/21/2018, SpO2 100 %. Body mass index is 45.42  kg/m. Physical Exam  Constitutional: She is oriented to person, place, and time. She appears well-developed and well-nourished.  HENT:  Head: Normocephalic and atraumatic.  Nose: Nose normal.  Eyes: EOM are normal. No scleral icterus.  Neck: Normal range of motion. Neck supple. No thyromegaly present.  Cardiovascular: Normal rate and regular rhythm.  Pulmonary/Chest: Effort normal. No respiratory distress.  Abdominal: Soft. There is no tenderness.  + obesity  Musculoskeletal: Normal range of motion.  Range of Motion normal in all 4 extremities  Neurological: She is alert and oriented to person, place, and time. Coordination normal.  Skin: Skin is warm and dry.  + Acanthosis Nigricans  Psychiatric: She has a normal mood and affect. Her behavior is normal.  Vitals reviewed.   RECENT LABS AND TESTS: BMET    Component Value Date/Time   NA 140 05/02/2018 1216   K 4.6 05/02/2018 1216   CL 104 05/02/2018 1216   CO2 31 05/02/2018 1216   GLUCOSE 104 (H) 05/02/2018 1216   BUN 9 05/02/2018 1216   CREATININE 0.97 05/02/2018 1216   CREATININE 0.98 08/19/2014 1223   CALCIUM 9.5 05/02/2018 1216   GFRNONAA 72 (L) 03/19/2014 2230   GFRAA 83 (L) 03/19/2014 2230   Lab Results  Component Value Date   HGBA1C 5.2 12/19/2015   No results found for: INSULIN CBC    Component Value Date/Time   WBC 4.1 05/02/2018 1216   RBC 4.23 05/02/2018 1216   HGB 13.5 05/02/2018 1216   HCT 40.2 05/02/2018 1216   PLT 182.0 05/02/2018 1216   MCV 95.1 05/02/2018 1216   MCV 93.6 08/19/2014 1226   MCH 30.0 08/19/2014 1226   MCH 32.4 03/20/2014 0812   MCHC 33.6 05/02/2018 1216   RDW 13.1 05/02/2018 1216   LYMPHSABS 1.3 05/02/2018 1216   MONOABS 0.3 05/02/2018 1216   EOSABS 0.0 05/02/2018 1216   BASOSABS 0.0 05/02/2018 1216   Iron/TIBC/Ferritin/ %Sat No results found for: IRON, TIBC, FERRITIN, IRONPCTSAT Lipid Panel     Component Value Date/Time   CHOL 144 12/19/2015  0852   TRIG 101.0 12/19/2015  0852   HDL 34.40 (L) 12/19/2015 0852   CHOLHDL 4 12/19/2015 0852   VLDL 20.2 12/19/2015 0852   LDLCALC 90 12/19/2015 0852   Hepatic Function Panel     Component Value Date/Time   PROT 7.5 05/02/2018 1216   ALBUMIN 3.9 05/02/2018 1216   AST 16 05/02/2018 1216   ALT 14 05/02/2018 1216   ALKPHOS 86 05/02/2018 1216   BILITOT 1.2 05/02/2018 1216   BILIDIR 0.1 12/14/2014 1402   IBILI 0.9 04/30/2008 2117      Component Value Date/Time   TSH 1.59 05/02/2018 1216   TSH 1.34 12/14/2014 1402   TSH 0.97 03/19/2013 1435    ECG  shows NSR with a rate of 94 BPM INDIRECT CALORIMETER done today shows a VO2 of 300 and a REE of 2087.  Her calculated basal metabolic rate is 9470 thus her basal metabolic rate is worse than expected.    ASSESSMENT AND PLAN: Other fatigue - Plan: EKG 12-Lead, Vitamin B12, Folate, Lipid Panel With LDL/HDL Ratio, T3, T4, free, TSH, CBC With Differential  Shortness of breath on exertion - Plan: CBC With Differential  Vitamin D deficiency - Plan: VITAMIN D 25 Hydroxy (Vit-D Deficiency, Fractures)  Hyperglycemia - Plan: Comprehensive metabolic panel, Hemoglobin A1c, Insulin, random  Depression screening  At risk for heart disease  Class 3 severe obesity with serious comorbidity and body mass index (BMI) of 45.0 to 49.9 in adult, unspecified obesity type (HCC)  PLAN: Fatigue Stacey Huerta was informed that her fatigue may be related to obesity, depression or many other causes. Labs will be ordered, and in the meanwhile [place]idian has agreed to work on diet, exercise and weight loss to help with fatigue. Proper sleep hygiene was discussed including the need for 7-8 hours of quality sleep each night. A sleep study was not ordered based on symptoms and Epworth score.  Dyspnea on exertion Linsy's shortness of breath appears to be obesity related and exercise induced. She has agreed to work on weight loss and gradually increase exercise to treat her exercise induced  shortness of breath. If Hellena follows our instructions and loses weight without improvement of her shortness of breath, we will plan to refer to pulmonology. We will monitor this condition regularly. Tanzie agrees to this plan.  Vitamin D Deficiency Stacey Huerta was informed that low vitamin D levels contributes to fatigue and are associated with obesity, breast, and colon cancer. She agrees to continue to take OTC Vit D @1 ,000 IU daily. We will check labs and follow. Stacey Huerta will follow up for routine testing of vitamin D, at least 2-3 times per year. She was informed of the risk of over-replacement of vitamin D and agrees to not increase her dose unless she discusses this with [place] first.  Hyperglycemia Fasting labs will be obtained and results with be discussed with Stacey Huerta in 2 weeks at her follow up visit. In the meanwhile Stacey Huerta was started on a lower simple carbohydrate diet and will work on weight loss efforts.  Cardiovascular risk counseling Stacey Huerta was given extended (15 minutes) coronary artery disease prevention counseling today. She is 42 y.o. female and has risk factors for heart disease including obesity. We discussed intensive lifestyle modifications today with an emphasis on specific weight loss instructions and strategies. Pt was also informed of the importance of increasing exercise and decreasing saturated fats to help prevent heart disease.  Depression Screen Stacey Huerta had a moderately positive depression screening. Depression is commonly associated  with obesity and often results in emotional eating behaviors. We will monitor this closely and work on CBT to help improve the non-hunger eating patterns. Referral to Psychology may be required if no improvement is seen as she continues in our clinic.  Obesity Stacey Huerta is currently in the action stage of change and her goal is to continue with weight loss efforts. I recommend Chala begin the structured treatment plan as follows:  She has  agreed to follow the Category 3 plan Nadya has been instructed to eventually work up to a goal of 150 minutes of combined cardio and strengthening exercise per week for weight loss and overall health benefits. We discussed the following Behavioral Modification Strategies today: increasing lean protein intake, decreasing simple carbohydrates , decrease eating out and work on meal planning and easy cooking plans   She was informed of the importance of frequent follow up visits to maximize her success with intensive lifestyle modifications for her multiple health conditions. She was informed we would discuss her lab results at her next visit unless there is a critical issue that needs to be addressed sooner. Stacey Huerta agreed to keep her next visit at the agreed upon time to discuss these results.    OBESITY BEHAVIORAL INTERVENTION VISIT  Today's visit was # 1   Starting weight: 290 lbs Starting date: 06/26/18 Today's weight : 290 lbs  Today's date: 06/26/2018 Total lbs lost to date: 0   ASK: We discussed the diagnosis of obesity with Diksha Ava Pamella Pert today and Jasmina agreed to give [place] permission to discuss obesity behavioral modification therapy today.  ASSESS: Daphne has the diagnosis of obesity and her BMI today is 45.41 Tandi is in the action stage of change   ADVISE: Venice was educated on the multiple health risks of obesity as well as the benefit of weight loss to improve her health. She was advised of the need for long term treatment and the importance of lifestyle modifications to improve her current health and to decrease her risk of future health problems.  AGREE: Multiple dietary modification options and treatment options were discussed and  Anaira agreed to follow the recommendations documented in the above note.  ARRANGE: Alyana was educated on the importance of frequent visits to treat obesity as outlined per CMS and USPSTF guidelines and agreed to schedule her next  follow up appointment today.  I, Doreene Nest, am acting as transcriptionist for Dennard Nip, MD   I have reviewed the above documentation for accuracy and completeness, and I agree with the above. -Dennard Nip, MD

## 2018-06-27 LAB — COMPREHENSIVE METABOLIC PANEL
A/G RATIO: 1.1 — AB (ref 1.2–2.2)
ALBUMIN: 3.9 g/dL (ref 3.5–5.5)
ALT: 18 IU/L (ref 0–32)
AST: 19 IU/L (ref 0–40)
Alkaline Phosphatase: 98 IU/L (ref 39–117)
BUN / CREAT RATIO: 12 (ref 9–23)
BUN: 10 mg/dL (ref 6–24)
Bilirubin Total: 0.9 mg/dL (ref 0.0–1.2)
CALCIUM: 9.2 mg/dL (ref 8.7–10.2)
CHLORIDE: 101 mmol/L (ref 96–106)
CO2: 23 mmol/L (ref 20–29)
Creatinine, Ser: 0.83 mg/dL (ref 0.57–1.00)
GFR calc non Af Amer: 86 mL/min/{1.73_m2} (ref >59)
GFR, EST AFRICAN AMERICAN: 99 mL/min/{1.73_m2} (ref >59)
GLOBULIN, TOTAL: 3.6 g/dL (ref 1.5–4.5)
Glucose: 83 mg/dL (ref 65–99)
Potassium: 4.4 mmol/L (ref 3.5–5.2)
SODIUM: 137 mmol/L (ref 134–144)
TOTAL PROTEIN: 7.5 g/dL (ref 6.0–8.5)

## 2018-06-27 LAB — LIPID PANEL WITH LDL/HDL RATIO
CHOLESTEROL TOTAL: 161 mg/dL (ref 100–199)
HDL: 43 mg/dL (ref >39)
LDL Calculated: 103 mg/dL — ABNORMAL HIGH (ref 0–99)
LDl/HDL Ratio: 2.4 ratio (ref 0.0–3.2)
Triglycerides: 74 mg/dL (ref 0–149)
VLDL CHOLESTEROL CAL: 15 mg/dL (ref 5–40)

## 2018-06-27 LAB — HEMOGLOBIN A1C
Est. average glucose Bld gHb Est-mCnc: 97 mg/dL
Hgb A1c MFr Bld: 5 % (ref 4.8–5.6)

## 2018-06-27 LAB — CBC WITH DIFFERENTIAL
BASOS ABS: 0 10*3/uL (ref 0.0–0.2)
Basos: 0 %
EOS (ABSOLUTE): 0 10*3/uL (ref 0.0–0.4)
Eos: 1 %
HEMOGLOBIN: 13.6 g/dL (ref 11.1–15.9)
Hematocrit: 41.1 % (ref 34.0–46.6)
Immature Grans (Abs): 0 10*3/uL (ref 0.0–0.1)
Immature Granulocytes: 0 %
Lymphocytes Absolute: 1.3 10*3/uL (ref 0.7–3.1)
Lymphs: 29 %
MCH: 31.1 pg (ref 26.6–33.0)
MCHC: 33.1 g/dL (ref 31.5–35.7)
MCV: 94 fL (ref 79–97)
MONOCYTES: 7 %
Monocytes Absolute: 0.3 10*3/uL (ref 0.1–0.9)
NEUTROS ABS: 2.8 10*3/uL (ref 1.4–7.0)
Neutrophils: 63 %
RBC: 4.38 x10E6/uL (ref 3.77–5.28)
RDW: 12 % — ABNORMAL LOW (ref 12.3–15.4)
WBC: 4.4 10*3/uL (ref 3.4–10.8)

## 2018-06-27 LAB — VITAMIN B12: Vitamin B-12: 916 pg/mL (ref 232–1245)

## 2018-06-27 LAB — T3: T3 TOTAL: 110 ng/dL (ref 71–180)

## 2018-06-27 LAB — FOLATE: Folate: 7.6 ng/mL (ref >3.0)

## 2018-06-27 LAB — TSH: TSH: 2.42 u[IU]/mL (ref 0.450–4.500)

## 2018-06-27 LAB — INSULIN, RANDOM: INSULIN: 15.4 u[IU]/mL (ref 2.6–24.9)

## 2018-06-27 LAB — T4, FREE: Free T4: 1.15 ng/dL (ref 0.82–1.77)

## 2018-06-27 LAB — VITAMIN D 25 HYDROXY (VIT D DEFICIENCY, FRACTURES): Vit D, 25-Hydroxy: 32.8 ng/mL (ref 30.0–100.0)

## 2018-06-30 ENCOUNTER — Encounter (INDEPENDENT_AMBULATORY_CARE_PROVIDER_SITE_OTHER): Payer: Self-pay | Admitting: Family Medicine

## 2018-07-01 ENCOUNTER — Encounter: Payer: Self-pay | Admitting: Family Medicine

## 2018-07-04 NOTE — Telephone Encounter (Signed)
Dr.beasley °

## 2018-07-04 NOTE — Telephone Encounter (Signed)
Dr Leafy Ro normally will take care of that--- has she not gone over the results with the pt yet.?

## 2018-07-04 NOTE — Telephone Encounter (Signed)
Dr Leafy Ro normally with prescribe vita d if it comes back low ---

## 2018-07-15 ENCOUNTER — Ambulatory Visit (INDEPENDENT_AMBULATORY_CARE_PROVIDER_SITE_OTHER): Payer: BLUE CROSS/BLUE SHIELD | Admitting: Family Medicine

## 2018-07-15 VITALS — BP 125/71 | HR 81 | Temp 98.2°F | Ht 67.0 in | Wt 281.0 lb

## 2018-07-15 DIAGNOSIS — E559 Vitamin D deficiency, unspecified: Secondary | ICD-10-CM

## 2018-07-15 DIAGNOSIS — Z6841 Body Mass Index (BMI) 40.0 and over, adult: Secondary | ICD-10-CM

## 2018-07-15 DIAGNOSIS — E8881 Metabolic syndrome: Secondary | ICD-10-CM

## 2018-07-15 DIAGNOSIS — Z9189 Other specified personal risk factors, not elsewhere classified: Secondary | ICD-10-CM

## 2018-07-15 MED ORDER — METFORMIN HCL 500 MG PO TABS: 500.0000 mg | ORAL_TABLET | Freq: Every day | ORAL | 0 refills | Status: DC

## 2018-07-15 MED ORDER — VITAMIN D (ERGOCALCIFEROL) 1.25 MG (50000 UNIT) PO CAPS: 50000.0000 [IU] | ORAL_CAPSULE | ORAL | 0 refills | Status: DC

## 2018-07-16 NOTE — Progress Notes (Signed)
Office: [phone]  /  Fax: [phone]   HPI:   Chief Complaint: OBESITY Stacey Huerta is here to discuss her progress with her obesity treatment plan. She is on the  follow the Category 3 plan and is following her eating plan approximately 80 % of the time. She states she is exercising 60 minutes 4 times per week. Stacey Huerta is doing very well on the Cat 3 meal plan. She did some substitutes with her yogurt and milk due to not liking these foods. She states her hunger felt well controlled.  Her weight is 281 lb (127.5 kg) today and has had a weight loss of 9 pounds over a period of 2 weeks since her last visit. She has lost 9 lbs since starting treatment with [place].  Vitamin D deficiency Stacey Huerta has a diagnosis of vitamin D deficiency. She is not currently taking vit D and denies nausea, vomiting or muscle weakness. She is positive for fatigue.   Insulin Resistance Stacey Huerta has a new diagnosis of insulin resistance based on her elevated fasting insulin level >5. Although Stacey Huerta's blood glucose readings are still under good control, insulin resistance puts her at greater risk of metabolic syndrome and diabetes. She is not taking metformin currently and continues to work on diet and exercise to decrease risk of diabetes. She does admit to polyphagia.  At risk for diabetes Stacey Huerta is at higher than averagerisk for developing diabetes due to her obesity. She currently denies polyuria or polydipsia.   ALLERGIES: Allergies  Allergen Reactions  . Eggs Or Egg-Derived Products   . Sulfonamide Derivatives Hives    Also high fever  . Ganoderma Lucidum   . [place]   . Other     Mushrooms and [place] Nuts  . Penicillins     Childhood reaction  . Shellfish Allergy Nausea And Vomiting  . Fish-Derived Products Rash    MEDICATIONS: Current Outpatient Medications on File Prior to Visit  Medication Sig Dispense Refill  . aspirin EC 81 MG tablet Take 1 tablet (81 mg total) by mouth daily.    . B  Complex-C-Folic Acid (B-COMPLEX BALANCED PO) Take by mouth.    . calcium carbonate 200 MG capsule Take 250 mg by mouth 2 (two) times daily with a meal.    . Cholecalciferol (VITAMIN D3) 50000 units CAPS Take 1 capsule by mouth daily.    . Cyanocobalamin (VITAMIN B-12) 1000 MCG SUBL Place under the tongue.    . Cyanocobalamin (VITAMIN B-12) 1000 MCG/15ML LIQD Take by mouth.    . estradiol (ESTRACE) 2 MG tablet Take 2 mg by mouth daily.    Marland Kitchen letrozole (FEMARA) 2.5 MG tablet Take 5 mg by mouth daily.    Marland Kitchen MAGNESIUM PO Take 1 tablet by mouth daily.    . Nutritional Supplements (DHEA PO) Take 1 tablet by mouth daily.    . Omega-3 Fatty Acids (FISH OIL) 1000 MG CAPS Take by mouth.    . Prenatal Vit-Fe Fumarate-FA (PRENATAL VITAMIN PO) Take 1 tablet by mouth daily.    . Probiotic Product (PROBIOTIC-10 PO) Take by mouth.    . progesterone (PROMETRIUM) 200 MG capsule Take 200 mg by mouth 2 (two) times daily.    Marland Kitchen pyridOXINE (VITAMIN B-6) 50 MG tablet Take 50 mg by mouth daily.    . SELENIUM PO Take 1 tablet by mouth daily.    Marland Kitchen VITAMIN E PO Take 1 tablet by mouth daily.    Marland Kitchen zinc gluconate 50 MG tablet Take 50 mg by  mouth daily.     Current Facility-Administered Medications on File Prior to Visit  Medication Dose Route Frequency Provider Last Rate Last Dose  . cyanocobalamin ((VITAMIN B-12)) injection 1,000 mcg  1,000 mcg Intramuscular Once Ann Held, DO        PAST MEDICAL HISTORY: Past Medical History:  Diagnosis Date  . Allergy   . Back pain   . Dyspnea   . Fibroid   . Headache(784.0)   . Hypertension during pregnancy   . Infertility, female   . Leg edema   . Osteoarthritis   . Postpartum depression   . Sjogren's syndrome ([place]) 10/28/2015  . Vitamin B 12 deficiency   . Vitamin D deficiency     PAST SURGICAL HISTORY: Past Surgical History:  Procedure Laterality Date  . ABDOMINAL ADHESION SURGERY    . CESAREAN SECTION N/A 03/18/2014   Procedure: CESAREAN SECTION;   Surgeon: Delice Lesch, MD;  Location: [facility];  Service: Obstetrics;  Laterality: N/A;  spinal/epidural  . CHOLECYSTECTOMY    . ENDOMETRIAL ABLATION    . LYMPHADENECTOMY     Removed from the neck at age 67  . MYOMECTOMY    . MYOMECTOMY  05/23/2012   Procedure: MYOMECTOMY;  Surgeon: Governor Specking, MD;  Location: [facility];  Service: Gynecology;  Laterality: N/A;    SOCIAL HISTORY: Social History   Tobacco Use  . Smoking status: Never Smoker  . Smokeless tobacco: Never Used  Substance Use Topics  . Alcohol use: Yes    Alcohol/week: 0.0 standard drinks    Comment: socially  . Drug use: No    FAMILY HISTORY: Family History  Problem Relation Age of Onset  . Hypertension Paternal Grandfather   . Diabetes Paternal Grandfather   . Diabetes Father   . Hypotension Father   . Colon cancer Paternal Grandmother   . Diabetes Paternal Grandmother   . Prostate cancer Maternal Grandfather   . Hyperlipidemia Mother   . Obesity Mother   . Breast cancer Maternal Aunt   . Arthritis Paternal Aunt   . Hyperlipidemia Maternal Aunt        x's 2    ROS: Review of Systems  Constitutional: Positive for malaise/fatigue and weight loss.  Gastrointestinal:       Positive for Polyphagia  All other systems reviewed and are negative.   PHYSICAL EXAM: Blood pressure 125/71, pulse 81, temperature 98.2 F (36.8 C), temperature source Oral, height 5\' 7"  (1.702 m), weight 281 lb (127.5 kg), last menstrual period 06/21/2018, SpO2 98 %. Body mass index is 44.01 kg/m. Physical Exam  Constitutional: She is oriented to person, place, and time. She appears well-developed and well-nourished.  HENT:  Head: Normocephalic.  Eyes: EOM are normal.  Neck: Normal range of motion.  Pulmonary/Chest: Effort normal.  Musculoskeletal: Normal range of motion.  Neurological: She is alert and oriented to person, place, and time.  Skin: Skin is warm and dry.  Psychiatric: She has a normal mood and affect. Her  behavior is normal.  Vitals reviewed.   RECENT LABS AND TESTS: BMET    Component Value Date/Time   NA 137 06/26/2018 1130   K 4.4 06/26/2018 1130   CL 101 06/26/2018 1130   CO2 23 06/26/2018 1130   GLUCOSE 83 06/26/2018 1130   GLUCOSE 104 (H) 05/02/2018 1216   BUN 10 06/26/2018 1130   CREATININE 0.83 06/26/2018 1130   CREATININE 0.98 08/19/2014 1223   CALCIUM 9.2 06/26/2018 1130   GFRNONAA 86 06/26/2018  Stacey Huerta 06/26/2018 1130   Lab Results  Component Value Date   HGBA1C 5.0 06/26/2018   HGBA1C 5.2 12/19/2015   HGBA1C 5.2 05/31/2008   Lab Results  Component Value Date   INSULIN 15.4 06/26/2018   CBC    Component Value Date/Time   WBC 4.4 06/26/2018 1130   WBC 4.1 05/02/2018 1216   RBC 4.38 06/26/2018 1130   RBC 4.23 05/02/2018 1216   HGB 13.6 06/26/2018 1130   HCT 41.1 06/26/2018 1130   PLT 182.0 05/02/2018 1216   MCV 94 06/26/2018 1130   MCH 31.1 06/26/2018 1130   MCH 30.0 08/19/2014 1226   MCH 32.4 03/20/2014 0812   MCHC 33.1 06/26/2018 1130   MCHC 33.6 05/02/2018 1216   RDW 12.0 (L) 06/26/2018 1130   LYMPHSABS 1.3 06/26/2018 1130   MONOABS 0.3 05/02/2018 1216   EOSABS 0.0 06/26/2018 1130   BASOSABS 0.0 06/26/2018 1130   Iron/TIBC/Ferritin/ %Sat No results found for: IRON, TIBC, FERRITIN, IRONPCTSAT Lipid Panel     Component Value Date/Time   CHOL 161 06/26/2018 1130   TRIG 74 06/26/2018 1130   HDL 43 06/26/2018 1130   CHOLHDL 4 12/19/2015 0852   VLDL 20.2 12/19/2015 0852   LDLCALC 103 (H) 06/26/2018 1130   Hepatic Function Panel     Component Value Date/Time   PROT 7.5 06/26/2018 1130   ALBUMIN 3.9 06/26/2018 1130   AST 19 06/26/2018 1130   ALT 18 06/26/2018 1130   ALKPHOS 98 06/26/2018 1130   BILITOT 0.9 06/26/2018 1130   BILIDIR 0.1 12/14/2014 1402   IBILI 0.9 04/30/2008 2117      Component Value Date/Time   TSH 2.420 06/26/2018 1130   TSH 1.59 05/02/2018 1216   TSH 1.34 12/14/2014 1402    ASSESSMENT AND PLAN: Vitamin D  deficiency - Plan: Vitamin D, Ergocalciferol, (DRISDOL) 50000 units CAPS capsule  Insulin resistance - Plan: metFORMIN (GLUCOPHAGE) 500 MG tablet  At risk for diabetes mellitus  Class 3 severe obesity with serious comorbidity and body mass index (BMI) of 40.0 to 44.9 in adult, unspecified obesity type ([place])  PLAN: Vitamin D Deficiency Stacey Huerta was informed that low vitamin D levels contributes to fatigue and are associated with obesity, breast, and colon cancer. She agrees to continue to take prescription Vit D @50 ,000 IU every week in which a prescription was written today for a 30 day supply and will follow up for routine testing of vitamin D, at least 2-3 times per year. She was informed of the risk of over-replacement of vitamin D and agrees to not increase her dose unless she discusses this with [place] first.  Insulin Resistance Stacey Huerta will continue to work on weight loss, exercise, and decreasing simple carbohydrates in her diet to help decrease the risk of diabetes. We dicussed metformin including benefits and risks. She was informed that eating too many simple carbohydrates or too many calories at one sitting increases the likelihood of GI side effects. Stacey Huerta requested metformin for now and prescription was written today. Stacey Huerta agreed to follow up with [place] as directed to monitor her progress. A prescription was written today for Metformin 500 mg Qam #30 no refills.   Diabetes risk counselling Stacey Huerta was given extended (15 minutes) diabetes prevention counseling today. She is 42 y.o. female and has risk factors for diabetes including obesity. We discussed intensive lifestyle modifications today with an emphasis on weight loss as well as increasing exercise and decreasing simple carbohydrates in her diet.  Obesity Stacey Huerta is currently in the action stage of change. As such, her goal is to continue with weight loss efforts She has agreed to follow the Category 3 plan Stacey Huerta has been  instructed to work up to a goal of 150 minutes of combined cardio and strengthening exercise per week for weight loss and overall health benefits. We discussed the following Behavioral Modification Stratagies today: increasing lean protein intake and decreasing simple carbohydrates and no skipping meals.    Meri has agreed to follow up with our clinic in 2 weeks. She was informed of the importance of frequent follow up visits to maximize her success with intensive lifestyle modifications for her multiple health conditions.   OBESITY BEHAVIORAL INTERVENTION VISIT  Today's visit was # 2   Starting weight: 290 lb Starting date: 06/26/18 Today's weight : Weight: 281 lb (127.5 kg)  Today's date: 07/15/18 Total lbs lost to date: 9    ASK: We discussed the diagnosis of obesity with Crystina Ava Pamella Huerta today and Stacey Huerta agreed to give [place] permission to discuss obesity behavioral modification therapy today.  ASSESS: Stacey Huerta has the diagnosis of obesity and her BMI today is @TBMI @ Stacey Huerta is in the action stage of change   ADVISE: Stacey Huerta was educated on the multiple health risks of obesity as well as the benefit of weight loss to improve her health. She was advised of the need for long term treatment and the importance of lifestyle modifications to improve her current health and to decrease her risk of future health problems.  AGREE: Multiple dietary modification options and treatment options were discussed and  Stacey Huerta agreed to follow the recommendations documented in the above note.  ARRANGE: Stacey Huerta was educated on the importance of frequent visits to treat obesity as outlined per CMS and USPSTF guidelines and agreed to schedule her next follow up appointment today.  I, April Moore, am acting as Location manager for Dr Dennard Nip.   I have reviewed the above documentation for accuracy and completeness, and I agree with the above. -Dennard Nip, MD

## 2018-07-18 ENCOUNTER — Ambulatory Visit (INDEPENDENT_AMBULATORY_CARE_PROVIDER_SITE_OTHER): Payer: BLUE CROSS/BLUE SHIELD

## 2018-07-18 DIAGNOSIS — E538 Deficiency of other specified B group vitamins: Secondary | ICD-10-CM | POA: Diagnosis not present

## 2018-07-18 MED ORDER — CYANOCOBALAMIN 1000 MCG/ML IJ SOLN
1000.0000 ug | Freq: Once | INTRAMUSCULAR | Status: AC
  Administered 2018-07-18: 1000 ug via INTRAMUSCULAR

## 2018-07-18 NOTE — Progress Notes (Signed)
Pre visit review using our clinic tool,if applicable. No additional management support is needed unless otherwise documented below in the visit note.   Pt here for monthly B12 injection per order from Dr. Roma Schanz.  B12 1037mcg given IM right deltoid, and pt tolerated injection well.  Next B12 injection scheduled for August 15 2018.

## 2018-07-30 ENCOUNTER — Ambulatory Visit (INDEPENDENT_AMBULATORY_CARE_PROVIDER_SITE_OTHER): Payer: BLUE CROSS/BLUE SHIELD | Admitting: Physician Assistant

## 2018-07-30 ENCOUNTER — Encounter (INDEPENDENT_AMBULATORY_CARE_PROVIDER_SITE_OTHER): Payer: Self-pay | Admitting: Physician Assistant

## 2018-07-30 VITALS — BP 95/65 | HR 86 | Temp 98.4°F | Ht 67.0 in | Wt 287.0 lb

## 2018-07-30 DIAGNOSIS — Z6841 Body Mass Index (BMI) 40.0 and over, adult: Secondary | ICD-10-CM

## 2018-07-30 DIAGNOSIS — E8881 Metabolic syndrome: Secondary | ICD-10-CM

## 2018-07-31 NOTE — Progress Notes (Signed)
Office: [phone]  /  Fax: [phone]   HPI:   Chief Complaint: OBESITY Stacey Huerta is here to discuss her progress with her obesity treatment plan. She is on the Category 3 plan and is following her eating plan approximately 95 % of the time. She states she is doing [employer] 60 minutes 4 times per week. [name] struggled with the plan the last few weeks. She reports that she does not do well eating in the evening, but has been making herself eat in order to get in all of the calories.  Her weight is 287 lb (130.2 kg) today and gained 6 pounds in 2 weeks since her last visit. She has lost 3 lbs since starting treatment with [place].  Insulin Resistance Stacey Huerta has a diagnosis of insulin resistance based on her elevated fasting insulin level >5. Although Stacey Huerta's blood glucose readings are still under good control, insulin resistance puts her at greater risk of metabolic syndrome and diabetes. She is taking metformin currently and continues to work on diet and exercise to decrease risk of diabetes. She denies polyphagia, nausea, vomiting, and diarrhea.  ALLERGIES: Allergies  Allergen Reactions  . Eggs Or Egg-Derived Products   . Sulfonamide Derivatives Hives    Also high fever  . Ganoderma Lucidum   . [place]   . Other     Mushrooms and [place] Nuts  . Penicillins     Childhood reaction  . Shellfish Allergy Nausea And Vomiting  . Fish-Derived Products Rash    MEDICATIONS: Current Outpatient Medications on File Prior to Visit  Medication Sig Dispense Refill  . aspirin EC 81 MG tablet Take 1 tablet (81 mg total) by mouth daily.    . B Complex-C-Folic Acid (B-COMPLEX BALANCED PO) Take by mouth.    . calcium carbonate 200 MG capsule Take 250 mg by mouth 2 (two) times daily with a meal.    . Cholecalciferol (VITAMIN D3) 50000 units CAPS Take 1 capsule by mouth daily.    . Cyanocobalamin (VITAMIN B-12) 1000 MCG SUBL Place under the tongue.    . Cyanocobalamin (VITAMIN B-12) 1000  MCG/15ML LIQD Take by mouth.    . estradiol (ESTRACE) 2 MG tablet Take 2 mg by mouth daily.    Marland Kitchen letrozole (FEMARA) 2.5 MG tablet Take 5 mg by mouth daily.    Marland Kitchen MAGNESIUM PO Take 1 tablet by mouth daily.    . metFORMIN (GLUCOPHAGE) 500 MG tablet Take 1 tablet (500 mg total) by mouth daily with breakfast. 30 tablet 0  . Nutritional Supplements (DHEA PO) Take 1 tablet by mouth daily.    . Omega-3 Fatty Acids (FISH OIL) 1000 MG CAPS Take by mouth.    . Prenatal Vit-Fe Fumarate-FA (PRENATAL VITAMIN PO) Take 1 tablet by mouth daily.    . Probiotic Product (PROBIOTIC-10 PO) Take by mouth.    . progesterone (PROMETRIUM) 200 MG capsule Take 200 mg by mouth 2 (two) times daily.    Marland Kitchen pyridOXINE (VITAMIN B-6) 50 MG tablet Take 50 mg by mouth daily.    . SELENIUM PO Take 1 tablet by mouth daily.    . Vitamin D, Ergocalciferol, (DRISDOL) 50000 units CAPS capsule Take 1 capsule (50,000 Units total) by mouth every 7 (seven) days. 4 capsule 0  . VITAMIN E PO Take 1 tablet by mouth daily.    Marland Kitchen zinc gluconate 50 MG tablet Take 50 mg by mouth daily.     Current Facility-Administered Medications on File Prior to Visit  Medication Dose  Route Frequency Provider Last Rate Last Dose  . cyanocobalamin ((VITAMIN B-12)) injection 1,000 mcg  1,000 mcg Intramuscular Once Ann Held, DO        PAST MEDICAL HISTORY: Past Medical History:  Diagnosis Date  . Allergy   . Back pain   . Dyspnea   . Fibroid   . Headache(784.0)   . Hypertension during pregnancy   . Infertility, female   . Leg edema   . Osteoarthritis   . Postpartum depression   . Sjogren's syndrome ([place]) 10/28/2015  . Vitamin B 12 deficiency   . Vitamin D deficiency     PAST SURGICAL HISTORY: Past Surgical History:  Procedure Laterality Date  . ABDOMINAL ADHESION SURGERY    . CESAREAN SECTION N/A 03/18/2014   Procedure: CESAREAN SECTION;  Surgeon: Delice Lesch, MD;  Location: [facility];  Service: Obstetrics;  Laterality: N/A;   spinal/epidural  . CHOLECYSTECTOMY    . ENDOMETRIAL ABLATION    . LYMPHADENECTOMY     Removed from the neck at age 18  . MYOMECTOMY    . MYOMECTOMY  05/23/2012   Procedure: MYOMECTOMY;  Surgeon: Governor Specking, MD;  Location: [facility];  Service: Gynecology;  Laterality: N/A;    SOCIAL HISTORY: Social History   Tobacco Use  . Smoking status: Never Smoker  . Smokeless tobacco: Never Used  Substance Use Topics  . Alcohol use: Yes    Alcohol/week: 0.0 standard drinks    Comment: socially  . Drug use: No    FAMILY HISTORY: Family History  Problem Relation Age of Onset  . Hypertension Paternal Grandfather   . Diabetes Paternal Grandfather   . Diabetes Father   . Hypotension Father   . Colon cancer Paternal Grandmother   . Diabetes Paternal Grandmother   . Prostate cancer Maternal Grandfather   . Hyperlipidemia Mother   . Obesity Mother   . Breast cancer Maternal Aunt   . Arthritis Paternal Aunt   . Hyperlipidemia Maternal Aunt        x's 2    ROS: Review of Systems  Constitutional: Negative for weight loss.  Gastrointestinal: Negative for diarrhea, nausea and vomiting.  Endo/Heme/Allergies:       Negative for polyphagia.    PHYSICAL EXAM: Blood pressure 95/65, pulse 86, temperature 98.4 F (36.9 C), temperature source Oral, height 5\' 7"  (1.702 m), weight 287 lb (130.2 kg), SpO2 100 %. Body mass index is 44.95 kg/m. Physical Exam  Constitutional: She is oriented to person, place, and time. She appears well-developed and well-nourished.  Cardiovascular: Normal rate.  Pulmonary/Chest: Effort normal.  Musculoskeletal: Normal range of motion.  Neurological: She is oriented to person, place, and time.  Skin: Skin is warm and dry.  Psychiatric: She has a normal mood and affect. Her behavior is normal.  Vitals reviewed.   RECENT LABS AND TESTS: BMET    Component Value Date/Time   NA 137 06/26/2018 1130   K 4.4 06/26/2018 1130   CL 101 06/26/2018 1130   CO2 23  06/26/2018 1130   GLUCOSE 83 06/26/2018 1130   GLUCOSE 104 (H) 05/02/2018 1216   BUN 10 06/26/2018 1130   CREATININE 0.83 06/26/2018 1130   CREATININE 0.98 08/19/2014 1223   CALCIUM 9.2 06/26/2018 1130   GFRNONAA 86 06/26/2018 1130   GFRAA 99 06/26/2018 1130   Lab Results  Component Value Date   HGBA1C 5.0 06/26/2018   HGBA1C 5.2 12/19/2015   HGBA1C 5.2 05/31/2008   Lab Results  Component  Value Date   INSULIN 15.4 06/26/2018   CBC    Component Value Date/Time   WBC 4.4 06/26/2018 1130   WBC 4.1 05/02/2018 1216   RBC 4.38 06/26/2018 1130   RBC 4.23 05/02/2018 1216   HGB 13.6 06/26/2018 1130   HCT 41.1 06/26/2018 1130   PLT 182.0 05/02/2018 1216   MCV 94 06/26/2018 1130   MCH 31.1 06/26/2018 1130   MCH 30.0 08/19/2014 1226   MCH 32.4 03/20/2014 0812   MCHC 33.1 06/26/2018 1130   MCHC 33.6 05/02/2018 1216   RDW 12.0 (L) 06/26/2018 1130   LYMPHSABS 1.3 06/26/2018 1130   MONOABS 0.3 05/02/2018 1216   EOSABS 0.0 06/26/2018 1130   BASOSABS 0.0 06/26/2018 1130   Iron/TIBC/Ferritin/ %Sat No results found for: IRON, TIBC, FERRITIN, IRONPCTSAT Lipid Panel     Component Value Date/Time   CHOL 161 06/26/2018 1130   TRIG 74 06/26/2018 1130   HDL 43 06/26/2018 1130   CHOLHDL 4 12/19/2015 0852   VLDL 20.2 12/19/2015 0852   LDLCALC 103 (H) 06/26/2018 1130   Hepatic Function Panel     Component Value Date/Time   PROT 7.5 06/26/2018 1130   ALBUMIN 3.9 06/26/2018 1130   AST 19 06/26/2018 1130   ALT 18 06/26/2018 1130   ALKPHOS 98 06/26/2018 1130   BILITOT 0.9 06/26/2018 1130   BILIDIR 0.1 12/14/2014 1402   IBILI 0.9 04/30/2008 2117      Component Value Date/Time   TSH 2.420 06/26/2018 1130   TSH 1.59 05/02/2018 1216   TSH 1.34 12/14/2014 1402   Results for SARRINAH, GARDIN (MRN [id number]) as of 07/31/2018 11:22  Ref. Range 06/26/2018 11:30  Vitamin D, 25-Hydroxy Latest Ref Range: 30.0 - 100.0 ng/mL 32.8    ASSESSMENT AND PLAN: Insulin resistance  Class 3  severe obesity with serious comorbidity and body mass index (BMI) of 45.0 to 49.9 in adult, unspecified obesity type ([place])  PLAN:  Insulin Resistance Boots will continue to work on weight loss, exercise, and decreasing simple carbohydrates in her diet to help decrease the risk of diabetes. She was informed that eating too many simple carbohydrates or too many calories at one sitting increases the likelihood of GI side effects. Yemaya agrees to continue metformin, diet and weight loss. Chantrice agreed to follow up with [place] as directed to monitor her progress.  I spent > than 50% of the 15 minute visit on counseling as documented in the note.  Obesity Mistee is currently in the action stage of change. As such, her goal is to continue with weight loss efforts. She has agreed to change to keeping a food journal with 1500 calories and 95 grams of protein daily. Sharnae has been instructed to work up to a goal of 150 minutes of combined cardio and strengthening exercise per week for weight loss and overall health benefits. We discussed the following Behavioral Modification Strategies today: work on meal planning and easy cooking plans and snacking choices.  Dacoda has agreed to follow up with our clinic in 2 weeks. She was informed of the importance of frequent follow up visits to maximize her success with intensive lifestyle modifications for her multiple health conditions.   OBESITY BEHAVIORAL INTERVENTION VISIT  Today's visit was # 3   Starting weight: 290 lbs Starting date: 06/26/18 Today's weight : Weight: 287 lb (130.2 kg)  Today's date: 07/30/2018 Total lbs lost to date: 3  ASK: We discussed the diagnosis of obesity with Venda Rodes today and  Retha agreed to give [place] permission to discuss obesity behavioral modification therapy today.  ASSESS: Chevelle has the diagnosis of obesity and her BMI today is 44.94. Natane is in the action stage of change.   ADVISE: Jentri was  educated on the multiple health risks of obesity as well as the benefit of weight loss to improve her health. She was advised of the need for long term treatment and the importance of lifestyle modifications to improve her current health and to decrease her risk of future health problems.  AGREE: Multiple dietary modification options and treatment options were discussed and [place] agreed to follow the recommendations documented in the above note.  ARRANGE: Eeva was educated on the importance of frequent visits to treat obesity as outlined per CMS and USPSTF guidelines and agreed to schedule her next follow up appointment today.  Lenward Chancellor, am acting as transcriptionist for Abby Potash, PA-C I, Abby Potash, PA-C have reviewed above note and agree with its content

## 2018-08-06 ENCOUNTER — Other Ambulatory Visit (INDEPENDENT_AMBULATORY_CARE_PROVIDER_SITE_OTHER): Payer: Self-pay | Admitting: Family Medicine

## 2018-08-06 DIAGNOSIS — E559 Vitamin D deficiency, unspecified: Secondary | ICD-10-CM

## 2018-08-06 DIAGNOSIS — E8881 Metabolic syndrome: Secondary | ICD-10-CM

## 2018-08-14 ENCOUNTER — Encounter (INDEPENDENT_AMBULATORY_CARE_PROVIDER_SITE_OTHER): Payer: Self-pay | Admitting: Physician Assistant

## 2018-08-14 ENCOUNTER — Other Ambulatory Visit (INDEPENDENT_AMBULATORY_CARE_PROVIDER_SITE_OTHER): Payer: Self-pay | Admitting: Physician Assistant

## 2018-08-14 ENCOUNTER — Ambulatory Visit (INDEPENDENT_AMBULATORY_CARE_PROVIDER_SITE_OTHER): Payer: BLUE CROSS/BLUE SHIELD | Admitting: Physician Assistant

## 2018-08-14 VITALS — BP 111/69 | HR 40 | Temp 98.1°F | Ht 67.0 in | Wt 282.0 lb

## 2018-08-14 DIAGNOSIS — Z9189 Other specified personal risk factors, not elsewhere classified: Secondary | ICD-10-CM | POA: Diagnosis not present

## 2018-08-14 DIAGNOSIS — E8881 Metabolic syndrome: Secondary | ICD-10-CM | POA: Diagnosis not present

## 2018-08-14 DIAGNOSIS — E559 Vitamin D deficiency, unspecified: Secondary | ICD-10-CM | POA: Diagnosis not present

## 2018-08-14 DIAGNOSIS — E88819 Insulin resistance, unspecified: Secondary | ICD-10-CM

## 2018-08-14 DIAGNOSIS — Z6841 Body Mass Index (BMI) 40.0 and over, adult: Secondary | ICD-10-CM

## 2018-08-14 MED ORDER — METFORMIN HCL 500 MG PO TABS: 500.0000 mg | ORAL_TABLET | Freq: Every day | ORAL | 0 refills | Status: DC

## 2018-08-14 MED ORDER — VITAMIN D (ERGOCALCIFEROL) 1.25 MG (50000 UNIT) PO CAPS: 50000.0000 [IU] | ORAL_CAPSULE | ORAL | 0 refills | Status: DC

## 2018-08-15 ENCOUNTER — Ambulatory Visit (INDEPENDENT_AMBULATORY_CARE_PROVIDER_SITE_OTHER): Payer: BLUE CROSS/BLUE SHIELD

## 2018-08-15 DIAGNOSIS — E538 Deficiency of other specified B group vitamins: Secondary | ICD-10-CM | POA: Diagnosis not present

## 2018-08-15 MED ORDER — CYANOCOBALAMIN 1000 MCG/ML IJ SOLN
1000.0000 ug | Freq: Once | INTRAMUSCULAR | Status: AC
  Administered 2018-08-15: 1000 ug via INTRAMUSCULAR

## 2018-08-15 NOTE — Progress Notes (Addendum)
Pre visit review using our clinic tool,if applicable. No additional management support is needed unless otherwise documented below in the visit note.   Pt here for monthly B12 injection per order from Dr. Carollee Herter.  B12 1066mcg given IM left deltoid, and pt tolerated injection well.  Next B12 injection scheduled for 1 month. Patient aware.   Ok with b12 injection for b12 deficiency  [employer], PA-C

## 2018-08-16 LAB — LEPTIN, SERUM: Leptin, Serum: 53.2 ng/mL

## 2018-08-18 NOTE — Progress Notes (Signed)
Office: [phone]  /  Fax: [phone]   HPI:   Chief Complaint: OBESITY Stacey Huerta is here to discuss her progress with her obesity treatment plan. She is on the keep a food journal with 1500 calories and 95 grams of protein daily and is following her eating plan approximately 95 % of the time. She states she is doing the orange theory for 60 minutes 4 times per week. Stacey Huerta did very well with weight loss. She followed the plan closely with the exception of when she was on a work trip and she attempted to follow as close as she could.  Her weight is 282 lb (127.9 kg) today and has had a weight loss of 5 pounds over a period of 2 weeks since her last visit. She has lost 8 lbs since starting treatment with [place].  Insulin Resistance Stacey Huerta has a diagnosis of insulin resistance based on her elevated fasting insulin level >5. Although Stacey Huerta's blood glucose readings are still under good control, insulin resistance puts her at greater risk of metabolic syndrome and diabetes. She is on metformin and denies nausea, vomiting, or diarrhea. She continues to work on diet and exercise to decrease risk of diabetes. She denies polyphagia or hypoglycemia.  Vitamin D Deficiency Stacey Huerta has a diagnosis of vitamin D deficiency. She is on prescription Vit D and denies nausea, vomiting or muscle weakness.  At risk for osteopenia and osteoporosis Stacey Huerta is at higher risk of osteopenia and osteoporosis due to vitamin D deficiency.   ALLERGIES: Allergies  Allergen Reactions  . Eggs Or Egg-Derived Products   . Sulfonamide Derivatives Hives    Also high fever  . Ganoderma Lucidum   . [place]   . Other     Mushrooms and [place] Nuts  . Penicillins     Childhood reaction  . Shellfish Allergy Nausea And Vomiting  . Fish-Derived Products Rash    MEDICATIONS: Current Outpatient Medications on File Prior to Visit  Medication Sig Dispense Refill  . aspirin EC 81 MG tablet Take 1 tablet (81 mg total) by  mouth daily.    . B Complex-C-Folic Acid (B-COMPLEX BALANCED PO) Take by mouth.    . calcium carbonate 200 MG capsule Take 250 mg by mouth 2 (two) times daily with a meal.    . Cholecalciferol (VITAMIN D3) 50000 units CAPS Take 1 capsule by mouth daily.    . Cyanocobalamin (VITAMIN B-12) 1000 MCG SUBL Place under the tongue.    . Cyanocobalamin (VITAMIN B-12) 1000 MCG/15ML LIQD Take by mouth.    . estradiol (ESTRACE) 2 MG tablet Take 2 mg by mouth daily.    Marland Kitchen letrozole (FEMARA) 2.5 MG tablet Take 5 mg by mouth daily.    Marland Kitchen MAGNESIUM PO Take 1 tablet by mouth daily.    . Nutritional Supplements (DHEA PO) Take 1 tablet by mouth daily.    . Omega-3 Fatty Acids (FISH OIL) 1000 MG CAPS Take by mouth.    . Prenatal Vit-Fe Fumarate-FA (PRENATAL VITAMIN PO) Take 1 tablet by mouth daily.    . Probiotic Product (PROBIOTIC-10 PO) Take by mouth.    . progesterone (PROMETRIUM) 200 MG capsule Take 200 mg by mouth 2 (two) times daily.    Marland Kitchen pyridOXINE (VITAMIN B-6) 50 MG tablet Take 50 mg by mouth daily.    . SELENIUM PO Take 1 tablet by mouth daily.    Marland Kitchen VITAMIN E PO Take 1 tablet by mouth daily.    Marland Kitchen zinc gluconate 50 MG tablet  Take 50 mg by mouth daily.     Current Facility-Administered Medications on File Prior to Visit  Medication Dose Route Frequency Provider Last Rate Last Dose  . cyanocobalamin ((VITAMIN B-12)) injection 1,000 mcg  1,000 mcg Intramuscular Once Stacey Huerta Held, DO        PAST MEDICAL HISTORY: Past Medical History:  Diagnosis Date  . Allergy   . Back pain   . Dyspnea   . Fibroid   . Headache(784.0)   . Hypertension during pregnancy   . Infertility, female   . Leg edema   . Osteoarthritis   . Postpartum depression   . Sjogren's syndrome ([place]) 10/28/2015  . Vitamin B 12 deficiency   . Vitamin D deficiency     PAST SURGICAL HISTORY: Past Surgical History:  Procedure Laterality Date  . ABDOMINAL ADHESION SURGERY    . CESAREAN SECTION N/A 03/18/2014   Procedure:  CESAREAN SECTION;  Surgeon: Delice Lesch, MD;  Location: [facility];  Service: Obstetrics;  Laterality: N/A;  spinal/epidural  . CHOLECYSTECTOMY    . ENDOMETRIAL ABLATION    . LYMPHADENECTOMY     Removed from the neck at age 64  . MYOMECTOMY    . MYOMECTOMY  05/23/2012   Procedure: MYOMECTOMY;  Surgeon: Governor Specking, MD;  Location: [facility];  Service: Gynecology;  Laterality: N/A;    SOCIAL HISTORY: Social History   Tobacco Use  . Smoking status: Never Smoker  . Smokeless tobacco: Never Used  Substance Use Topics  . Alcohol use: Yes    Alcohol/week: 0.0 standard drinks    Comment: socially  . Drug use: No    FAMILY HISTORY: Family History  Problem Relation Age of Onset  . Hypertension Paternal Grandfather   . Diabetes Paternal Grandfather   . Diabetes Father   . Hypotension Father   . Colon cancer Paternal Grandmother   . Diabetes Paternal Grandmother   . Prostate cancer Maternal Grandfather   . Hyperlipidemia Mother   . Obesity Mother   . Breast cancer Maternal Aunt   . Arthritis Paternal Aunt   . Hyperlipidemia Maternal Aunt        x's 2    ROS: Review of Systems  Constitutional: Positive for weight loss.  Gastrointestinal: Negative for diarrhea, nausea and vomiting.  Musculoskeletal:       Negative muscle weakness  Endo/Heme/Allergies:       Negative polyphagia    PHYSICAL EXAM: Blood pressure 111/69, pulse (!) 40, temperature 98.1 F (36.7 C), temperature source Oral, height 5\' 7"  (1.702 m), weight 282 lb (127.9 kg), SpO2 100 %. Body mass index is 44.17 kg/m. Physical Exam  Constitutional: She is oriented to person, place, and time. She appears well-developed and well-nourished.  Cardiovascular: Normal rate.  Pulmonary/Chest: Effort normal.  Musculoskeletal: Normal range of motion.  Neurological: She is oriented to person, place, and time.  Skin: Skin is warm and dry.  Psychiatric: She has a normal mood and affect. Her behavior is normal.  Vitals  reviewed.   RECENT LABS AND TESTS: BMET    Component Value Date/Time   NA 137 06/26/2018 1130   K 4.4 06/26/2018 1130   CL 101 06/26/2018 1130   CO2 23 06/26/2018 1130   GLUCOSE 83 06/26/2018 1130   GLUCOSE 104 (H) 05/02/2018 1216   BUN 10 06/26/2018 1130   CREATININE 0.83 06/26/2018 1130   CREATININE 0.98 08/19/2014 1223   CALCIUM 9.2 06/26/2018 1130   GFRNONAA 86 06/26/2018 1130   GFRAA  99 06/26/2018 1130   Lab Results  Component Value Date   HGBA1C 5.0 06/26/2018   HGBA1C 5.2 12/19/2015   HGBA1C 5.2 05/31/2008   Lab Results  Component Value Date   INSULIN 15.4 06/26/2018   CBC    Component Value Date/Time   WBC 4.4 06/26/2018 1130   WBC 4.1 05/02/2018 1216   RBC 4.38 06/26/2018 1130   RBC 4.23 05/02/2018 1216   HGB 13.6 06/26/2018 1130   HCT 41.1 06/26/2018 1130   PLT 182.0 05/02/2018 1216   MCV 94 06/26/2018 1130   MCH 31.1 06/26/2018 1130   MCH 30.0 08/19/2014 1226   MCH 32.4 03/20/2014 0812   MCHC 33.1 06/26/2018 1130   MCHC 33.6 05/02/2018 1216   RDW 12.0 (L) 06/26/2018 1130   LYMPHSABS 1.3 06/26/2018 1130   MONOABS 0.3 05/02/2018 1216   EOSABS 0.0 06/26/2018 1130   BASOSABS 0.0 06/26/2018 1130   Iron/TIBC/Ferritin/ %Sat No results found for: IRON, TIBC, FERRITIN, IRONPCTSAT Lipid Panel     Component Value Date/Time   CHOL 161 06/26/2018 1130   TRIG 74 06/26/2018 1130   HDL 43 06/26/2018 1130   CHOLHDL 4 12/19/2015 0852   VLDL 20.2 12/19/2015 0852   LDLCALC 103 (H) 06/26/2018 1130   Hepatic Function Panel     Component Value Date/Time   PROT 7.5 06/26/2018 1130   ALBUMIN 3.9 06/26/2018 1130   AST 19 06/26/2018 1130   ALT 18 06/26/2018 1130   ALKPHOS 98 06/26/2018 1130   BILITOT 0.9 06/26/2018 1130   BILIDIR 0.1 12/14/2014 1402   IBILI 0.9 04/30/2008 2117      Component Value Date/Time   TSH 2.420 06/26/2018 1130   TSH 1.59 05/02/2018 1216   TSH 1.34 12/14/2014 1402  Results for MAHREEN, SCHEWE AVA (MRN [id number]) as of  08/18/2018 10:30  Ref. Range 06/26/2018 11:30  Vitamin D, 25-Hydroxy Latest Ref Range: 30.0 - 100.0 ng/mL 32.8    ASSESSMENT AND PLAN: Insulin resistance - Plan: metFORMIN (GLUCOPHAGE) 500 MG tablet  Vitamin D deficiency - Plan: Vitamin D, Ergocalciferol, (DRISDOL) 50000 units CAPS capsule  At risk for osteoporosis  Class 3 severe obesity with serious comorbidity and body mass index (BMI) of 40.0 to 44.9 in adult, unspecified obesity type ([place])  PLAN:  Insulin Resistance Stacey Huerta will continue to work on weight loss, exercise, and decreasing simple carbohydrates in her diet to help decrease the risk of diabetes. We dicussed metformin including benefits and risks. She was informed that eating too many simple carbohydrates or too many calories at one sitting increases the likelihood of GI side effects. Stacey Huerta agrees to continue taking metformin 500 mg q AM #30 and we will refill for 1 month. Stacey Huerta agrees to follow up with our clinic in 2 weeks as directed to monitor her progress.  Vitamin D Deficiency Stacey Huerta was informed that low vitamin D levels contributes to fatigue and are associated with obesity, breast, and colon cancer. Stacey Huerta agrees to continue taking prescription Vit D @50 ,000 IU every week #4 and we will refill for 1 month. She will follow up for routine testing of vitamin D, at least 2-3 times per year. She was informed of the risk of over-replacement of vitamin D and agrees to not increase her dose unless she discusses this with [place] first. Stacey Huerta agrees to follow up with our clinic in 2 weeks.  At risk for osteopenia and osteoporosis Stacey Huerta was given extended (15 minutes) osteoporosis prevention counseling today. Stacey Huerta is at risk for osteopenia  and osteoporsis due to her vitamin D deficiency. She was encouraged to take her vitamin D and follow her higher calcium diet and increase strengthening exercise to help strengthen her bones and decrease her risk of osteopenia and  osteoporosis.  Obesity Stacey Huerta is currently in the action stage of change. As such, her goal is to continue with weight loss efforts She has agreed to keep a food journal with 1500 calories and 95 grams of protein daily Stacey Huerta has been instructed to work up to a goal of 150 minutes of combined cardio and strengthening exercise per week for weight loss and overall health benefits. We discussed the following Behavioral Modification Strategies today: work on meal planning and easy cooking plans, planning for success, and keep a strict food journal   Stacey Huerta has agreed to follow up with our clinic in 2 weeks. She was informed of the importance of frequent follow up visits to maximize her success with intensive lifestyle modifications for her multiple health conditions.   OBESITY BEHAVIORAL INTERVENTION VISIT  Today's visit was # 4  Starting weight: 290 lbs Starting date: 06/26/18 Today's weight : 282 lbs Today's date: 08/14/2018 Total lbs lost to date: 8    ASK: We discussed the diagnosis of obesity with Stacey Huerta today and Stacey Huerta agreed to give [place] permission to discuss obesity behavioral modification therapy today.  ASSESS: Stacey Huerta has the diagnosis of obesity and her BMI today is 44.16 Stacey Huerta is in the action stage of change   ADVISE: Stacey Huerta was educated on the multiple health risks of obesity as well as the benefit of weight loss to improve her health. She was advised of the need for long term treatment and the importance of lifestyle modifications.  AGREE: Multiple dietary modification options and treatment options were discussed and  Stacey Huerta agreed to the above obesity treatment plan.  Wilhemena Durie, am acting as transcriptionist for Abby Potash, PA-C I, Abby Potash, PA-C have reviewed above note and agree with its content

## 2018-08-28 ENCOUNTER — Encounter (INDEPENDENT_AMBULATORY_CARE_PROVIDER_SITE_OTHER): Payer: Self-pay | Admitting: Family Medicine

## 2018-08-28 ENCOUNTER — Ambulatory Visit (INDEPENDENT_AMBULATORY_CARE_PROVIDER_SITE_OTHER): Payer: BLUE CROSS/BLUE SHIELD | Admitting: Family Medicine

## 2018-08-28 VITALS — BP 100/68 | HR 62 | Temp 97.9°F | Ht 67.0 in | Wt 274.0 lb

## 2018-08-28 DIAGNOSIS — E8881 Metabolic syndrome: Secondary | ICD-10-CM

## 2018-08-28 DIAGNOSIS — E559 Vitamin D deficiency, unspecified: Secondary | ICD-10-CM

## 2018-08-28 DIAGNOSIS — Z6841 Body Mass Index (BMI) 40.0 and over, adult: Secondary | ICD-10-CM | POA: Diagnosis not present

## 2018-09-01 ENCOUNTER — Encounter (INDEPENDENT_AMBULATORY_CARE_PROVIDER_SITE_OTHER): Payer: Self-pay | Admitting: Family Medicine

## 2018-09-01 NOTE — Progress Notes (Signed)
Office: [phone]  /  Fax: [phone]   HPI:   Chief Complaint: OBESITY Stacey Huerta is here to discuss her progress with her obesity treatment plan. She is on the keep a food journal with 1500 calories and 95 grams of protein daily and is following her eating plan approximately 95% of the time. She states she is doing orange theory for 60 minutes 3 times per week. Stacey Huerta is meeting her protein goals. She does not eat after 3 pm, and she does not struggle with hunger after 3 pm. She plans to start in-vitro fertilization process in December.  Her weight is 274 lb (124.3 kg) today and has had a weight loss of 8 pounds over a period of 2 weeks since her last visit. She has lost 16 lbs since starting treatment with [place].  Insulin Resistance Stacey Huerta has a diagnosis of insulin resistance based on her elevated fasting insulin level >5. Although Stacey Huerta's blood glucose readings are still under good control, insulin resistance puts her at greater risk of metabolic syndrome and diabetes. She is taking metformin currently and denies nausea, vomiting, or diarrhea. She continues to work on diet and exercise to decrease risk of diabetes. She denies polyphagia.  Vitamin D Deficiency Stacey Huerta has a diagnosis of vitamin D deficiency. She is currently taking prescription Vit D, but level is not at goal. She denies nausea, vomiting or muscle weakness.  ALLERGIES: Allergies  Allergen Reactions  . Eggs Or Egg-Derived Products   . Sulfonamide Derivatives Hives    Also high fever  . Ganoderma Lucidum   . [place]   . Other     Mushrooms and [place] Nuts  . Penicillins     Childhood reaction  . Shellfish Allergy Nausea And Vomiting  . Fish-Derived Products Rash    MEDICATIONS: Current Outpatient Medications on File Prior to Visit  Medication Sig Dispense Refill  . aspirin EC 81 MG tablet Take 1 tablet (81 mg total) by mouth daily.    . B Complex-C-Folic Acid (B-COMPLEX BALANCED PO) Take by mouth.    .  calcium carbonate 200 MG capsule Take 250 mg by mouth 2 (two) times daily with a meal.    . Cyanocobalamin (VITAMIN B-12) 1000 MCG SUBL Place under the tongue.    . Cyanocobalamin (VITAMIN B-12) 1000 MCG/15ML LIQD Take by mouth.    . estradiol (ESTRACE) 2 MG tablet Take 2 mg by mouth daily.    Marland Kitchen letrozole (FEMARA) 2.5 MG tablet Take 5 mg by mouth daily.    Marland Kitchen MAGNESIUM PO Take 1 tablet by mouth daily.    . metFORMIN (GLUCOPHAGE) 500 MG tablet Take 1 tablet (500 mg total) by mouth daily with breakfast. 30 tablet 0  . Nutritional Supplements (DHEA PO) Take 1 tablet by mouth daily.    . Omega-3 Fatty Acids (FISH OIL) 1000 MG CAPS Take by mouth.    . Prenatal Vit-Fe Fumarate-FA (PRENATAL VITAMIN PO) Take 1 tablet by mouth daily.    . Probiotic Product (PROBIOTIC-10 PO) Take by mouth.    . progesterone (PROMETRIUM) 200 MG capsule Take 200 mg by mouth 2 (two) times daily.    Marland Kitchen pyridOXINE (VITAMIN B-6) 50 MG tablet Take 50 mg by mouth daily.    . SELENIUM PO Take 1 tablet by mouth daily.    . Vitamin D, Ergocalciferol, (DRISDOL) 50000 units CAPS capsule Take 1 capsule (50,000 Units total) by mouth every 7 (seven) days. 4 capsule 0  . VITAMIN E PO Take 1 tablet by  mouth daily.    Marland Kitchen zinc gluconate 50 MG tablet Take 50 mg by mouth daily.     Current Facility-Administered Medications on File Prior to Visit  Medication Dose Route Frequency Provider Last Rate Last Dose  . cyanocobalamin ((VITAMIN B-12)) injection 1,000 mcg  1,000 mcg Intramuscular Once Ann Held, DO        PAST MEDICAL HISTORY: Past Medical History:  Diagnosis Date  . Allergy   . Back pain   . Dyspnea   . Fibroid   . Headache(784.0)   . Hypertension during pregnancy   . Infertility, female   . Leg edema   . Osteoarthritis   . Postpartum depression   . Sjogren's syndrome ([place]) 10/28/2015  . Vitamin B 12 deficiency   . Vitamin D deficiency     PAST SURGICAL HISTORY: Past Surgical History:  Procedure Laterality  Date  . ABDOMINAL ADHESION SURGERY    . CESAREAN SECTION N/A 03/18/2014   Procedure: CESAREAN SECTION;  Surgeon: Delice Lesch, MD;  Location: [facility];  Service: Obstetrics;  Laterality: N/A;  spinal/epidural  . CHOLECYSTECTOMY    . ENDOMETRIAL ABLATION    . LYMPHADENECTOMY     Removed from the neck at age 55  . MYOMECTOMY    . MYOMECTOMY  05/23/2012   Procedure: MYOMECTOMY;  Surgeon: Governor Specking, MD;  Location: [facility];  Service: Gynecology;  Laterality: N/A;    SOCIAL HISTORY: Social History   Tobacco Use  . Smoking status: Never Smoker  . Smokeless tobacco: Never Used  Substance Use Topics  . Alcohol use: Yes    Alcohol/week: 0.0 standard drinks    Comment: socially  . Drug use: No    FAMILY HISTORY: Family History  Problem Relation Age of Onset  . Hypertension Paternal Grandfather   . Diabetes Paternal Grandfather   . Diabetes Father   . Hypotension Father   . Colon cancer Paternal Grandmother   . Diabetes Paternal Grandmother   . Prostate cancer Maternal Grandfather   . Hyperlipidemia Mother   . Obesity Mother   . Breast cancer Maternal Aunt   . Arthritis Paternal Aunt   . Hyperlipidemia Maternal Aunt        x's 2    ROS: Review of Systems  Constitutional: Positive for weight loss.  Gastrointestinal: Negative for diarrhea, nausea and vomiting.  Musculoskeletal:       Negative muscle weakness  Endo/Heme/Allergies:       Negative polyphagia    PHYSICAL EXAM: Blood pressure 100/68, pulse 62, temperature 97.9 F (36.6 C), temperature source Oral, height 5\' 7"  (1.702 m), weight 274 lb (124.3 kg), SpO2 98 %. Body mass index is 42.91 kg/m. Physical Exam  Constitutional: She is oriented to person, place, and time. She appears well-developed and well-nourished.  Cardiovascular: Normal rate.  Pulmonary/Chest: Effort normal.  Musculoskeletal: Normal range of motion.  Neurological: She is oriented to person, place, and time.  Skin: Skin is warm and dry.    Psychiatric: She has a normal mood and affect. Her behavior is normal.  Vitals reviewed.   RECENT LABS AND TESTS: BMET    Component Value Date/Time   NA 137 06/26/2018 1130   K 4.4 06/26/2018 1130   CL 101 06/26/2018 1130   CO2 23 06/26/2018 1130   GLUCOSE 83 06/26/2018 1130   GLUCOSE 104 (H) 05/02/2018 1216   BUN 10 06/26/2018 1130   CREATININE 0.83 06/26/2018 1130   CREATININE 0.98 08/19/2014 1223   CALCIUM 9.2  06/26/2018 1130   GFRNONAA 86 06/26/2018 1130   GFRAA 99 06/26/2018 1130   Lab Results  Component Value Date   HGBA1C 5.0 06/26/2018   HGBA1C 5.2 12/19/2015   HGBA1C 5.2 05/31/2008   Lab Results  Component Value Date   INSULIN 15.4 06/26/2018   CBC    Component Value Date/Time   WBC 4.4 06/26/2018 1130   WBC 4.1 05/02/2018 1216   RBC 4.38 06/26/2018 1130   RBC 4.23 05/02/2018 1216   HGB 13.6 06/26/2018 1130   HCT 41.1 06/26/2018 1130   PLT 182.0 05/02/2018 1216   MCV 94 06/26/2018 1130   MCH 31.1 06/26/2018 1130   MCH 30.0 08/19/2014 1226   MCH 32.4 03/20/2014 0812   MCHC 33.1 06/26/2018 1130   MCHC 33.6 05/02/2018 1216   RDW 12.0 (L) 06/26/2018 1130   LYMPHSABS 1.3 06/26/2018 1130   MONOABS 0.3 05/02/2018 1216   EOSABS 0.0 06/26/2018 1130   BASOSABS 0.0 06/26/2018 1130   Iron/TIBC/Ferritin/ %Sat No results found for: IRON, TIBC, FERRITIN, IRONPCTSAT Lipid Panel     Component Value Date/Time   CHOL 161 06/26/2018 1130   TRIG 74 06/26/2018 1130   HDL 43 06/26/2018 1130   CHOLHDL 4 12/19/2015 0852   VLDL 20.2 12/19/2015 0852   LDLCALC 103 (H) 06/26/2018 1130   Hepatic Function Panel     Component Value Date/Time   PROT 7.5 06/26/2018 1130   ALBUMIN 3.9 06/26/2018 1130   AST 19 06/26/2018 1130   ALT 18 06/26/2018 1130   ALKPHOS 98 06/26/2018 1130   BILITOT 0.9 06/26/2018 1130   BILIDIR 0.1 12/14/2014 1402   IBILI 0.9 04/30/2008 2117      Component Value Date/Time   TSH 2.420 06/26/2018 1130   TSH 1.59 05/02/2018 1216   TSH 1.34  12/14/2014 1402  Results for AMBRIE, CARTE (MRN [id number]) as of 09/01/2018 06:43  Ref. Range 06/26/2018 11:30  Vitamin D, 25-Hydroxy Latest Ref Range: 30.0 - 100.0 ng/mL 32.8    ASSESSMENT AND PLAN: Insulin resistance  Vitamin D deficiency  Class 3 severe obesity with serious comorbidity and body mass index (BMI) of 40.0 to 44.9 in adult, unspecified obesity type ([place])  PLAN:  Insulin Resistance Stacey Huerta will continue to work on weight loss, exercise, and decreasing simple carbohydrates in her diet to help decrease the risk of diabetes. We dicussed metformin including benefits and risks. She was informed that eating too many simple carbohydrates or too many calories at one sitting increases the likelihood of GI side effects. Stacey Huerta agrees to continue taking metformin, no refill needed and we will recheck insulin levels in 1 month. Stacey Huerta agrees to follow up with our clinic in 2 weeks as directed to monitor her progress.  Vitamin D Deficiency Stacey Huerta was informed that low vitamin D levels contributes to fatigue and are associated with obesity, breast, and colon cancer. Stacey Huerta agrees to continue taking prescription Vit D @50 ,000 IU every week, no refill needed. She will follow up for routine testing of vitamin D, at least 2-3 times per year. She was informed of the risk of over-replacement of vitamin D and agrees to not increase her dose unless she discusses this with [place] first. Stacey Huerta agrees to follow up with our clinic in 2 weeks.  I spent > than 50% of the 15 minute visit on counseling as documented in the note.  Obesity Stacey Huerta is currently in the action stage of change. As such, her goal is to continue with weight loss  efforts She has agreed to keep a food journal with 1500 calories and 95 grams of protein daily Stacey Huerta has been instructed to work up to a goal of 150 minutes of combined cardio and strengthening exercise per week or same as above for weight loss and overall  health benefits. We discussed the following Behavioral Modification Strategies today: work on meal planning and easy cooking plans and planning for success   Stacey Huerta has agreed to follow up with our clinic in 2 weeks. She was informed of the importance of frequent follow up visits to maximize her success with intensive lifestyle modifications for her multiple health conditions.   OBESITY BEHAVIORAL INTERVENTION VISIT  Today's visit was # 5  Starting weight: 290 lbs Starting date: 06/26/18 Today's weight : 274 lbs  Today's date: 08/28/2018 Total lbs lost to date: 32    ASK: We discussed the diagnosis of obesity with Stacey Huerta today and Stacey Huerta agreed to give [place] permission to discuss obesity behavioral modification therapy today.  ASSESS: Stacey Huerta has the diagnosis of obesity and her BMI today is 42.9 Stacey Huerta is in the action stage of change   ADVISE: Stacey Huerta was educated on the multiple health risks of obesity as well as the benefit of weight loss to improve her health. She was advised of the need for long term treatment and the importance of lifestyle modifications to improve her current health and to decrease her risk of future health problems.  AGREE: Multiple dietary modification options and treatment options were discussed and  Stacey Huerta agreed to follow the recommendations documented in the above note.  ARRANGE: Stacey Huerta was educated on the importance of frequent visits to treat obesity as outlined per CMS and USPSTF guidelines and agreed to schedule her next follow up appointment today.  Stacey Huerta, am acting as Location manager for [employer], FNP-C.  I have reviewed the above documentation for accuracy and completeness, and I agree with the above.  - Tameron Lama, FNP-C.

## 2018-09-11 ENCOUNTER — Encounter (INDEPENDENT_AMBULATORY_CARE_PROVIDER_SITE_OTHER): Payer: Self-pay | Admitting: Physician Assistant

## 2018-09-11 ENCOUNTER — Ambulatory Visit (INDEPENDENT_AMBULATORY_CARE_PROVIDER_SITE_OTHER): Payer: BLUE CROSS/BLUE SHIELD | Admitting: Physician Assistant

## 2018-09-11 VITALS — BP 110/68 | HR 88 | Temp 98.2°F | Ht 67.0 in | Wt 274.0 lb

## 2018-09-11 DIAGNOSIS — E8881 Metabolic syndrome: Secondary | ICD-10-CM | POA: Diagnosis not present

## 2018-09-11 DIAGNOSIS — Z6841 Body Mass Index (BMI) 40.0 and over, adult: Secondary | ICD-10-CM

## 2018-09-11 DIAGNOSIS — Z9189 Other specified personal risk factors, not elsewhere classified: Secondary | ICD-10-CM

## 2018-09-11 DIAGNOSIS — E559 Vitamin D deficiency, unspecified: Secondary | ICD-10-CM

## 2018-09-11 MED ORDER — VITAMIN D (ERGOCALCIFEROL) 1.25 MG (50000 UNIT) PO CAPS: 50000.0000 [IU] | ORAL_CAPSULE | ORAL | 0 refills | Status: DC

## 2018-09-11 MED ORDER — METFORMIN HCL 500 MG PO TABS: 500.0000 mg | ORAL_TABLET | Freq: Every day | ORAL | 0 refills | Status: DC

## 2018-09-12 ENCOUNTER — Encounter (INDEPENDENT_AMBULATORY_CARE_PROVIDER_SITE_OTHER): Payer: Self-pay | Admitting: Physician Assistant

## 2018-09-12 ENCOUNTER — Ambulatory Visit: Payer: BLUE CROSS/BLUE SHIELD

## 2018-09-15 ENCOUNTER — Other Ambulatory Visit (INDEPENDENT_AMBULATORY_CARE_PROVIDER_SITE_OTHER): Payer: Self-pay

## 2018-09-15 DIAGNOSIS — E8881 Metabolic syndrome: Secondary | ICD-10-CM

## 2018-09-15 MED ORDER — METFORMIN HCL 500 MG PO TABS: 500.0000 mg | ORAL_TABLET | Freq: Every day | ORAL | 0 refills | Status: DC

## 2018-09-15 NOTE — Progress Notes (Signed)
Office: [phone]  /  Fax: [phone]   HPI:   Chief Complaint: OBESITY Stacey Huerta is here to discuss her progress with her obesity treatment plan. She is on the keep a food journal with 1500 calories and 95 grams of protein daily and is following her eating plan approximately 100 % of the time. She states she is doing orange theory for 60 minutes 4 times per week. Stacey Huerta did well with weight maintenance. She reports that she started hormone therapy recently which caused weight gain.  Her weight is 274 lb (124.3 kg) today and has not lost weight since her last visit. She has lost 16 lbs since starting treatment with [place].  Vitamin D Deficiency Stacey Huerta has a diagnosis of vitamin D deficiency. She is currently taking prescription Vit D and denies nausea, vomiting or muscle weakness.  At risk for osteopenia and osteoporosis Stacey Huerta is at higher risk of osteopenia and osteoporosis due to vitamin D deficiency.   Insulin Resistance Stacey Huerta has a diagnosis of insulin resistance based on her elevated fasting insulin level >5. Although Stacey Huerta's blood glucose readings are still under good control, insulin resistance puts her at greater risk of metabolic syndrome and diabetes. She is on metformin and denies nausea, vomiting, or diarrhea. She continues to work on diet and exercise to decrease risk of diabetes. She denies polyphagia.  ALLERGIES: Allergies  Allergen Reactions  . Eggs Or Egg-Derived Products   . Sulfonamide Derivatives Hives    Also high fever  . Ganoderma Lucidum   . [place]   . Other     Mushrooms and [place] Nuts  . Penicillins     Childhood reaction  . Shellfish Allergy Nausea And Vomiting  . Fish-Derived Products Rash    MEDICATIONS: Current Outpatient Medications on File Prior to Visit  Medication Sig Dispense Refill  . aspirin EC 81 MG tablet Take 1 tablet (81 mg total) by mouth daily.    . B Complex-C-Folic Acid (B-COMPLEX BALANCED PO) Take by mouth.    .  calcium carbonate 200 MG capsule Take 250 mg by mouth 2 (two) times daily with a meal.    . Cyanocobalamin (VITAMIN B-12) 1000 MCG SUBL Place under the tongue.    . Cyanocobalamin (VITAMIN B-12) 1000 MCG/15ML LIQD Take by mouth.    . estradiol (ESTRACE) 2 MG tablet Take 2 mg by mouth daily.    Marland Kitchen letrozole (FEMARA) 2.5 MG tablet Take 5 mg by mouth daily.    Marland Kitchen MAGNESIUM PO Take 1 tablet by mouth daily.    . Nutritional Supplements (DHEA PO) Take 1 tablet by mouth daily.    . Omega-3 Fatty Acids (FISH OIL) 1000 MG CAPS Take by mouth.    . Prenatal Vit-Fe Fumarate-FA (PRENATAL VITAMIN PO) Take 1 tablet by mouth daily.    . Probiotic Product (PROBIOTIC-10 PO) Take by mouth.    . progesterone (PROMETRIUM) 200 MG capsule Take 200 mg by mouth 2 (two) times daily.    Marland Kitchen pyridOXINE (VITAMIN B-6) 50 MG tablet Take 50 mg by mouth daily.    . SELENIUM PO Take 1 tablet by mouth daily.    Marland Kitchen VITAMIN E PO Take 1 tablet by mouth daily.    Marland Kitchen zinc gluconate 50 MG tablet Take 50 mg by mouth daily.     Current Facility-Administered Medications on File Prior to Visit  Medication Dose Route Frequency Provider Last Rate Last Dose  . cyanocobalamin ((VITAMIN B-12)) injection 1,000 mcg  1,000 mcg Intramuscular Once [employer],  Alferd Apa, DO        PAST MEDICAL HISTORY: Past Medical History:  Diagnosis Date  . Allergy   . Back pain   . Dyspnea   . Fibroid   . Headache(784.0)   . Hypertension during pregnancy   . Infertility, female   . Leg edema   . Osteoarthritis   . Postpartum depression   . Sjogren's syndrome ([place]) 10/28/2015  . Vitamin B 12 deficiency   . Vitamin D deficiency     PAST SURGICAL HISTORY: Past Surgical History:  Procedure Laterality Date  . ABDOMINAL ADHESION SURGERY    . CESAREAN SECTION N/A 03/18/2014   Procedure: CESAREAN SECTION;  Surgeon: Delice Lesch, MD;  Location: [facility];  Service: Obstetrics;  Laterality: N/A;  spinal/epidural  . CHOLECYSTECTOMY    . ENDOMETRIAL ABLATION     . LYMPHADENECTOMY     Removed from the neck at age 23  . MYOMECTOMY    . MYOMECTOMY  05/23/2012   Procedure: MYOMECTOMY;  Surgeon: Governor Specking, MD;  Location: [facility];  Service: Gynecology;  Laterality: N/A;    SOCIAL HISTORY: Social History   Tobacco Use  . Smoking status: Never Smoker  . Smokeless tobacco: Never Used  Substance Use Topics  . Alcohol use: Yes    Alcohol/week: 0.0 standard drinks    Comment: socially  . Drug use: No    FAMILY HISTORY: Family History  Problem Relation Age of Onset  . Hypertension Paternal Grandfather   . Diabetes Paternal Grandfather   . Diabetes Father   . Hypotension Father   . Colon cancer Paternal Grandmother   . Diabetes Paternal Grandmother   . Prostate cancer Maternal Grandfather   . Hyperlipidemia Mother   . Obesity Mother   . Breast cancer Maternal Aunt   . Arthritis Paternal Aunt   . Hyperlipidemia Maternal Aunt        x's 2    ROS: Review of Systems  Constitutional: Negative for weight loss.  Gastrointestinal: Negative for diarrhea, nausea and vomiting.  Musculoskeletal:       Negative muscle weakness  Endo/Heme/Allergies:       Negative polyphagia    PHYSICAL EXAM: Blood pressure 110/68, pulse 88, temperature 98.2 F (36.8 C), temperature source Oral, height 5\' 7"  (1.702 m), weight 274 lb (124.3 kg), SpO2 99 %. Body mass index is 42.91 kg/m. Physical Exam  Constitutional: She is oriented to person, place, and time. She appears well-developed and well-nourished.  Cardiovascular: Normal rate.  Pulmonary/Chest: Effort normal.  Musculoskeletal: Normal range of motion.  Neurological: She is oriented to person, place, and time.  Skin: Skin is warm and dry.  Psychiatric: She has a normal mood and affect. Her behavior is normal.  Vitals reviewed.   RECENT LABS AND TESTS: BMET    Component Value Date/Time   NA 137 06/26/2018 1130   K 4.4 06/26/2018 1130   CL 101 06/26/2018 1130   CO2 23 06/26/2018 1130    GLUCOSE 83 06/26/2018 1130   GLUCOSE 104 (H) 05/02/2018 1216   BUN 10 06/26/2018 1130   CREATININE 0.83 06/26/2018 1130   CREATININE 0.98 08/19/2014 1223   CALCIUM 9.2 06/26/2018 1130   GFRNONAA 86 06/26/2018 1130   GFRAA 99 06/26/2018 1130   Lab Results  Component Value Date   HGBA1C 5.0 06/26/2018   HGBA1C 5.2 12/19/2015   HGBA1C 5.2 05/31/2008   Lab Results  Component Value Date   INSULIN 15.4 06/26/2018   CBC  Component Value Date/Time   WBC 4.4 06/26/2018 1130   WBC 4.1 05/02/2018 1216   RBC 4.38 06/26/2018 1130   RBC 4.23 05/02/2018 1216   HGB 13.6 06/26/2018 1130   HCT 41.1 06/26/2018 1130   PLT 182.0 05/02/2018 1216   MCV 94 06/26/2018 1130   MCH 31.1 06/26/2018 1130   MCH 30.0 08/19/2014 1226   MCH 32.4 03/20/2014 0812   MCHC 33.1 06/26/2018 1130   MCHC 33.6 05/02/2018 1216   RDW 12.0 (L) 06/26/2018 1130   LYMPHSABS 1.3 06/26/2018 1130   MONOABS 0.3 05/02/2018 1216   EOSABS 0.0 06/26/2018 1130   BASOSABS 0.0 06/26/2018 1130   Iron/TIBC/Ferritin/ %Sat No results found for: IRON, TIBC, FERRITIN, IRONPCTSAT Lipid Panel     Component Value Date/Time   CHOL 161 06/26/2018 1130   TRIG 74 06/26/2018 1130   HDL 43 06/26/2018 1130   CHOLHDL 4 12/19/2015 0852   VLDL 20.2 12/19/2015 0852   LDLCALC 103 (H) 06/26/2018 1130   Hepatic Function Panel     Component Value Date/Time   PROT 7.5 06/26/2018 1130   ALBUMIN 3.9 06/26/2018 1130   AST 19 06/26/2018 1130   ALT 18 06/26/2018 1130   ALKPHOS 98 06/26/2018 1130   BILITOT 0.9 06/26/2018 1130   BILIDIR 0.1 12/14/2014 1402   IBILI 0.9 04/30/2008 2117      Component Value Date/Time   TSH 2.420 06/26/2018 1130   TSH 1.59 05/02/2018 1216   TSH 1.34 12/14/2014 1402  Results for CLIMMIE, CRONCE AVA (MRN [id number]) as of 09/15/2018 17:00  Ref. Range 06/26/2018 11:30  Vitamin D, 25-Hydroxy Latest Ref Range: 30.0 - 100.0 ng/mL 32.8    ASSESSMENT AND PLAN: Vitamin D deficiency - Plan: Vitamin D,  Ergocalciferol, (DRISDOL) 1.25 MG (50000 UT) CAPS capsule  Insulin resistance - Plan: DISCONTINUED: metFORMIN (GLUCOPHAGE) 500 MG tablet  At risk for osteoporosis  Class 3 severe obesity with serious comorbidity and body mass index (BMI) of 40.0 to 44.9 in adult, unspecified obesity type (HCC)  PLAN:  Vitamin D Deficiency Stacey Huerta was informed that low vitamin D levels contributes to fatigue and are associated with obesity, breast, and colon cancer. Stacey Huerta agrees to continue taking prescription Vit D @50 ,000 IU every week #4 and we will refill for 1 month. She will follow up for routine testing of vitamin D, at least 2-3 times per year. She was informed of the risk of over-replacement of vitamin D and agrees to not increase her dose unless she discusses this with [place] first. Stacey Huerta agrees to follow up with our clinic in 3 weeks.  At risk for osteopenia and osteoporosis Stacey Huerta was given extended (15 minutes) osteoporosis prevention counseling today. Stacey Huerta is at risk for osteopenia and osteoporsis due to her vitamin D deficiency. She was encouraged to take her vitamin D and follow her higher calcium diet and increase strengthening exercise to help strengthen her bones and decrease her risk of osteopenia and osteoporosis.  Insulin Resistance Stacey Huerta will continue to work on weight loss, diet, exercise, and decreasing simple carbohydrates in her diet to help decrease the risk of diabetes. We dicussed metformin including benefits and risks. She was informed that eating too many simple carbohydrates or too many calories at one sitting increases the likelihood of GI side effects. Stacey Huerta agrees to continue taking metformin 500 mg q AM #30 and we will refill for 1 month. Stacey Huerta agrees to follow up with our clinic in 3 weeks as directed to monitor her progress.  Obesity  Stacey Huerta is currently in the action stage of change. As such, her goal is to continue with weight loss efforts She has agreed to  increase to keep a food journal with 1600 calories and 100 grams of protein daily Stacey Huerta has been instructed to work up to a goal of 150 minutes of combined cardio and strengthening exercise per week for weight loss and overall health benefits. We discussed the following Behavioral Modification Strategies today: work on meal planning and easy cooking plans and holiday eating strategies    Stacey Huerta has agreed to follow up with our clinic in 3 weeks. She was informed of the importance of frequent follow up visits to maximize her success with intensive lifestyle modifications for her multiple health conditions.   OBESITY BEHAVIORAL INTERVENTION VISIT  Today's visit was # 6   Starting weight: 290 lbs Starting date: 06/26/18 Today's weight : 274 lbs  Today's date: 09/11/2018 Total lbs lost to date: 69    ASK: We discussed the diagnosis of obesity with Stacey Huerta today and Stacey Huerta agreed to give [place] permission to discuss obesity behavioral modification therapy today.  ASSESS: Stacey Huerta has the diagnosis of obesity and her BMI today is 42.9 Alexyia is in the action stage of change   ADVISE: Jolea was educated on the multiple health risks of obesity as well as the benefit of weight loss to improve her health. She was advised of the need for long term treatment and the importance of lifestyle modifications.  AGREE: Multiple dietary modification options and treatment options were discussed and  Sherise agreed to the above obesity treatment plan.  Wilhemena Durie, am acting as transcriptionist for Abby Potash, PA-C I, Abby Potash, PA-C have reviewed above note and agree with its content

## 2018-10-02 ENCOUNTER — Ambulatory Visit (INDEPENDENT_AMBULATORY_CARE_PROVIDER_SITE_OTHER): Payer: BLUE CROSS/BLUE SHIELD | Admitting: Physician Assistant

## 2018-10-02 ENCOUNTER — Encounter (INDEPENDENT_AMBULATORY_CARE_PROVIDER_SITE_OTHER): Payer: Self-pay

## 2018-10-07 ENCOUNTER — Ambulatory Visit (INDEPENDENT_AMBULATORY_CARE_PROVIDER_SITE_OTHER): Payer: BLUE CROSS/BLUE SHIELD | Admitting: Family Medicine

## 2018-10-07 ENCOUNTER — Encounter (INDEPENDENT_AMBULATORY_CARE_PROVIDER_SITE_OTHER): Payer: Self-pay | Admitting: Family Medicine

## 2018-10-07 VITALS — BP 118/77 | HR 91 | Temp 97.5°F | Ht 67.0 in | Wt 276.0 lb

## 2018-10-07 DIAGNOSIS — E8881 Metabolic syndrome: Secondary | ICD-10-CM

## 2018-10-07 DIAGNOSIS — Z9189 Other specified personal risk factors, not elsewhere classified: Secondary | ICD-10-CM | POA: Diagnosis not present

## 2018-10-07 DIAGNOSIS — E559 Vitamin D deficiency, unspecified: Secondary | ICD-10-CM | POA: Diagnosis not present

## 2018-10-07 DIAGNOSIS — Z6841 Body Mass Index (BMI) 40.0 and over, adult: Secondary | ICD-10-CM

## 2018-10-07 DIAGNOSIS — E7849 Other hyperlipidemia: Secondary | ICD-10-CM

## 2018-10-07 MED ORDER — VITAMIN D (ERGOCALCIFEROL) 1.25 MG (50000 UNIT) PO CAPS: 50000.0000 [IU] | ORAL_CAPSULE | ORAL | 0 refills | Status: DC

## 2018-10-08 ENCOUNTER — Encounter (INDEPENDENT_AMBULATORY_CARE_PROVIDER_SITE_OTHER): Payer: Self-pay | Admitting: Family Medicine

## 2018-10-08 DIAGNOSIS — E88819 Insulin resistance, unspecified: Secondary | ICD-10-CM | POA: Insufficient documentation

## 2018-10-08 DIAGNOSIS — E8881 Metabolic syndrome: Secondary | ICD-10-CM | POA: Insufficient documentation

## 2018-10-08 DIAGNOSIS — E559 Vitamin D deficiency, unspecified: Secondary | ICD-10-CM | POA: Insufficient documentation

## 2018-10-08 DIAGNOSIS — E7849 Other hyperlipidemia: Secondary | ICD-10-CM | POA: Insufficient documentation

## 2018-10-08 HISTORY — DX: Insulin resistance, unspecified: E88.819

## 2018-10-08 HISTORY — DX: Other hyperlipidemia: E78.49

## 2018-10-08 LAB — COMPREHENSIVE METABOLIC PANEL
ALT: 20 IU/L (ref 0–32)
AST: 24 IU/L (ref 0–40)
Albumin/Globulin Ratio: 1.2 (ref 1.2–2.2)
Albumin: 4.1 g/dL (ref 3.5–5.5)
Alkaline Phosphatase: 69 IU/L (ref 39–117)
BUN/Creatinine Ratio: 11 (ref 9–23)
BUN: 10 mg/dL (ref 6–24)
Bilirubin Total: 1.1 mg/dL (ref 0.0–1.2)
CO2: 22 mmol/L (ref 20–29)
Calcium: 9.3 mg/dL (ref 8.7–10.2)
Chloride: 101 mmol/L (ref 96–106)
Creatinine, Ser: 0.89 mg/dL (ref 0.57–1.00)
GFR calc Af Amer: 91 mL/min/{1.73_m2} (ref >59)
GFR calc non Af Amer: 79 mL/min/{1.73_m2} (ref >59)
Globulin, Total: 3.3 g/dL (ref 1.5–4.5)
Glucose: 71 mg/dL (ref 65–99)
Potassium: 4 mmol/L (ref 3.5–5.2)
Sodium: 136 mmol/L (ref 134–144)
Total Protein: 7.4 g/dL (ref 6.0–8.5)

## 2018-10-08 LAB — LIPID PANEL WITH LDL/HDL RATIO
Cholesterol, Total: 168 mg/dL (ref 100–199)
HDL: 50 mg/dL (ref >39)
LDL Calculated: 97 mg/dL (ref 0–99)
LDl/HDL Ratio: 1.9 ratio (ref 0.0–3.2)
Triglycerides: 106 mg/dL (ref 0–149)
VLDL Cholesterol Cal: 21 mg/dL (ref 5–40)

## 2018-10-08 LAB — HEMOGLOBIN A1C
Est. average glucose Bld gHb Est-mCnc: 94 mg/dL
Hgb A1c MFr Bld: 4.9 % (ref 4.8–5.6)

## 2018-10-08 LAB — INSULIN, RANDOM: INSULIN: 10.5 u[IU]/mL (ref 2.6–24.9)

## 2018-10-08 LAB — VITAMIN D 25 HYDROXY (VIT D DEFICIENCY, FRACTURES): Vit D, 25-Hydroxy: 40.9 ng/mL (ref 30.0–100.0)

## 2018-10-08 NOTE — Progress Notes (Signed)
Office: [phone]  /  Fax: [phone]   HPI:   Chief Complaint: OBESITY Stacey Huerta is here to discuss her progress with her obesity treatment plan. She is on the keep a food journal with 1600 calories and 100 grams of protein daily and is following her eating plan approximately 95% of the time. She states she is doing orange theory for 60 minutes 4 times per week. She is reaching her protein goal. Her weight is 276 lb (125.2 kg) today and has gained 2 pounds since her last visit. She has lost 14 lbs since starting treatment with [place].  Vitamin D Deficiency Stacey Huerta has a diagnosis of vitamin D deficiency. She is currently taking prescription Vit D, but level is not at goal. Last Vit D 32.8 on 06/26/18. She denies nausea, vomiting or muscle weakness.  At risk for osteopenia and osteoporosis Stacey Huerta is at higher risk of osteopenia and osteoporosis due to vitamin D deficiency.   Insulin Resistance Stacey Huerta has a diagnosis of insulin resistance based on her elevated fasting insulin level >5. Although Stacey Huerta's blood glucose readings are still under good control, insulin resistance puts her at greater risk of metabolic syndrome and diabetes. She is on metformin and denies polyphagia. She continues to work on diet and exercise to decrease risk of diabetes.  Hyperlipidemia Stacey Huerta has hyperlipidemia and has been trying to improve her cholesterol levels with intensive lifestyle modification including a low saturated fat diet, exercise and weight loss. She is not on statin, last LDL was 103, triglycerides normal, and HDL is low. She denies any chest pain, claudication or myalgias.  ALLERGIES: Allergies  Allergen Reactions  . Eggs Or Egg-Derived Products   . Sulfonamide Derivatives Hives    Also high fever  . Ganoderma Lucidum   . [place]   . Other     Mushrooms and [place] Nuts  . Penicillins     Childhood reaction  . Shellfish Allergy Nausea And Vomiting  . Fish-Derived Products Rash     MEDICATIONS: Current Outpatient Medications on File Prior to Visit  Medication Sig Dispense Refill  . aspirin EC 81 MG tablet Take 1 tablet (81 mg total) by mouth daily.    . B Complex-C-Folic Acid (B-COMPLEX BALANCED PO) Take by mouth.    . calcium carbonate 200 MG capsule Take 250 mg by mouth 2 (two) times daily with a meal.    . Cyanocobalamin (VITAMIN B-12) 1000 MCG SUBL Place under the tongue.    . Cyanocobalamin (VITAMIN B-12) 1000 MCG/15ML LIQD Take by mouth.    . estradiol (ESTRACE) 2 MG tablet Take 2 mg by mouth daily.    Marland Kitchen letrozole (FEMARA) 2.5 MG tablet Take 5 mg by mouth daily.    Marland Kitchen MAGNESIUM PO Take 1 tablet by mouth daily.    . metFORMIN (GLUCOPHAGE) 500 MG tablet Take 1 tablet (500 mg total) by mouth daily with breakfast. 90 tablet 0  . Nutritional Supplements (DHEA PO) Take 1 tablet by mouth daily.    . Omega-3 Fatty Acids (FISH OIL) 1000 MG CAPS Take by mouth.    . Prenatal Vit-Fe Fumarate-FA (PRENATAL VITAMIN PO) Take 1 tablet by mouth daily.    . Probiotic Product (PROBIOTIC-10 PO) Take by mouth.    . progesterone (PROMETRIUM) 200 MG capsule Take 200 mg by mouth 2 (two) times daily.    Marland Kitchen pyridOXINE (VITAMIN B-6) 50 MG tablet Take 50 mg by mouth daily.    . SELENIUM PO Take 1 tablet by mouth daily.    Marland Kitchen  VITAMIN E PO Take 1 tablet by mouth daily.    Marland Kitchen zinc gluconate 50 MG tablet Take 50 mg by mouth daily.     Current Facility-Administered Medications on File Prior to Visit  Medication Dose Route Frequency Provider Last Rate Last Dose  . cyanocobalamin ((VITAMIN B-12)) injection 1,000 mcg  1,000 mcg Intramuscular Once Ann Held, DO        PAST MEDICAL HISTORY: Past Medical History:  Diagnosis Date  . Allergy   . Back pain   . Dyspnea   . Fibroid   . Headache(784.0)   . Hypertension during pregnancy   . Infertility, female   . Leg edema   . Osteoarthritis   . Postpartum depression   . Sjogren's syndrome ([place]) 10/28/2015  . Vitamin B 12  deficiency   . Vitamin D deficiency     PAST SURGICAL HISTORY: Past Surgical History:  Procedure Laterality Date  . ABDOMINAL ADHESION SURGERY    . CESAREAN SECTION N/A 03/18/2014   Procedure: CESAREAN SECTION;  Surgeon: Delice Lesch, MD;  Location: [facility];  Service: Obstetrics;  Laterality: N/A;  spinal/epidural  . CHOLECYSTECTOMY    . ENDOMETRIAL ABLATION    . LYMPHADENECTOMY     Removed from the neck at age 58  . MYOMECTOMY    . MYOMECTOMY  05/23/2012   Procedure: MYOMECTOMY;  Surgeon: Governor Specking, MD;  Location: [facility];  Service: Gynecology;  Laterality: N/A;    SOCIAL HISTORY: Social History   Tobacco Use  . Smoking status: Never Smoker  . Smokeless tobacco: Never Used  Substance Use Topics  . Alcohol use: Yes    Alcohol/week: 0.0 standard drinks    Comment: socially  . Drug use: No    FAMILY HISTORY: Family History  Problem Relation Age of Onset  . Hypertension Paternal Grandfather   . Diabetes Paternal Grandfather   . Diabetes Father   . Hypotension Father   . Colon cancer Paternal Grandmother   . Diabetes Paternal Grandmother   . Prostate cancer Maternal Grandfather   . Hyperlipidemia Mother   . Obesity Mother   . Breast cancer Maternal Aunt   . Arthritis Paternal Aunt   . Hyperlipidemia Maternal Aunt        x's 2    ROS: Review of Systems  Constitutional: Negative for weight loss.  Cardiovascular: Negative for chest pain and claudication.  Gastrointestinal: Negative for nausea and vomiting.  Musculoskeletal: Negative for myalgias.       Negative muscle weakness  Endo/Heme/Allergies:       Negative polyphagia    PHYSICAL EXAM: Blood pressure 118/77, pulse 91, temperature (!) 97.5 F (36.4 C), temperature source Oral, height 5\' 7"  (1.702 m), weight 276 lb (125.2 kg), SpO2 98 %. Body mass index is 43.23 kg/m. Physical Exam Vitals signs reviewed.  Constitutional:      Appearance: Normal appearance. She is obese.  Cardiovascular:      Rate and Rhythm: Normal rate.  Pulmonary:     Effort: Pulmonary effort is normal.  Musculoskeletal: Normal range of motion.  Skin:    General: Skin is warm and dry.  Neurological:     Mental Status: She is alert and oriented to person, place, and time.  Psychiatric:        Mood and Affect: Mood normal.        Behavior: Behavior normal.     RECENT LABS AND TESTS: BMET    Component Value Date/Time   NA 136 10/07/2018  1149   K 4.0 10/07/2018 1149   CL 101 10/07/2018 1149   CO2 22 10/07/2018 1149   GLUCOSE 71 10/07/2018 1149   GLUCOSE 104 (H) 05/02/2018 1216   BUN 10 10/07/2018 1149   CREATININE 0.89 10/07/2018 1149   CREATININE 0.98 08/19/2014 1223   CALCIUM 9.3 10/07/2018 1149   GFRNONAA 79 10/07/2018 1149   GFRAA 91 10/07/2018 1149   Lab Results  Component Value Date   HGBA1C 4.9 10/07/2018   HGBA1C 5.0 06/26/2018   HGBA1C 5.2 12/19/2015   HGBA1C 5.2 05/31/2008   Lab Results  Component Value Date   INSULIN 10.5 10/07/2018   INSULIN 15.4 06/26/2018   CBC    Component Value Date/Time   WBC 4.4 06/26/2018 1130   WBC 4.1 05/02/2018 1216   RBC 4.38 06/26/2018 1130   RBC 4.23 05/02/2018 1216   HGB 13.6 06/26/2018 1130   HCT 41.1 06/26/2018 1130   PLT 182.0 05/02/2018 1216   MCV 94 06/26/2018 1130   MCH 31.1 06/26/2018 1130   MCH 30.0 08/19/2014 1226   MCH 32.4 03/20/2014 0812   MCHC 33.1 06/26/2018 1130   MCHC 33.6 05/02/2018 1216   RDW 12.0 (L) 06/26/2018 1130   LYMPHSABS 1.3 06/26/2018 1130   MONOABS 0.3 05/02/2018 1216   EOSABS 0.0 06/26/2018 1130   BASOSABS 0.0 06/26/2018 1130   Iron/TIBC/Ferritin/ %Sat No results found for: IRON, TIBC, FERRITIN, IRONPCTSAT Lipid Panel     Component Value Date/Time   CHOL 168 10/07/2018 1149   TRIG 106 10/07/2018 1149   HDL 50 10/07/2018 1149   CHOLHDL 4 12/19/2015 0852   VLDL 20.2 12/19/2015 0852   LDLCALC 97 10/07/2018 1149   Hepatic Function Panel     Component Value Date/Time   PROT 7.4 10/07/2018 1149    ALBUMIN 4.1 10/07/2018 1149   AST 24 10/07/2018 1149   ALT 20 10/07/2018 1149   ALKPHOS 69 10/07/2018 1149   BILITOT 1.1 10/07/2018 1149   BILIDIR 0.1 12/14/2014 1402   IBILI 0.9 04/30/2008 2117      Component Value Date/Time   TSH 2.420 06/26/2018 1130   TSH 1.59 05/02/2018 1216   TSH 1.34 12/14/2014 1402  Results for AAIMA, GADDIE Stacey Huerta (MRN [id number]) as of 10/08/2018 10:29  Ref. Range 06/26/2018 11:30  Vitamin D, 25-Hydroxy Latest Ref Range: 30.0 - 100.0 ng/mL 32.8    ASSESSMENT AND PLAN: Vitamin D deficiency - Plan: VITAMIN D 25 Hydroxy (Vit-D Deficiency, Fractures), Vitamin D, Ergocalciferol, (DRISDOL) 1.25 MG (50000 UT) CAPS capsule  Insulin resistance - Plan: Comprehensive metabolic panel, Hemoglobin A1c, Insulin, random  Other hyperlipidemia - Plan: Lipid Panel With LDL/HDL Ratio  At risk for osteoporosis  Class 3 severe obesity with serious comorbidity and body mass index (BMI) of 40.0 to 44.9 in adult, unspecified obesity type (HCC)  PLAN:  Vitamin D Deficiency Stacey Huerta was informed that low vitamin D levels contributes to fatigue and are associated with obesity, breast, and colon cancer. She agrees to continue to take prescription Vit D @50 ,000 IU every week and will follow up for routine testing of vitamin D, at least 2-3 times per year. She was informed of the risk of over-replacement of vitamin D and agrees to not increase her dose unless she discusses this with [place] first. We will check Vit D level  Today. Tyauna agrees to follow up with our clinic in 3 weeks.  At risk for osteopenia and osteoporosis Stacey Huerta was given extended (15 minutes) osteoporosis prevention counseling today. Stacey Huerta  is at risk for osteopenia and osteoporsis due to her vitamin D deficiency. She was encouraged to take her vitamin D and follow her higher calcium diet and increase strengthening exercise to help strengthen her bones and decrease her risk of osteopenia and osteoporosis.  Insulin  Resistance Stacey Huerta will continue to work on weight loss, exercise, and decreasing simple carbohydrates in her diet to help decrease the risk of diabetes. We dicussed metformin including benefits and risks. She was informed that eating too many simple carbohydrates or too many calories at one sitting increases the likelihood of GI side effects. Stacey Huerta agrees to continue taking metformin, and we will check A1c, fasting glucose, and insulin today. Stacey Huerta agrees to follow up with our clinic in 3 weeks as directed to monitor her progress.  Hyperlipidemia Stacey Huerta was informed of the American Heart Association Guidelines emphasizing intensive lifestyle modifications as the first line treatment for hyperlipidemia. We discussed many lifestyle modifications today in depth, and Stacey Huerta will continue to work on decreasing saturated fats such as fatty red meat, butter and many fried foods. She will continue meal plan, and will also increase vegetables and lean protein in her diet and continue to work on exercise and weight loss efforts. We will check FLP today. Stacey Huerta agrees to follow up with our clinic in 3 weeks.  Obesity Stacey Huerta is currently in the action stage of change. As such, her goal is to continue with weight loss efforts She has agreed to keep a food journal with 1600 calories and 100 grams of protein daily Stacey Huerta will continue current exercise regimen for weight loss and overall health benefits. We discussed the following Behavioral Modification Strategies today: planning for success and keeping a strict food journal   Stacey Huerta has agreed to follow up with our clinic in 3 weeks. She was informed of the importance of frequent follow up visits to maximize her success with intensive lifestyle modifications for her multiple health conditions.   OBESITY BEHAVIORAL INTERVENTION VISIT  Today's visit was # 7  Starting weight: 290 lbs Starting date: 06/26/18 Today's weight : 276 lbs  Today's date:  10/07/2018 Total lbs lost to date: 76    ASK: We discussed the diagnosis of obesity with Stacey Huerta Stacey Huerta Stacey Huerta Stacey Huerta today and Stacey Huerta agreed to give [place] permission to discuss obesity behavioral modification therapy today.  ASSESS: Milda has the diagnosis of obesity and her BMI today is 43.22 Yaa is in the action stage of change   ADVISE: Stacey Huerta was educated on the multiple health risks of obesity as well as the benefit of weight loss to improve her health. She was advised of the need for long term treatment and the importance of lifestyle modifications to improve her current health and to decrease her risk of future health problems.  AGREE: Multiple dietary modification options and treatment options were discussed and  Stacey Huerta agreed to follow the recommendations documented in the above note.  ARRANGE: Mende was educated on the importance of frequent visits to treat obesity as outlined per CMS and USPSTF guidelines and agreed to schedule her next follow up appointment today.  Wilhemena Durie, am acting as Location manager for [employer], FNP-C.  I have reviewed the above documentation for accuracy and completeness, and I agree with the above.  - Ellisha Bankson, FNP-C.

## 2018-10-10 ENCOUNTER — Ambulatory Visit (INDEPENDENT_AMBULATORY_CARE_PROVIDER_SITE_OTHER): Payer: BLUE CROSS/BLUE SHIELD

## 2018-10-10 DIAGNOSIS — E538 Deficiency of other specified B group vitamins: Secondary | ICD-10-CM | POA: Diagnosis not present

## 2018-10-10 MED ORDER — CYANOCOBALAMIN 1000 MCG/ML IJ SOLN
1000.0000 ug | Freq: Once | INTRAMUSCULAR | Status: AC
  Administered 2018-10-10: 1000 ug via INTRAMUSCULAR

## 2018-10-28 ENCOUNTER — Encounter (INDEPENDENT_AMBULATORY_CARE_PROVIDER_SITE_OTHER): Payer: Self-pay | Admitting: Physician Assistant

## 2018-10-28 ENCOUNTER — Ambulatory Visit (INDEPENDENT_AMBULATORY_CARE_PROVIDER_SITE_OTHER): Payer: BLUE CROSS/BLUE SHIELD | Admitting: Physician Assistant

## 2018-10-28 VITALS — BP 107/71 | HR 92 | Temp 98.4°F | Ht 67.0 in | Wt 276.0 lb

## 2018-10-28 DIAGNOSIS — Z9189 Other specified personal risk factors, not elsewhere classified: Secondary | ICD-10-CM

## 2018-10-28 DIAGNOSIS — E8881 Metabolic syndrome: Secondary | ICD-10-CM | POA: Diagnosis not present

## 2018-10-28 DIAGNOSIS — Z6841 Body Mass Index (BMI) 40.0 and over, adult: Secondary | ICD-10-CM

## 2018-10-28 DIAGNOSIS — E559 Vitamin D deficiency, unspecified: Secondary | ICD-10-CM

## 2018-10-28 MED ORDER — VITAMIN D (ERGOCALCIFEROL) 1.25 MG (50000 UNIT) PO CAPS: 50000.0000 [IU] | ORAL_CAPSULE | ORAL | 0 refills | Status: DC

## 2018-10-28 NOTE — Progress Notes (Signed)
Office: [phone]  /  Fax: [phone]   HPI:   Chief Complaint: OBESITY Stacey Huerta is here to discuss her progress with her obesity treatment plan. She is keeping a food journal with 1600 calories and 100 protein daily and is following her eating plan approximately 50 % of the time. She states she is doing orange theory 60 minutes 4 times per week. Kabrina did well with maintenance  over the holidays. She is going through fertility treatments. She is ready to get back on track. Her weight is 276 lb (125.2 kg) today and has lost 0 lbs since her last visit. She has lost 14 lbs since starting treatment with [place].  Vitamin D deficiency Stacey Huerta has a diagnosis of vitamin D deficiency. She is currently taking prescription Vit D and denies nausea, vomiting or muscle weakness. Her last level was at goal.  Insulin Resistance Stacey Huerta has a diagnosis of insulin resistance based on her elevated fasting insulin level >5. Although Stacey Huerta's blood glucose readings are still under good control, insulin resistance puts her at greater risk of metabolic syndrome and diabetes. She is taking metformin and denies polyphagia currently and continues to work on diet and exercise to decrease risk of diabetes.    ASSESSMENT AND PLAN:  Vitamin D deficiency - Plan: Vitamin D, Ergocalciferol, (DRISDOL) 1.25 MG (50000 UT) CAPS capsule  Insulin resistance  At risk for diabetes mellitus  Class 3 severe obesity with serious comorbidity and body mass index (BMI) of 40.0 to 44.9 in adult, unspecified obesity type ([place])  PLAN:  Vitamin D Deficiency Stacey Huerta was informed that low vitamin D levels contributes to fatigue and are associated with obesity, breast, and colon cancer. She agrees to continue to take prescription Vit D 50,000 IU bid #10 with no refills and will follow up for routine testing of vitamin D, at least 2-3 times per year. She was informed of the risk of over-replacement of vitamin D and agrees to not  increase her dose unless she discusses this with [place] first.  Insulin Resistance Stacey Huerta will continue to work on weight loss, exercise, and decreasing simple carbohydrates in her diet to help decrease the risk of diabetes. We dicussed metformin including benefits and risks. She was informed that eating too many simple carbohydrates or too many calories at one sitting increases the likelihood of GI side effects. Stacey Huerta to continue taking metformin and prescription was not written today.Stacey Huerta agrees to follow up with our clinic in 2 weeks.  Obesity Stacey Huerta is currently in the action stage of change. As such, her goal is to continue with weight loss efforts She has agreed to keep a food journal with 1600 calories and 100 grams protein daily. Charles has been instructed to work up to a goal of 150 minutes of combined cardio and strengthening exercise per week for weight loss and overall health benefits. We discussed the following Behavioral Modification Strategies today: increasing lean protein intake and work on meal planning and cooking strategies.  Stacey Huerta has agreed to follow up with our clinic in 2 weeks. She was informed of the importance of frequent follow up visits to maximize her success with intensive lifestyle modifications for her multiple health conditions.  ALLERGIES: Allergies  Allergen Reactions  . Eggs Or Egg-Derived Products   . Sulfonamide Derivatives Hives    Also high fever  . Ganoderma Lucidum   . [place]   . Other     Mushrooms and [place] Nuts  . Penicillins     Childhood reaction  .  Shellfish Allergy Nausea And Vomiting  . Fish-Derived Products Rash    MEDICATIONS: Current Outpatient Medications on File Prior to Visit  Medication Sig Dispense Refill  . aspirin EC 81 MG tablet Take 1 tablet (81 mg total) by mouth daily.    . B Complex-C-Folic Acid (B-COMPLEX BALANCED PO) Take by mouth.    . calcium carbonate 200 MG capsule Take 250 mg by mouth 2 (two) times  daily with a meal.    . Cyanocobalamin (VITAMIN B-12) 1000 MCG SUBL Place under the tongue.    . Cyanocobalamin (VITAMIN B-12) 1000 MCG/15ML LIQD Take by mouth.    . estradiol (ESTRACE) 2 MG tablet Take 2 mg by mouth daily.    Marland Kitchen letrozole (FEMARA) 2.5 MG tablet Take 5 mg by mouth daily.    Marland Kitchen MAGNESIUM PO Take 1 tablet by mouth daily.    . metFORMIN (GLUCOPHAGE) 500 MG tablet Take 1 tablet (500 mg total) by mouth daily with breakfast. 90 tablet 0  . Nutritional Supplements (DHEA PO) Take 1 tablet by mouth daily.    . Omega-3 Fatty Acids (FISH OIL) 1000 MG CAPS Take by mouth.    . Prenatal Vit-Fe Fumarate-FA (PRENATAL VITAMIN PO) Take 1 tablet by mouth daily.    . Probiotic Product (PROBIOTIC-10 PO) Take by mouth.    . progesterone (PROMETRIUM) 200 MG capsule Take 200 mg by mouth 2 (two) times daily.    Marland Kitchen pyridOXINE (VITAMIN B-6) 50 MG tablet Take 50 mg by mouth daily.    . SELENIUM PO Take 1 tablet by mouth daily.    Marland Kitchen VITAMIN E PO Take 1 tablet by mouth daily.    Marland Kitchen zinc gluconate 50 MG tablet Take 50 mg by mouth daily.     Current Facility-Administered Medications on File Prior to Visit  Medication Dose Route Frequency Provider Last Rate Last Dose  . cyanocobalamin ((VITAMIN B-12)) injection 1,000 mcg  1,000 mcg Intramuscular Once Ann Held, DO        PAST MEDICAL HISTORY: Past Medical History:  Diagnosis Date  . Allergy   . Back pain   . Dyspnea   . Fibroid   . Headache(784.0)   . Hypertension during pregnancy   . Infertility, female   . Leg edema   . Osteoarthritis   . Postpartum depression   . Sjogren's syndrome ([place]) 10/28/2015  . Vitamin B 12 deficiency   . Vitamin D deficiency     PAST SURGICAL HISTORY: Past Surgical History:  Procedure Laterality Date  . ABDOMINAL ADHESION SURGERY    . CESAREAN SECTION N/A 03/18/2014   Procedure: CESAREAN SECTION;  Surgeon: Delice Lesch, MD;  Location: [facility];  Service: Obstetrics;  Laterality: N/A;  spinal/epidural    . CHOLECYSTECTOMY    . ENDOMETRIAL ABLATION    . LYMPHADENECTOMY     Removed from the neck at age 35  . MYOMECTOMY    . MYOMECTOMY  05/23/2012   Procedure: MYOMECTOMY;  Surgeon: Governor Specking, MD;  Location: [facility];  Service: Gynecology;  Laterality: N/A;    SOCIAL HISTORY: Social History   Tobacco Use  . Smoking status: Never Smoker  . Smokeless tobacco: Never Used  Substance Use Topics  . Alcohol use: Yes    Alcohol/week: 0.0 standard drinks    Comment: socially  . Drug use: No    FAMILY HISTORY: Family History  Problem Relation Age of Onset  . Hypertension Paternal Grandfather   . Diabetes Paternal Grandfather   .  Diabetes Father   . Hypotension Father   . Colon cancer Paternal Grandmother   . Diabetes Paternal Grandmother   . Prostate cancer Maternal Grandfather   . Hyperlipidemia Mother   . Obesity Mother   . Breast cancer Maternal Aunt   . Arthritis Paternal Aunt   . Hyperlipidemia Maternal Aunt        x's 2    ROS: Review of Systems  Constitutional: Negative for weight loss.  Gastrointestinal: Negative for diarrhea, nausea and vomiting.  Genitourinary: Negative for frequency.  Musculoskeletal:       Negative for weight loss  Endo/Heme/Allergies:       Negative for polyphagia    PHYSICAL EXAM: Blood pressure 107/71, pulse 92, temperature 98.4 F (36.9 C), temperature source Oral, height 5\' 7"  (1.702 m), weight 276 lb (125.2 kg), SpO2 98 %. Body mass index is 43.23 kg/m. Physical Exam Vitals signs reviewed.  Constitutional:      Appearance: Normal appearance. She is obese.  Cardiovascular:     Rate and Rhythm: Normal rate.     Pulses: Normal pulses.  Pulmonary:     Effort: Pulmonary effort is normal.  Musculoskeletal: Normal range of motion.  Skin:    General: Skin is warm and dry.  Neurological:     Mental Status: She is alert and oriented to person, place, and time.  Psychiatric:        Mood and Affect: Mood normal.        Behavior:  Behavior normal.     RECENT LABS AND TESTS: BMET    Component Value Date/Time   NA 136 10/07/2018 1149   K 4.0 10/07/2018 1149   CL 101 10/07/2018 1149   CO2 22 10/07/2018 1149   GLUCOSE 71 10/07/2018 1149   GLUCOSE 104 (H) 05/02/2018 1216   BUN 10 10/07/2018 1149   CREATININE 0.89 10/07/2018 1149   CREATININE 0.98 08/19/2014 1223   CALCIUM 9.3 10/07/2018 1149   GFRNONAA 79 10/07/2018 1149   GFRAA 91 10/07/2018 1149   Lab Results  Component Value Date   HGBA1C 4.9 10/07/2018   HGBA1C 5.0 06/26/2018   HGBA1C 5.2 12/19/2015   HGBA1C 5.2 05/31/2008   Lab Results  Component Value Date   INSULIN 10.5 10/07/2018   INSULIN 15.4 06/26/2018   CBC    Component Value Date/Time   WBC 4.4 06/26/2018 1130   WBC 4.1 05/02/2018 1216   RBC 4.38 06/26/2018 1130   RBC 4.23 05/02/2018 1216   HGB 13.6 06/26/2018 1130   HCT 41.1 06/26/2018 1130   PLT 182.0 05/02/2018 1216   MCV 94 06/26/2018 1130   MCH 31.1 06/26/2018 1130   MCH 30.0 08/19/2014 1226   MCH 32.4 03/20/2014 0812   MCHC 33.1 06/26/2018 1130   MCHC 33.6 05/02/2018 1216   RDW 12.0 (L) 06/26/2018 1130   LYMPHSABS 1.3 06/26/2018 1130   MONOABS 0.3 05/02/2018 1216   EOSABS 0.0 06/26/2018 1130   BASOSABS 0.0 06/26/2018 1130   Iron/TIBC/Ferritin/ %Sat No results found for: IRON, TIBC, FERRITIN, IRONPCTSAT Lipid Panel     Component Value Date/Time   CHOL 168 10/07/2018 1149   TRIG 106 10/07/2018 1149   HDL 50 10/07/2018 1149   CHOLHDL 4 12/19/2015 0852   VLDL 20.2 12/19/2015 0852   LDLCALC 97 10/07/2018 1149   Hepatic Function Panel     Component Value Date/Time   PROT 7.4 10/07/2018 1149   ALBUMIN 4.1 10/07/2018 1149   AST 24 10/07/2018 1149   ALT  20 10/07/2018 1149   ALKPHOS 69 10/07/2018 1149   BILITOT 1.1 10/07/2018 1149   BILIDIR 0.1 12/14/2014 1402   IBILI 0.9 04/30/2008 2117      Component Value Date/Time   TSH 2.420 06/26/2018 1130   TSH 1.59 05/02/2018 1216   TSH 1.34 12/14/2014 1402      Ref. Range 10/07/2018 11:49  Vitamin D, 25-Hydroxy Latest Ref Range: 30.0 - 100.0 ng/mL 40.9     OBESITY BEHAVIORAL INTERVENTION VISIT  Today's visit was # 8  Starting weight: 290 lbs Starting date: 06/26/2018 Today's weight :  276 lbs  Today's date: 10/28/2018 Total lbs lost to date: 20   ASK: We discussed the diagnosis of obesity with Stacey Huerta today and Stacey Huerta agreed to give [place] permission to discuss obesity behavioral modification therapy today.  ASSESS: Shakaria has the diagnosis of obesity and her BMI today is 43.22 Aasiyah is in the action stage of change   ADVISE: Casidee was educated on the multiple health risks of obesity as well as the benefit of weight loss to improve her health. She was advised of the need for long term treatment and the importance of lifestyle modifications to improve her current health and to decrease her risk of future health problems.  AGREE: Multiple dietary modification options and treatment options were discussed and  Mertie agreed to follow the recommendations documented in the above note.  ARRANGE: Seriah was educated on the importance of frequent visits to treat obesity as outlined per CMS and USPSTF guidelines and agreed to schedule her next follow up appointment today.  I, Tammy Wysor, am acting as Location manager for [employer], PA-C I, Abby Potash, PA-C have reviewed above note and agree with its content

## 2018-10-29 DIAGNOSIS — N978 Female infertility of other origin: Secondary | ICD-10-CM | POA: Diagnosis not present

## 2018-11-06 DIAGNOSIS — Z8279 Family history of other congenital malformations, deformations and chromosomal abnormalities: Secondary | ICD-10-CM | POA: Diagnosis not present

## 2018-11-06 DIAGNOSIS — N978 Female infertility of other origin: Secondary | ICD-10-CM | POA: Diagnosis not present

## 2018-11-07 DIAGNOSIS — N978 Female infertility of other origin: Secondary | ICD-10-CM | POA: Diagnosis not present

## 2018-11-07 DIAGNOSIS — Z31438 Encounter for other genetic testing of female for procreative management: Secondary | ICD-10-CM | POA: Diagnosis not present

## 2018-11-07 DIAGNOSIS — N96 Recurrent pregnancy loss: Secondary | ICD-10-CM | POA: Diagnosis not present

## 2018-11-12 DIAGNOSIS — N978 Female infertility of other origin: Secondary | ICD-10-CM | POA: Diagnosis not present

## 2018-11-13 ENCOUNTER — Ambulatory Visit (INDEPENDENT_AMBULATORY_CARE_PROVIDER_SITE_OTHER): Payer: BLUE CROSS/BLUE SHIELD | Admitting: Physician Assistant

## 2018-11-13 ENCOUNTER — Encounter (INDEPENDENT_AMBULATORY_CARE_PROVIDER_SITE_OTHER): Payer: Self-pay | Admitting: Physician Assistant

## 2018-11-13 VITALS — BP 105/65 | HR 49 | Temp 97.6°F | Ht 67.0 in | Wt 278.0 lb

## 2018-11-13 DIAGNOSIS — E559 Vitamin D deficiency, unspecified: Secondary | ICD-10-CM

## 2018-11-13 DIAGNOSIS — Z6841 Body Mass Index (BMI) 40.0 and over, adult: Secondary | ICD-10-CM

## 2018-11-13 NOTE — Progress Notes (Signed)
Office: [phone]  /  Fax: [phone]   HPI:   Chief Complaint: OBESITY Virda is here to discuss her progress with her obesity treatment plan. She is on the  keep a food journal with 1600 calories and 100 grams of protein daily and is following her eating plan approximately 50 % of the time. She states she is doing orange theory 60 minutes 3-4 times per week. Bryann reports that she has not been journaling regularly. She has not been eating on plan and has been tempted by foods her husband is bringing into the house. Her weight is 278 lb (126.1 kg) today and has lost 0 lbs since her last visit. She has lost 12 lbs since starting treatment with [place].  Vitamin D deficiency Christyann has a diagnosis of vitamin D deficiency. She is currently taking prescription Vit D and denies nausea, vomiting or muscle weakness.  ASSESSMENT AND PLAN:  Vitamin D deficiency  Class 3 severe obesity with serious comorbidity and body mass index (BMI) of 40.0 to 44.9 in adult, unspecified obesity type ([place])  PLAN:  Vitamin D Deficiency Laverda was informed that low vitamin D levels contributes to fatigue and are associated with obesity, breast, and colon cancer. She agrees to continue to take prescription Vit D @50 ,000 IU every 3 days and will follow up for routine testing of vitamin D, at least 2-3 times per year. She was informed of the risk of over-replacement of vitamin D and agrees to not increase her dose unless she discusses this with [place] first. Jeanet agrees to follow up with our clinic in 2 weeks  I spent > than 50% of the 15 minute visit on counseling as documented in the note.  Obesity Emilya is currently in the action stage of change. As such, her goal is to continue with weight loss efforts She has agreed to keep a food journal with 1600 calories and 100 grams of protein daily Tyreka has been instructed to work up to a goal of 150 minutes of combined cardio and strengthening exercise per  week for weight loss and overall health benefits. We discussed the following Behavioral Modification Strategies today: work on meal planning and easy cooking plans and dealing with family or coworker sabotage  Donya has agreed to follow up with our clinic in 2 weeks. She was informed of the importance of frequent follow up visits to maximize her success with intensive lifestyle modifications for her multiple health conditions.  ALLERGIES: Allergies  Allergen Reactions  . Eggs Or Egg-Derived Products   . Sulfonamide Derivatives Hives    Also high fever  . Ganoderma Lucidum   . [place]   . Other     Mushrooms and [place] Nuts  . Penicillins     Childhood reaction  . Shellfish Allergy Nausea And Vomiting  . Fish-Derived Products Rash    MEDICATIONS: Current Outpatient Medications on File Prior to Visit  Medication Sig Dispense Refill  . aspirin EC 81 MG tablet Take 1 tablet (81 mg total) by mouth daily.    . B Complex-C-Folic Acid (B-COMPLEX BALANCED PO) Take by mouth.    . calcium carbonate 200 MG capsule Take 250 mg by mouth 2 (two) times daily with a meal.    . Cyanocobalamin (VITAMIN B-12) 1000 MCG SUBL Place under the tongue.    . Cyanocobalamin (VITAMIN B-12) 1000 MCG/15ML LIQD Take by mouth.    . estradiol (ESTRACE) 2 MG tablet Take 2 mg by mouth daily.    Marland Kitchen  letrozole (FEMARA) 2.5 MG tablet Take 5 mg by mouth daily.    Marland Kitchen MAGNESIUM PO Take 1 tablet by mouth daily.    . metFORMIN (GLUCOPHAGE) 500 MG tablet Take 1 tablet (500 mg total) by mouth daily with breakfast. 90 tablet 0  . Nutritional Supplements (DHEA PO) Take 1 tablet by mouth daily.    . Omega-3 Fatty Acids (FISH OIL) 1000 MG CAPS Take by mouth.    . Prenatal Vit-Fe Fumarate-FA (PRENATAL VITAMIN PO) Take 1 tablet by mouth daily.    . Probiotic Product (PROBIOTIC-10 PO) Take by mouth.    . progesterone (PROMETRIUM) 200 MG capsule Take 200 mg by mouth 2 (two) times daily.    Marland Kitchen pyridOXINE (VITAMIN B-6) 50 MG tablet  Take 50 mg by mouth daily.    . SELENIUM PO Take 1 tablet by mouth daily.    . Vitamin D, Ergocalciferol, (DRISDOL) 1.25 MG (50000 UT) CAPS capsule Take 1 capsule (50,000 Units total) by mouth every 3 (three) days. 10 capsule 0  . VITAMIN E PO Take 1 tablet by mouth daily.    Marland Kitchen zinc gluconate 50 MG tablet Take 50 mg by mouth daily.     Current Facility-Administered Medications on File Prior to Visit  Medication Dose Route Frequency Provider Last Rate Last Dose  . cyanocobalamin ((VITAMIN B-12)) injection 1,000 mcg  1,000 mcg Intramuscular Once Ann Held, DO        PAST MEDICAL HISTORY: Past Medical History:  Diagnosis Date  . Allergy   . Back pain   . Dyspnea   . Fibroid   . Headache(784.0)   . Hypertension during pregnancy   . Infertility, female   . Leg edema   . Osteoarthritis   . Postpartum depression   . Sjogren's syndrome ([place]) 10/28/2015  . Vitamin B 12 deficiency   . Vitamin D deficiency     PAST SURGICAL HISTORY: Past Surgical History:  Procedure Laterality Date  . ABDOMINAL ADHESION SURGERY    . CESAREAN SECTION N/A 03/18/2014   Procedure: CESAREAN SECTION;  Surgeon: Delice Lesch, MD;  Location: [facility];  Service: Obstetrics;  Laterality: N/A;  spinal/epidural  . CHOLECYSTECTOMY    . ENDOMETRIAL ABLATION    . LYMPHADENECTOMY     Removed from the neck at age 25  . MYOMECTOMY    . MYOMECTOMY  05/23/2012   Procedure: MYOMECTOMY;  Surgeon: Governor Specking, MD;  Location: [facility];  Service: Gynecology;  Laterality: N/A;    SOCIAL HISTORY: Social History   Tobacco Use  . Smoking status: Never Smoker  . Smokeless tobacco: Never Used  Substance Use Topics  . Alcohol use: Yes    Alcohol/week: 0.0 standard drinks    Comment: socially  . Drug use: No    FAMILY HISTORY: Family History  Problem Relation Age of Onset  . Hypertension Paternal Grandfather   . Diabetes Paternal Grandfather   . Diabetes Father   . Hypotension Father   . Colon cancer  Paternal Grandmother   . Diabetes Paternal Grandmother   . Prostate cancer Maternal Grandfather   . Hyperlipidemia Mother   . Obesity Mother   . Breast cancer Maternal Aunt   . Arthritis Paternal Aunt   . Hyperlipidemia Maternal Aunt        x's 2    ROS: Review of Systems  Constitutional: Negative for weight loss.  Gastrointestinal: Negative for nausea and vomiting.  Musculoskeletal:       Negative for muscle weakness  PHYSICAL EXAM: Blood pressure 105/65, pulse (!) 49, temperature 97.6 F (36.4 C), temperature source Oral, height 5\' 7"  (1.702 m), weight 278 lb (126.1 kg), SpO2 100 %. Body mass index is 43.54 kg/m. Physical Exam Vitals signs reviewed.  Constitutional:      Appearance: Normal appearance. She is obese.  Cardiovascular:     Rate and Rhythm: Normal rate.     Pulses: Normal pulses.  Pulmonary:     Effort: Pulmonary effort is normal.  Musculoskeletal: Normal range of motion.  Skin:    General: Skin is warm and dry.  Neurological:     Mental Status: She is alert and oriented to person, place, and time.  Psychiatric:        Mood and Affect: Mood normal.        Behavior: Behavior normal.     RECENT LABS AND TESTS: BMET    Component Value Date/Time   NA 136 10/07/2018 1149   K 4.0 10/07/2018 1149   CL 101 10/07/2018 1149   CO2 22 10/07/2018 1149   GLUCOSE 71 10/07/2018 1149   GLUCOSE 104 (H) 05/02/2018 1216   BUN 10 10/07/2018 1149   CREATININE 0.89 10/07/2018 1149   CREATININE 0.98 08/19/2014 1223   CALCIUM 9.3 10/07/2018 1149   GFRNONAA 79 10/07/2018 1149   GFRAA 91 10/07/2018 1149   Lab Results  Component Value Date   HGBA1C 4.9 10/07/2018   HGBA1C 5.0 06/26/2018   HGBA1C 5.2 12/19/2015   HGBA1C 5.2 05/31/2008   Lab Results  Component Value Date   INSULIN 10.5 10/07/2018   INSULIN 15.4 06/26/2018   CBC    Component Value Date/Time   WBC 4.4 06/26/2018 1130   WBC 4.1 05/02/2018 1216   RBC 4.38 06/26/2018 1130   RBC 4.23  05/02/2018 1216   HGB 13.6 06/26/2018 1130   HCT 41.1 06/26/2018 1130   PLT 182.0 05/02/2018 1216   MCV 94 06/26/2018 1130   MCH 31.1 06/26/2018 1130   MCH 30.0 08/19/2014 1226   MCH 32.4 03/20/2014 0812   MCHC 33.1 06/26/2018 1130   MCHC 33.6 05/02/2018 1216   RDW 12.0 (L) 06/26/2018 1130   LYMPHSABS 1.3 06/26/2018 1130   MONOABS 0.3 05/02/2018 1216   EOSABS 0.0 06/26/2018 1130   BASOSABS 0.0 06/26/2018 1130   Iron/TIBC/Ferritin/ %Sat No results found for: IRON, TIBC, FERRITIN, IRONPCTSAT Lipid Panel     Component Value Date/Time   CHOL 168 10/07/2018 1149   TRIG 106 10/07/2018 1149   HDL 50 10/07/2018 1149   CHOLHDL 4 12/19/2015 0852   VLDL 20.2 12/19/2015 0852   LDLCALC 97 10/07/2018 1149   Hepatic Function Panel     Component Value Date/Time   PROT 7.4 10/07/2018 1149   ALBUMIN 4.1 10/07/2018 1149   AST 24 10/07/2018 1149   ALT 20 10/07/2018 1149   ALKPHOS 69 10/07/2018 1149   BILITOT 1.1 10/07/2018 1149   BILIDIR 0.1 12/14/2014 1402   IBILI 0.9 04/30/2008 2117      Component Value Date/Time   TSH 2.420 06/26/2018 1130   TSH 1.59 05/02/2018 1216   TSH 1.34 12/14/2014 1402    Ref. Range 10/07/2018 11:49  Vitamin D, 25-Hydroxy Latest Ref Range: 30.0 - 100.0 ng/mL 40.9      OBESITY BEHAVIORAL INTERVENTION VISIT  Today's visit was # 9   Starting weight: 290 lbs Starting date: 06/26/2018 Today's weight :278 lbs Today's date: 11/13/2018 Total lbs lost to date: 12   ASK: We discussed the diagnosis  of obesity with Venda Rodes today and Nadirah agreed to give [place] permission to discuss obesity behavioral modification therapy today.  ASSESS: Nakyah has the diagnosis of obesity and her BMI today is 43.58 Heidi is in the action stage of change   ADVISE: Brynlea was educated on the multiple health risks of obesity as well as the benefit of weight loss to improve her health. She was advised of the need for long term treatment and the importance of  lifestyle modifications to improve her current health and to decrease her risk of future health problems.  AGREE: Multiple dietary modification options and treatment options were discussed and  Zakira agreed to follow the recommendations documented in the above note.  ARRANGE: Ciella was educated on the importance of frequent visits to treat obesity as outlined per CMS and USPSTF guidelines and agreed to schedule her next follow up appointment today.  I, Tammy Wysor, am acting as Location manager for [employer] I, Abby Potash, PA-C have reviewed above note and agree with its content

## 2018-11-14 ENCOUNTER — Ambulatory Visit (INDEPENDENT_AMBULATORY_CARE_PROVIDER_SITE_OTHER): Payer: BLUE CROSS/BLUE SHIELD

## 2018-11-14 DIAGNOSIS — E538 Deficiency of other specified B group vitamins: Secondary | ICD-10-CM | POA: Diagnosis not present

## 2018-11-14 MED ORDER — CYANOCOBALAMIN 1000 MCG/ML IJ SOLN
1000.0000 ug | Freq: Once | INTRAMUSCULAR | Status: AC
  Administered 2018-11-14: 1000 ug via INTRAMUSCULAR

## 2018-11-14 NOTE — Progress Notes (Addendum)
Pt here for monthly B12 injection per   B12 101mcg given IM, and pt tolerated injection well.  Next B12 injection scheduled for 12-19-2018.  Agree with b12 injection for dx low b12.  Mackie Pai, PA-C

## 2018-11-18 DIAGNOSIS — N978 Female infertility of other origin: Secondary | ICD-10-CM | POA: Diagnosis not present

## 2018-11-20 ENCOUNTER — Other Ambulatory Visit (INDEPENDENT_AMBULATORY_CARE_PROVIDER_SITE_OTHER): Payer: Self-pay | Admitting: Physician Assistant

## 2018-11-20 DIAGNOSIS — N978 Female infertility of other origin: Secondary | ICD-10-CM | POA: Diagnosis not present

## 2018-11-20 DIAGNOSIS — E559 Vitamin D deficiency, unspecified: Secondary | ICD-10-CM

## 2018-11-25 ENCOUNTER — Other Ambulatory Visit (INDEPENDENT_AMBULATORY_CARE_PROVIDER_SITE_OTHER): Payer: Self-pay | Admitting: Physician Assistant

## 2018-11-25 DIAGNOSIS — E559 Vitamin D deficiency, unspecified: Secondary | ICD-10-CM

## 2018-11-27 ENCOUNTER — Ambulatory Visit (INDEPENDENT_AMBULATORY_CARE_PROVIDER_SITE_OTHER): Payer: BLUE CROSS/BLUE SHIELD | Admitting: Physician Assistant

## 2018-12-11 ENCOUNTER — Ambulatory Visit (INDEPENDENT_AMBULATORY_CARE_PROVIDER_SITE_OTHER): Payer: BLUE CROSS/BLUE SHIELD | Admitting: Physician Assistant

## 2018-12-19 ENCOUNTER — Ambulatory Visit: Payer: BLUE CROSS/BLUE SHIELD

## 2018-12-23 ENCOUNTER — Ambulatory Visit: Payer: BLUE CROSS/BLUE SHIELD | Admitting: Family Medicine

## 2018-12-23 ENCOUNTER — Encounter: Payer: Self-pay | Admitting: Family Medicine

## 2018-12-23 VITALS — BP 122/80 | HR 62 | Temp 98.7°F | Wt 287.0 lb

## 2018-12-23 DIAGNOSIS — J029 Acute pharyngitis, unspecified: Secondary | ICD-10-CM | POA: Diagnosis not present

## 2018-12-23 DIAGNOSIS — R05 Cough: Secondary | ICD-10-CM

## 2018-12-23 DIAGNOSIS — J111 Influenza due to unidentified influenza virus with other respiratory manifestations: Secondary | ICD-10-CM

## 2018-12-23 DIAGNOSIS — J324 Chronic pansinusitis: Secondary | ICD-10-CM

## 2018-12-23 DIAGNOSIS — R059 Cough, unspecified: Secondary | ICD-10-CM

## 2018-12-23 HISTORY — DX: Influenza due to unidentified influenza virus with other respiratory manifestations: J11.1

## 2018-12-23 HISTORY — DX: Chronic pansinusitis: J32.4

## 2018-12-23 LAB — POCT INFLUENZA A/B
Influenza A, POC: NEGATIVE
Influenza B, POC: NEGATIVE

## 2018-12-23 LAB — POCT RAPID STREP A (OFFICE): Rapid Strep A Screen: NEGATIVE

## 2018-12-23 MED ORDER — OSELTAMIVIR PHOSPHATE 75 MG PO CAPS: 75.0000 mg | ORAL_CAPSULE | Freq: Two times a day (BID) | ORAL | 0 refills | Status: DC

## 2018-12-23 MED ORDER — PROMETHAZINE-DM 6.25-15 MG/5ML PO SYRP: 5.0000 mL | ORAL_SOLUTION | Freq: Four times a day (QID) | ORAL | 0 refills | Status: DC | PRN

## 2018-12-23 MED ORDER — DOXYCYCLINE HYCLATE 100 MG PO TABS: 100.0000 mg | ORAL_TABLET | Freq: Two times a day (BID) | ORAL | 0 refills | Status: DC

## 2018-12-23 NOTE — Assessment & Plan Note (Signed)
Flu test neg  Clinically -- sudden onset fever 100 and other symptoms with tylenol  Will tx with tamiflu

## 2018-12-23 NOTE — Patient Instructions (Signed)
Sinusitis, Adult  Sinusitis is inflammation of your sinuses. Sinuses are hollow spaces in the bones around your face. Your sinuses are located:   Around your eyes.   In the middle of your forehead.   Behind your nose.   In your cheekbones.  Mucus normally drains out of your sinuses. When your nasal tissues become inflamed or swollen, mucus can become trapped or blocked. This allows bacteria, viruses, and fungi to grow, which leads to infection. Most infections of the sinuses are caused by a virus.  Sinusitis can develop quickly. It can last for up to 4 weeks (acute) or for more than 12 weeks (chronic). Sinusitis often develops after a cold.  What are the causes?  This condition is caused by anything that creates swelling in the sinuses or stops mucus from draining. This includes:   Allergies.   Asthma.   Infection from bacteria or viruses.   Deformities or blockages in your nose or sinuses.   Abnormal growths in the nose (nasal polyps).   Pollutants, such as chemicals or irritants in the air.   Infection from fungi (rare).  What increases the risk?  You are more likely to develop this condition if you:   Have a weak body defense system (immune system).   Do a lot of swimming or diving.   Overuse nasal sprays.   Smoke.  What are the signs or symptoms?  The main symptoms of this condition are pain and a feeling of pressure around the affected sinuses. Other symptoms include:   Stuffy nose or congestion.   Thick drainage from your nose.   Swelling and warmth over the affected sinuses.   Headache.   Upper toothache.   A cough that may get worse at night.   Extra mucus that collects in the throat or the back of the nose (postnasal drip).   Decreased sense of smell and taste.   Fatigue.   A fever.   Sore throat.   Bad breath.  How is this diagnosed?  This condition is diagnosed based on:   Your symptoms.   Your medical history.   A physical exam.   Tests to find out if your condition is  acute or chronic. This may include:  ? Checking your nose for nasal polyps.  ? Viewing your sinuses using a device that has a light (endoscope).  ? Testing for allergies or bacteria.  ? Imaging tests, such as an MRI or CT scan.  In rare cases, a bone biopsy may be done to rule out more serious types of fungal sinus disease.  How is this treated?  Treatment for sinusitis depends on the cause and whether your condition is chronic or acute.   If caused by a virus, your symptoms should go away on their own within 10 days. You may be given medicines to relieve symptoms. They include:  ? Medicines that shrink swollen nasal passages (topical intranasal decongestants).  ? Medicines that treat allergies (antihistamines).  ? A spray that eases inflammation of the nostrils (topical intranasal corticosteroids).  ? Rinses that help get rid of thick mucus in your nose (nasal saline washes).   If caused by bacteria, your health care provider may recommend waiting to see if your symptoms improve. Most bacterial infections will get better without antibiotic medicine. You may be given antibiotics if you have:  ? A severe infection.  ? A weak immune system.   If caused by narrow nasal passages or nasal polyps, you may need   to have surgery.  Follow these instructions at home:  Medicines   Take, use, or apply over-the-counter and prescription medicines only as told by your health care provider. These may include nasal sprays.   If you were prescribed an antibiotic medicine, take it as told by your health care provider. Do not stop taking the antibiotic even if you start to feel better.  Hydrate and humidify     Drink enough fluid to keep your urine pale yellow. Staying hydrated will help to thin your mucus.   Use a cool mist humidifier to keep the humidity level in your home above 50%.   Inhale steam for 10-15 minutes, 3-4 times a day, or as told by your health care provider. You can do this in the bathroom while a hot shower is  running.   Limit your exposure to cool or dry air.  Rest   Rest as much as possible.   Sleep with your head raised (elevated).   Make sure you get enough sleep each night.  General instructions     Apply a warm, moist washcloth to your face 3-4 times a day or as told by your health care provider. This will help with discomfort.   Wash your hands often with soap and water to reduce your exposure to germs. If soap and water are not available, use hand sanitizer.   Do not smoke. Avoid being around people who are smoking (secondhand smoke).   Keep all follow-up visits as told by your health care provider. This is important.  Contact a health care provider if:   You have a fever.   Your symptoms get worse.   Your symptoms do not improve within 10 days.  Get help right away if:   You have a severe headache.   You have persistent vomiting.   You have severe pain or swelling around your face or eyes.   You have vision problems.   You develop confusion.   Your neck is stiff.   You have trouble breathing.  Summary   Sinusitis is soreness and inflammation of your sinuses. Sinuses are hollow spaces in the bones around your face.   This condition is caused by nasal tissues that become inflamed or swollen. The swelling traps or blocks the flow of mucus. This allows bacteria, viruses, and fungi to grow, which leads to infection.   If you were prescribed an antibiotic medicine, take it as told by your health care provider. Do not stop taking the antibiotic even if you start to feel better.   Keep all follow-up visits as told by your health care provider. This is important.  This information is not intended to replace advice given to you by your health care provider. Make sure you discuss any questions you have with your health care provider.  Document Released: 10/08/2005 Document Revised: 03/10/2018 Document Reviewed: 03/10/2018  [employer]vier Interactive Patient Education  2019 [employer]

## 2018-12-23 NOTE — Assessment & Plan Note (Signed)
con't flonase and otc antihistamine Abx per orders  And cough meds

## 2018-12-23 NOTE — Progress Notes (Signed)
Patient ID: Stacey Huerta, female    DOB: Jul 15, 1976  Age: 43 y.o. MRN: [id number]    Subjective:  Subjective  HPI Stacey Huerta presents for cough, congestion , fever , body aches since yesterday.  Also sore throat, sinus pressure.    Just got back from [place] -- they drove.    Review of Systems  Constitutional: Negative for appetite change, chills, diaphoresis, fatigue, fever and unexpected weight change.  HENT: Positive for congestion, postnasal drip, rhinorrhea, sinus pressure, sinus pain, sneezing and sore throat.   Eyes: Negative for pain, redness and visual disturbance.  Respiratory: Negative for cough, chest tightness, shortness of breath and wheezing.   Cardiovascular: Negative for chest pain, palpitations and leg swelling.  Endocrine: Negative for cold intolerance, heat intolerance, polydipsia, polyphagia and polyuria.  Genitourinary: Negative for difficulty urinating, dysuria and frequency.  Musculoskeletal: Positive for myalgias. Negative for back pain.  Allergic/Immunologic: Negative for environmental allergies.  Neurological: Negative for dizziness, light-headedness, numbness and headaches.    History Past Medical History:  Diagnosis Date  . Allergy   . Back pain   . Dyspnea   . Fibroid   . Headache(784.0)   . Hypertension during pregnancy   . Infertility, female   . Leg edema   . Osteoarthritis   . Postpartum depression   . Sjogren's syndrome ([place]) 10/28/2015  . Vitamin B 12 deficiency   . Vitamin D deficiency     She has a past surgical history that includes Cholecystectomy; Myomectomy; Myomectomy (05/23/2012); Lymphadenectomy; Abdominal adhesion surgery; Endometrial ablation; and Cesarean section (N/A, 03/18/2014).   Her family history includes Arthritis in her paternal aunt; Breast cancer in her maternal aunt; Colon cancer in her paternal grandmother; Diabetes in her father, paternal grandfather, and paternal grandmother; Hyperlipidemia in her  maternal aunt and mother; Hypertension in her paternal grandfather; Hypotension in her father; Obesity in her mother; Prostate cancer in her maternal grandfather.She reports that she has never smoked. She has never used smokeless tobacco. She reports current alcohol use. She reports that she does not use drugs.  Current Outpatient Medications on File Prior to Visit  Medication Sig Dispense Refill  . aspirin EC 81 MG tablet Take 1 tablet (81 mg total) by mouth daily.    . B Complex-C-Folic Acid (B-COMPLEX BALANCED PO) Take by mouth.    . calcium carbonate 200 MG capsule Take 250 mg by mouth 2 (two) times daily with a meal.    . Cyanocobalamin (VITAMIN B-12) 1000 MCG SUBL Place under the tongue.    . Cyanocobalamin (VITAMIN B-12) 1000 MCG/15ML LIQD Take by mouth.    . estradiol (ESTRACE) 2 MG tablet Take 2 mg by mouth daily.    Marland Kitchen letrozole (FEMARA) 2.5 MG tablet Take 5 mg by mouth daily.    Marland Kitchen MAGNESIUM PO Take 1 tablet by mouth daily.    . metFORMIN (GLUCOPHAGE) 500 MG tablet Take 1 tablet (500 mg total) by mouth daily with breakfast. 90 tablet 0  . Nutritional Supplements (DHEA PO) Take 1 tablet by mouth daily.    . Omega-3 Fatty Acids (FISH OIL) 1000 MG CAPS Take by mouth.    . Prenatal Vit-Fe Fumarate-FA (PRENATAL VITAMIN PO) Take 1 tablet by mouth daily.    . Probiotic Product (PROBIOTIC-10 PO) Take by mouth.    . progesterone (PROMETRIUM) 200 MG capsule Take 200 mg by mouth 2 (two) times daily.    Marland Kitchen pyridOXINE (VITAMIN B-6) 50 MG tablet Take 50 mg by mouth daily.    Marland Kitchen  SELENIUM PO Take 1 tablet by mouth daily.    . Vitamin D, Ergocalciferol, (DRISDOL) 1.25 MG (50000 UT) CAPS capsule TAKE 1 CAPSULE (50,000 UNITS TOTAL) BY MOUTH EVERY 3 (THREE) DAYS. 10 capsule 0  . VITAMIN E PO Take 1 tablet by mouth daily.    Marland Kitchen zinc gluconate 50 MG tablet Take 50 mg by mouth daily.     Current Facility-Administered Medications on File Prior to Visit  Medication Dose Route Frequency Provider Last Rate Last  Dose  . cyanocobalamin ((VITAMIN B-12)) injection 1,000 mcg  1,000 mcg Intramuscular Once Carollee Herter, Kendrick Fries R, DO         Objective:  Objective  Physical Exam Constitutional:      Appearance: She is well-developed. She is diaphoretic.  HENT:     Right Ear: External ear normal.     Left Ear: External ear normal.     Nose: Mucosal edema and rhinorrhea present. No nasal deformity.     Right Sinus: Maxillary sinus tenderness and frontal sinus tenderness present.     Left Sinus: Maxillary sinus tenderness and frontal sinus tenderness present.     Mouth/Throat:     Pharynx: No oropharyngeal exudate.  Eyes:     General:        Right eye: No discharge.        Left eye: No discharge.     Conjunctiva/sclera: Conjunctivae normal.  Neck:     Musculoskeletal: Normal range of motion and neck supple.  Cardiovascular:     Rate and Rhythm: Normal rate and regular rhythm.     Heart sounds: Normal heart sounds. No murmur.  Pulmonary:     Effort: Pulmonary effort is normal. No respiratory distress.     Breath sounds: Normal breath sounds. No wheezing or rales.  Chest:     Chest wall: No tenderness.  Lymphadenopathy:     Cervical: Cervical adenopathy present.  Skin:    General: Skin is warm.  Neurological:     Mental Status: She is alert and oriented to person, place, and time.    BP 122/80   Pulse 62   Temp 98.7 F (37.1 C)   Wt 287 lb (130.2 kg)   SpO2 98%   BMI 44.95 kg/m  Wt Readings from Last 3 Encounters:  12/23/18 287 lb (130.2 kg)  11/13/18 278 lb (126.1 kg)  10/28/18 276 lb (125.2 kg)     Lab Results  Component Value Date   WBC 4.4 06/26/2018   HGB 13.6 06/26/2018   HCT 41.1 06/26/2018   PLT 182.0 05/02/2018   GLUCOSE 71 10/07/2018   CHOL 168 10/07/2018   TRIG 106 10/07/2018   HDL 50 10/07/2018   LDLCALC 97 10/07/2018   ALT 20 10/07/2018   AST 24 10/07/2018   NA 136 10/07/2018   K 4.0 10/07/2018   CL 101 10/07/2018   CREATININE 0.89 10/07/2018   BUN 10  10/07/2018   CO2 22 10/07/2018   TSH 2.420 06/26/2018   HGBA1C 4.9 10/07/2018   MICROALBUR 5.0 (H) 12/19/2015    Dg Hysterogram (hsg)  Result Date: 08/02/2017 CLINICAL DATA:  Desires fertility. EXAM: HYSTEROSALPINGOGRAM TECHNIQUE: Following cleansing of the cervix and vagina with Betadine solution, a hysterosalpingogram was performed using a 5-French hysterosalpingogram catheter and Omnipaque 300 contrast. The patient tolerated the examination without difficulty. COMPARISON:  None. FLUOROSCOPY TIME:  Radiation Exposure Index (as provided by the fluoroscopic device): If the device does not provide the exposure index: Fluoroscopy Time:  42 seconds  Number of Acquired Images:  0 FINDINGS: The endometrial cavity is normal in appearance and contour. No signs of mullerian duct anomaly. Opacification of both fallopian tubes is seen. Both tubes appear normal. Intraperitoneal spill of contrast from both fallopian tubes is demonstrated. IMPRESSION: Normal study. Both fallopian tubes are patent. Electronically Signed   By: Kerby Moors M.D.   On: 08/02/2017 09:06     Assessment & Plan:  Plan  I am having Hartlyn A. Elizarraraz start on oseltamivir, doxycycline, and promethazine-dextromethorphan. I am also having her maintain her B Complex-C-Folic Acid (B-COMPLEX BALANCED PO), calcium carbonate, Fish Oil, zinc gluconate, aspirin EC, letrozole, Prenatal Vit-Fe Fumarate-FA (PRENATAL VITAMIN PO), SELENIUM PO, MAGNESIUM PO, Nutritional Supplements (DHEA PO), VITAMIN E PO, pyridOXINE, Vitamin B-12, Vitamin B-12, estradiol, Probiotic Product (PROBIOTIC-10 PO), progesterone, metFORMIN, and Vitamin D (Ergocalciferol). We will continue to administer cyanocobalamin.  Meds ordered this encounter  Medications  . oseltamivir (TAMIFLU) 75 MG capsule    Sig: Take 1 capsule (75 mg total) by mouth 2 (two) times daily.    Dispense:  10 capsule    Refill:  0  . doxycycline (VIBRA-TABS) 100 MG tablet    Sig: Take 1 tablet (100  mg total) by mouth 2 (two) times daily.    Dispense:  20 tablet    Refill:  0  . promethazine-dextromethorphan (PROMETHAZINE-DM) 6.25-15 MG/5ML syrup    Sig: Take 5 mLs by mouth 4 (four) times daily as needed.    Dispense:  118 mL    Refill:  0    Problem List Items Addressed This Visit      Unprioritized   Influenza    Flu test neg  Clinically -- sudden onset fever 100 and other symptoms with tylenol  Will tx with tamiflu      Relevant Medications   oseltamivir (TAMIFLU) 75 MG capsule   Pansinusitis - Primary    con't flonase and otc antihistamine Abx per orders  And cough meds       Relevant Medications   oseltamivir (TAMIFLU) 75 MG capsule   doxycycline (VIBRA-TABS) 100 MG tablet   promethazine-dextromethorphan (PROMETHAZINE-DM) 6.25-15 MG/5ML syrup    Other Visit Diagnoses    Sore throat       Relevant Medications   doxycycline (VIBRA-TABS) 100 MG tablet   Other Relevant Orders   POCT rapid strep A (Completed)   POCT Influenza A/B (Completed)   Cough       Relevant Medications   promethazine-dextromethorphan (PROMETHAZINE-DM) 6.25-15 MG/5ML syrup      Follow-up: Return if symptoms worsen or fail to improve.  Ann Held, DO

## 2018-12-24 ENCOUNTER — Telehealth: Payer: Self-pay | Admitting: *Deleted

## 2018-12-25 NOTE — Telephone Encounter (Signed)
error 

## 2018-12-26 ENCOUNTER — Telehealth: Payer: Self-pay | Admitting: *Deleted

## 2018-12-26 ENCOUNTER — Ambulatory Visit (INDEPENDENT_AMBULATORY_CARE_PROVIDER_SITE_OTHER): Payer: BLUE CROSS/BLUE SHIELD | Admitting: *Deleted

## 2018-12-26 ENCOUNTER — Other Ambulatory Visit: Payer: Self-pay | Admitting: Family Medicine

## 2018-12-26 DIAGNOSIS — E538 Deficiency of other specified B group vitamins: Secondary | ICD-10-CM

## 2018-12-26 DIAGNOSIS — R05 Cough: Secondary | ICD-10-CM

## 2018-12-26 DIAGNOSIS — R059 Cough, unspecified: Secondary | ICD-10-CM

## 2018-12-26 MED ORDER — HYDROCODONE-HOMATROPINE 5-1.5 MG/5ML PO SYRP: ORAL_SOLUTION | ORAL | 0 refills | Status: DC

## 2018-12-26 MED ORDER — CYANOCOBALAMIN 1000 MCG/ML IJ SOLN
1000.0000 ug | Freq: Once | INTRAMUSCULAR | Status: AC
  Administered 2018-12-26: 1000 ug via INTRAMUSCULAR

## 2018-12-26 NOTE — Telephone Encounter (Signed)
Hycodan sent

## 2018-12-26 NOTE — Telephone Encounter (Signed)
[facility] sent a fax.  Promethazine dm on backorder

## 2018-12-26 NOTE — Progress Notes (Signed)
Pt here for monthly B12 injection per   B12 1071mcg given IM, and pt tolerated injection well.  Next B12 injection scheduled for 01/30/19

## 2019-01-05 DIAGNOSIS — N978 Female infertility of other origin: Secondary | ICD-10-CM | POA: Diagnosis not present

## 2019-01-15 ENCOUNTER — Encounter (INDEPENDENT_AMBULATORY_CARE_PROVIDER_SITE_OTHER): Payer: Self-pay | Admitting: Physician Assistant

## 2019-01-30 ENCOUNTER — Ambulatory Visit: Payer: BLUE CROSS/BLUE SHIELD

## 2019-02-03 ENCOUNTER — Ambulatory Visit (INDEPENDENT_AMBULATORY_CARE_PROVIDER_SITE_OTHER): Payer: BLUE CROSS/BLUE SHIELD | Admitting: *Deleted

## 2019-02-03 ENCOUNTER — Other Ambulatory Visit: Payer: Self-pay

## 2019-02-03 DIAGNOSIS — E538 Deficiency of other specified B group vitamins: Secondary | ICD-10-CM | POA: Diagnosis not present

## 2019-02-03 MED ORDER — CYANOCOBALAMIN 1000 MCG/ML IJ SOLN
1000.0000 ug | Freq: Once | INTRAMUSCULAR | Status: AC
  Administered 2019-02-03: 1000 ug via INTRAMUSCULAR

## 2019-02-03 NOTE — Patient Instructions (Signed)
Pt here for monthly B12 injection per   B12 1070mcg given IM, and pt tolerated injection well  Patient will schedule next injection on her way out/SLS 04/14

## 2019-02-05 ENCOUNTER — Encounter (INDEPENDENT_AMBULATORY_CARE_PROVIDER_SITE_OTHER): Payer: Self-pay | Admitting: Family Medicine

## 2019-02-05 ENCOUNTER — Ambulatory Visit (INDEPENDENT_AMBULATORY_CARE_PROVIDER_SITE_OTHER): Payer: BLUE CROSS/BLUE SHIELD | Admitting: Family Medicine

## 2019-02-05 ENCOUNTER — Other Ambulatory Visit: Payer: Self-pay

## 2019-02-05 DIAGNOSIS — E8881 Metabolic syndrome: Secondary | ICD-10-CM | POA: Diagnosis not present

## 2019-02-05 DIAGNOSIS — E559 Vitamin D deficiency, unspecified: Secondary | ICD-10-CM

## 2019-02-05 DIAGNOSIS — Z6841 Body Mass Index (BMI) 40.0 and over, adult: Secondary | ICD-10-CM

## 2019-02-05 MED ORDER — VITAMIN D (ERGOCALCIFEROL) 1.25 MG (50000 UNIT) PO CAPS: 50000.0000 [IU] | ORAL_CAPSULE | ORAL | 0 refills | Status: DC

## 2019-02-09 ENCOUNTER — Encounter (INDEPENDENT_AMBULATORY_CARE_PROVIDER_SITE_OTHER): Payer: Self-pay | Admitting: Family Medicine

## 2019-02-09 NOTE — Progress Notes (Signed)
Office: [phone]  /  Fax: [phone] TeleHealth Visit:  An Lannan has verbally consented to this TeleHealth visit today. The patient is located at home, the provider is located at the [facility] office. The participants in this visit include the listed provider and patient. The visit was conducted today via webex.  HPI:   Chief Complaint: OBESITY Stacey Huerta is here to discuss her progress with her obesity treatment plan. She is on the keep a food journal with 1600 calories and 100 grams of protein daily and is following her eating plan approximately 10 % of the time. She states she is exercising 0 minutes 0 times per week. Stacey Huerta has not had a visit at our clinic since 11/13/2018. Her IVF visits have been cancelled due to [place] and her primary reason to lose weight to get pregnant. She has stopped working out because she cannot go to the gym. She has been off the meal plan. She is not eating healthy snacks. She is concerned because she must be 279 lbs to be a candidate for in vitro and she weights more that that currently.  We were unable to weigh the patient today for this TeleHealth visit. She feels as if she has gained 10 lbs since her last visit. She has lost 12 lbs since starting treatment with [place].  Vitamin D Deficiency Stacey Huerta has a diagnosis of vitamin D deficiency. She is not at goal and her last vit D was 40.9 on 10/07/18.  She is currently taking prescription Vit D. She denies nausea, vomiting or muscle weakness.  Insulin Resistance Stacey Huerta has a diagnosis of insulin resistance based on her elevated fasting insulin level >5. Although Stacey Huerta's blood glucose readings are still under good control, insulin resistance puts her at greater risk of metabolic syndrome and diabetes. She stopped metformin due to decreased efficiency of workouts and increase in resting heart rate. She also had increased fatigue. She is concerned that she may have had lactic acidosis.    ASSESSMENT AND PLAN:  Vitamin D deficiency - Plan: Vitamin D, Ergocalciferol, (DRISDOL) 1.25 MG (50000 UT) CAPS capsule  Insulin resistance  Class 3 severe obesity with serious comorbidity and body mass index (BMI) of 40.0 to 44.9 in adult, unspecified obesity type ([place])  PLAN:  Vitamin D Deficiency Stacey Huerta was informed that low vitamin D levels contributes to fatigue and are associated with obesity, breast, and colon cancer. [name] agrees to continue taking prescription Vit D @50 ,000 IU every week #12 with no refills. She will follow up for routine testing of vitamin D, at least 2-3 times per year. She was informed of the risk of over-replacement of vitamin D and agrees to not increase her dose unless she discusses this with [place] first. Stacey Huerta agrees to follow up with our clinic in 2 weeks.  Insulin Resistance Stacey Huerta will continue her meal plan, and will continue to work on weight loss, exercise, and decreasing simple carbohydrates in her diet to help decrease the risk of diabetes. We will discontinue metformin due to possibility that she may have been developing lactic acidosis.  Stacey Huerta agrees to follow up with our clinic in 2 weeks as directed to monitor her progress.  Obesity Stacey Huerta is currently in the action stage of change. As such, her goal is to continue with weight loss efforts She has agreed to keep a food journal with 1500-1600 calories and 100 grams of protein daily or follow the Category 3 plan [name] has been instructed to walk every  other day for weight loss, to improve insulin sensitivity,  and overall health benefits. We discussed the following Behavioral Modification Strategies today: increasing lean protein intake, decreasing simple carbohydrates, planning for success, and keep a strict food journal    Stacey Huerta has agreed to follow up with our clinic in 2 weeks. She was informed of the importance of frequent follow up visits to maximize her success with intensive  lifestyle modifications for her multiple health conditions.  ALLERGIES: Allergies  Allergen Reactions  . Eggs Or Egg-Derived Products   . Sulfonamide Derivatives Hives    Also high fever  . Ganoderma Lucidum   . [place]   . Other     Mushrooms and [place] Nuts  . Penicillins     Childhood reaction  . Shellfish Allergy Nausea And Vomiting  . Fish-Derived Products Rash    MEDICATIONS: Current Outpatient Medications on File Prior to Visit  Medication Sig Dispense Refill  . aspirin EC 81 MG tablet Take 1 tablet (81 mg total) by mouth daily.    . B Complex-C-Folic Acid (B-COMPLEX BALANCED PO) Take by mouth.    . calcium carbonate 200 MG capsule Take 250 mg by mouth 2 (two) times daily with a meal.    . doxycycline (VIBRA-TABS) 100 MG tablet Take 1 tablet (100 mg total) by mouth 2 (two) times daily. 20 tablet 0  . estradiol (ESTRACE) 2 MG tablet Take 2 mg by mouth daily.    Marland Kitchen HYDROcodone-homatropine (HYCODAN) 5-1.5 MG/5ML syrup 1 tsp po qhs prn 120 mL 0  . letrozole (FEMARA) 2.5 MG tablet Take 5 mg by mouth daily.    Marland Kitchen MAGNESIUM PO Take 1 tablet by mouth daily.    . metFORMIN (GLUCOPHAGE) 500 MG tablet Take 1 tablet (500 mg total) by mouth daily with breakfast. 90 tablet 0  . Nutritional Supplements (DHEA PO) Take 1 tablet by mouth daily.    . Omega-3 Fatty Acids (FISH OIL) 1000 MG CAPS Take by mouth.    . oseltamivir (TAMIFLU) 75 MG capsule Take 1 capsule (75 mg total) by mouth 2 (two) times daily. 10 capsule 0  . Prenatal Vit-Fe Fumarate-FA (PRENATAL VITAMIN PO) Take 1 tablet by mouth daily.    . Probiotic Product (PROBIOTIC-10 PO) Take by mouth.    . progesterone (PROMETRIUM) 200 MG capsule Take 200 mg by mouth 2 (two) times daily.    . promethazine-dextromethorphan (PROMETHAZINE-DM) 6.25-15 MG/5ML syrup Take 5 mLs by mouth 4 (four) times daily as needed. 118 mL 0  . pyridOXINE (VITAMIN B-6) 50 MG tablet Take 50 mg by mouth daily.    . SELENIUM PO Take 1 tablet by mouth daily.    Marland Kitchen  VITAMIN E PO Take 1 tablet by mouth daily.    Marland Kitchen zinc gluconate 50 MG tablet Take 50 mg by mouth daily.     No current facility-administered medications on file prior to visit.     PAST MEDICAL HISTORY: Past Medical History:  Diagnosis Date  . Allergy   . Back pain   . Dyspnea   . Fibroid   . Headache(784.0)   . Hypertension during pregnancy   . Infertility, female   . Leg edema   . Osteoarthritis   . Postpartum depression   . Sjogren's syndrome ([place]) 10/28/2015  . Vitamin B 12 deficiency   . Vitamin D deficiency     PAST SURGICAL HISTORY: Past Surgical History:  Procedure Laterality Date  . ABDOMINAL ADHESION SURGERY    . CESAREAN SECTION N/A  03/18/2014   Procedure: CESAREAN SECTION;  Surgeon: Delice Lesch, MD;  Location: [facility];  Service: Obstetrics;  Laterality: N/A;  spinal/epidural  . CHOLECYSTECTOMY    . ENDOMETRIAL ABLATION    . LYMPHADENECTOMY     Removed from the neck at age 55  . MYOMECTOMY    . MYOMECTOMY  05/23/2012   Procedure: MYOMECTOMY;  Surgeon: Governor Specking, MD;  Location: [facility];  Service: Gynecology;  Laterality: N/A;    SOCIAL HISTORY: Social History   Tobacco Use  . Smoking status: Never Smoker  . Smokeless tobacco: Never Used  Substance Use Topics  . Alcohol use: Yes    Alcohol/week: 0.0 standard drinks    Comment: socially  . Drug use: No    FAMILY HISTORY: Family History  Problem Relation Age of Onset  . Hypertension Paternal Grandfather   . Diabetes Paternal Grandfather   . Diabetes Father   . Hypotension Father   . Colon cancer Paternal Grandmother   . Diabetes Paternal Grandmother   . Prostate cancer Maternal Grandfather   . Hyperlipidemia Mother   . Obesity Mother   . Breast cancer Maternal Aunt   . Arthritis Paternal Aunt   . Hyperlipidemia Maternal Aunt        x's 2    ROS: Review of Systems  Constitutional: Positive for malaise/fatigue. Negative for weight loss.  Gastrointestinal: Negative for nausea and  vomiting.  Musculoskeletal:       Negative muscle weakness    PHYSICAL EXAM: Pt in no acute distress  RECENT LABS AND TESTS: BMET    Component Value Date/Time   NA 136 10/07/2018 1149   K 4.0 10/07/2018 1149   CL 101 10/07/2018 1149   CO2 22 10/07/2018 1149   GLUCOSE 71 10/07/2018 1149   GLUCOSE 104 (H) 05/02/2018 1216   BUN 10 10/07/2018 1149   CREATININE 0.89 10/07/2018 1149   CREATININE 0.98 08/19/2014 1223   CALCIUM 9.3 10/07/2018 1149   GFRNONAA 79 10/07/2018 1149   GFRAA 91 10/07/2018 1149   Lab Results  Component Value Date   HGBA1C 4.9 10/07/2018   HGBA1C 5.0 06/26/2018   HGBA1C 5.2 12/19/2015   HGBA1C 5.2 05/31/2008   Lab Results  Component Value Date   INSULIN 10.5 10/07/2018   INSULIN 15.4 06/26/2018   CBC    Component Value Date/Time   WBC 4.4 06/26/2018 1130   WBC 4.1 05/02/2018 1216   RBC 4.38 06/26/2018 1130   RBC 4.23 05/02/2018 1216   HGB 13.6 06/26/2018 1130   HCT 41.1 06/26/2018 1130   PLT 182.0 05/02/2018 1216   MCV 94 06/26/2018 1130   MCH 31.1 06/26/2018 1130   MCH 30.0 08/19/2014 1226   MCH 32.4 03/20/2014 0812   MCHC 33.1 06/26/2018 1130   MCHC 33.6 05/02/2018 1216   RDW 12.0 (L) 06/26/2018 1130   LYMPHSABS 1.3 06/26/2018 1130   MONOABS 0.3 05/02/2018 1216   EOSABS 0.0 06/26/2018 1130   BASOSABS 0.0 06/26/2018 1130   Iron/TIBC/Ferritin/ %Sat No results found for: IRON, TIBC, FERRITIN, IRONPCTSAT Lipid Panel     Component Value Date/Time   CHOL 168 10/07/2018 1149   TRIG 106 10/07/2018 1149   HDL 50 10/07/2018 1149   CHOLHDL 4 12/19/2015 0852   VLDL 20.2 12/19/2015 0852   LDLCALC 97 10/07/2018 1149   Hepatic Function Panel     Component Value Date/Time   PROT 7.4 10/07/2018 1149   ALBUMIN 4.1 10/07/2018 1149   AST 24 10/07/2018 1149  ALT 20 10/07/2018 1149   ALKPHOS 69 10/07/2018 1149   BILITOT 1.1 10/07/2018 1149   BILIDIR 0.1 12/14/2014 1402   IBILI 0.9 04/30/2008 2117      Component Value Date/Time   TSH  2.420 06/26/2018 1130   TSH 1.59 05/02/2018 1216   TSH 1.34 12/14/2014 1402      I, Trixie Dredge, am acting as Location manager for [employer], FNP-C  I have reviewed the above documentation for accuracy and completeness, and I agree with the above.  - Nikelle Malatesta, FNP-C.

## 2019-02-12 DIAGNOSIS — Z1159 Encounter for screening for other viral diseases: Secondary | ICD-10-CM | POA: Diagnosis not present

## 2019-02-24 ENCOUNTER — Encounter (INDEPENDENT_AMBULATORY_CARE_PROVIDER_SITE_OTHER): Payer: Self-pay | Admitting: Physician Assistant

## 2019-02-24 ENCOUNTER — Other Ambulatory Visit: Payer: Self-pay

## 2019-02-24 ENCOUNTER — Ambulatory Visit (INDEPENDENT_AMBULATORY_CARE_PROVIDER_SITE_OTHER): Payer: BLUE CROSS/BLUE SHIELD | Admitting: Physician Assistant

## 2019-02-24 DIAGNOSIS — E88819 Insulin resistance, unspecified: Secondary | ICD-10-CM

## 2019-02-24 DIAGNOSIS — Z6841 Body Mass Index (BMI) 40.0 and over, adult: Secondary | ICD-10-CM

## 2019-02-24 DIAGNOSIS — E66813 Obesity, class 3: Secondary | ICD-10-CM

## 2019-02-24 DIAGNOSIS — E8881 Metabolic syndrome: Secondary | ICD-10-CM | POA: Diagnosis not present

## 2019-02-24 DIAGNOSIS — E559 Vitamin D deficiency, unspecified: Secondary | ICD-10-CM

## 2019-02-24 MED ORDER — METFORMIN HCL ER 500 MG PO TB24: 500.0000 mg | ORAL_TABLET | Freq: Every day | ORAL | 0 refills | Status: DC

## 2019-02-24 NOTE — Progress Notes (Signed)
Office: [phone]  /  Fax: [phone] TeleHealth Visit:  Stacey Huerta has verbally consented to this TeleHealth visit today. The patient is located at home, the provider is located at the [employer] [facility] office. The participants in this visit include the listed provider and patient. The visit was conducted today via Webex.  HPI:   Chief Complaint: OBESITY Stacey Huerta is here to discuss her progress with her obesity treatment plan. She is on the Category 3 plan or journaling 1500-1600 calories + 100 grams of protein daily and is following her eating plan approximately 75% of the time. She states she is exercising 0 minutes 0 times per week. Stacey Huerta reports that she has been off track with the plan since not be able to go to her OB/GYN for fertility treatments and not being able to work out. She notes excessive hunger throughout the day since stopping regular release metformin due to side effects.  We were unable to weigh the patient today for this TeleHealth visit. She feels as if she has gained 6 lbs since her last visit. She has lost 12 lbs since starting treatment with [place].  Insulin Resistance Stacey Huerta has a diagnosis of insulin resistance based on her elevated fasting insulin level >5. Although Stacey Huerta's blood glucose readings are still under good control, insulin resistance puts her at greater risk of metabolic syndrome and diabetes. She discontinued regular metformin on her own due to stress eating but now notes excessive polyphagia.  Vitamin D deficiency Stacey Huerta has a diagnosis of Vitamin D deficiency. She is currently taking Vit D and denies nausea, vomiting or muscle weakness.  ASSESSMENT AND PLAN:  Insulin resistance - Plan: Hemoglobin A1c, Insulin, random, Comprehensive metabolic panel, metFORMIN (GLUCOPHAGE-XR) 500 MG 24 hr tablet  Vitamin D deficiency - Plan: VITAMIN D 25 Hydroxy (Vit-D Deficiency, Fractures)  Class 3 severe obesity with serious comorbidity and  body mass index (BMI) of 40.0 to 44.9 in adult, unspecified obesity type ([place])  PLAN:  Insulin Resistance [name] will continue to work on weight loss, exercise, and decreasing simple carbohydrates in her diet to help decrease the risk of diabetes. We dicussed metformin including benefits and risks. She was informed that eating too many simple carbohydrates or too many calories at one sitting increases the likelihood of GI side effects. [name] will start metformin 500 mg ER #30 with 0 refills. She agrees to follow-up with our clinic in 2 weeks.  Vitamin D Deficiency Stacey Huerta was informed that low Vitamin D levels contributes to fatigue and are associated with obesity, breast, and colon cancer. She agrees to continue taking Vit D and will follow-up for routine testing of Vitamin D. She was informed of the risk of over-replacement of Vitamin D and agrees to not increase her dose unless she discusses this with [place] first. Stacey Huerta agrees to follow-up with our clinic in 2 weeks.  Obesity Stacey Huerta is currently in the action stage of change. As such, her goal is to continue with weight loss efforts. She has agreed to keep a food journal with 1500 calories and 100 protein daily. [name] has been instructed to begin walking/strength training for weight loss and overall health benefits. We discussed the following Behavioral Modification Strategies today: increasing lean protein intake and no skipping meals.  Stacey Huerta has agreed to follow-up with our clinic in 2 weeks. She was informed of the importance of frequent follow-up visits to maximize her success with intensive lifestyle modifications for her multiple health conditions.  ALLERGIES: Allergies  Allergen  Reactions  . Eggs Or Egg-Derived Products   . Metformin And Related     Possible lactic acidosis  . Sulfonamide Derivatives Hives    Also high fever  . Ganoderma Lucidum   . [place]   . Other     Mushrooms and [place] Nuts  . Penicillins      Childhood reaction  . Shellfish Allergy Nausea And Vomiting  . Fish-Derived Products Rash    MEDICATIONS: Current Outpatient Medications on File Prior to Visit  Medication Sig Dispense Refill  . aspirin EC 81 MG tablet Take 1 tablet (81 mg total) by mouth daily.    . B Complex-C-Folic Acid (B-COMPLEX BALANCED PO) Take by mouth.    . calcium carbonate 200 MG capsule Take 250 mg by mouth 2 (two) times daily with a meal.    . doxycycline (VIBRA-TABS) 100 MG tablet Take 1 tablet (100 mg total) by mouth 2 (two) times daily. 20 tablet 0  . estradiol (ESTRACE) 2 MG tablet Take 2 mg by mouth daily.    Marland Kitchen HYDROcodone-homatropine (HYCODAN) 5-1.5 MG/5ML syrup 1 tsp po qhs prn 120 mL 0  . letrozole (FEMARA) 2.5 MG tablet Take 5 mg by mouth daily.    Marland Kitchen MAGNESIUM PO Take 1 tablet by mouth daily.    . Nutritional Supplements (DHEA PO) Take 1 tablet by mouth daily.    . Omega-3 Fatty Acids (FISH OIL) 1000 MG CAPS Take by mouth.    . oseltamivir (TAMIFLU) 75 MG capsule Take 1 capsule (75 mg total) by mouth 2 (two) times daily. 10 capsule 0  . Prenatal Vit-Fe Fumarate-FA (PRENATAL VITAMIN PO) Take 1 tablet by mouth daily.    . Probiotic Product (PROBIOTIC-10 PO) Take by mouth.    . progesterone (PROMETRIUM) 200 MG capsule Take 200 mg by mouth 2 (two) times daily.    . promethazine-dextromethorphan (PROMETHAZINE-DM) 6.25-15 MG/5ML syrup Take 5 mLs by mouth 4 (four) times daily as needed. 118 mL 0  . pyridOXINE (VITAMIN B-6) 50 MG tablet Take 50 mg by mouth daily.    . SELENIUM PO Take 1 tablet by mouth daily.    . Vitamin D, Ergocalciferol, (DRISDOL) 1.25 MG (50000 UT) CAPS capsule Take 1 capsule (50,000 Units total) by mouth every 7 (seven) days. 12 capsule 0  . VITAMIN E PO Take 1 tablet by mouth daily.    Marland Kitchen zinc gluconate 50 MG tablet Take 50 mg by mouth daily.     No current facility-administered medications on file prior to visit.     PAST MEDICAL HISTORY: Past Medical History:  Diagnosis Date  .  Allergy   . Back pain   . Dyspnea   . Fibroid   . Headache(784.0)   . Hypertension during pregnancy   . Infertility, female   . Leg edema   . Osteoarthritis   . Postpartum depression   . Sjogren's syndrome ([place]) 10/28/2015  . Vitamin B 12 deficiency   . Vitamin D deficiency     PAST SURGICAL HISTORY: Past Surgical History:  Procedure Laterality Date  . ABDOMINAL ADHESION SURGERY    . CESAREAN SECTION N/A 03/18/2014   Procedure: CESAREAN SECTION;  Surgeon: Delice Lesch, MD;  Location: [facility];  Service: Obstetrics;  Laterality: N/A;  spinal/epidural  . CHOLECYSTECTOMY    . ENDOMETRIAL ABLATION    . LYMPHADENECTOMY     Removed from the neck at age 76  . MYOMECTOMY    . MYOMECTOMY  05/23/2012   Procedure: MYOMECTOMY;  Surgeon: Governor Specking, MD;  Location: [facility];  Service: Gynecology;  Laterality: N/A;    SOCIAL HISTORY: Social History   Tobacco Use  . Smoking status: Never Smoker  . Smokeless tobacco: Never Used  Substance Use Topics  . Alcohol use: Yes    Alcohol/week: 0.0 standard drinks    Comment: socially  . Drug use: No    FAMILY HISTORY: Family History  Problem Relation Age of Onset  . Hypertension Paternal Grandfather   . Diabetes Paternal Grandfather   . Diabetes Father   . Hypotension Father   . Colon cancer Paternal Grandmother   . Diabetes Paternal Grandmother   . Prostate cancer Maternal Grandfather   . Hyperlipidemia Mother   . Obesity Mother   . Breast cancer Maternal Aunt   . Arthritis Paternal Aunt   . Hyperlipidemia Maternal Aunt        x's 2   ROS: Review of Systems  Gastrointestinal: Negative for nausea and vomiting.  Musculoskeletal:       Negative for muscle weakness.  Endo/Heme/Allergies:       Positive for excessive polyphagia.   PHYSICAL EXAM: Pt in no acute distress  RECENT LABS AND TESTS: BMET    Component Value Date/Time   NA 136 10/07/2018 1149   K 4.0 10/07/2018 1149   CL 101 10/07/2018 1149   CO2 22  10/07/2018 1149   GLUCOSE 71 10/07/2018 1149   GLUCOSE 104 (H) 05/02/2018 1216   BUN 10 10/07/2018 1149   CREATININE 0.89 10/07/2018 1149   CREATININE 0.98 08/19/2014 1223   CALCIUM 9.3 10/07/2018 1149   GFRNONAA 79 10/07/2018 1149   GFRAA 91 10/07/2018 1149   Lab Results  Component Value Date   HGBA1C 4.9 10/07/2018   HGBA1C 5.0 06/26/2018   HGBA1C 5.2 12/19/2015   HGBA1C 5.2 05/31/2008   Lab Results  Component Value Date   INSULIN 10.5 10/07/2018   INSULIN 15.4 06/26/2018   CBC    Component Value Date/Time   WBC 4.4 06/26/2018 1130   WBC 4.1 05/02/2018 1216   RBC 4.38 06/26/2018 1130   RBC 4.23 05/02/2018 1216   HGB 13.6 06/26/2018 1130   HCT 41.1 06/26/2018 1130   PLT 182.0 05/02/2018 1216   MCV 94 06/26/2018 1130   MCH 31.1 06/26/2018 1130   MCH 30.0 08/19/2014 1226   MCH 32.4 03/20/2014 0812   MCHC 33.1 06/26/2018 1130   MCHC 33.6 05/02/2018 1216   RDW 12.0 (L) 06/26/2018 1130   LYMPHSABS 1.3 06/26/2018 1130   MONOABS 0.3 05/02/2018 1216   EOSABS 0.0 06/26/2018 1130   BASOSABS 0.0 06/26/2018 1130   Iron/TIBC/Ferritin/ %Sat No results found for: IRON, TIBC, FERRITIN, IRONPCTSAT Lipid Panel     Component Value Date/Time   CHOL 168 10/07/2018 1149   TRIG 106 10/07/2018 1149   HDL 50 10/07/2018 1149   CHOLHDL 4 12/19/2015 0852   VLDL 20.2 12/19/2015 0852   LDLCALC 97 10/07/2018 1149   Hepatic Function Panel     Component Value Date/Time   PROT 7.4 10/07/2018 1149   ALBUMIN 4.1 10/07/2018 1149   AST 24 10/07/2018 1149   ALT 20 10/07/2018 1149   ALKPHOS 69 10/07/2018 1149   BILITOT 1.1 10/07/2018 1149   BILIDIR 0.1 12/14/2014 1402   IBILI 0.9 04/30/2008 2117      Component Value Date/Time   TSH 2.420 06/26/2018 1130   TSH 1.59 05/02/2018 1216   TSH 1.34 12/14/2014 1402   Results for Gwynneth Munson  AVA (MRN [id number]) as of 02/24/2019 14:45  Ref. Range 10/07/2018 11:49  Vitamin D, 25-Hydroxy Latest Ref Range: 30.0 - 100.0 ng/mL 40.9   I,  Michaelene Song, am acting as Location manager for [employer], PA-C I, Abby Potash, PA-C have reviewed above note and agree with its content

## 2019-03-05 DIAGNOSIS — E559 Vitamin D deficiency, unspecified: Secondary | ICD-10-CM | POA: Diagnosis not present

## 2019-03-05 DIAGNOSIS — E8881 Metabolic syndrome: Secondary | ICD-10-CM | POA: Diagnosis not present

## 2019-03-06 ENCOUNTER — Ambulatory Visit: Payer: BLUE CROSS/BLUE SHIELD

## 2019-03-06 LAB — COMPREHENSIVE METABOLIC PANEL
ALT: 11 IU/L (ref 0–32)
AST: 13 IU/L (ref 0–40)
Albumin/Globulin Ratio: 1.1 — ABNORMAL LOW (ref 1.2–2.2)
Albumin: 3.7 g/dL — ABNORMAL LOW (ref 3.8–4.8)
Alkaline Phosphatase: 105 IU/L (ref 39–117)
BUN/Creatinine Ratio: 14 (ref 9–23)
BUN: 12 mg/dL (ref 6–24)
Bilirubin Total: 0.6 mg/dL (ref 0.0–1.2)
CO2: 22 mmol/L (ref 20–29)
Calcium: 9.1 mg/dL (ref 8.7–10.2)
Chloride: 104 mmol/L (ref 96–106)
Creatinine, Ser: 0.87 mg/dL (ref 0.57–1.00)
GFR calc Af Amer: 94 mL/min/{1.73_m2} (ref >59)
GFR calc non Af Amer: 81 mL/min/{1.73_m2} (ref >59)
Globulin, Total: 3.4 g/dL (ref 1.5–4.5)
Glucose: 82 mg/dL (ref 65–99)
Potassium: 4.5 mmol/L (ref 3.5–5.2)
Sodium: 139 mmol/L (ref 134–144)
Total Protein: 7.1 g/dL (ref 6.0–8.5)

## 2019-03-06 LAB — INSULIN, RANDOM: INSULIN: 21.8 u[IU]/mL (ref 2.6–24.9)

## 2019-03-06 LAB — VITAMIN D 25 HYDROXY (VIT D DEFICIENCY, FRACTURES): Vit D, 25-Hydroxy: 53.5 ng/mL (ref 30.0–100.0)

## 2019-03-06 LAB — HEMOGLOBIN A1C
Est. average glucose Bld gHb Est-mCnc: 97 mg/dL
Hgb A1c MFr Bld: 5 % (ref 4.8–5.6)

## 2019-03-11 ENCOUNTER — Other Ambulatory Visit: Payer: Self-pay

## 2019-03-11 ENCOUNTER — Encounter (INDEPENDENT_AMBULATORY_CARE_PROVIDER_SITE_OTHER): Payer: Self-pay | Admitting: Physician Assistant

## 2019-03-11 ENCOUNTER — Ambulatory Visit (INDEPENDENT_AMBULATORY_CARE_PROVIDER_SITE_OTHER): Payer: BLUE CROSS/BLUE SHIELD | Admitting: Physician Assistant

## 2019-03-11 DIAGNOSIS — E559 Vitamin D deficiency, unspecified: Secondary | ICD-10-CM | POA: Diagnosis not present

## 2019-03-11 DIAGNOSIS — Z6841 Body Mass Index (BMI) 40.0 and over, adult: Secondary | ICD-10-CM | POA: Diagnosis not present

## 2019-03-11 DIAGNOSIS — E8881 Metabolic syndrome: Secondary | ICD-10-CM | POA: Diagnosis not present

## 2019-03-11 MED ORDER — VITAMIN D (ERGOCALCIFEROL) 1.25 MG (50000 UNIT) PO CAPS: 50000.0000 [IU] | ORAL_CAPSULE | ORAL | 0 refills | Status: DC

## 2019-03-11 MED ORDER — METFORMIN HCL ER 500 MG PO TB24: 500.0000 mg | ORAL_TABLET | Freq: Every day | ORAL | 1 refills | Status: DC

## 2019-03-11 NOTE — Progress Notes (Signed)
Office: [phone]  /  Fax: [phone] TeleHealth Visit:  Stacey Huerta has verbally consented to this TeleHealth visit today. The patient is located at home, the provider is located at the [employer] [facility] office. The participants in this visit include the listed provider and patient. The visit was conducted today via Webex.  HPI:   Chief Complaint: OBESITY Stacey Huerta is here to discuss her progress with her obesity treatment plan. She is keeping a food journal with 1500 calories and 100 grams of protein daily and is following her eating plan approximately 95% of the time. She states she is doing cardio/weights 60 minutes 4 times per week. Stacey Huerta reports doing much better with her plan the last few weeks. She is now 283 lbs. She is working out 5 days a week on [employer] with a Clinical research associate. Her hunger is much better controlled on metformin ER. We were unable to weigh the patient today for this TeleHealth visit. She feels as if she has lost 3 lbs since her last visit. She has lost 12 lbs since starting treatment with [place].  Insulin Resistance Stacey Huerta has a diagnosis of insulin resistance based on her elevated fasting insulin level >5. Although [name]'s blood glucose readings are still under good control, insulin resistance puts her at greater risk of metabolic syndrome and diabetes. She is taking metformin currently and continues to work on diet and exercise to decrease risk of diabetes. Stacey Huerta reports her polyphagia is much improved on metformin ER. No hypoglycemia. No nausea, vomiting, or diarrhea.  Vitamin D deficiency Stacey Huerta has a diagnosis of Vitamin D deficiency. She is currently taking prescription Vit D and denies nausea, vomiting or muscle weakness.  ASSESSMENT AND PLAN:  Insulin resistance - Plan: metFORMIN (GLUCOPHAGE-XR) 500 MG 24 hr tablet  Vitamin D deficiency - Plan: Vitamin D, Ergocalciferol, (DRISDOL) 1.25 MG (50000 UT) CAPS capsule  Class 3 severe obesity with  serious comorbidity and body mass index (BMI) of 40.0 to 44.9 in adult, unspecified obesity type ([place])  PLAN:  Insulin Resistance Stacey Huerta will continue to work on weight loss, exercise, and decreasing simple carbohydrates in her diet to help decrease the risk of diabetes. We dicussed metformin including benefits and risks. She was informed that eating too many simple carbohydrates or too many calories at one sitting increases the likelihood of GI side effects. Kiauna was given a refill on her metformin #30 with 1 refill and she agrees to follow-up with our clinic in 2 weeks.  Vitamin D Deficiency Stacey Huerta was informed that low Vitamin D levels contributes to fatigue and are associated with obesity, breast, and colon cancer. She agrees to continue to take prescription Vit D @ 50,000 IU every week #4 with 0 refills and will follow-up for routine testing of Vitamin D, at least 2-3 times per year. She was informed of the risk of over-replacement of Vitamin D and agrees to not increase her dose unless she discusses this with [place] first. Stacey Huerta agrees to follow-up with our clinic in 2 weeks.  Obesity Stacey Huerta is currently in the action stage of change. As such, her goal is to continue with weight loss efforts. She has agreed to keep a food journal with 1500 calories and 100 grams of protein daily. Stacey Huerta has been instructed to work up to a goal of 150 minutes of combined cardio and strengthening exercise per week for weight loss and overall health benefits. We discussed the following Behavioral Modification Strategies today: increasing lean protein intake, work on meal  planning and easy cooking plans.  Stacey Huerta has agreed to follow-up with our clinic in 2 weeks. She was informed of the importance of frequent follow-up visits to maximize her success with intensive lifestyle modifications for her multiple health conditions.  ALLERGIES: Allergies  Allergen Reactions  . Eggs Or Egg-Derived Products   .  Metformin And Related     Possible lactic acidosis  . Sulfonamide Derivatives Hives    Also high fever  . Ganoderma Lucidum   . [place]   . Other     Mushrooms and [place] Nuts  . Penicillins     Childhood reaction  . Shellfish Allergy Nausea And Vomiting  . Fish-Derived Products Rash    MEDICATIONS: Current Outpatient Medications on File Prior to Visit  Medication Sig Dispense Refill  . Cholecalciferol (VITAMIN D) 125 MCG (5000 UT) CAPS Take 1 capsule by mouth daily.    Marland Kitchen aspirin EC 81 MG tablet Take 1 tablet (81 mg total) by mouth daily.    . B Complex-C-Folic Acid (B-COMPLEX BALANCED PO) Take by mouth.    . calcium carbonate 200 MG capsule Take 250 mg by mouth 2 (two) times daily with a meal.    . doxycycline (VIBRA-TABS) 100 MG tablet Take 1 tablet (100 mg total) by mouth 2 (two) times daily. 20 tablet 0  . estradiol (ESTRACE) 2 MG tablet Take 2 mg by mouth daily.    Marland Kitchen HYDROcodone-homatropine (HYCODAN) 5-1.5 MG/5ML syrup 1 tsp po qhs prn 120 mL 0  . letrozole (FEMARA) 2.5 MG tablet Take 5 mg by mouth daily.    Marland Kitchen MAGNESIUM PO Take 1 tablet by mouth daily.    . Nutritional Supplements (DHEA PO) Take 1 tablet by mouth daily.    . Omega-3 Fatty Acids (FISH OIL) 1000 MG CAPS Take by mouth.    . oseltamivir (TAMIFLU) 75 MG capsule Take 1 capsule (75 mg total) by mouth 2 (two) times daily. 10 capsule 0  . Prenatal Vit-Fe Fumarate-FA (PRENATAL VITAMIN PO) Take 1 tablet by mouth daily.    . Probiotic Product (PROBIOTIC-10 PO) Take by mouth.    . progesterone (PROMETRIUM) 200 MG capsule Take 200 mg by mouth 2 (two) times daily.    . promethazine-dextromethorphan (PROMETHAZINE-DM) 6.25-15 MG/5ML syrup Take 5 mLs by mouth 4 (four) times daily as needed. 118 mL 0  . pyridOXINE (VITAMIN B-6) 50 MG tablet Take 50 mg by mouth daily.    . SELENIUM PO Take 1 tablet by mouth daily.    Marland Kitchen VITAMIN E PO Take 1 tablet by mouth daily.    Marland Kitchen zinc gluconate 50 MG tablet Take 50 mg by mouth daily.     No  current facility-administered medications on file prior to visit.     PAST MEDICAL HISTORY: Past Medical History:  Diagnosis Date  . Allergy   . Back pain   . Dyspnea   . Fibroid   . Headache(784.0)   . Hypertension during pregnancy   . Infertility, female   . Leg edema   . Osteoarthritis   . Postpartum depression   . Sjogren's syndrome ([place]) 10/28/2015  . Vitamin B 12 deficiency   . Vitamin D deficiency     PAST SURGICAL HISTORY: Past Surgical History:  Procedure Laterality Date  . ABDOMINAL ADHESION SURGERY    . CESAREAN SECTION N/A 03/18/2014   Procedure: CESAREAN SECTION;  Surgeon: Delice Lesch, MD;  Location: [facility];  Service: Obstetrics;  Laterality: N/A;  spinal/epidural  . CHOLECYSTECTOMY    .  ENDOMETRIAL ABLATION    . LYMPHADENECTOMY     Removed from the neck at age 58  . MYOMECTOMY    . MYOMECTOMY  05/23/2012   Procedure: MYOMECTOMY;  Surgeon: Governor Specking, MD;  Location: [facility];  Service: Gynecology;  Laterality: N/A;    SOCIAL HISTORY: Social History   Tobacco Use  . Smoking status: Never Smoker  . Smokeless tobacco: Never Used  Substance Use Topics  . Alcohol use: Yes    Alcohol/week: 0.0 standard drinks    Comment: socially  . Drug use: No    FAMILY HISTORY: Family History  Problem Relation Age of Onset  . Hypertension Paternal Grandfather   . Diabetes Paternal Grandfather   . Diabetes Father   . Hypotension Father   . Colon cancer Paternal Grandmother   . Diabetes Paternal Grandmother   . Prostate cancer Maternal Grandfather   . Hyperlipidemia Mother   . Obesity Mother   . Breast cancer Maternal Aunt   . Arthritis Paternal Aunt   . Hyperlipidemia Maternal Aunt        x's 2   ROS: Review of Systems  Gastrointestinal: Negative for diarrhea, nausea and vomiting.  Musculoskeletal:       Negative for muscle weakness.  Endo/Heme/Allergies:       Negative for hypoglycemia.   PHYSICAL EXAM: Pt in no acute distress  RECENT LABS AND  TESTS: BMET    Component Value Date/Time   NA 139 03/05/2019 1003   K 4.5 03/05/2019 1003   CL 104 03/05/2019 1003   CO2 22 03/05/2019 1003   GLUCOSE 82 03/05/2019 1003   GLUCOSE 104 (H) 05/02/2018 1216   BUN 12 03/05/2019 1003   CREATININE 0.87 03/05/2019 1003   CREATININE 0.98 08/19/2014 1223   CALCIUM 9.1 03/05/2019 1003   GFRNONAA 81 03/05/2019 1003   GFRAA 94 03/05/2019 1003   Lab Results  Component Value Date   HGBA1C 5.0 03/05/2019   HGBA1C 4.9 10/07/2018   HGBA1C 5.0 06/26/2018   HGBA1C 5.2 12/19/2015   HGBA1C 5.2 05/31/2008   Lab Results  Component Value Date   INSULIN 21.8 03/05/2019   INSULIN 10.5 10/07/2018   INSULIN 15.4 06/26/2018   CBC    Component Value Date/Time   WBC 4.4 06/26/2018 1130   WBC 4.1 05/02/2018 1216   RBC 4.38 06/26/2018 1130   RBC 4.23 05/02/2018 1216   HGB 13.6 06/26/2018 1130   HCT 41.1 06/26/2018 1130   PLT 182.0 05/02/2018 1216   MCV 94 06/26/2018 1130   MCH 31.1 06/26/2018 1130   MCH 30.0 08/19/2014 1226   MCH 32.4 03/20/2014 0812   MCHC 33.1 06/26/2018 1130   MCHC 33.6 05/02/2018 1216   RDW 12.0 (L) 06/26/2018 1130   LYMPHSABS 1.3 06/26/2018 1130   MONOABS 0.3 05/02/2018 1216   EOSABS 0.0 06/26/2018 1130   BASOSABS 0.0 06/26/2018 1130   Iron/TIBC/Ferritin/ %Sat No results found for: IRON, TIBC, FERRITIN, IRONPCTSAT Lipid Panel     Component Value Date/Time   CHOL 168 10/07/2018 1149   TRIG 106 10/07/2018 1149   HDL 50 10/07/2018 1149   CHOLHDL 4 12/19/2015 0852   VLDL 20.2 12/19/2015 0852   LDLCALC 97 10/07/2018 1149   Hepatic Function Panel     Component Value Date/Time   PROT 7.1 03/05/2019 1003   ALBUMIN 3.7 (L) 03/05/2019 1003   AST 13 03/05/2019 1003   ALT 11 03/05/2019 1003   ALKPHOS 105 03/05/2019 1003   BILITOT 0.6 03/05/2019 1003  BILIDIR 0.1 12/14/2014 1402   IBILI 0.9 04/30/2008 2117      Component Value Date/Time   TSH 2.420 06/26/2018 1130   TSH 1.59 05/02/2018 1216   TSH 1.34  12/14/2014 1402   Results for TALITA, RECHT (MRN [id number]) as of 03/11/2019 14:59  Ref. Range 03/05/2019 10:03  Vitamin D, 25-Hydroxy Latest Ref Range: 30.0 - 100.0 ng/mL 53.5    I, Michaelene Song, am acting as Location manager for [employer], PA-C I, Abby Potash, PA-C have reviewed above note and agree with its content

## 2019-03-20 ENCOUNTER — Other Ambulatory Visit (INDEPENDENT_AMBULATORY_CARE_PROVIDER_SITE_OTHER): Payer: Self-pay | Admitting: Physician Assistant

## 2019-03-20 DIAGNOSIS — E8881 Metabolic syndrome: Secondary | ICD-10-CM

## 2019-03-25 ENCOUNTER — Other Ambulatory Visit (INDEPENDENT_AMBULATORY_CARE_PROVIDER_SITE_OTHER): Payer: Self-pay | Admitting: Physician Assistant

## 2019-03-25 ENCOUNTER — Other Ambulatory Visit: Payer: Self-pay

## 2019-03-25 ENCOUNTER — Encounter (INDEPENDENT_AMBULATORY_CARE_PROVIDER_SITE_OTHER): Payer: Self-pay | Admitting: Physician Assistant

## 2019-03-25 ENCOUNTER — Ambulatory Visit (INDEPENDENT_AMBULATORY_CARE_PROVIDER_SITE_OTHER): Payer: BLUE CROSS/BLUE SHIELD | Admitting: Physician Assistant

## 2019-03-25 DIAGNOSIS — E559 Vitamin D deficiency, unspecified: Secondary | ICD-10-CM

## 2019-03-25 DIAGNOSIS — E8881 Metabolic syndrome: Secondary | ICD-10-CM

## 2019-03-25 DIAGNOSIS — Z6841 Body Mass Index (BMI) 40.0 and over, adult: Secondary | ICD-10-CM

## 2019-03-25 MED ORDER — VITAMIN D (ERGOCALCIFEROL) 1.25 MG (50000 UNIT) PO CAPS: 50000.0000 [IU] | ORAL_CAPSULE | ORAL | 0 refills | Status: DC

## 2019-03-25 NOTE — Progress Notes (Signed)
Office: [phone]  /  Fax: [phone] TeleHealth Visit:  Stacey Huerta has verbally consented to this TeleHealth visit today. The patient is located in her car, the provider is located at the [facility] office. The participants in this visit include the listed provider, patient, and the patient's boyfriend. The visit was conducted today via Webex.  HPI:   Chief Complaint: OBESITY Stacey Huerta is here to discuss her progress with her obesity treatment plan. She is keeping a food journal with 1500 calories and 100 grams of protein daily and is following her eating plan approximately 95% of the time. She states she is doing high intensity training 3 times per week. Stacey Huerta reports that she is stuck at 283 lbs despite following the plan closely. She is working out hard 3-4 days a week and sometimes walking on the other days.  We were unable to weigh the patient today for this TeleHealth visit. She feels as if she has maintained her weight since her last visit. She has lost 12 lbs since starting treatment with [place].  Vitamin D deficiency Stacey Huerta has a diagnosis of Vitamin D deficiency. She is currently taking prescription Vit D and denies nausea, vomiting or muscle weakness.  Insulin Resistance Stacey Huerta has a diagnosis of insulin resistance based on her elevated fasting insulin level >5. Although Stacey Huerta's blood glucose readings are still under good control, insulin resistance puts her at greater risk of metabolic syndrome and diabetes. She is taking metformin currently and continues to work on diet and exercise to decrease risk of diabetes. No nausea, vomiting, or diarrhea. No polyphagia.  ASSESSMENT AND PLAN:  Vitamin D deficiency - Plan: Vitamin D, Ergocalciferol, (DRISDOL) 1.25 MG (50000 UT) CAPS capsule  Insulin resistance  Class 3 severe obesity with serious comorbidity and body mass index (BMI) of 40.0 to 44.9 in adult, unspecified obesity type ([place])  PLAN:   Vitamin D Deficiency Stacey Huerta was informed that low Vitamin D levels contributes to fatigue and are associated with obesity, breast, and colon cancer. She agrees to continue to take prescription Vit D @ 50,000 IU every week #4 with 0 refills and will follow-up for routine testing of Vitamin D, at least 2-3 times per year. She was informed of the risk of over-replacement of Vitamin D and agrees to not increase her dose unless she discusses this with [place] first. Stacey Huerta agrees to follow-up with our clinic in 2 weeks.  Insulin Resistance Stacey Huerta will continue to work on weight loss, exercise, and decreasing simple carbohydrates in her diet to help decrease the risk of diabetes. We dicussed metformin including benefits and risks. She was informed that eating too many simple carbohydrates or too many calories at one sitting increases the likelihood of GI side effects. Stacey Huerta will continue metformin, continue weight loss, and follow-up with [place] as directed to monitor her progress.  Obesity Stacey Huerta is currently in the action stage of change. As such, her goal is to continue with weight loss efforts. She has agreed to keep a food journal with 1500 calories + 200 protein calories and 100 grams of protein daily. Stacey Huerta has been instructed to work up to a goal of 150 minutes of combined cardio and strengthening exercise per week for weight loss and overall health benefits. We discussed the following Behavioral Modification Strategies today: increasing lean protein intake, work on meal planning and easy cooking plans.  Stacey Huerta has agreed to follow-up with our clinic in 2 weeks. She was informed of the importance of frequent  follow-up visits to maximize her success with intensive lifestyle modifications for her multiple health conditions.  ALLERGIES: Allergies  Allergen Reactions  . Eggs Or Egg-Derived Products   . Metformin And Related     Possible lactic acidosis  . Sulfonamide Derivatives Hives    Also  high fever  . Ganoderma Lucidum   . [place]   . Other     Mushrooms and [place] Nuts  . Penicillins     Childhood reaction  . Shellfish Allergy Nausea And Vomiting  . Fish-Derived Products Rash    MEDICATIONS: Current Outpatient Medications on File Prior to Visit  Medication Sig Dispense Refill  . aspirin EC 81 MG tablet Take 1 tablet (81 mg total) by mouth daily.    . B Complex-C-Folic Acid (B-COMPLEX BALANCED PO) Take by mouth.    . calcium carbonate 200 MG capsule Take 250 mg by mouth 2 (two) times daily with a meal.    . Cholecalciferol (VITAMIN D) 125 MCG (5000 UT) CAPS Take 1 capsule by mouth daily.    Marland Kitchen doxycycline (VIBRA-TABS) 100 MG tablet Take 1 tablet (100 mg total) by mouth 2 (two) times daily. 20 tablet 0  . estradiol (ESTRACE) 2 MG tablet Take 2 mg by mouth daily.    Marland Kitchen HYDROcodone-homatropine (HYCODAN) 5-1.5 MG/5ML syrup 1 tsp po qhs prn 120 mL 0  . letrozole (FEMARA) 2.5 MG tablet Take 5 mg by mouth daily.    Marland Kitchen MAGNESIUM PO Take 1 tablet by mouth daily.    . metFORMIN (GLUCOPHAGE-XR) 500 MG 24 hr tablet Take 1 tablet (500 mg total) by mouth daily with breakfast. 30 tablet 1  . Nutritional Supplements (DHEA PO) Take 1 tablet by mouth daily.    . Omega-3 Fatty Acids (FISH OIL) 1000 MG CAPS Take by mouth.    . oseltamivir (TAMIFLU) 75 MG capsule Take 1 capsule (75 mg total) by mouth 2 (two) times daily. 10 capsule 0  . Prenatal Vit-Fe Fumarate-FA (PRENATAL VITAMIN PO) Take 1 tablet by mouth daily.    . Probiotic Product (PROBIOTIC-10 PO) Take by mouth.    . progesterone (PROMETRIUM) 200 MG capsule Take 200 mg by mouth 2 (two) times daily.    . promethazine-dextromethorphan (PROMETHAZINE-DM) 6.25-15 MG/5ML syrup Take 5 mLs by mouth 4 (four) times daily as needed. 118 mL 0  . pyridOXINE (VITAMIN B-6) 50 MG tablet Take 50 mg by mouth daily.    . SELENIUM PO Take 1 tablet by mouth daily.    Marland Kitchen VITAMIN E PO Take 1 tablet by mouth daily.    Marland Kitchen zinc gluconate 50 MG tablet Take 50  mg by mouth daily.     No current facility-administered medications on file prior to visit.     PAST MEDICAL HISTORY: Past Medical History:  Diagnosis Date  . Allergy   . Back pain   . Dyspnea   . Fibroid   . Headache(784.0)   . Hypertension during pregnancy   . Infertility, female   . Leg edema   . Osteoarthritis   . Postpartum depression   . Sjogren's syndrome ([place]) 10/28/2015  . Vitamin B 12 deficiency   . Vitamin D deficiency     PAST SURGICAL HISTORY: Past Surgical History:  Procedure Laterality Date  . ABDOMINAL ADHESION SURGERY    . CESAREAN SECTION N/A 03/18/2014   Procedure: CESAREAN SECTION;  Surgeon: Delice Lesch, MD;  Location: [facility];  Service: Obstetrics;  Laterality: N/A;  spinal/epidural  . CHOLECYSTECTOMY    .  ENDOMETRIAL ABLATION    . LYMPHADENECTOMY     Removed from the neck at age 49  . MYOMECTOMY    . MYOMECTOMY  05/23/2012   Procedure: MYOMECTOMY;  Surgeon: Governor Specking, MD;  Location: [facility];  Service: Gynecology;  Laterality: N/A;    SOCIAL HISTORY: Social History   Tobacco Use  . Smoking status: Never Smoker  . Smokeless tobacco: Never Used  Substance Use Topics  . Alcohol use: Yes    Alcohol/week: 0.0 standard drinks    Comment: socially  . Drug use: No    FAMILY HISTORY: Family History  Problem Relation Age of Onset  . Hypertension Paternal Grandfather   . Diabetes Paternal Grandfather   . Diabetes Father   . Hypotension Father   . Colon cancer Paternal Grandmother   . Diabetes Paternal Grandmother   . Prostate cancer Maternal Grandfather   . Hyperlipidemia Mother   . Obesity Mother   . Breast cancer Maternal Aunt   . Arthritis Paternal Aunt   . Hyperlipidemia Maternal Aunt        x's 2   ROS: Review of Systems  Gastrointestinal: Negative for diarrhea, nausea and vomiting.  Musculoskeletal:       Negative for muscle weakness.  Endo/Heme/Allergies:       Negative for polyphagia.   PHYSICAL EXAM: Pt in no acute  distress  RECENT LABS AND TESTS: BMET    Component Value Date/Time   NA 139 03/05/2019 1003   K 4.5 03/05/2019 1003   CL 104 03/05/2019 1003   CO2 22 03/05/2019 1003   GLUCOSE 82 03/05/2019 1003   GLUCOSE 104 (H) 05/02/2018 1216   BUN 12 03/05/2019 1003   CREATININE 0.87 03/05/2019 1003   CREATININE 0.98 08/19/2014 1223   CALCIUM 9.1 03/05/2019 1003   GFRNONAA 81 03/05/2019 1003   GFRAA 94 03/05/2019 1003   Lab Results  Component Value Date   HGBA1C 5.0 03/05/2019   HGBA1C 4.9 10/07/2018   HGBA1C 5.0 06/26/2018   HGBA1C 5.2 12/19/2015   HGBA1C 5.2 05/31/2008   Lab Results  Component Value Date   INSULIN 21.8 03/05/2019   INSULIN 10.5 10/07/2018   INSULIN 15.4 06/26/2018   CBC    Component Value Date/Time   WBC 4.4 06/26/2018 1130   WBC 4.1 05/02/2018 1216   RBC 4.38 06/26/2018 1130   RBC 4.23 05/02/2018 1216   HGB 13.6 06/26/2018 1130   HCT 41.1 06/26/2018 1130   PLT 182.0 05/02/2018 1216   MCV 94 06/26/2018 1130   MCH 31.1 06/26/2018 1130   MCH 30.0 08/19/2014 1226   MCH 32.4 03/20/2014 0812   MCHC 33.1 06/26/2018 1130   MCHC 33.6 05/02/2018 1216   RDW 12.0 (L) 06/26/2018 1130   LYMPHSABS 1.3 06/26/2018 1130   MONOABS 0.3 05/02/2018 1216   EOSABS 0.0 06/26/2018 1130   BASOSABS 0.0 06/26/2018 1130   Iron/TIBC/Ferritin/ %Sat No results found for: IRON, TIBC, FERRITIN, IRONPCTSAT Lipid Panel     Component Value Date/Time   CHOL 168 10/07/2018 1149   TRIG 106 10/07/2018 1149   HDL 50 10/07/2018 1149   CHOLHDL 4 12/19/2015 0852   VLDL 20.2 12/19/2015 0852   LDLCALC 97 10/07/2018 1149   Hepatic Function Panel     Component Value Date/Time   PROT 7.1 03/05/2019 1003   ALBUMIN 3.7 (L) 03/05/2019 1003   AST 13 03/05/2019 1003   ALT 11 03/05/2019 1003   ALKPHOS 105 03/05/2019 1003   BILITOT 0.6 03/05/2019 1003  BILIDIR 0.1 12/14/2014 1402   IBILI 0.9 04/30/2008 2117      Component Value Date/Time   TSH 2.420 06/26/2018 1130   TSH 1.59  05/02/2018 1216   TSH 1.34 12/14/2014 1402   Results for ARAEYA, LAMB (MRN [id number]) as of 03/25/2019 15:31  Ref. Range 03/05/2019 10:03  Vitamin D, 25-Hydroxy Latest Ref Range: 30.0 - 100.0 ng/mL 53.5    I, Michaelene Song, am acting as Location manager for [employer], PA-C I, Abby Potash, PA-C have reviewed above note and agree with its content

## 2019-04-04 ENCOUNTER — Encounter (INDEPENDENT_AMBULATORY_CARE_PROVIDER_SITE_OTHER): Payer: Self-pay | Admitting: Physician Assistant

## 2019-04-05 NOTE — Telephone Encounter (Signed)
Please review

## 2019-04-06 NOTE — Telephone Encounter (Signed)
Can we place referral at appt on 6/16, so it will be associated with correct diagnosis?

## 2019-04-07 ENCOUNTER — Telehealth (INDEPENDENT_AMBULATORY_CARE_PROVIDER_SITE_OTHER): Payer: BLUE CROSS/BLUE SHIELD | Admitting: Physician Assistant

## 2019-04-07 ENCOUNTER — Other Ambulatory Visit: Payer: Self-pay

## 2019-04-07 ENCOUNTER — Encounter (INDEPENDENT_AMBULATORY_CARE_PROVIDER_SITE_OTHER): Payer: Self-pay | Admitting: Physician Assistant

## 2019-04-07 DIAGNOSIS — K5909 Other constipation: Secondary | ICD-10-CM

## 2019-04-07 DIAGNOSIS — Z6841 Body Mass Index (BMI) 40.0 and over, adult: Secondary | ICD-10-CM

## 2019-04-07 DIAGNOSIS — E8881 Metabolic syndrome: Secondary | ICD-10-CM | POA: Diagnosis not present

## 2019-04-07 DIAGNOSIS — E559 Vitamin D deficiency, unspecified: Secondary | ICD-10-CM | POA: Diagnosis not present

## 2019-04-08 NOTE — Progress Notes (Signed)
Office: [phone]  /  Fax: [phone] TeleHealth Visit:  Maahi Lannan has verbally consented to this TeleHealth visit today. The patient is located in her car, the provider is located at the [facility] office. The participants in this visit include the listed provider and patient. The visit was conducted today via Webex.  HPI:   Chief Complaint: OBESITY Charnise is here to discuss her progress with her obesity treatment plan. She is keeping a food journal with 1500 calories and 100 grams of protein and is following her eating plan approximately 100% of the time. She states she is doing HIIT workouts 60-90 minutes 3 times per week. Clarene reports being frustrated with the plan and her lack of weight loss. She is following it very close and exercising regularly.  We were unable to weigh the patient today for this TeleHealth visit. She feels as if she has gained 3 lbs since her last visit. She has lost 12 lbs since starting treatment with [place].  Vitamin D deficiency Hula has a diagnosis of Vitamin D deficiency. She is currently taking prescription Vit D and denies nausea, vomiting or muscle weakness.  Insulin Resistance Ahonesty has a diagnosis of insulin resistance based on her elevated fasting insulin level >5. Although [name]'s blood glucose readings are still under good control, insulin resistance puts her at greater risk of metabolic syndrome and diabetes. She is taking metformin currently and continues to work on diet and exercise to decrease risk of diabetes. No nausea, vomiting, or diarrhea. No polyphagia.  Constipation Seanne is on probiotics and fiber supplements. She states she is eating extra fiber daily.  ASSESSMENT AND PLAN:  No diagnosis found.  PLAN:  Vitamin D Deficiency Kolbi was informed that low Vitamin D levels contributes to fatigue and are associated with obesity, breast, and colon cancer. She agrees to continue to take prescription  Vit D @ 50,000 IU every week #4 with 0 refills and will follow-up for routine testing of Vitamin D, at least 2-3 times per year. She was informed of the risk of over-replacement of Vitamin D and agrees to not increase her dose unless she discusses this with [place] first. Nataliee agrees to follow-up with our clinic in 2 weeks.  Insulin Resistance Corda will continue to work on weight loss, exercise, and decreasing simple carbohydrates in her diet to help decrease the risk of diabetes. We dicussed metformin including benefits and risks. She was informed that eating too many simple carbohydrates or too many calories at one sitting increases the likelihood of GI side effects. Christabell will continue metformin and will continue weight loss. She will follow-up with [place] as directed to monitor her progress.  Constipation Jhoselyn will be referred to Gastroenterology for evaluation of her constipation.  Obesity Livian is currently in the action stage of change. As such, her goal is to continue with weight loss efforts. She has agreed to keep a food journal with 1500 calories and 100 grams of protein. Louna has been instructed to work up to a goal of 150 minutes of combined cardio and strengthening exercise per week for weight loss and overall health benefits. We discussed the following Behavioral Modification Strategies today: work on meal planning, easy cooking plans, and keeping healthy foods in the home.  Eletha has agreed to follow-up with our clinic in 2 weeks. She was informed of the importance of frequent follow-up visits to maximize her success with intensive lifestyle modifications for her multiple health conditions.  ALLERGIES: Allergies  Allergen Reactions   Eggs Or Egg-Derived Products    Metformin And Related     Possible lactic acidosis   Sulfonamide Derivatives Hives    Also high fever   Ganoderma Lucidum    Maitake    Other     Mushrooms and [place] Nuts   Penicillins      Childhood reaction   Shellfish Allergy Nausea And Vomiting   Fish-Derived Products Rash    MEDICATIONS: Current Outpatient Medications on File Prior to Visit  Medication Sig Dispense Refill   aspirin EC 81 MG tablet Take 1 tablet (81 mg total) by mouth daily.     B Complex-C-Folic Acid (B-COMPLEX BALANCED PO) Take by mouth.     calcium carbonate 200 MG capsule Take 250 mg by mouth 2 (two) times daily with a meal.     Cholecalciferol (VITAMIN D) 125 MCG (5000 UT) CAPS Take 1 capsule by mouth daily.     doxycycline (VIBRA-TABS) 100 MG tablet Take 1 tablet (100 mg total) by mouth 2 (two) times daily. 20 tablet 0   estradiol (ESTRACE) 2 MG tablet Take 2 mg by mouth daily.     HYDROcodone-homatropine (HYCODAN) 5-1.5 MG/5ML syrup 1 tsp po qhs prn 120 mL 0   letrozole (FEMARA) 2.5 MG tablet Take 5 mg by mouth daily.     MAGNESIUM PO Take 1 tablet by mouth daily.     metFORMIN (GLUCOPHAGE-XR) 500 MG 24 hr tablet Take 1 tablet (500 mg total) by mouth daily with breakfast. 30 tablet 1   Nutritional Supplements (DHEA PO) Take 1 tablet by mouth daily.     Omega-3 Fatty Acids (FISH OIL) 1000 MG CAPS Take by mouth.     oseltamivir (TAMIFLU) 75 MG capsule Take 1 capsule (75 mg total) by mouth 2 (two) times daily. 10 capsule 0   Prenatal Vit-Fe Fumarate-FA (PRENATAL VITAMIN PO) Take 1 tablet by mouth daily.     Probiotic Product (PROBIOTIC-10 PO) Take by mouth.     progesterone (PROMETRIUM) 200 MG capsule Take 200 mg by mouth 2 (two) times daily.     promethazine-dextromethorphan (PROMETHAZINE-DM) 6.25-15 MG/5ML syrup Take 5 mLs by mouth 4 (four) times daily as needed. 118 mL 0   pyridOXINE (VITAMIN B-6) 50 MG tablet Take 50 mg by mouth daily.     SELENIUM PO Take 1 tablet by mouth daily.     Vitamin D, Ergocalciferol, (DRISDOL) 1.25 MG (50000 UT) CAPS capsule Take 1 capsule (50,000 Units total) by mouth every 7 (seven) days. 4 capsule 0   VITAMIN E PO Take 1 tablet by mouth daily.      zinc gluconate 50 MG tablet Take 50 mg by mouth daily.     No current facility-administered medications on file prior to visit.     PAST MEDICAL HISTORY: Past Medical History:  Diagnosis Date   Allergy    Back pain    Dyspnea    Fibroid    Headache(784.0)    Hypertension during pregnancy    Infertility, female    Leg edema    Osteoarthritis    Postpartum depression    Sjogren's syndrome ([place]) 10/28/2015   Vitamin B 12 deficiency    Vitamin D deficiency     PAST SURGICAL HISTORY: Past Surgical History:  Procedure Laterality Date   ABDOMINAL ADHESION SURGERY     CESAREAN SECTION N/A 03/18/2014   Procedure: CESAREAN SECTION;  Surgeon: Delice Lesch, MD;  Location: [facility];  Service: Obstetrics;  Laterality: N/A;  spinal/epidural   CHOLECYSTECTOMY     ENDOMETRIAL ABLATION     LYMPHADENECTOMY     Removed from the neck at age 78   MYOMECTOMY     MYOMECTOMY  05/23/2012   Procedure: MYOMECTOMY;  Surgeon: Governor Specking, MD;  Location: [facility];  Service: Gynecology;  Laterality: N/A;    SOCIAL HISTORY: Social History   Tobacco Use   Smoking status: Never Smoker   Smokeless tobacco: Never Used  Substance Use Topics   Alcohol use: Yes    Alcohol/week: 0.0 standard drinks    Comment: socially   Drug use: No    FAMILY HISTORY: Family History  Problem Relation Age of Onset   Hypertension Paternal Grandfather    Diabetes Paternal Grandfather    Diabetes Father    Hypotension Father    Colon cancer Paternal Grandmother    Diabetes Paternal Grandmother    Prostate cancer Maternal Grandfather    Hyperlipidemia Mother    Obesity Mother    Breast cancer Maternal Aunt    Arthritis Paternal Aunt    Hyperlipidemia Maternal Aunt        x's 2   ROS: Review of Systems  Gastrointestinal: Positive for constipation. Negative for diarrhea, nausea and vomiting.  Musculoskeletal:       Negative for muscle weakness.  Endo/Heme/Allergies:        Negative for polyphagia.   PHYSICAL EXAM: Pt in no acute distress  RECENT LABS AND TESTS: BMET    Component Value Date/Time   NA 139 03/05/2019 1003   K 4.5 03/05/2019 1003   CL 104 03/05/2019 1003   CO2 22 03/05/2019 1003   GLUCOSE 82 03/05/2019 1003   GLUCOSE 104 (H) 05/02/2018 1216   BUN 12 03/05/2019 1003   CREATININE 0.87 03/05/2019 1003   CREATININE 0.98 08/19/2014 1223   CALCIUM 9.1 03/05/2019 1003   GFRNONAA 81 03/05/2019 1003   GFRAA 94 03/05/2019 1003   Lab Results  Component Value Date   HGBA1C 5.0 03/05/2019   HGBA1C 4.9 10/07/2018   HGBA1C 5.0 06/26/2018   HGBA1C 5.2 12/19/2015   HGBA1C 5.2 05/31/2008   Lab Results  Component Value Date   INSULIN 21.8 03/05/2019   INSULIN 10.5 10/07/2018   INSULIN 15.4 06/26/2018   CBC    Component Value Date/Time   WBC 4.4 06/26/2018 1130   WBC 4.1 05/02/2018 1216   RBC 4.38 06/26/2018 1130   RBC 4.23 05/02/2018 1216   HGB 13.6 06/26/2018 1130   HCT 41.1 06/26/2018 1130   PLT 182.0 05/02/2018 1216   MCV 94 06/26/2018 1130   MCH 31.1 06/26/2018 1130   MCH 30.0 08/19/2014 1226   MCH 32.4 03/20/2014 0812   MCHC 33.1 06/26/2018 1130   MCHC 33.6 05/02/2018 1216   RDW 12.0 (L) 06/26/2018 1130   LYMPHSABS 1.3 06/26/2018 1130   MONOABS 0.3 05/02/2018 1216   EOSABS 0.0 06/26/2018 1130   BASOSABS 0.0 06/26/2018 1130   Iron/TIBC/Ferritin/ %Sat No results found for: IRON, TIBC, FERRITIN, IRONPCTSAT Lipid Panel     Component Value Date/Time   CHOL 168 10/07/2018 1149   TRIG 106 10/07/2018 1149   HDL 50 10/07/2018 1149   CHOLHDL 4 12/19/2015 0852   VLDL 20.2 12/19/2015 0852   LDLCALC 97 10/07/2018 1149   Hepatic Function Panel     Component Value Date/Time   PROT 7.1 03/05/2019 1003   ALBUMIN 3.7 (L) 03/05/2019 1003   AST 13 03/05/2019 1003   ALT 11 03/05/2019 1003  ALKPHOS 105 03/05/2019 1003   BILITOT 0.6 03/05/2019 1003   BILIDIR 0.1 12/14/2014 1402   IBILI 0.9 04/30/2008 2117        Component Value Date/Time   TSH 2.420 06/26/2018 1130   TSH 1.59 05/02/2018 1216   TSH 1.34 12/14/2014 1402   Results for LADAWN, BOULLION (MRN [id number]) as of 04/08/2019 16:18  Ref. Range 03/05/2019 10:03  Vitamin D, 25-Hydroxy Latest Ref Range: 30.0 - 100.0 ng/mL 53.5    I, Michaelene Song, am acting as Location manager for [employer], PA-C

## 2019-04-09 ENCOUNTER — Telehealth: Payer: Self-pay | Admitting: Gastroenterology

## 2019-04-09 MED ORDER — VITAMIN D (ERGOCALCIFEROL) 1.25 MG (50000 UNIT) PO CAPS: 50000.0000 [IU] | ORAL_CAPSULE | ORAL | 0 refills | Status: DC

## 2019-04-09 NOTE — Telephone Encounter (Signed)
DOD 04-09-19 AM Dr. Tarri Glenn  Patient is being referred over to our office for constipation. Records well be sent for review by DOD. Dr. Tarri Glenn please advise on scheduling.

## 2019-04-10 NOTE — Telephone Encounter (Signed)
Dr. Tarri Glenn accept to see the patient at her next available opening.

## 2019-04-16 ENCOUNTER — Other Ambulatory Visit (INDEPENDENT_AMBULATORY_CARE_PROVIDER_SITE_OTHER): Payer: Self-pay | Admitting: Physician Assistant

## 2019-04-16 DIAGNOSIS — E8881 Metabolic syndrome: Secondary | ICD-10-CM

## 2019-04-21 ENCOUNTER — Telehealth (INDEPENDENT_AMBULATORY_CARE_PROVIDER_SITE_OTHER): Payer: BC Managed Care – PPO | Admitting: Physician Assistant

## 2019-04-21 ENCOUNTER — Encounter (INDEPENDENT_AMBULATORY_CARE_PROVIDER_SITE_OTHER): Payer: Self-pay | Admitting: Physician Assistant

## 2019-04-21 ENCOUNTER — Other Ambulatory Visit: Payer: Self-pay

## 2019-04-21 DIAGNOSIS — Z6841 Body Mass Index (BMI) 40.0 and over, adult: Secondary | ICD-10-CM

## 2019-04-21 DIAGNOSIS — E559 Vitamin D deficiency, unspecified: Secondary | ICD-10-CM

## 2019-04-21 DIAGNOSIS — E8881 Metabolic syndrome: Secondary | ICD-10-CM

## 2019-04-21 MED ORDER — VITAMIN D (ERGOCALCIFEROL) 1.25 MG (50000 UNIT) PO CAPS: 50000.0000 [IU] | ORAL_CAPSULE | ORAL | 0 refills | Status: DC

## 2019-04-22 NOTE — Progress Notes (Signed)
Office: [phone]  /  Fax: [phone] TeleHealth Visit:  Stacey Huerta has verbally consented to this TeleHealth visit today. The patient is located in the car, the provider is located at the [facility] office. The participants in this visit include the listed provider, patient, and "Stacey Huerta". The visit was conducted today via Webex.  HPI:   Chief Complaint: OBESITY Stacey Huerta is here to discuss her progress with her obesity treatment plan. She is keeping a food journal with 1500 calories and 100 grams of protein and is following her eating plan approximately 95% of the time. She states she is doing cardio/strengthening 60 minutes 4 days a week. Stacey Huerta reports that she has been following the plan closely, working out regularly, and is frustrated with the lack of weight loss. She just had a food sensitivity test done and is seeing GI in 3 weeks. We were unable to weigh the patient today for this TeleHealth visit. She feels as if she has maintained her weight since her last visit. She has lost 12 lbs since starting treatment with [place].  Vitamin D deficiency Stacey Huerta has a diagnosis of Vitamin D deficiency. She is currently taking weekly prescription Vit D and denies nausea, vomiting or muscle weakness.  Insulin Resistance Stacey Huerta has a diagnosis of insulin resistance based on her elevated fasting insulin level >5. Although Stacey Huerta's blood glucose readings are still under good control, insulin resistance puts her at greater risk of metabolic syndrome and diabetes. She is taking metformin currently and continues to work on diet and exercise to decrease risk of diabetes. No nausea, vomiting, diarrhea, or polyphagia.  ASSESSMENT AND PLAN:  Vitamin D deficiency - Plan: Vitamin D, Ergocalciferol, (DRISDOL) 1.25 MG (50000 UT) CAPS capsule  Insulin resistance  Class 3 severe obesity with serious comorbidity and body mass index (BMI) of 40.0 to 44.9 in adult, unspecified obesity  type ([place])  PLAN:  Vitamin D Deficiency Stacey Huerta was informed that low Vitamin D levels contributes to fatigue and are associated with obesity, breast, and colon cancer. She agrees to continue to take prescription Vit D @ 50,000 IU every week #4 with 0 refills and will follow-up for routine testing of Vitamin D, at least 2-3 times per year. She was informed of the risk of over-replacement of Vitamin D and agrees to not increase her dose unless she discusses this with [place] first. Stacey Huerta agrees to follow-up with our clinic in 2 weeks.  Insulin Resistance Stacey Huerta will continue to work on weight loss, exercise, and decreasing simple carbohydrates in her diet to help decrease the risk of diabetes. We dicussed metformin including benefits and risks. She was informed that eating too many simple carbohydrates or too many calories at one sitting increases the likelihood of GI side effects. Lis will continue metformin, continue weight loss, and follow-up with [place] as directed to monitor her progress.  Obesity Stacey Huerta is currently in the action stage of change. As such, her goal is to continue with weight loss efforts. She has agreed to keep a food journal with 1500 calories and 100 grams of protein daily. Stacey Huerta has been instructed to work up to a goal of 150 minutes of combined cardio and strengthening exercise per week for weight loss and overall health benefits. We discussed the following Behavioral Modification Strategies today: increasing lean protein intake and work on meal planning and easy cooking plans  Stacey Huerta has agreed to follow-up with our clinic in 2 weeks. She was informed of the importance of frequent  follow-up visits to maximize her success with intensive lifestyle modifications for her multiple health conditions.  ALLERGIES: Allergies  Allergen Reactions   Eggs Or Egg-Derived Products    Metformin And Related     Possible lactic acidosis   Sulfonamide Derivatives Hives    Also  high fever   Ganoderma Lucidum    Maitake    Other     Mushrooms and [place] Nuts   Penicillins     Childhood reaction   Shellfish Allergy Nausea And Vomiting   Fish-Derived Products Rash    MEDICATIONS: Current Outpatient Medications on File Prior to Visit  Medication Sig Dispense Refill   aspirin EC 81 MG tablet Take 1 tablet (81 mg total) by mouth daily.     B Complex-C-Folic Acid (B-COMPLEX BALANCED PO) Take by mouth.     calcium carbonate 200 MG capsule Take 250 mg by mouth 2 (two) times daily with a meal.     Cholecalciferol (VITAMIN D) 125 MCG (5000 UT) CAPS Take 1 capsule by mouth daily.     doxycycline (VIBRA-TABS) 100 MG tablet Take 1 tablet (100 mg total) by mouth 2 (two) times daily. 20 tablet 0   estradiol (ESTRACE) 2 MG tablet Take 2 mg by mouth daily.     HYDROcodone-homatropine (HYCODAN) 5-1.5 MG/5ML syrup 1 tsp po qhs prn 120 mL 0   letrozole (FEMARA) 2.5 MG tablet Take 5 mg by mouth daily.     MAGNESIUM PO Take 1 tablet by mouth daily.     metFORMIN (GLUCOPHAGE-XR) 500 MG 24 hr tablet Take 1 tablet (500 mg total) by mouth daily with breakfast. 30 tablet 1   Nutritional Supplements (DHEA PO) Take 1 tablet by mouth daily.     Omega-3 Fatty Acids (FISH OIL) 1000 MG CAPS Take by mouth.     oseltamivir (TAMIFLU) 75 MG capsule Take 1 capsule (75 mg total) by mouth 2 (two) times daily. 10 capsule 0   Prenatal Vit-Fe Fumarate-FA (PRENATAL VITAMIN PO) Take 1 tablet by mouth daily.     Probiotic Product (PROBIOTIC-10 PO) Take by mouth.     progesterone (PROMETRIUM) 200 MG capsule Take 200 mg by mouth 2 (two) times daily.     promethazine-dextromethorphan (PROMETHAZINE-DM) 6.25-15 MG/5ML syrup Take 5 mLs by mouth 4 (four) times daily as needed. 118 mL 0   pyridOXINE (VITAMIN B-6) 50 MG tablet Take 50 mg by mouth daily.     SELENIUM PO Take 1 tablet by mouth daily.     VITAMIN E PO Take 1 tablet by mouth daily.     zinc gluconate 50 MG tablet Take 50  mg by mouth daily.     No current facility-administered medications on file prior to visit.     PAST MEDICAL HISTORY: Past Medical History:  Diagnosis Date   Allergy    Back pain    Dyspnea    Fibroid    Headache(784.0)    Hypertension during pregnancy    Infertility, female    Leg edema    Osteoarthritis    Postpartum depression    Sjogren's syndrome ([place]) 10/28/2015   Vitamin B 12 deficiency    Vitamin D deficiency     PAST SURGICAL HISTORY: Past Surgical History:  Procedure Laterality Date   ABDOMINAL ADHESION SURGERY     CESAREAN SECTION N/A 03/18/2014   Procedure: CESAREAN SECTION;  Surgeon: Delice Lesch, MD;  Location: [facility];  Service: Obstetrics;  Laterality: N/A;  spinal/epidural   CHOLECYSTECTOMY  ENDOMETRIAL ABLATION     LYMPHADENECTOMY     Removed from the neck at age 53   MYOMECTOMY     MYOMECTOMY  05/23/2012   Procedure: MYOMECTOMY;  Surgeon: Governor Specking, MD;  Location: [facility];  Service: Gynecology;  Laterality: N/A;    SOCIAL HISTORY: Social History   Tobacco Use   Smoking status: Never Smoker   Smokeless tobacco: Never Used  Substance Use Topics   Alcohol use: Yes    Alcohol/week: 0.0 standard drinks    Comment: socially   Drug use: No    FAMILY HISTORY: Family History  Problem Relation Age of Onset   Hypertension Paternal Grandfather    Diabetes Paternal Grandfather    Diabetes Father    Hypotension Father    Colon cancer Paternal Grandmother    Diabetes Paternal Grandmother    Prostate cancer Maternal Grandfather    Hyperlipidemia Mother    Obesity Mother    Breast cancer Maternal Aunt    Arthritis Paternal Aunt    Hyperlipidemia Maternal Aunt        x's 2   ROS: Review of Systems  Gastrointestinal: Negative for diarrhea, nausea and vomiting.  Musculoskeletal:       Negative for muscle weakness.  Endo/Heme/Allergies:       Negative for polyphagia.   PHYSICAL EXAM: Pt in no acute  distress  RECENT LABS AND TESTS: BMET    Component Value Date/Time   NA 139 03/05/2019 1003   K 4.5 03/05/2019 1003   CL 104 03/05/2019 1003   CO2 22 03/05/2019 1003   GLUCOSE 82 03/05/2019 1003   GLUCOSE 104 (H) 05/02/2018 1216   BUN 12 03/05/2019 1003   CREATININE 0.87 03/05/2019 1003   CREATININE 0.98 08/19/2014 1223   CALCIUM 9.1 03/05/2019 1003   GFRNONAA 81 03/05/2019 1003   GFRAA 94 03/05/2019 1003   Lab Results  Component Value Date   HGBA1C 5.0 03/05/2019   HGBA1C 4.9 10/07/2018   HGBA1C 5.0 06/26/2018   HGBA1C 5.2 12/19/2015   HGBA1C 5.2 05/31/2008   Lab Results  Component Value Date   INSULIN 21.8 03/05/2019   INSULIN 10.5 10/07/2018   INSULIN 15.4 06/26/2018   CBC    Component Value Date/Time   WBC 4.4 06/26/2018 1130   WBC 4.1 05/02/2018 1216   RBC 4.38 06/26/2018 1130   RBC 4.23 05/02/2018 1216   HGB 13.6 06/26/2018 1130   HCT 41.1 06/26/2018 1130   PLT 182.0 05/02/2018 1216   MCV 94 06/26/2018 1130   MCH 31.1 06/26/2018 1130   MCH 30.0 08/19/2014 1226   MCH 32.4 03/20/2014 0812   MCHC 33.1 06/26/2018 1130   MCHC 33.6 05/02/2018 1216   RDW 12.0 (L) 06/26/2018 1130   LYMPHSABS 1.3 06/26/2018 1130   MONOABS 0.3 05/02/2018 1216   EOSABS 0.0 06/26/2018 1130   BASOSABS 0.0 06/26/2018 1130   Iron/TIBC/Ferritin/ %Sat No results found for: IRON, TIBC, FERRITIN, IRONPCTSAT Lipid Panel     Component Value Date/Time   CHOL 168 10/07/2018 1149   TRIG 106 10/07/2018 1149   HDL 50 10/07/2018 1149   CHOLHDL 4 12/19/2015 0852   VLDL 20.2 12/19/2015 0852   LDLCALC 97 10/07/2018 1149   Hepatic Function Panel     Component Value Date/Time   PROT 7.1 03/05/2019 1003   ALBUMIN 3.7 (L) 03/05/2019 1003   AST 13 03/05/2019 1003   ALT 11 03/05/2019 1003   ALKPHOS 105 03/05/2019 1003   BILITOT 0.6 03/05/2019 1003  BILIDIR 0.1 12/14/2014 1402   IBILI 0.9 04/30/2008 2117      Component Value Date/Time   TSH 2.420 06/26/2018 1130   TSH 1.59  05/02/2018 1216   TSH 1.34 12/14/2014 1402   Results for SINIA, ANTOSH (MRN [id number]) as of 04/22/2019 13:36  Ref. Range 03/05/2019 10:03  Vitamin D, 25-Hydroxy Latest Ref Range: 30.0 - 100.0 ng/mL 53.5   I, Michaelene Song, am acting as Location manager for [employer], PA-C I, Abby Potash, PA-C have reviewed above note and agree with its content

## 2019-04-23 ENCOUNTER — Other Ambulatory Visit (INDEPENDENT_AMBULATORY_CARE_PROVIDER_SITE_OTHER): Payer: Self-pay | Admitting: Physician Assistant

## 2019-04-23 DIAGNOSIS — E8881 Metabolic syndrome: Secondary | ICD-10-CM

## 2019-05-03 NOTE — Progress Notes (Signed)
TELEHEALTH VISIT  Referring Provider: Ann Held, * Primary Care Physician:  Carollee Herter, Alferd Apa, DO   Tele-visit due to COVID-19 pandemic Patient requested visit virtually, consented to the virtual encounter via video enabled telemedicine application (Zoom) Contact made at: 14:30 05/04/19 Patient verified by name and date of birth Location of patient: Home Location provider: [facility] Names of persons participating: Me, patient, Tinnie Gens CMA Time spent on telehealth visit: 36 minutes I discussed the limitations of evaluation and management by telemedicine. The patient expressed understanding and agreed to proceed.  Reason for Consultation:  Constipation   IMPRESSION:  Abdominal pain with bloating Constipation Cholecystectomy 2008   PLAN: EGD Lactulose and methane breath test  Continue your daily probiotic  Thank you for your patience with me and our technology today! Please stay home, safe, and healthy. I look forward to meeting you in person in the future.   Please see the "Patient Instructions" section for addition details about the plan.  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 The history is obtained through the patient, review of her electronic health record, and review of her prior records from her last gastroenterologist.  She had a cholecystectomy 2008 for chronic cholecystitis and cholelithiasis 07/14/2008, myomectomy for fibroids, ex lap for adhesions, endometrial ablation.   Longstanding history of food allergies. Testing in 2013.   Dull, intermittent, non-radiating lower abdominal pain developed after she lost a pregnancy after 3 years of trying fertility treatment. She feels like it may be endometriosis because of the fluctuation that occurs during menses. Also worsened by movement. Occasionally intensifies if she bends over. No change with eating. Occasionally improved with  defecation.   Constant bloating without distension. Not worsened by eating. Feels like food does not digest properly. Frequent eructation and flatus.  Longstanding history of constipation. Pain is not related to defecation. No blood or mucous in the stools.   Seen by Dr. Nicoletta Dress in 2016 and 2019 for RLQ pain and constipation. A high quality colonoscopy 06/15/15 revealed no source. Small bowel follow through was negative in 2016. CT abd/pelvis with contrast should no source of pain but identified incidental lung nodules.  She was instructed to use gummy candy fiber to treat constipation. TSH and calcium had been obtained through PCP.  Follow-up chest CT showed no active pulmonary disease, stable right lung nodules  and axillary LAD unchanged from 2009.  She is hoping to have another child. Going through fertility treatment. She has lost 60 pounds in an effort to improver her fertility. She notes that her weight fluctuates severely. She feels like she should have lost more weight after 2 years of serious effort.   She had food sensitivity testing through [facility] including garlic, ginger.  She has developed acne.   Increased fiber intake with fiber gummies and dietary fiber. Reduced sugars and carbs. Reduced dairy. Taking [place] probiotics twice daily x 18 months.  No improvement with a candida cleanse. She has tried multiple detoxes without a change.   She feels like something is going on that has not yet been identified.   Paternal grandmother with colon cancer. Father with multiple food sensitivities. No other known family history of colon cancer or polyps. No family history of uterine/endometrial cancer, pancreatic cancer or gastric/stomach cancer.  Past Medical History:  Diagnosis Date   Allergy    Back pain    Dyspnea    Fibroid  Headache(784.0)    Hypertension during pregnancy    Infertility, female    Leg edema    Osteoarthritis    Postpartum depression    Sjogren's  syndrome ([place]) 10/28/2015   Vitamin B 12 deficiency    Vitamin D deficiency     Past Surgical History:  Procedure Laterality Date   ABDOMINAL ADHESION SURGERY     CESAREAN SECTION N/A 03/18/2014   Procedure: CESAREAN SECTION;  Surgeon: Delice Lesch, MD;  Location: [facility];  Service: Obstetrics;  Laterality: N/A;  spinal/epidural   CHOLECYSTECTOMY     ENDOMETRIAL ABLATION     LYMPHADENECTOMY     Removed from the neck at age 89   MYOMECTOMY     MYOMECTOMY  05/23/2012   Procedure: MYOMECTOMY;  Surgeon: Governor Specking, MD;  Location: [facility];  Service: Gynecology;  Laterality: N/A;    Current Outpatient Medications  Medication Sig Dispense Refill   aspirin EC 81 MG tablet Take 1 tablet (81 mg total) by mouth daily.     B Complex-C-Folic Acid (B-COMPLEX BALANCED PO) Take by mouth.     calcium carbonate 200 MG capsule Take 250 mg by mouth 2 (two) times daily with a meal.     Cholecalciferol (VITAMIN D) 125 MCG (5000 UT) CAPS Take 1 capsule by mouth daily.     doxycycline (VIBRA-TABS) 100 MG tablet Take 1 tablet (100 mg total) by mouth 2 (two) times daily. 20 tablet 0   estradiol (ESTRACE) 2 MG tablet Take 2 mg by mouth daily.     HYDROcodone-homatropine (HYCODAN) 5-1.5 MG/5ML syrup 1 tsp po qhs prn 120 mL 0   letrozole (FEMARA) 2.5 MG tablet Take 5 mg by mouth daily.     MAGNESIUM PO Take 1 tablet by mouth daily.     metFORMIN (GLUCOPHAGE-XR) 500 MG 24 hr tablet Take 1 tablet (500 mg total) by mouth daily with breakfast. 90 tablet 0   Nutritional Supplements (DHEA PO) Take 1 tablet by mouth daily.     Omega-3 Fatty Acids (FISH OIL) 1000 MG CAPS Take by mouth.     oseltamivir (TAMIFLU) 75 MG capsule Take 1 capsule (75 mg total) by mouth 2 (two) times daily. 10 capsule 0   Prenatal Vit-Fe Fumarate-FA (PRENATAL VITAMIN PO) Take 1 tablet by mouth daily.     Probiotic Product (PROBIOTIC-10 PO) Take by mouth.     progesterone (PROMETRIUM) 200 MG capsule Take 200 mg by  mouth 2 (two) times daily.     promethazine-dextromethorphan (PROMETHAZINE-DM) 6.25-15 MG/5ML syrup Take 5 mLs by mouth 4 (four) times daily as needed. 118 mL 0   pyridOXINE (VITAMIN B-6) 50 MG tablet Take 50 mg by mouth daily.     SELENIUM PO Take 1 tablet by mouth daily.     Vitamin D, Ergocalciferol, (DRISDOL) 1.25 MG (50000 UT) CAPS capsule Take 1 capsule (50,000 Units total) by mouth every 7 (seven) days. 4 capsule 0   VITAMIN E PO Take 1 tablet by mouth daily.     zinc gluconate 50 MG tablet Take 50 mg by mouth daily.     No current facility-administered medications for this visit.     Allergies as of 05/04/2019 - Review Complete 04/21/2019  Allergen Reaction Noted   Eggs or egg-derived products  09/15/2012   Metformin and related  02/09/2019   Sulfonamide derivatives Hives    Ganoderma lucidum  05/09/2018   Maitake  05/09/2018   Other  09/15/2012   Penicillins  Shellfish allergy Nausea And Vomiting 05/23/2012   Fish-derived products Rash 12/19/2015    Family History  Problem Relation Age of Onset   Hypertension Paternal Grandfather    Diabetes Paternal Grandfather    Diabetes Father    Hypotension Father    Colon cancer Paternal Grandmother    Diabetes Paternal Grandmother    Prostate cancer Maternal Grandfather    Hyperlipidemia Mother    Obesity Mother    Breast cancer Maternal Aunt    Arthritis Paternal Aunt    Hyperlipidemia Maternal Aunt        x's 2    Social History   Socioeconomic History   Marital status: Significant Other    Spouse name: Sharol Roussel   Number of children: Not on file   Years of education: Not on file   Highest education level: Not on file  Occupational History   Occupation: Manufacturing engineer: [employer]  Social Needs   Financial resource strain: Not on file   Food insecurity    Worry: Not on file    Inability: Not on file   Transportation needs    Medical: Not on file     Non-medical: Not on file  Tobacco Use   Smoking status: Never Smoker   Smokeless tobacco: Never Used  Substance and Sexual Activity   Alcohol use: Yes    Alcohol/week: 0.0 standard drinks    Comment: socially   Drug use: No   Sexual activity: Yes    Partners: Male    Birth control/protection: None  Lifestyle   Physical activity    Days per week: Not on file    Minutes per session: Not on file   Stress: Not on file  Relationships   Social connections    Talks on phone: Not on file    Gets together: Not on file    Attends religious service: Not on file    Active member of club or organization: Not on file    Attends meetings of clubs or organizations: Not on file    Relationship status: Not on file   Intimate partner violence    Fear of current or ex partner: Not on file    Emotionally abused: Not on file    Physically abused: Not on file    Forced sexual activity: Not on file  Other Topics Concern   Not on file  Social History Narrative   Exercise-personal trainer 3 days a week    Review of Systems: ALL ROS discussed and all others negative except listed in HPI.  Physical Exam: Complete physical exam not performed due to the limits inherent in a telehealth encounter.  General: Awake, alert, and oriented, and well communicative. In no acute distress.  HEENT: EOMI, non-icteric sclera, NCAT, MMM  Neck: Normal movement of head and neck  Pulm: No labored breathing, speaking in full sentences without conversational dyspnea  Derm: No apparent lesions or bruising in visible field  MS: Moves all visible extremities without noticeable abnormality  Psych: Pleasant, cooperative, normal speech, normal affect and normal insight Neuro: Alert and appropriate   Ilan Kahrs L. Tarri Glenn, MD, MPH [facility] 05/03/2019, 8:17 PM

## 2019-05-04 ENCOUNTER — Encounter: Payer: Self-pay | Admitting: Gastroenterology

## 2019-05-04 ENCOUNTER — Ambulatory Visit (INDEPENDENT_AMBULATORY_CARE_PROVIDER_SITE_OTHER): Payer: BC Managed Care – PPO | Admitting: Gastroenterology

## 2019-05-04 DIAGNOSIS — R109 Unspecified abdominal pain: Secondary | ICD-10-CM

## 2019-05-04 DIAGNOSIS — K5900 Constipation, unspecified: Secondary | ICD-10-CM

## 2019-05-04 NOTE — Patient Instructions (Addendum)
I have recommended an upper endoscopy.   If that is non-diagnostic, will plan a breath test for bacterial overgrowth.  Continue to take your probiotics daily between now and then.

## 2019-05-07 ENCOUNTER — Telehealth: Payer: Self-pay | Admitting: Gastroenterology

## 2019-05-07 ENCOUNTER — Ambulatory Visit (INDEPENDENT_AMBULATORY_CARE_PROVIDER_SITE_OTHER): Payer: BC Managed Care – PPO | Admitting: Physician Assistant

## 2019-05-07 NOTE — Telephone Encounter (Signed)

## 2019-05-08 ENCOUNTER — Ambulatory Visit (AMBULATORY_SURGERY_CENTER): Payer: BC Managed Care – PPO | Admitting: Gastroenterology

## 2019-05-08 ENCOUNTER — Other Ambulatory Visit: Payer: Self-pay

## 2019-05-08 ENCOUNTER — Encounter: Payer: Self-pay | Admitting: Gastroenterology

## 2019-05-08 VITALS — BP 130/73 | HR 75 | Temp 99.0°F | Resp 19 | Ht 67.0 in | Wt 287.0 lb

## 2019-05-08 DIAGNOSIS — K449 Diaphragmatic hernia without obstruction or gangrene: Secondary | ICD-10-CM

## 2019-05-08 DIAGNOSIS — K2970 Gastritis, unspecified, without bleeding: Secondary | ICD-10-CM

## 2019-05-08 DIAGNOSIS — K3189 Other diseases of stomach and duodenum: Secondary | ICD-10-CM | POA: Diagnosis not present

## 2019-05-08 DIAGNOSIS — K5900 Constipation, unspecified: Secondary | ICD-10-CM

## 2019-05-08 DIAGNOSIS — K2980 Duodenitis without bleeding: Secondary | ICD-10-CM

## 2019-05-08 DIAGNOSIS — R109 Unspecified abdominal pain: Secondary | ICD-10-CM

## 2019-05-08 DIAGNOSIS — K219 Gastro-esophageal reflux disease without esophagitis: Secondary | ICD-10-CM

## 2019-05-08 DIAGNOSIS — K228 Other specified diseases of esophagus: Secondary | ICD-10-CM | POA: Diagnosis not present

## 2019-05-08 MED ORDER — PANTOPRAZOLE SODIUM 20 MG PO TBEC: 20.0000 mg | DELAYED_RELEASE_TABLET | Freq: Two times a day (BID) | ORAL | 0 refills | Status: DC

## 2019-05-08 MED ORDER — SODIUM CHLORIDE 0.9 % IV SOLN: 500.0000 mL | Freq: Once | INTRAVENOUS | Status: DC

## 2019-05-08 NOTE — Op Note (Signed)
[place] Patient Name: Stacey Huerta Procedure Date: 05/08/2019 10:06 AM MRN: [id number] Endoscopist: Thornton Park MD, MD Age: 43 Referring MD:  Date of Birth: June 19, 1976 Gender: Female Account #: [id number] Procedure:                Upper GI endoscopy Indications:              Generalized abdominal pain and bloating Medicines:                See the Anesthesia note for documentation of the                            administered medications Procedure:                Pre-Anesthesia Assessment:                           - Prior to the procedure, a History and Physical                            was performed, and patient medications and                            allergies were reviewed. The patient's tolerance of                            previous anesthesia was also reviewed. The risks                            and benefits of the procedure and the sedation                            options and risks were discussed with the patient.                            All questions were answered, and informed consent                            was obtained. Prior Anticoagulants: The patient has                            taken no previous anticoagulant or antiplatelet                            agents. ASA Grade Assessment: II - A patient with                            mild systemic disease. After reviewing the risks                            and benefits, the patient was deemed in                            satisfactory condition to undergo the procedure.  After obtaining informed consent, the endoscope was                            passed under direct vision. Throughout the                            procedure, the patient's blood pressure, pulse, and                            oxygen saturations were monitored continuously. The                            Model GIF-HQ190 [id number]) scope was introduced                            through  the mouth, and advanced to the second part                            of duodenum. The upper GI endoscopy was                            accomplished without difficulty. The patient                            tolerated the procedure well. Scope In: Scope Out: Findings:                 The examined esophagus was normal. Biopsies were                            taken with a cold forceps for histology from the                            proximal, mid, and distal esophagus. Estimated                            blood loss was minimal.                           Two non-bleeding cratered gastric ulcers with no                            stigmata of bleeding were found in the gastric                            antrum. The largest lesion was 4 mm in largest                            dimension. Biopsies were taken with a cold forceps                            for histology. Estimated blood loss was minimal.  Multiple localized, non-bleeding erosions were                            found in the gastric body and in the gastric                            antrum. There were no stigmata of recent bleeding.                            Biopsies were taken with a cold forceps for                            histology. Estimated blood loss was minimal.                           A small hiatal hernia was present.                           Diffuse mildly erythematous mucosa without active                            bleeding and with no stigmata of bleeding was found                            in the duodenal bulb. Biopsies were taken with a                            cold forceps for histology. Estimated blood loss                            was minimal.                           The exam was otherwise without abnormality. Complications:            No immediate complications. Estimated blood loss:                            Minimal. Estimated Blood Loss:     Estimated blood loss was  minimal. Impression:               - Normal esophagus. Biopsied for eosinophilic                            esophagitis.                           - Non-bleeding gastric ulcers with no stigmata of                            bleeding. Biopsied.                           - Non-bleeding erosive gastropathy. Biopsied.                           -  Small hiatal hernia.                           - Erythematous duodenopathy. Biopsied.                           - The examination was otherwise normal.                           - Abdominal pain is likely related to gastric                            ulcers and erosions. Biopsies obtained to exclude H                            pylori. Recommendation:           - Patient has a contact number available for                            emergencies. The signs and symptoms of potential                            delayed complications were discussed with the                            patient. Return to normal activities tomorrow.                            Written discharge instructions were provided to the                            patient.                           - Resume regular diet.                           - No aspirin, ibuprofen, naproxen, or other                            non-steroidal anti-inflammatory drugs.                           - Use Protonix (pantoprazole) 20 mg PO BID for 8                            weeks to treat the ulcers and erosions.                           - Follow-up in 6 weeks to monitor response to                            therapy. If not improving, will proceed with  evalaution for bacterial overgrowth. Thornton Park MD, MD 05/08/2019 10:35:07 AM This report has been signed electronically.

## 2019-05-08 NOTE — Progress Notes (Signed)
Called to room to assist during endoscopic procedure.  Patient ID and intended procedure confirmed with present staff. Received instructions for my participation in the procedure from the performing physician.  

## 2019-05-08 NOTE — Patient Instructions (Signed)
YOU HAD AN ENDOSCOPIC PROCEDURE TODAY AT THE [facility]:   Refer to the procedure report that was given to you for any specific questions about what was found during the examination.  If the procedure report does not answer your questions, please call your gastroenterologist to clarify.  If you requested that your care partner not be given the details of your procedure findings, then the procedure report has been included in a sealed envelope for you to review at your convenience later.  YOU SHOULD EXPECT: Some feelings of bloating in the abdomen. Passage of more gas than usual.  Walking can help get rid of the air that was put into your GI tract during the procedure and reduce the bloating. If you had a lower endoscopy (such as a colonoscopy or flexible sigmoidoscopy) you may notice spotting of blood in your stool or on the toilet paper. If you underwent a bowel prep for your procedure, you may not have a normal bowel movement for a few days.  Please Note:  You might notice some irritation and congestion in your nose or some drainage.  This is from the oxygen used during your procedure.  There is no need for concern and it should clear up in a day or so.  SYMPTOMS TO REPORT IMMEDIATELY:   Following upper endoscopy (EGD)  Vomiting of blood or coffee ground material  New chest pain or pain under the shoulder blades  Painful or persistently difficult swallowing  New shortness of breath  Fever of 100F or higher  Black, tarry-looking stools  For urgent or emergent issues, a gastroenterologist can be reached at any hour by calling [phone].   DIET:  We do recommend a small meal at first, but then you may proceed to your regular diet.  Drink plenty of fluids but you should avoid alcoholic beverages for 24 hours.  ACTIVITY:  You should plan to take it easy for the rest of today and you should NOT DRIVE or use heavy machinery until tomorrow (because of the sedation medicines used  during the test).    FOLLOW UP: Our staff will call the number listed on your records 48-72 hours following your procedure to check on you and address any questions or concerns that you may have regarding the information given to you following your procedure. If we do not reach you, we will leave a message.  We will attempt to reach you two times.  During this call, we will ask if you have developed any symptoms of COVID 19. If you develop any symptoms (ie: fever, flu-like symptoms, shortness of breath, cough etc.) before then, please call [phone].  If you test positive for Covid 19 in the 2 weeks post procedure, please call and report this information to us.    If any biopsies were taken you will be contacted by phone or by letter within the next 1-3 weeks.  Please call us at [phone] if you have not heard about the biopsies in 3 weeks.    SIGNATURES/CONFIDENTIALITY: You and/or your care partner have signed paperwork which will be entered into your electronic medical record.  These signatures attest to the fact that that the information above on your After Visit Summary has been reviewed and is understood.  Full responsibility of the confidentiality of this discharge information lies with you and/or your care-partner. 

## 2019-05-08 NOTE — Progress Notes (Signed)
A and O x3. Report to RN. Tolerated MAC anesthesia well.Teeth unchanged after procedure.

## 2019-05-11 ENCOUNTER — Encounter: Payer: Self-pay | Admitting: *Deleted

## 2019-05-11 DIAGNOSIS — R109 Unspecified abdominal pain: Secondary | ICD-10-CM | POA: Diagnosis not present

## 2019-05-12 ENCOUNTER — Telehealth: Payer: Self-pay

## 2019-05-12 ENCOUNTER — Telehealth: Payer: Self-pay | Admitting: *Deleted

## 2019-05-12 NOTE — Telephone Encounter (Signed)
  Follow up Call-  Call back number 05/08/2019  Post procedure Call Back phone  # [phone]  Permission to leave phone message Yes  Some recent data might be hidden     Patient questions:  Do you have a fever, pain , or abdominal swelling? No. Pain Score  0 *  Have you tolerated food without any problems? Yes.    Have you been able to return to your normal activities? Yes.    Do you have any questions about your discharge instructions: Diet   No. Medications  No. Follow up visit  No.  Do you have questions or concerns about your Care? No.  Actions: * If pain score is 4 or above: No action needed, pain <4.  1. Have you developed a fever since your procedure? NO  2.   Have you had an respiratory symptoms (SOB or cough) since your procedure? NO  3.   Have you tested positive for COVID 19 since your procedure NO  4.   Have you had any family members/close contacts diagnosed with the COVID 19 since your procedure?  NO   If yes to any of these questions please route to Joylene John, RN and Alphonsa Gin, RN.

## 2019-05-12 NOTE — Telephone Encounter (Signed)
No answer, left message to call back later today, B.Guelda Batson RN. 

## 2019-05-13 ENCOUNTER — Other Ambulatory Visit (INDEPENDENT_AMBULATORY_CARE_PROVIDER_SITE_OTHER): Payer: Self-pay | Admitting: Physician Assistant

## 2019-05-13 ENCOUNTER — Encounter: Payer: Self-pay | Admitting: *Deleted

## 2019-05-13 ENCOUNTER — Encounter: Payer: Self-pay | Admitting: Gastroenterology

## 2019-05-13 ENCOUNTER — Telehealth: Payer: Self-pay | Admitting: *Deleted

## 2019-05-13 DIAGNOSIS — E559 Vitamin D deficiency, unspecified: Secondary | ICD-10-CM

## 2019-05-13 NOTE — Telephone Encounter (Signed)
Patient notified via MyChart of Dr. Tarri Glenn new recommendations. Letter copied and pasted to a patient message.

## 2019-05-14 ENCOUNTER — Telehealth: Payer: Self-pay | Admitting: *Deleted

## 2019-05-14 NOTE — Telephone Encounter (Signed)
Laboratory requisition filled out and faxed to [facility] for the SIBO breath test. Patient notified via MyChart to come retrieve the kit along with the shipping label. Kit left at the front desk for the patient.

## 2019-05-16 ENCOUNTER — Encounter: Payer: Self-pay | Admitting: Gastroenterology

## 2019-05-19 ENCOUNTER — Ambulatory Visit (INDEPENDENT_AMBULATORY_CARE_PROVIDER_SITE_OTHER): Payer: BC Managed Care – PPO | Admitting: Physician Assistant

## 2019-05-19 ENCOUNTER — Other Ambulatory Visit: Payer: Self-pay

## 2019-05-19 ENCOUNTER — Encounter (INDEPENDENT_AMBULATORY_CARE_PROVIDER_SITE_OTHER): Payer: Self-pay | Admitting: Physician Assistant

## 2019-05-19 VITALS — BP 104/69 | Temp 98.3°F | Ht 70.0 in | Wt 289.0 lb

## 2019-05-19 DIAGNOSIS — Z6841 Body Mass Index (BMI) 40.0 and over, adult: Secondary | ICD-10-CM

## 2019-05-19 DIAGNOSIS — E559 Vitamin D deficiency, unspecified: Secondary | ICD-10-CM

## 2019-05-19 DIAGNOSIS — R0602 Shortness of breath: Secondary | ICD-10-CM | POA: Diagnosis not present

## 2019-05-19 DIAGNOSIS — Z9189 Other specified personal risk factors, not elsewhere classified: Secondary | ICD-10-CM | POA: Diagnosis not present

## 2019-05-19 DIAGNOSIS — E8881 Metabolic syndrome: Secondary | ICD-10-CM

## 2019-05-19 MED ORDER — VITAMIN D (ERGOCALCIFEROL) 1.25 MG (50000 UNIT) PO CAPS: 50000.0000 [IU] | ORAL_CAPSULE | ORAL | 0 refills | Status: DC

## 2019-05-19 NOTE — Progress Notes (Signed)
Office: [phone]  /  Fax: [phone]   HPI:   Chief Complaint: OBESITY Stacey Huerta is here to discuss her progress with her obesity treatment plan. She is keeping a food journal with 1500 calories and 80-100 grams of protein and is following her eating plan approximately 75% of the time. She states she is exercising 0 minutes 0 times per week. Stacey Huerta reports that she had an appointment with GI who diagnosed her with an ulcer and she is now on Protonix. She states she has stopped working out for now. Her weight is 289 lb (131.1 kg) today and has had a weight gain of 11 lbs since her last in-office visit 6 months ago. She has lost 1 lb since starting treatment with [place].  Vitamin D deficiency Stacey Huerta has a diagnosis of Vitamin D deficiency. She is currently taking prescription Vit D and denies nausea, vomiting or muscle weakness.  Insulin Resistance Stacey Huerta has a diagnosis of insulin resistance based on her elevated fasting insulin level >5. Although Stacey Huerta's blood glucose readings are still under good control, insulin resistance puts her at greater risk of metabolic syndrome and diabetes. She is taking metformin currently and continues to work on diet and exercise to decrease risk of diabetes. No nausea, vomiting, or diarrhea. No polyphagia.  At risk for diabetes Stacey Huerta is at higher than average risk for developing diabetes due to her obesity. She currently denies polyuria or polydipsia.  Shortness of Breath Stacey Huerta notes increasing shortness of breath on exertion and seems to be worsening over time with weight gain. She notes getting out of breath sooner with activity than she used to. Stacey Huerta denies dizziness or lightheadedness.  ASSESSMENT AND PLAN:  Vitamin D deficiency - Plan: Vitamin D, Ergocalciferol, (DRISDOL) 1.25 MG (50000 UT) CAPS capsule  Insulin resistance  At risk for diabetes mellitus  Class 3 severe obesity with serious comorbidity and body mass index (BMI) of 45.0  to 49.9 in adult, unspecified obesity type ([place])  PLAN:  Vitamin D Deficiency Stacey Huerta was informed that low Vitamin D levels contributes to fatigue and are associated with obesity, breast, and colon cancer. She agrees to continue to take prescription Vit D @ 50,000 IU every week #4 with 0 refills and will follow-up for routine testing of Vitamin D, at least 2-3 times per year. She was informed of the risk of over-replacement of Vitamin D and agrees to not increase her dose unless she discusses this with [place] first. Stacey Huerta agrees to follow-up with our clinic in 2 weeks.  Insulin Resistance Stacey Huerta will continue to work on weight loss, exercise, and decreasing simple carbohydrates in her diet to help decrease the risk of diabetes. We dicussed metformin including benefits and risks. She was informed that eating too many simple carbohydrates or too many calories at one sitting increases the likelihood of GI side effects. Stacey Huerta will continue with medication and weight loss. She will follow-up with [place] as directed to monitor her progress.  Diabetes risk counseling Stacey Huerta was given extended (15 minutes) diabetes prevention counseling today. She is 43 y.o. female and has risk factors for diabetes including obesity. We discussed intensive lifestyle modifications today with an emphasis on weight loss as well as increasing exercise and decreasing simple carbohydrates in her diet.  Shortness of Breath Stacey Huerta's shortness of breath appears to be obesity related and exercise induced. The indirect calorimeter results showed VO2 of 238 and a REE of 1658. She has agreed to work on weight loss and gradually increase exercise to  treat her exercise induced shortness of breath. If Stacey Huerta follows our instructions and loses weight without improvement of her shortness of breath, we will plan to refer to pulmonology. Stacey Huerta agrees to this plan.  Obesity Stacey Huerta is currently in the action stage of change. As such, her  goal is to continue with weight loss efforts. She has agreed to change and keep a food journal with 1200 calories and 90 grams of protein daily. Stacey Huerta has been instructed to work up to a goal of 150 minutes of combined cardio and strengthening exercise per week for weight loss and overall health benefits. We discussed the following Behavioral Modification Stratagies today: work on meal planning and easy cooking plans, and keeping healthy foods in the home.  Stacey Huerta has agreed to follow-up with our clinic in 2 weeks. She was informed of the importance of frequent follow-up visits to maximize her success with intensive lifestyle modifications for her multiple health conditions.  ALLERGIES: Allergies  Allergen Reactions   Eggs Or Egg-Derived Products    Metformin And Related     Possible lactic acidosis   Sulfonamide Derivatives Hives    Also high fever   Ganoderma Lucidum    [place]    Other     Mushrooms and [place] Nuts   Penicillins     Childhood reaction   Shellfish Allergy Nausea And Vomiting   Fish-Derived Products Rash    MEDICATIONS: Current Outpatient Medications on File Prior to Visit  Medication Sig Dispense Refill   aspirin EC 81 MG tablet Take 1 tablet (81 mg total) by mouth daily.     B Complex-C-Folic Acid (B-COMPLEX BALANCED PO) Take by mouth.     calcium carbonate 200 MG capsule Take 250 mg by mouth 2 (two) times daily with a meal.     Cholecalciferol (VITAMIN D) 125 MCG (5000 UT) CAPS Take 1 capsule by mouth daily.     estradiol (ESTRACE) 2 MG tablet Take 2 mg by mouth daily.     letrozole (FEMARA) 2.5 MG tablet Take 5 mg by mouth daily.     MAGNESIUM PO Take 1 tablet by mouth daily.     metFORMIN (GLUCOPHAGE-XR) 500 MG 24 hr tablet Take 1 tablet (500 mg total) by mouth daily with breakfast. 90 tablet 0   Nutritional Supplements (DHEA PO) Take 1 tablet by mouth daily.     Omega-3 Fatty Acids (FISH OIL) 1000 MG CAPS Take by mouth.      pantoprazole (PROTONIX) 20 MG tablet Take 1 tablet (20 mg total) by mouth 2 (two) times daily. 120 tablet 0   Prenatal Vit-Fe Fumarate-FA (PRENATAL VITAMIN PO) Take 1 tablet by mouth daily.     Probiotic Product (PROBIOTIC-10 PO) Take by mouth.     progesterone (PROMETRIUM) 200 MG capsule Take 200 mg by mouth 2 (two) times daily.     pyridOXINE (VITAMIN B-6) 50 MG tablet Take 50 mg by mouth daily.     SELENIUM PO Take 1 tablet by mouth daily.     Vitamin D, Ergocalciferol, (DRISDOL) 1.25 MG (50000 UT) CAPS capsule Take 1 capsule (50,000 Units total) by mouth every 7 (seven) days. 4 capsule 0   VITAMIN E PO Take 1 tablet by mouth daily.     zinc gluconate 50 MG tablet Take 50 mg by mouth daily.     No current facility-administered medications on file prior to visit.     PAST MEDICAL HISTORY: Past Medical History:  Diagnosis Date   Allergy  Back pain    Dyspnea    Fibroid    Headache(784.0)    Hypertension during pregnancy    Infertility, female    Leg edema    Osteoarthritis    Postpartum depression    Sjogren's syndrome ([place]) 10/28/2015   Vitamin B 12 deficiency    Vitamin D deficiency     PAST SURGICAL HISTORY: Past Surgical History:  Procedure Laterality Date   ABDOMINAL ADHESION SURGERY     CESAREAN SECTION N/A 03/18/2014   Procedure: CESAREAN SECTION;  Surgeon: Delice Lesch, MD;  Location: [facility];  Service: Obstetrics;  Laterality: N/A;  spinal/epidural   CHOLECYSTECTOMY     ENDOMETRIAL ABLATION     LYMPHADENECTOMY     Removed from the neck at age 83   MYOMECTOMY     MYOMECTOMY  05/23/2012   Procedure: MYOMECTOMY;  Surgeon: Governor Specking, MD;  Location: [facility];  Service: Gynecology;  Laterality: N/A;    SOCIAL HISTORY: Social History   Tobacco Use   Smoking status: Never Smoker   Smokeless tobacco: Never Used  Substance Use Topics   Alcohol use: Yes    Alcohol/week: 0.0 standard drinks    Comment: socially   Drug use: No      FAMILY HISTORY: Family History  Problem Relation Age of Onset   Hypertension Paternal Grandfather    Diabetes Paternal Grandfather    Diabetes Father    Hypotension Father    Colon cancer Paternal Grandmother    Diabetes Paternal Grandmother    Prostate cancer Maternal Grandfather    Hyperlipidemia Mother    Obesity Mother    Breast cancer Maternal Aunt    Arthritis Paternal Aunt    Hyperlipidemia Maternal Aunt        x's 2   ROS: Review of Systems  Gastrointestinal: Negative for diarrhea, nausea and vomiting.  Musculoskeletal:       Negative for muscle weakness.  Neurological: Negative for dizziness.       Negative for lightheadedness.  Endo/Heme/Allergies:       Negative for polyphagia.   PHYSICAL EXAM: Blood pressure 104/69, temperature 98.3 F (36.8 C), height 5\' 10"  (1.778 m), weight 289 lb (131.1 kg), SpO2 100 %. Body mass index is 41.47 kg/m. Physical Exam Vitals signs reviewed.  Constitutional:      Appearance: Normal appearance. She is obese.  Cardiovascular:     Rate and Rhythm: Normal rate.     Pulses: Normal pulses.  Pulmonary:     Effort: Pulmonary effort is normal.     Breath sounds: Normal breath sounds.  Musculoskeletal: Normal range of motion.  Skin:    General: Skin is warm and dry.  Neurological:     Mental Status: She is alert and oriented to person, place, and time.  Psychiatric:        Behavior: Behavior normal.   RECENT LABS AND TESTS: BMET    Component Value Date/Time   NA 139 03/05/2019 1003   K 4.5 03/05/2019 1003   CL 104 03/05/2019 1003   CO2 22 03/05/2019 1003   GLUCOSE 82 03/05/2019 1003   GLUCOSE 104 (H) 05/02/2018 1216   BUN 12 03/05/2019 1003   CREATININE 0.87 03/05/2019 1003   CREATININE 0.98 08/19/2014 1223   CALCIUM 9.1 03/05/2019 1003   GFRNONAA 81 03/05/2019 1003   GFRAA 94 03/05/2019 1003   Lab Results  Component Value Date   HGBA1C 5.0 03/05/2019   HGBA1C 4.9 10/07/2018   HGBA1C 5.0  06/26/2018  HGBA1C 5.2 12/19/2015   HGBA1C 5.2 05/31/2008   Lab Results  Component Value Date   INSULIN 21.8 03/05/2019   INSULIN 10.5 10/07/2018   INSULIN 15.4 06/26/2018   CBC    Component Value Date/Time   WBC 4.4 06/26/2018 1130   WBC 4.1 05/02/2018 1216   RBC 4.38 06/26/2018 1130   RBC 4.23 05/02/2018 1216   HGB 13.6 06/26/2018 1130   HCT 41.1 06/26/2018 1130   PLT 182.0 05/02/2018 1216   MCV 94 06/26/2018 1130   MCH 31.1 06/26/2018 1130   MCH 30.0 08/19/2014 1226   MCH 32.4 03/20/2014 0812   MCHC 33.1 06/26/2018 1130   MCHC 33.6 05/02/2018 1216   RDW 12.0 (L) 06/26/2018 1130   LYMPHSABS 1.3 06/26/2018 1130   MONOABS 0.3 05/02/2018 1216   EOSABS 0.0 06/26/2018 1130   BASOSABS 0.0 06/26/2018 1130   Iron/TIBC/Ferritin/ %Sat No results found for: IRON, TIBC, FERRITIN, IRONPCTSAT Lipid Panel     Component Value Date/Time   CHOL 168 10/07/2018 1149   TRIG 106 10/07/2018 1149   HDL 50 10/07/2018 1149   CHOLHDL 4 12/19/2015 0852   VLDL 20.2 12/19/2015 0852   LDLCALC 97 10/07/2018 1149   Hepatic Function Panel     Component Value Date/Time   PROT 7.1 03/05/2019 1003   ALBUMIN 3.7 (L) 03/05/2019 1003   AST 13 03/05/2019 1003   ALT 11 03/05/2019 1003   ALKPHOS 105 03/05/2019 1003   BILITOT 0.6 03/05/2019 1003   BILIDIR 0.1 12/14/2014 1402   IBILI 0.9 04/30/2008 2117      Component Value Date/Time   TSH 2.420 06/26/2018 1130   TSH 1.59 05/02/2018 1216   TSH 1.34 12/14/2014 1402   Results for MARJORY, MEINTS (MRN [id number]) as of 05/19/2019 14:56  Ref. Range 03/05/2019 10:03  Vitamin D, 25-Hydroxy Latest Ref Range: 30.0 - 100.0 ng/mL 53.5   OBESITY BEHAVIORAL INTERVENTION VISIT  Today's visit was #16  Starting weight: 290 lbs Starting date: 06/26/2018 Today's weight: 289 lbs  Today's date: 05/19/2019 Total lbs lost to date: 1    05/19/2019  Height 5\' 10"  (1.778 m)  Weight 289 lb (131.1 kg)  BMI (Calculated) 41.47  BLOOD PRESSURE - SYSTOLIC  419  BLOOD PRESSURE - DIASTOLIC 69   Body Fat % 37.9 %  Total Body Water (lbs) 103.21 lbs  RMR 1658   ASK: We discussed the diagnosis of obesity with Stacey Huerta today and Stacey Huerta agreed to give [place] permission to discuss obesity behavioral modification therapy today.  ASSESS: Stacey Huerta has the diagnosis of obesity and her BMI today is 45.3. Stacey Huerta is in the action stage of change.   ADVISE: Stacey Huerta was educated on the multiple health risks of obesity as well as the benefit of weight loss to improve her health. She was advised of the need for long term treatment and the importance of lifestyle modifications to improve her current health and to decrease her risk of future health problems.  AGREE: Multiple dietary modification options and treatment options were discussed and  Stacey Huerta agreed to follow the recommendations documented in the above note.  ARRANGE: Stacey Huerta was educated on the importance of frequent visits to treat obesity as outlined per CMS and USPSTF guidelines and agreed to schedule her next follow up appointment today.  Migdalia Dk, am acting as transcriptionist for Abby Potash, PA-C I, Abby Potash, PA-C have reviewed above note and agree with its content

## 2019-05-21 ENCOUNTER — Other Ambulatory Visit: Payer: Self-pay | Admitting: *Deleted

## 2019-05-21 MED ORDER — PANTOPRAZOLE SODIUM 40 MG PO TBEC: 40.0000 mg | DELAYED_RELEASE_TABLET | Freq: Two times a day (BID) | ORAL | 0 refills | Status: DC

## 2019-05-25 ENCOUNTER — Other Ambulatory Visit: Payer: Self-pay | Admitting: *Deleted

## 2019-05-25 ENCOUNTER — Telehealth: Payer: Self-pay | Admitting: *Deleted

## 2019-05-25 DIAGNOSIS — R140 Abdominal distension (gaseous): Secondary | ICD-10-CM

## 2019-05-25 MED ORDER — DEXLANSOPRAZOLE 60 MG PO CPDR: 60.0000 mg | DELAYED_RELEASE_CAPSULE | Freq: Every day | ORAL | 0 refills | Status: DC

## 2019-05-25 NOTE — Telephone Encounter (Signed)
Once daily for 8 weeks. However, she may want to start with a smaller amount if there is any concern that she will not tolerate it. Thanks.

## 2019-05-25 NOTE — Telephone Encounter (Signed)
CT scheduled on 05/29/2019 at 3pm with an arrival time of 2:45 pm. The patient is to be NPO for 4 hours prior on the day of the scan. Go to [facility] radiology to pick up 2 bottles of contrast. Drink one bottle at 1:00 pm and the other bottle at 2:00.   Dr. Tarri Glenn, the patient wants to pay for the Dexilant 60 mg OOP, I will sent the prescription. Please advise on how many times daily and how long?

## 2019-05-25 NOTE — Telephone Encounter (Signed)
Prescription sent to the pharmacy.   Patient notified via MyChart of CT instructions and prescription.

## 2019-05-29 ENCOUNTER — Other Ambulatory Visit: Payer: Self-pay

## 2019-05-29 ENCOUNTER — Ambulatory Visit (HOSPITAL_COMMUNITY)
Admission: RE | Admit: 2019-05-29 | Discharge: 2019-05-29 | Disposition: A | Discharge status: Home / Self Care | Payer: BC Managed Care – PPO | Source: Ambulatory Visit | Attending: Gastroenterology | Admitting: Gastroenterology

## 2019-05-29 DIAGNOSIS — R140 Abdominal distension (gaseous): Secondary | ICD-10-CM | POA: Insufficient documentation

## 2019-05-29 DIAGNOSIS — K5730 Diverticulosis of large intestine without perforation or abscess without bleeding: Secondary | ICD-10-CM | POA: Diagnosis not present

## 2019-05-29 MED ORDER — SODIUM CHLORIDE (PF) 0.9 % IJ SOLN
INTRAMUSCULAR | Status: AC
  Filled 2019-05-29: qty 50

## 2019-05-29 MED ORDER — IOHEXOL 300 MG/ML  SOLN
100.0000 mL | Freq: Once | INTRAMUSCULAR | Status: AC | PRN
  Administered 2019-05-29: 100 mL via INTRAVENOUS

## 2019-05-30 ENCOUNTER — Other Ambulatory Visit: Payer: Self-pay | Admitting: Gastroenterology

## 2019-06-01 ENCOUNTER — Encounter: Payer: Self-pay | Admitting: *Deleted

## 2019-06-02 ENCOUNTER — Other Ambulatory Visit: Payer: Self-pay | Admitting: *Deleted

## 2019-06-02 ENCOUNTER — Encounter: Payer: Self-pay | Admitting: *Deleted

## 2019-06-02 DIAGNOSIS — N978 Female infertility of other origin: Secondary | ICD-10-CM | POA: Diagnosis not present

## 2019-06-02 MED ORDER — RIFAXIMIN 550 MG PO TABS: 550.0000 mg | ORAL_TABLET | Freq: Three times a day (TID) | ORAL | 0 refills | Status: AC

## 2019-06-03 ENCOUNTER — Other Ambulatory Visit: Payer: Self-pay

## 2019-06-03 ENCOUNTER — Encounter (INDEPENDENT_AMBULATORY_CARE_PROVIDER_SITE_OTHER): Payer: Self-pay | Admitting: Physician Assistant

## 2019-06-03 ENCOUNTER — Ambulatory Visit (INDEPENDENT_AMBULATORY_CARE_PROVIDER_SITE_OTHER): Payer: BC Managed Care – PPO | Admitting: Physician Assistant

## 2019-06-03 DIAGNOSIS — Z6841 Body Mass Index (BMI) 40.0 and over, adult: Secondary | ICD-10-CM

## 2019-06-03 DIAGNOSIS — E559 Vitamin D deficiency, unspecified: Secondary | ICD-10-CM | POA: Diagnosis not present

## 2019-06-03 DIAGNOSIS — E66813 Obesity, class 3: Secondary | ICD-10-CM

## 2019-06-04 NOTE — Progress Notes (Signed)
Office: [phone]  /  Fax: [phone] TeleHealth Visit:  Stacey Huerta has verbally consented to this TeleHealth visit today. The patient is located at home, the provider is located at the [facility] office. The participants in this visit include the listed provider and patient. The visit was conducted today via Webex.  HPI:   Chief Complaint: OBESITY Stacey Huerta is here to discuss her progress with her obesity treatment plan. She is keeping a food journal with 1200 calories and 90 grams of protein and is following her eating plan approximately 99% of the time. She states she is exercising 0 minutes 0 times per week. Stacey Huerta reports her most recent weight to be 286 lbs. She is having a difficult time reaching her protein goal daily and is getting about 70 grams daily. We were unable to weigh the patient today for this TeleHealth visit. She feels as if she has lost 3 lbs since her last visit. She has lost 1 lb since starting treatment with [place].  Vitamin D deficiency Stacey Huerta has a diagnosis of Vitamin D deficiency. She is currently taking Vit D and denies nausea, vomiting or muscle weakness.  ASSESSMENT AND PLAN:  Vitamin D deficiency  Class 3 severe obesity with serious comorbidity and body mass index (BMI) of 40.0 to 44.9 in adult, unspecified obesity type (Stacey Huerta)  PLAN:  Vitamin D Deficiency Stacey Huerta was informed that low Vitamin D levels contributes to fatigue and are associated with obesity, breast, and colon cancer. She agrees to continue taking Vit D and will follow-up for routine testing of Vitamin D, at least 2-3 times per year. She was informed of the risk of over-replacement of Vitamin D and agrees to not increase her dose unless she discusses this with [place] first. Stacey Huerta agrees to follow-up with our clinic in 2 weeks.  Obesity Stacey Huerta is currently in the action stage of change. As such, her goal is to continue with weight loss efforts. She has agreed to  keep a food journal with 1200 calories and 90 grams of protein daily. Stacey Huerta has been instructed to work up to a goal of 150 minutes of combined cardio and strengthening exercise per week for weight loss and overall health benefits. We discussed the following Behavioral Modification Strategies today: increasing lean protein intake, work on meal planning and easy cooking plans.  Guillermo has agreed to follow-up with our clinic in 2 weeks. She was informed of the importance of frequent follow-up visits to maximize her success with intensive lifestyle modifications for her multiple health conditions.  ALLERGIES: Allergies  Allergen Reactions   Eggs Or Egg-Derived Products    Metformin And Related     Possible lactic acidosis   Sulfonamide Derivatives Hives    Also high fever   Ganoderma Lucidum    Maitake    Other     Mushrooms and [place] Nuts   Penicillins     Childhood reaction   Shellfish Allergy Nausea And Vomiting   Fish-Derived Products Rash    MEDICATIONS: Current Outpatient Medications on File Prior to Visit  Medication Sig Dispense Refill   aspirin EC 81 MG tablet Take 1 tablet (81 mg total) by mouth daily.     B Complex-C-Folic Acid (B-COMPLEX BALANCED PO) Take by mouth.     calcium carbonate 200 MG capsule Take 250 mg by mouth 2 (two) times daily with a meal.     Cholecalciferol (VITAMIN D) 125 MCG (5000 UT) CAPS Take 1 capsule by mouth daily.  dexlansoprazole (DEXILANT) 60 MG capsule Take 1 capsule (60 mg total) by mouth daily. 60 capsule 0   estradiol (ESTRACE) 2 MG tablet Take 2 mg by mouth daily.     letrozole (FEMARA) 2.5 MG tablet Take 5 mg by mouth daily.     MAGNESIUM PO Take 1 tablet by mouth daily.     metFORMIN (GLUCOPHAGE-XR) 500 MG 24 hr tablet Take 1 tablet (500 mg total) by mouth daily with breakfast. 90 tablet 0   Nutritional Supplements (DHEA PO) Take 1 tablet by mouth daily.     Omega-3 Fatty Acids (FISH OIL) 1000 MG CAPS Take by  mouth.     pantoprazole (PROTONIX) 40 MG tablet Take 1 tablet (40 mg total) by mouth 2 (two) times daily. 120 tablet 0   Prenatal Vit-Fe Fumarate-FA (PRENATAL VITAMIN PO) Take 1 tablet by mouth daily.     Probiotic Product (PROBIOTIC-10 PO) Take by mouth.     progesterone (PROMETRIUM) 200 MG capsule Take 200 mg by mouth 2 (two) times daily.     pyridOXINE (VITAMIN B-6) 50 MG tablet Take 50 mg by mouth daily.     rifaximin (XIFAXAN) 550 MG TABS tablet Take 1 tablet (550 mg total) by mouth 3 (three) times daily for 14 days. 42 tablet 0   SELENIUM PO Take 1 tablet by mouth daily.     Vitamin D, Ergocalciferol, (DRISDOL) 1.25 MG (50000 UT) CAPS capsule Take 1 capsule (50,000 Units total) by mouth every 7 (seven) days. 4 capsule 0   VITAMIN E PO Take 1 tablet by mouth daily.     zinc gluconate 50 MG tablet Take 50 mg by mouth daily.     No current facility-administered medications on file prior to visit.     PAST MEDICAL HISTORY: Past Medical History:  Diagnosis Date   Allergy    Back pain    Dyspnea    Fibroid    Headache(784.0)    Hypertension during pregnancy    Infertility, female    Leg edema    Osteoarthritis    Postpartum depression    Sjogren's syndrome ([place]) 10/28/2015   Vitamin B 12 deficiency    Vitamin D deficiency     PAST SURGICAL HISTORY: Past Surgical History:  Procedure Laterality Date   ABDOMINAL ADHESION SURGERY     CESAREAN SECTION N/A 03/18/2014   Procedure: CESAREAN SECTION;  Surgeon: Delice Lesch, MD;  Location: [facility];  Service: Obstetrics;  Laterality: N/A;  spinal/epidural   CHOLECYSTECTOMY     ENDOMETRIAL ABLATION     LYMPHADENECTOMY     Removed from the neck at age 46   MYOMECTOMY     MYOMECTOMY  05/23/2012   Procedure: MYOMECTOMY;  Surgeon: Governor Specking, MD;  Location: [facility];  Service: Gynecology;  Laterality: N/A;    SOCIAL HISTORY: Social History   Tobacco Use   Smoking status: Never Smoker   Smokeless  tobacco: Never Used  Substance Use Topics   Alcohol use: Yes    Alcohol/week: 0.0 standard drinks    Comment: socially   Drug use: No    FAMILY HISTORY: Family History  Problem Relation Age of Onset   Hypertension Paternal Grandfather    Diabetes Paternal Grandfather    Diabetes Father    Hypotension Father    Colon cancer Paternal Grandmother    Diabetes Paternal Grandmother    Prostate cancer Maternal Grandfather    Hyperlipidemia Mother    Obesity Mother    Breast cancer Maternal  Aunt    Arthritis Paternal Aunt    Hyperlipidemia Maternal Aunt        x's 2   ROS: Review of Systems  Gastrointestinal: Negative for nausea and vomiting.  Musculoskeletal:       Negative for muscle weakness.   PHYSICAL EXAM: Pt in no acute distress  RECENT LABS AND TESTS: BMET    Component Value Date/Time   NA 139 03/05/2019 1003   K 4.5 03/05/2019 1003   CL 104 03/05/2019 1003   CO2 22 03/05/2019 1003   GLUCOSE 82 03/05/2019 1003   GLUCOSE 104 (H) 05/02/2018 1216   BUN 12 03/05/2019 1003   CREATININE 0.87 03/05/2019 1003   CREATININE 0.98 08/19/2014 1223   CALCIUM 9.1 03/05/2019 1003   GFRNONAA 81 03/05/2019 1003   GFRAA 94 03/05/2019 1003   Lab Results  Component Value Date   HGBA1C 5.0 03/05/2019   HGBA1C 4.9 10/07/2018   HGBA1C 5.0 06/26/2018   HGBA1C 5.2 12/19/2015   HGBA1C 5.2 05/31/2008   Lab Results  Component Value Date   INSULIN 21.8 03/05/2019   INSULIN 10.5 10/07/2018   INSULIN 15.4 06/26/2018   CBC    Component Value Date/Time   WBC 4.4 06/26/2018 1130   WBC 4.1 05/02/2018 1216   RBC 4.38 06/26/2018 1130   RBC 4.23 05/02/2018 1216   HGB 13.6 06/26/2018 1130   HCT 41.1 06/26/2018 1130   PLT 182.0 05/02/2018 1216   MCV 94 06/26/2018 1130   MCH 31.1 06/26/2018 1130   MCH 30.0 08/19/2014 1226   MCH 32.4 03/20/2014 0812   MCHC 33.1 06/26/2018 1130   MCHC 33.6 05/02/2018 1216   RDW 12.0 (L) 06/26/2018 1130   LYMPHSABS 1.3 06/26/2018  1130   MONOABS 0.3 05/02/2018 1216   EOSABS 0.0 06/26/2018 1130   BASOSABS 0.0 06/26/2018 1130   Iron/TIBC/Ferritin/ %Sat No results found for: IRON, TIBC, FERRITIN, IRONPCTSAT Lipid Panel     Component Value Date/Time   CHOL 168 10/07/2018 1149   TRIG 106 10/07/2018 1149   HDL 50 10/07/2018 1149   CHOLHDL 4 12/19/2015 0852   VLDL 20.2 12/19/2015 0852   LDLCALC 97 10/07/2018 1149   Hepatic Function Panel     Component Value Date/Time   PROT 7.1 03/05/2019 1003   ALBUMIN 3.7 (L) 03/05/2019 1003   AST 13 03/05/2019 1003   ALT 11 03/05/2019 1003   ALKPHOS 105 03/05/2019 1003   BILITOT 0.6 03/05/2019 1003   BILIDIR 0.1 12/14/2014 1402   IBILI 0.9 04/30/2008 2117      Component Value Date/Time   TSH 2.420 06/26/2018 1130   TSH 1.59 05/02/2018 1216   TSH 1.34 12/14/2014 1402   Results for ELSEY, HOLTS AVA (MRN [id number]) as of 06/04/2019 11:26  Ref. Range 03/05/2019 10:03  Vitamin D, 25-Hydroxy Latest Ref Range: 30.0 - 100.0 ng/mL 53.5   I, Michaelene Song, am acting as Location manager for [employer], PA-C I, Abby Potash, PA-C have reviewed above note and agree with its content

## 2019-06-05 ENCOUNTER — Other Ambulatory Visit: Payer: Self-pay

## 2019-06-05 ENCOUNTER — Ambulatory Visit (INDEPENDENT_AMBULATORY_CARE_PROVIDER_SITE_OTHER): Payer: BC Managed Care – PPO

## 2019-06-05 DIAGNOSIS — E538 Deficiency of other specified B group vitamins: Secondary | ICD-10-CM | POA: Diagnosis not present

## 2019-06-05 MED ORDER — CYANOCOBALAMIN 1000 MCG/ML IJ SOLN
1000.0000 ug | INTRAMUSCULAR | Status: DC
  Administered 2019-06-05: 1000 ug via INTRAMUSCULAR

## 2019-06-05 NOTE — Progress Notes (Signed)
Pt here for B12 shot per Dr. Etter Sjogren.  25mL b12 injected into patients left deltoid IM. Patient tolerated well.  Next injection scheduled for 07/06/2019 at 10:00 am

## 2019-06-13 ENCOUNTER — Other Ambulatory Visit (INDEPENDENT_AMBULATORY_CARE_PROVIDER_SITE_OTHER): Payer: Self-pay | Admitting: Physician Assistant

## 2019-06-13 DIAGNOSIS — E559 Vitamin D deficiency, unspecified: Secondary | ICD-10-CM

## 2019-06-15 ENCOUNTER — Telehealth (INDEPENDENT_AMBULATORY_CARE_PROVIDER_SITE_OTHER): Payer: BC Managed Care – PPO | Admitting: Physician Assistant

## 2019-06-15 ENCOUNTER — Encounter: Payer: Self-pay | Admitting: Emergency Medicine

## 2019-06-15 ENCOUNTER — Other Ambulatory Visit: Payer: Self-pay | Admitting: Gastroenterology

## 2019-06-16 ENCOUNTER — Encounter: Payer: Self-pay | Admitting: Gastroenterology

## 2019-06-16 ENCOUNTER — Other Ambulatory Visit: Payer: Self-pay

## 2019-06-16 ENCOUNTER — Ambulatory Visit (INDEPENDENT_AMBULATORY_CARE_PROVIDER_SITE_OTHER): Payer: BC Managed Care – PPO | Admitting: Gastroenterology

## 2019-06-16 VITALS — Ht 70.0 in | Wt 286.0 lb

## 2019-06-16 DIAGNOSIS — R140 Abdominal distension (gaseous): Secondary | ICD-10-CM | POA: Diagnosis not present

## 2019-06-16 DIAGNOSIS — K6389 Other specified diseases of intestine: Secondary | ICD-10-CM

## 2019-06-16 DIAGNOSIS — K259 Gastric ulcer, unspecified as acute or chronic, without hemorrhage or perforation: Secondary | ICD-10-CM | POA: Diagnosis not present

## 2019-06-16 DIAGNOSIS — R109 Unspecified abdominal pain: Secondary | ICD-10-CM | POA: Diagnosis not present

## 2019-06-16 NOTE — Progress Notes (Signed)
TELEHEALTH VISIT  Referring Provider: Ann Held, * Primary Care Physician:  Carollee Herter, Alferd Apa, DO   Tele-visit due to COVID-19 pandemic Patient requested visit virtually, consented to the virtual encounter via video enabled telemedicine application (Zoom) Contact made at: 06/16/19 14:10 Patient verified by name and date of birth Location of patient: Home Location provider: [place] [facility] Names of persons participating: Me, patient, Tinnie Gens CMA Time spent on telehealth visit: 28 minutes I discussed the limitations of evaluation and management by telemedicine. The patient expressed understanding and agreed to proceed.  Chief complaint:  Bloating, Constipation   IMPRESSION:  Small intestinal bacterial overgrowth presenting with abdominal pain and bloating    - evaluated by Dr. Augustin Coupe in 2016 and 2019     -High-quality colonoscopy 06/15/2015 showed no source of pain    -Small bowel follow-through and abdominal/pelvic CT with contrast in 2016 were nondiagnostic    -CT of the abdomen and pelvis with contrast 05/29/2019 was nondiagnostic    - SIBO positive lactulose and methane breath test    -Treated with rifaximin 500 mg 3 times daily x14 days H. pylori negative gastric ulcers on EGD 05/08/2019    - denied the use of concurrent NSAID Constipation    -TSH and calcium obtained through PCP    -Treated by Dr. Augustin Coupe with fiber Gummies Pancolonic diverticulosis Cholecystectomy 2008  Pulmonary lung nodules seen on CT in 2009    -No active pulmonary disease and stable right nodules on chest CT 2016 Family history of colon cancer (paternal grandmother) High quality colonoscopy 06/15/15 Food sensitivity testing through [facility] (patient initiated) including garlic, ginger  Clinically responding to Xifaxan for SIBO. I do not recommend follow-up breath testing. Will monitor response to therapy based on her symptoms. Ultimate control may depending on controlling her  constipation.  As the patient has not been using any NSAIDs and her gastric biopsies were negative for H. pylori I will have a low threshold to consider repeat upper endoscopy to document healing of her gastric ulcers if her symptoms persist.  PLAN: Complete the two week course of Xifaxan Continue daily probiotic Work to control constipation: try "Move It Potion" Consider repeat EGD if symptoms do not continue to improve Return in 4-6 weeks, or earlier as needed  Please see the "Patient Instructions" section for addition details about the plan.  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 The interval history is obtained through the patient, review of her electronic health record, and review of her prior records from her last gastroenterologist.  She had a cholecystectomy 2008 for chronic cholecystitis and cholelithiasis 07/14/2008, myomectomy for fibroids, ex lap for adhesions, endometrial ablation.  She has a longstanding history of food allergies. Going through fertility treatment.   EGD 05/08/19 showed a normal esophagus although biopsies suggested reflux esophagitis, 2 nonbleeding cratered gastric ulcers the largest measuring 4 mm, and multiple gastric erosions.  Biopsies for H. pylori were negative.  A small hiatal hernia was present.  Mild peptic duodenitis in the bulb was also seen.  Given these results she was instructed to avoid all NSAIDs.  She was treated with pantoprazole 40 mg twice daily for 8 weeks.  CT of the abdomen and colon with contrast 05/29/2019: No acute intra-abdominal or pelvic pathology. Diffuse colonic diverticulosis. No bowel obstruction or active inflammation. Normal appendix. A 15 mm right ovarian  dominant follicle or cyst.  SIBO testing revealed a higher than expected baseline hydrogen however the overall increase in hydrogen is consistent  with a diagnosis of bacterial overgrowth.  There is no concurrent increase in methane.  I recommended a trial of rifaximin 500 mg 3 times daily x14 days.  If her insurance company does not approve will substitute with Septra DS twice daily for 14 days.  She is 11 days into Xifaxan. Her post-prandial bloating has resolved. Abdominal pain is better although still present.  Although she still has constipation, it is not nearly as bad as it has been in the past.   She asks questions about need for recurrent antibiotics after searching SIBO on the internet.   She denies the use of any NSAIDs. She uses Tylenol as needed and takes ASA 81 mg daily.   Prior to our consultation she had increased fiber intake with fiber gummies and dietary fiber. Using coconut milk.  Reduced sugars and carbs. Reduced dairy. Taking [place] probiotics twice daily.  No improvement with a candida cleanse. She has tried multiple detoxes without a change.    Past Medical History:  Diagnosis Date  . Allergy   . Back pain   . Dyspnea   . Fibroid   . Headache(784.0)   . Hypertension during pregnancy   . Infertility, female   . Leg edema   . Osteoarthritis   . Postpartum depression   . Sjogren's syndrome ([place]) 10/28/2015  . Vitamin B 12 deficiency   . Vitamin D deficiency     Past Surgical History:  Procedure Laterality Date  . ABDOMINAL ADHESION SURGERY    . CESAREAN SECTION N/A 03/18/2014   Procedure: CESAREAN SECTION;  Surgeon: Delice Lesch, MD;  Location: [facility];  Service: Obstetrics;  Laterality: N/A;  spinal/epidural  . CHOLECYSTECTOMY    . ENDOMETRIAL ABLATION    . LYMPHADENECTOMY     Removed from the neck at age 61  . MYOMECTOMY    . MYOMECTOMY  05/23/2012   Procedure: MYOMECTOMY;  Surgeon: Governor Specking, MD;  Location: [facility];  Service: Gynecology;  Laterality: N/A;    Current Outpatient Medications  Medication Sig Dispense Refill  . aspirin EC 81 MG tablet Take 1 tablet (81 mg total) by mouth  daily.    . B Complex-C-Folic Acid (B-COMPLEX BALANCED PO) Take by mouth.    . calcium carbonate 200 MG capsule Take 250 mg by mouth 2 (two) times daily with a meal.    . Cholecalciferol (VITAMIN D) 125 MCG (5000 UT) CAPS Take 1 capsule by mouth daily.    Marland Kitchen dexlansoprazole (DEXILANT) 60 MG capsule Take 1 capsule (60 mg total) by mouth daily. 60 capsule 0  . estradiol (ESTRACE) 2 MG tablet Take 2 mg by mouth daily.    Marland Kitchen letrozole (FEMARA) 2.5 MG tablet Take 5 mg by mouth daily.    Marland Kitchen MAGNESIUM PO Take 1 tablet by mouth daily.    . metFORMIN (GLUCOPHAGE-XR) 500 MG 24 hr tablet Take 1 tablet (500 mg total) by mouth daily with breakfast. 90 tablet 0  . Nutritional Supplements (DHEA PO) Take 1 tablet by mouth daily.    . Omega-3 Fatty Acids (FISH OIL) 1000 MG CAPS Take by mouth.    . pantoprazole (PROTONIX) 40 MG tablet TAKE 1 TABLET BY MOUTH TWICE A DAY 60 tablet 1  . Prenatal Vit-Fe Fumarate-FA (PRENATAL VITAMIN PO) Take 1 tablet by mouth daily.    . Probiotic Product (PROBIOTIC-10 PO)  Take by mouth.    . progesterone (PROMETRIUM) 200 MG capsule Take 200 mg by mouth 2 (two) times daily.    Marland Kitchen pyridOXINE (VITAMIN B-6) 50 MG tablet Take 50 mg by mouth daily.    . rifaximin (XIFAXAN) 550 MG TABS tablet Take 1 tablet (550 mg total) by mouth 3 (three) times daily for 14 days. 42 tablet 0  . SELENIUM PO Take 1 tablet by mouth daily.    . Vitamin D, Ergocalciferol, (DRISDOL) 1.25 MG (50000 UT) CAPS capsule Take 1 capsule (50,000 Units total) by mouth every 7 (seven) days. 4 capsule 0  . VITAMIN E PO Take 1 tablet by mouth daily.    Marland Kitchen zinc gluconate 50 MG tablet Take 50 mg by mouth daily.     Current Facility-Administered Medications  Medication Dose Route Frequency Provider Last Rate Last Dose  . cyanocobalamin ((VITAMIN B-12)) injection 1,000 mcg  1,000 mcg Intramuscular Q30 days Carollee Herter, Kendrick Fries R, DO   1,000 mcg at 06/05/19 1010    Allergies as of 06/16/2019 - Review Complete 06/03/2019   Allergen Reaction Noted  . Eggs or egg-derived products  09/15/2012  . Metformin and related  02/09/2019  . Sulfonamide derivatives Hives   . Ganoderma lucidum  05/09/2018  . Maitake  05/09/2018  . Other  09/15/2012  . Penicillins    . Shellfish allergy Nausea And Vomiting 05/23/2012  . Fish-derived products Rash 12/19/2015    Family History  Problem Relation Age of Onset  . Hypertension Paternal Grandfather   . Diabetes Paternal Grandfather   . Diabetes Father   . Hypotension Father   . Colon cancer Paternal Grandmother   . Diabetes Paternal Grandmother   . Prostate cancer Maternal Grandfather   . Hyperlipidemia Mother   . Obesity Mother   . Breast cancer Maternal Aunt   . Arthritis Paternal Aunt   . Hyperlipidemia Maternal Aunt        x's 2    Social History   Socioeconomic History  . Marital status: Significant Other    Spouse name: Sharol Roussel  . Number of children: Not on file  . Years of education: Not on file  . Highest education level: Not on file  Occupational History  . Occupation: Manufacturing engineer: [employer]  Social Needs  . Financial resource strain: Not on file  . Food insecurity    Worry: Not on file    Inability: Not on file  . Transportation needs    Medical: Not on file    Non-medical: Not on file  Tobacco Use  . Smoking status: Never Smoker  . Smokeless tobacco: Never Used  Substance and Sexual Activity  . Alcohol use: Yes    Alcohol/week: 0.0 standard drinks    Comment: socially  . Drug use: No  . Sexual activity: Yes    Partners: Male    Birth control/protection: None  Lifestyle  . Physical activity    Days per week: Not on file    Minutes per session: Not on file  . Stress: Not on file  Relationships  . Social Herbalist on phone: Not on file    Gets together: Not on file    Attends religious service: Not on file    Active member of club or organization: Not on file    Attends meetings of clubs or  organizations: Not on file    Relationship status: Not on file  . Intimate partner violence  Fear of current or ex partner: Not on file    Emotionally abused: Not on file    Physically abused: Not on file    Forced sexual activity: Not on file  Other Topics Concern  . Not on file  Social History Narrative   Aeronautical engineer 3 days a week     Physical Exam: Complete physical exam not performed due to the limits inherent in a telehealth encounter.  General: Awake, alert, and oriented, and well communicative. In no acute distress.  HEENT: EOMI, non-icteric sclera, NCAT, MMM  Neck: Normal movement of head and neck  Pulm: No labored breathing, speaking in full sentences without conversational dyspnea  Derm: No apparent lesions or bruising in visible field  MS: Moves all visible extremities without noticeable abnormality  Psych: Pleasant, cooperative, normal speech, normal affect and normal insight Neuro: Alert and appropriate   Torunn Chancellor L. Tarri Glenn, MD, MPH [facility] 06/16/2019, 8:11 AM

## 2019-06-16 NOTE — Patient Instructions (Signed)
I am glad that you are starting to feel better.  Complete your two week course of Xifaxan.  Continue to take your daily probiotic.  I recommend that we work to get your constipation under control. I am glad that you are using fiber gummies and increasing the fiber in your diet. However, I think we should do more. I recommend trying the  "Move It Potion": Mix equal parts of applesauce, bran, and prune juice. Store in a sealed container in the [place]. Use 2 tablespoons nightly. Increase as needed. Sit on the potty after having your morning cup of coffee to try to have a bowel movement.  We can consider prescription medications if this is not working.  Let's plan to follow-up in 4-6 weeks, or earlier as needed. Call with any questions or concerns before that time.

## 2019-06-17 DIAGNOSIS — Z1151 Encounter for screening for human papillomavirus (HPV): Secondary | ICD-10-CM | POA: Diagnosis not present

## 2019-06-17 DIAGNOSIS — Z6841 Body Mass Index (BMI) 40.0 and over, adult: Secondary | ICD-10-CM | POA: Diagnosis not present

## 2019-06-17 DIAGNOSIS — Z01419 Encounter for gynecological examination (general) (routine) without abnormal findings: Secondary | ICD-10-CM | POA: Diagnosis not present

## 2019-06-17 DIAGNOSIS — Z1231 Encounter for screening mammogram for malignant neoplasm of breast: Secondary | ICD-10-CM | POA: Diagnosis not present

## 2019-06-18 ENCOUNTER — Ambulatory Visit (INDEPENDENT_AMBULATORY_CARE_PROVIDER_SITE_OTHER): Payer: BC Managed Care – PPO | Admitting: Physician Assistant

## 2019-06-18 ENCOUNTER — Encounter (INDEPENDENT_AMBULATORY_CARE_PROVIDER_SITE_OTHER): Payer: Self-pay | Admitting: Physician Assistant

## 2019-06-18 ENCOUNTER — Other Ambulatory Visit: Payer: Self-pay

## 2019-06-18 VITALS — BP 102/70 | HR 86 | Temp 98.6°F | Ht 70.0 in | Wt 286.0 lb

## 2019-06-18 DIAGNOSIS — Z9189 Other specified personal risk factors, not elsewhere classified: Secondary | ICD-10-CM | POA: Diagnosis not present

## 2019-06-18 DIAGNOSIS — E8881 Metabolic syndrome: Secondary | ICD-10-CM | POA: Diagnosis not present

## 2019-06-18 DIAGNOSIS — Z6841 Body Mass Index (BMI) 40.0 and over, adult: Secondary | ICD-10-CM

## 2019-06-18 DIAGNOSIS — E559 Vitamin D deficiency, unspecified: Secondary | ICD-10-CM

## 2019-06-18 MED ORDER — VITAMIN D (ERGOCALCIFEROL) 1.25 MG (50000 UNIT) PO CAPS: 50000.0000 [IU] | ORAL_CAPSULE | ORAL | 0 refills | Status: DC

## 2019-06-21 NOTE — Progress Notes (Signed)
Office: [phone]  /  Fax: [phone]   HPI:   Chief Complaint: OBESITY Stacey Huerta is here to discuss her progress with her obesity treatment plan. She is on the keep a food journal with 1200 calories and 90 grams of protein daily and is following her eating plan approximately 95 % of the time. She states she is doing cardio for 25 minutes 2 times per week. Lekisha reports following her plan closely. She just started exercising again. She denies excessive hunger.  Her weight is 286 lb (129.7 kg) today and has had a weight loss of 3 pounds over a period of 4 weeks since her last visit. She has lost 4 lbs since starting treatment with [place].  Vitamin D Deficiency Stacey Huerta has a diagnosis of vitamin D deficiency. She is currently taking prescription Vit D and denies nausea, vomiting or muscle weakness.  At risk for osteopenia and osteoporosis Stacey Huerta is at higher risk of osteopenia and osteoporosis due to vitamin D deficiency.   Insulin Resistance Stacey Huerta has a diagnosis of insulin resistance based on her elevated fasting insulin level >5. Although Stacey Huerta's blood glucose readings are still under good control, insulin resistance puts her at greater risk of metabolic syndrome and diabetes. She is not on any medications currently and and denies polyphagia. She continues to work on diet and exercise to decrease risk of diabetes.  ASSESSMENT AND PLAN:  Vitamin D deficiency - Plan: Vitamin D, Ergocalciferol, (DRISDOL) 1.25 MG (50000 UT) CAPS capsule  Insulin resistance  At risk for osteoporosis  Class 3 severe obesity with serious comorbidity and body mass index (BMI) of 40.0 to 44.9 in adult, unspecified obesity type ([place])  PLAN:  Vitamin D Deficiency Stacey Huerta was informed that low vitamin D levels contributes to fatigue and are associated with obesity, breast, and colon cancer. Stacey Huerta agrees to continue taking prescription Vit D 50,000 IU every week #4 and we will refill for 1 month. She  will follow up for routine testing of vitamin D, at least 2-3 times per year. She was informed of the risk of over-replacement of vitamin D and agrees to not increase her dose unless she discusses this with [place] first. Stacey Huerta agrees to follow up with our clinic in 2 weeks.  At risk for osteopenia and osteoporosis Stacey Huerta was given extended (15 minutes) osteoporosis prevention counseling today. Stacey Huerta is at risk for osteopenia and osteoporsis due to her vitamin D deficiency. She was encouraged to take her vitamin D and follow her higher calcium diet and increase strengthening exercise to help strengthen her bones and decrease her risk of osteopenia and osteoporosis.  Insulin Resistance Stacey Huerta will continue to work on weight loss, exercise, and decreasing simple carbohydrates in her diet to help decrease the risk of diabetes. We dicussed metformin including benefits and risks. She was informed that eating too many simple carbohydrates or too many calories at one sitting increases the likelihood of GI side effects. Stacey Huerta agrees to follow up with our clinic in 2 weeks as directed to monitor her progress.  Obesity Stacey Huerta is currently in the action stage of change. As such, her goal is to continue with weight loss efforts She has agreed to keep a food journal with 1200-1300 calories and 85 grams of protein daily Stacey Huerta has been instructed to work up to a goal of 150 minutes of combined cardio and strengthening exercise per week for weight loss and overall health benefits. We discussed the following Behavioral Modification Strategies today: work on meal planning and  easy cooking plans and keeping healthy foods in the home   Stacey Huerta has agreed to follow up with our clinic in 2 weeks. She was informed of the importance of frequent follow up visits to maximize her success with intensive lifestyle modifications for her multiple health conditions.  ALLERGIES: Allergies  Allergen Reactions  . Eggs Or  Egg-Derived Products   . Metformin And Related     Possible lactic acidosis  . Sulfonamide Derivatives Hives    Also high fever  . Ganoderma Lucidum   . [place]   . Other     Mushrooms and [place] Nuts  . Penicillins     Childhood reaction  . Shellfish Allergy Nausea And Vomiting  . Fish-Derived Products Rash    MEDICATIONS: Current Outpatient Medications on File Prior to Visit  Medication Sig Dispense Refill  . aspirin EC 81 MG tablet Take 1 tablet (81 mg total) by mouth daily.    . B Complex-C-Folic Acid (B-COMPLEX BALANCED PO) Take by mouth.    . calcium carbonate 200 MG capsule Take 250 mg by mouth 2 (two) times daily with a meal.    . Cholecalciferol (VITAMIN D) 125 MCG (5000 UT) CAPS Take 1 capsule by mouth daily.    Marland Kitchen dexlansoprazole (DEXILANT) 60 MG capsule Take 1 capsule (60 mg total) by mouth daily. 60 capsule 0  . estradiol (ESTRACE) 2 MG tablet Take 2 mg by mouth daily.    Marland Kitchen letrozole (FEMARA) 2.5 MG tablet Take 5 mg by mouth daily.    Marland Kitchen MAGNESIUM PO Take 1 tablet by mouth daily.    . metFORMIN (GLUCOPHAGE-XR) 500 MG 24 hr tablet Take 1 tablet (500 mg total) by mouth daily with breakfast. 90 tablet 0  . Nutritional Supplements (DHEA PO) Take 1 tablet by mouth daily.    . Omega-3 Fatty Acids (FISH OIL) 1000 MG CAPS Take by mouth.    . pantoprazole (PROTONIX) 40 MG tablet TAKE 1 TABLET BY MOUTH TWICE A DAY 60 tablet 1  . Prenatal Vit-Fe Fumarate-FA (PRENATAL VITAMIN PO) Take 1 tablet by mouth daily.    . Probiotic Product (PROBIOTIC-10 PO) Take by mouth.    . progesterone (PROMETRIUM) 200 MG capsule Take 200 mg by mouth 2 (two) times daily.    Marland Kitchen pyridOXINE (VITAMIN B-6) 50 MG tablet Take 50 mg by mouth daily.    . SELENIUM PO Take 1 tablet by mouth daily.    Marland Kitchen VITAMIN E PO Take 1 tablet by mouth daily.    Marland Kitchen zinc gluconate 50 MG tablet Take 50 mg by mouth daily.     Current Facility-Administered Medications on File Prior to Visit  Medication Dose Route Frequency  Provider Last Rate Last Dose  . cyanocobalamin ((VITAMIN B-12)) injection 1,000 mcg  1,000 mcg Intramuscular Q30 days Carollee Herter, Kendrick Fries R, DO   1,000 mcg at 06/05/19 1010    PAST MEDICAL HISTORY: Past Medical History:  Diagnosis Date  . Allergy   . Back pain   . Dyspnea   . Fibroid   . Headache(784.0)   . Hypertension during pregnancy   . Infertility, female   . Leg edema   . Osteoarthritis   . Postpartum depression   . Sjogren's syndrome ([place]) 10/28/2015  . Vitamin B 12 deficiency   . Vitamin D deficiency     PAST SURGICAL HISTORY: Past Surgical History:  Procedure Laterality Date  . ABDOMINAL ADHESION SURGERY    . CESAREAN SECTION N/A 03/18/2014   Procedure: CESAREAN SECTION;  Surgeon: Delice Lesch, MD;  Location: [facility];  Service: Obstetrics;  Laterality: N/A;  spinal/epidural  . CHOLECYSTECTOMY    . ENDOMETRIAL ABLATION    . LYMPHADENECTOMY     Removed from the neck at age 71  . MYOMECTOMY    . MYOMECTOMY  05/23/2012   Procedure: MYOMECTOMY;  Surgeon: Governor Specking, MD;  Location: [facility];  Service: Gynecology;  Laterality: N/A;    SOCIAL HISTORY: Social History   Tobacco Use  . Smoking status: Never Smoker  . Smokeless tobacco: Never Used  Substance Use Topics  . Alcohol use: Yes    Alcohol/week: 0.0 standard drinks    Comment: socially  . Drug use: No    FAMILY HISTORY: Family History  Problem Relation Age of Onset  . Hypertension Paternal Grandfather   . Diabetes Paternal Grandfather   . Diabetes Father   . Hypotension Father   . Colon cancer Paternal Grandmother   . Diabetes Paternal Grandmother   . Prostate cancer Maternal Grandfather   . Hyperlipidemia Mother   . Obesity Mother   . Breast cancer Maternal Aunt   . Arthritis Paternal Aunt   . Hyperlipidemia Maternal Aunt        x's 2    ROS: Review of Systems  Constitutional: Positive for weight loss.  Gastrointestinal: Negative for nausea and vomiting.  Musculoskeletal:        Negative muscle weakness  Endo/Heme/Allergies:       Negative polyphagia    PHYSICAL EXAM: Blood pressure 102/70, pulse 86, temperature 98.6 F (37 C), temperature source Oral, height 5\' 10"  (1.778 m), weight 286 lb (129.7 kg), last menstrual period 05/21/2019, SpO2 98 %. Body mass index is 41.04 kg/m. Physical Exam Vitals signs reviewed.  Constitutional:      Appearance: Normal appearance. She is obese.  Cardiovascular:     Rate and Rhythm: Normal rate.     Pulses: Normal pulses.  Pulmonary:     Effort: Pulmonary effort is normal.     Breath sounds: Normal breath sounds.  Musculoskeletal: Normal range of motion.  Skin:    General: Skin is warm and dry.  Neurological:     Mental Status: She is alert and oriented to person, place, and time.  Psychiatric:        Mood and Affect: Mood normal.        Behavior: Behavior normal.     RECENT LABS AND TESTS: BMET    Component Value Date/Time   NA 139 03/05/2019 1003   K 4.5 03/05/2019 1003   CL 104 03/05/2019 1003   CO2 22 03/05/2019 1003   GLUCOSE 82 03/05/2019 1003   GLUCOSE 104 (H) 05/02/2018 1216   BUN 12 03/05/2019 1003   CREATININE 0.87 03/05/2019 1003   CREATININE 0.98 08/19/2014 1223   CALCIUM 9.1 03/05/2019 1003   GFRNONAA 81 03/05/2019 1003   GFRAA 94 03/05/2019 1003   Lab Results  Component Value Date   HGBA1C 5.0 03/05/2019   HGBA1C 4.9 10/07/2018   HGBA1C 5.0 06/26/2018   HGBA1C 5.2 12/19/2015   HGBA1C 5.2 05/31/2008   Lab Results  Component Value Date   INSULIN 21.8 03/05/2019   INSULIN 10.5 10/07/2018   INSULIN 15.4 06/26/2018   CBC    Component Value Date/Time   WBC 4.4 06/26/2018 1130   WBC 4.1 05/02/2018 1216   RBC 4.38 06/26/2018 1130   RBC 4.23 05/02/2018 1216   HGB 13.6 06/26/2018 1130   HCT 41.1 06/26/2018 1130  PLT 182.0 05/02/2018 1216   MCV 94 06/26/2018 1130   MCH 31.1 06/26/2018 1130   MCH 30.0 08/19/2014 1226   MCH 32.4 03/20/2014 0812   MCHC 33.1 06/26/2018 1130   MCHC  33.6 05/02/2018 1216   RDW 12.0 (L) 06/26/2018 1130   LYMPHSABS 1.3 06/26/2018 1130   MONOABS 0.3 05/02/2018 1216   EOSABS 0.0 06/26/2018 1130   BASOSABS 0.0 06/26/2018 1130   Iron/TIBC/Ferritin/ %Sat No results found for: IRON, TIBC, FERRITIN, IRONPCTSAT Lipid Panel     Component Value Date/Time   CHOL 168 10/07/2018 1149   TRIG 106 10/07/2018 1149   HDL 50 10/07/2018 1149   CHOLHDL 4 12/19/2015 0852   VLDL 20.2 12/19/2015 0852   LDLCALC 97 10/07/2018 1149   Hepatic Function Panel     Component Value Date/Time   PROT 7.1 03/05/2019 1003   ALBUMIN 3.7 (L) 03/05/2019 1003   AST 13 03/05/2019 1003   ALT 11 03/05/2019 1003   ALKPHOS 105 03/05/2019 1003   BILITOT 0.6 03/05/2019 1003   BILIDIR 0.1 12/14/2014 1402   IBILI 0.9 04/30/2008 2117      Component Value Date/Time   TSH 2.420 06/26/2018 1130   TSH 1.59 05/02/2018 1216   TSH 1.34 12/14/2014 1402      OBESITY BEHAVIORAL INTERVENTION VISIT  Today's visit was # 18   Starting weight: 290 lbs Starting date: 06/26/18 Today's weight : 286 lbs  Today's date: 06/18/2019 Total lbs lost to date: 4    ASK: We discussed the diagnosis of obesity with Stacey Huerta today and Stacey Huerta agreed to give [place] permission to discuss obesity behavioral modification therapy today.  ASSESS: Stacey Huerta has the diagnosis of obesity and her BMI today is 41.04 Stacey Huerta is in the action stage of change   ADVISE: Stacey Huerta was educated on the multiple health risks of obesity as well as the benefit of weight loss to improve her health. She was advised of the need for long term treatment and the importance of lifestyle modifications to improve her current health and to decrease her risk of future health problems.  AGREE: Multiple dietary modification options and treatment options were discussed and  Stacey Huerta agreed to follow the recommendations documented in the above note.  ARRANGE: Stacey Huerta was educated on the importance of frequent visits  to treat obesity as outlined per CMS and USPSTF guidelines and agreed to schedule her next follow up appointment today.  Stacey Huerta, am acting as transcriptionist for Abby Potash, PA-C I, Abby Potash, PA-C have reviewed above note and agree with its content

## 2019-06-23 DIAGNOSIS — M3500 Sicca syndrome, unspecified: Secondary | ICD-10-CM | POA: Diagnosis not present

## 2019-06-23 DIAGNOSIS — Z8759 Personal history of other complications of pregnancy, childbirth and the puerperium: Secondary | ICD-10-CM | POA: Diagnosis not present

## 2019-06-24 DIAGNOSIS — R635 Abnormal weight gain: Secondary | ICD-10-CM | POA: Diagnosis not present

## 2019-06-24 DIAGNOSIS — N951 Menopausal and female climacteric states: Secondary | ICD-10-CM | POA: Diagnosis not present

## 2019-06-24 NOTE — Progress Notes (Signed)
noted 

## 2019-06-26 DIAGNOSIS — Z6841 Body Mass Index (BMI) 40.0 and over, adult: Secondary | ICD-10-CM | POA: Diagnosis not present

## 2019-06-26 DIAGNOSIS — Z1331 Encounter for screening for depression: Secondary | ICD-10-CM | POA: Diagnosis not present

## 2019-06-26 DIAGNOSIS — E786 Lipoprotein deficiency: Secondary | ICD-10-CM | POA: Diagnosis not present

## 2019-06-26 DIAGNOSIS — Z1339 Encounter for screening examination for other mental health and behavioral disorders: Secondary | ICD-10-CM | POA: Diagnosis not present

## 2019-06-26 DIAGNOSIS — E7800 Pure hypercholesterolemia, unspecified: Secondary | ICD-10-CM | POA: Diagnosis not present

## 2019-06-28 ENCOUNTER — Other Ambulatory Visit: Payer: Self-pay | Admitting: Gastroenterology

## 2019-07-03 DIAGNOSIS — N96 Recurrent pregnancy loss: Secondary | ICD-10-CM | POA: Diagnosis not present

## 2019-07-03 DIAGNOSIS — N978 Female infertility of other origin: Secondary | ICD-10-CM | POA: Diagnosis not present

## 2019-07-03 DIAGNOSIS — Z1159 Encounter for screening for other viral diseases: Secondary | ICD-10-CM | POA: Diagnosis not present

## 2019-07-03 DIAGNOSIS — E7800 Pure hypercholesterolemia, unspecified: Secondary | ICD-10-CM | POA: Diagnosis not present

## 2019-07-03 DIAGNOSIS — Z6841 Body Mass Index (BMI) 40.0 and over, adult: Secondary | ICD-10-CM | POA: Diagnosis not present

## 2019-07-03 DIAGNOSIS — N882 Stricture and stenosis of cervix uteri: Secondary | ICD-10-CM | POA: Diagnosis not present

## 2019-07-07 ENCOUNTER — Ambulatory Visit (INDEPENDENT_AMBULATORY_CARE_PROVIDER_SITE_OTHER): Payer: BC Managed Care – PPO | Admitting: Physician Assistant

## 2019-07-07 ENCOUNTER — Other Ambulatory Visit: Payer: Self-pay

## 2019-07-07 VITALS — BP 103/69 | HR 92 | Temp 98.5°F | Ht 70.0 in | Wt 290.0 lb

## 2019-07-07 DIAGNOSIS — E8881 Metabolic syndrome: Secondary | ICD-10-CM

## 2019-07-07 DIAGNOSIS — E559 Vitamin D deficiency, unspecified: Secondary | ICD-10-CM | POA: Diagnosis not present

## 2019-07-07 DIAGNOSIS — Z9189 Other specified personal risk factors, not elsewhere classified: Secondary | ICD-10-CM | POA: Diagnosis not present

## 2019-07-07 DIAGNOSIS — Z6841 Body Mass Index (BMI) 40.0 and over, adult: Secondary | ICD-10-CM

## 2019-07-07 MED ORDER — VITAMIN D (ERGOCALCIFEROL) 1.25 MG (50000 UNIT) PO CAPS: 50000.0000 [IU] | ORAL_CAPSULE | ORAL | 0 refills | Status: DC

## 2019-07-09 NOTE — Progress Notes (Signed)
Office: [phone]  /  Fax: [phone]   HPI:   Chief Complaint: OBESITY Stacey Huerta is here to discuss her progress with her obesity treatment plan. She is on the keep a food journal with 1200 to 1300 calories and 85 grams of protein daily plan and she is following her eating plan approximately 95 % of the time. She states she is doing orange theory 45 minutes 4 times per week. Jenalee reports that she started working out again at [employer] and she also went to see [facility], where she was told her leptin levels were elevated. Her weight is 290 lb (131.5 kg) today and she has had a weight gain of 4 pounds over a period of 3 weeks since her last visit. She has lost 0 lbs since starting treatment with [place].  Vitamin D deficiency Stacey Huerta has a diagnosis of vitamin D deficiency. She is currently taking vit D and denies nausea, vomiting or muscle weakness.  At risk for osteopenia and osteoporosis [name] is at higher risk of osteopenia and osteoporosis due to vitamin D deficiency.   Insulin Resistance [name] has a diagnosis of insulin resistance based on her elevated fasting insulin level >5. Although Stacey Huerta's blood glucose readings are still under good control, insulin resistance puts her at greater risk of metabolic syndrome and diabetes. She is taking metformin currently and continues to work on diet and exercise to decrease risk of diabetes. [name] denies polyphagia.  ASSESSMENT AND PLAN:  Vitamin D deficiency - Plan: Vitamin D, Ergocalciferol, (DRISDOL) 1.25 MG (50000 UT) CAPS capsule  Insulin resistance  At risk for osteoporosis  Class 3 severe obesity with serious comorbidity and body mass index (BMI) of 40.0 to 44.9 in adult, unspecified obesity type ([place])  PLAN:  Vitamin D Deficiency Stacey Huerta was informed that low vitamin D levels contributes to fatigue and are associated with obesity, breast, and colon cancer. Stacey Huerta agrees to continue to take prescription Vit D @50 ,000  IU every week #4 with no refills and she will follow up for routine testing of vitamin D, at least 2-3 times per year. She was informed of the risk of over-replacement of vitamin D and agrees to not increase her dose unless she discusses this with [place] first. Stacey Huerta agrees to follow up with our clinic in 2 weeks.  At risk for osteopenia and osteoporosis Stacey Huerta was given extended  (15 minutes) osteoporosis prevention counseling today. Stacey Huerta is at risk for osteopenia and osteoporosis due to her vitamin D deficiency. She was encouraged to take her vitamin D and follow her higher calcium diet and increase strengthening exercise to help strengthen her bones and decrease her risk of osteopenia and osteoporosis.  Insulin Resistance Stacey Huerta will continue to work on weight loss, exercise, and decreasing simple carbohydrates in her diet to help decrease the risk of diabetes. We dicussed metformin including benefits and risks. She was informed that eating too many simple carbohydrates or too many calories at one sitting increases the likelihood of GI side effects. Stacey Huerta will continue metformin for now and prescription was not written today. Stacey Huerta agreed to follow up with [place] as directed to monitor her progress.  Obesity Stacey Huerta is currently in the action stage of change. As such, her goal is to continue with weight loss efforts She has agreed to keep a food journal with 1400 to 1500 calories and 100 grams of protein daily Stacey Huerta has been instructed to work up to a goal of 150 minutes of combined cardio and strengthening exercise  per week for weight loss and overall health benefits. We discussed the following Behavioral Modification Strategies today: keeping healthy foods in the home and work on meal planning and easy cooking plans   Analyse has agreed to follow up with our clinic in 2 weeks. She was informed of the importance of frequent follow up visits to maximize her success with intensive lifestyle  modifications for her multiple health conditions.  ALLERGIES: Allergies  Allergen Reactions   Eggs Or Egg-Derived Products    Metformin And Related     Possible lactic acidosis   Sulfonamide Derivatives Hives    Also high fever   Ganoderma Lucidum    Maitake    Other     Mushrooms and [place] Nuts   Penicillins     Childhood reaction   Shellfish Allergy Nausea And Vomiting   Fish-Derived Products Rash    MEDICATIONS: Current Outpatient Medications on File Prior to Visit  Medication Sig Dispense Refill   liothyronine (CYTOMEL) 5 MCG tablet Take 10 mcg by mouth daily.     aspirin EC 81 MG tablet Take 1 tablet (81 mg total) by mouth daily.     B Complex-C-Folic Acid (B-COMPLEX BALANCED PO) Take by mouth.     calcium carbonate 200 MG capsule Take 250 mg by mouth 2 (two) times daily with a meal.     Cholecalciferol (VITAMIN D) 125 MCG (5000 UT) CAPS Take 1 capsule by mouth daily.     dexlansoprazole (DEXILANT) 60 MG capsule Take 1 capsule (60 mg total) by mouth every morning. 90 capsule 3   estradiol (ESTRACE) 2 MG tablet Take 2 mg by mouth daily.     letrozole (FEMARA) 2.5 MG tablet Take 5 mg by mouth daily.     MAGNESIUM PO Take 1 tablet by mouth daily.     metFORMIN (GLUCOPHAGE-XR) 500 MG 24 hr tablet Take 1 tablet (500 mg total) by mouth daily with breakfast. 90 tablet 0   Nutritional Supplements (DHEA PO) Take 1 tablet by mouth daily.     Omega-3 Fatty Acids (FISH OIL) 1000 MG CAPS Take by mouth.     pantoprazole (PROTONIX) 40 MG tablet TAKE 1 TABLET BY MOUTH TWICE A DAY 60 tablet 1   Prenatal Vit-Fe Fumarate-FA (PRENATAL VITAMIN PO) Take 1 tablet by mouth daily.     Probiotic Product (PROBIOTIC-10 PO) Take by mouth.     progesterone (PROMETRIUM) 200 MG capsule Take 200 mg by mouth 2 (two) times daily.     pyridOXINE (VITAMIN B-6) 50 MG tablet Take 50 mg by mouth daily.     SELENIUM PO Take 1 tablet by mouth daily.     VITAMIN E PO Take 1 tablet by  mouth daily.     zinc gluconate 50 MG tablet Take 50 mg by mouth daily.     Current Facility-Administered Medications on File Prior to Visit  Medication Dose Route Frequency Provider Last Rate Last Dose   cyanocobalamin ((VITAMIN B-12)) injection 1,000 mcg  1,000 mcg Intramuscular Q30 days Carollee Herter, Yvonne R, DO   1,000 mcg at 06/05/19 1010    PAST MEDICAL HISTORY: Past Medical History:  Diagnosis Date   Allergy    Back pain    Dyspnea    Fibroid    Headache(784.0)    Hypertension during pregnancy    Infertility, female    Leg edema    Osteoarthritis    Postpartum depression    Sjogren's syndrome ([place]) 10/28/2015   Vitamin B 12 deficiency  Vitamin D deficiency     PAST SURGICAL HISTORY: Past Surgical History:  Procedure Laterality Date   ABDOMINAL ADHESION SURGERY     CESAREAN SECTION N/A 03/18/2014   Procedure: CESAREAN SECTION;  Surgeon: Delice Lesch, MD;  Location: [facility];  Service: Obstetrics;  Laterality: N/A;  spinal/epidural   CHOLECYSTECTOMY     ENDOMETRIAL ABLATION     LYMPHADENECTOMY     Removed from the neck at age 41   MYOMECTOMY     MYOMECTOMY  05/23/2012   Procedure: MYOMECTOMY;  Surgeon: Governor Specking, MD;  Location: [facility];  Service: Gynecology;  Laterality: N/A;    SOCIAL HISTORY: Social History   Tobacco Use   Smoking status: Never Smoker   Smokeless tobacco: Never Used  Substance Use Topics   Alcohol use: Yes    Alcohol/week: 0.0 standard drinks    Comment: socially   Drug use: No    FAMILY HISTORY: Family History  Problem Relation Age of Onset   Hypertension Paternal Grandfather    Diabetes Paternal Grandfather    Diabetes Father    Hypotension Father    Colon cancer Paternal Grandmother    Diabetes Paternal Grandmother    Prostate cancer Maternal Grandfather    Hyperlipidemia Mother    Obesity Mother    Breast cancer Maternal Aunt    Arthritis Paternal Aunt    Hyperlipidemia Maternal  Aunt        x's 2    ROS: Review of Systems  Constitutional: Negative for weight loss.  Gastrointestinal: Negative for nausea and vomiting.  Musculoskeletal:       Negative for muscle weakness  Endo/Heme/Allergies:       Negative for polyphagia    PHYSICAL EXAM: Blood pressure 103/69, pulse 92, temperature 98.5 F (36.9 C), temperature source Oral, height 5\' 10"  (1.778 m), weight 290 lb (131.5 kg), SpO2 99 %. Body mass index is 41.61 kg/m. Physical Exam Vitals signs reviewed.  Constitutional:      Appearance: Normal appearance. She is well-developed. She is obese.  Cardiovascular:     Rate and Rhythm: Normal rate.  Pulmonary:     Effort: Pulmonary effort is normal.  Musculoskeletal: Normal range of motion.  Skin:    General: Skin is warm and dry.  Neurological:     Mental Status: She is alert and oriented to person, place, and time.  Psychiatric:        Mood and Affect: Mood normal.        Behavior: Behavior normal.     RECENT LABS AND TESTS: BMET    Component Value Date/Time   NA 139 03/05/2019 1003   K 4.5 03/05/2019 1003   CL 104 03/05/2019 1003   CO2 22 03/05/2019 1003   GLUCOSE 82 03/05/2019 1003   GLUCOSE 104 (H) 05/02/2018 1216   BUN 12 03/05/2019 1003   CREATININE 0.87 03/05/2019 1003   CREATININE 0.98 08/19/2014 1223   CALCIUM 9.1 03/05/2019 1003   GFRNONAA 81 03/05/2019 1003   GFRAA 94 03/05/2019 1003   Lab Results  Component Value Date   HGBA1C 5.0 03/05/2019   HGBA1C 4.9 10/07/2018   HGBA1C 5.0 06/26/2018   HGBA1C 5.2 12/19/2015   HGBA1C 5.2 05/31/2008   Lab Results  Component Value Date   INSULIN 21.8 03/05/2019   INSULIN 10.5 10/07/2018   INSULIN 15.4 06/26/2018   CBC    Component Value Date/Time   WBC 4.4 06/26/2018 1130   WBC 4.1 05/02/2018 1216   RBC 4.38  06/26/2018 1130   RBC 4.23 05/02/2018 1216   HGB 13.6 06/26/2018 1130   HCT 41.1 06/26/2018 1130   PLT 182.0 05/02/2018 1216   MCV 94 06/26/2018 1130   MCH 31.1  06/26/2018 1130   MCH 30.0 08/19/2014 1226   MCH 32.4 03/20/2014 0812   MCHC 33.1 06/26/2018 1130   MCHC 33.6 05/02/2018 1216   RDW 12.0 (L) 06/26/2018 1130   LYMPHSABS 1.3 06/26/2018 1130   MONOABS 0.3 05/02/2018 1216   EOSABS 0.0 06/26/2018 1130   BASOSABS 0.0 06/26/2018 1130   Iron/TIBC/Ferritin/ %Sat No results found for: IRON, TIBC, FERRITIN, IRONPCTSAT Lipid Panel     Component Value Date/Time   CHOL 168 10/07/2018 1149   TRIG 106 10/07/2018 1149   HDL 50 10/07/2018 1149   CHOLHDL 4 12/19/2015 0852   VLDL 20.2 12/19/2015 0852   LDLCALC 97 10/07/2018 1149   Hepatic Function Panel     Component Value Date/Time   PROT 7.1 03/05/2019 1003   ALBUMIN 3.7 (L) 03/05/2019 1003   AST 13 03/05/2019 1003   ALT 11 03/05/2019 1003   ALKPHOS 105 03/05/2019 1003   BILITOT 0.6 03/05/2019 1003   BILIDIR 0.1 12/14/2014 1402   IBILI 0.9 04/30/2008 2117      Component Value Date/Time   TSH 2.420 06/26/2018 1130   TSH 1.59 05/02/2018 1216   TSH 1.34 12/14/2014 1402      OBESITY BEHAVIORAL INTERVENTION VISIT  Today's visit was # 19   Starting weight: 290 lbs Starting date: 06/26/2018 Today's weight : 290 lbs Today's date: 07/07/2019 Total lbs lost to date: 0    07/07/2019  Height 5\' 10"  (1.778 m)  Weight 290 lb (131.5 kg)  BMI (Calculated) 41.61  BLOOD PRESSURE - SYSTOLIC [id number]  BLOOD PRESSURE - DIASTOLIC 69   Body Fat % [id number] %  Total Body Water (lbs) 99.2 lbs    ASK: We discussed the diagnosis of obesity with Christene Ava Pamella Pert today and Kaelene agreed to give [place] permission to discuss obesity behavioral modification therapy today.  ASSESS: Anaijah has the diagnosis of obesity and her BMI today is 41.61 Kiira is in the action stage of change   ADVISE: Reginna was educated on the multiple health risks of obesity as well as the benefit of weight loss to improve her health. She was advised of the need for long term treatment and the importance of lifestyle  modifications to improve her current health and to decrease her risk of future health problems.  AGREE: Multiple dietary modification options and treatment options were discussed and  Cydne agreed to follow the recommendations documented in the above note.  ARRANGE: Jameisha was educated on the importance of frequent visits to treat obesity as outlined per CMS and USPSTF guidelines and agreed to schedule her next follow up appointment today.  Corey Skains, am acting as transcriptionist for Abby Potash, PA-C I, Abby Potash, PA-C have reviewed above note and agree with its content

## 2019-07-10 ENCOUNTER — Ambulatory Visit: Payer: BC Managed Care – PPO

## 2019-07-10 DIAGNOSIS — Z6841 Body Mass Index (BMI) 40.0 and over, adult: Secondary | ICD-10-CM | POA: Diagnosis not present

## 2019-07-10 DIAGNOSIS — E039 Hypothyroidism, unspecified: Secondary | ICD-10-CM | POA: Diagnosis not present

## 2019-07-10 DIAGNOSIS — E786 Lipoprotein deficiency: Secondary | ICD-10-CM | POA: Diagnosis not present

## 2019-07-11 ENCOUNTER — Other Ambulatory Visit (INDEPENDENT_AMBULATORY_CARE_PROVIDER_SITE_OTHER): Payer: Self-pay | Admitting: Physician Assistant

## 2019-07-11 DIAGNOSIS — E559 Vitamin D deficiency, unspecified: Secondary | ICD-10-CM

## 2019-07-12 ENCOUNTER — Encounter (INDEPENDENT_AMBULATORY_CARE_PROVIDER_SITE_OTHER): Payer: Self-pay | Admitting: Physician Assistant

## 2019-07-14 ENCOUNTER — Other Ambulatory Visit (INDEPENDENT_AMBULATORY_CARE_PROVIDER_SITE_OTHER): Payer: Self-pay | Admitting: Physician Assistant

## 2019-07-14 DIAGNOSIS — E8881 Metabolic syndrome: Secondary | ICD-10-CM

## 2019-07-21 ENCOUNTER — Ambulatory Visit (INDEPENDENT_AMBULATORY_CARE_PROVIDER_SITE_OTHER): Payer: BC Managed Care – PPO | Admitting: Physician Assistant

## 2019-07-21 ENCOUNTER — Other Ambulatory Visit: Payer: Self-pay

## 2019-07-21 VITALS — BP 96/69 | HR 99 | Temp 98.6°F | Ht 70.0 in | Wt 284.0 lb

## 2019-07-21 DIAGNOSIS — Z6841 Body Mass Index (BMI) 40.0 and over, adult: Secondary | ICD-10-CM | POA: Diagnosis not present

## 2019-07-21 DIAGNOSIS — E559 Vitamin D deficiency, unspecified: Secondary | ICD-10-CM

## 2019-07-21 NOTE — Progress Notes (Signed)
Office: [phone]  /  Fax: [phone]   HPI:   Chief Complaint: OBESITY Stacey Huerta is here to discuss her progress with her obesity treatment plan. She is on the  keep a food journal with 1400-1500 calories and 100g of protein daily and is following her eating plan approximately 95 % of the time. She states she is exercising by going to [facility] for 60 minutes 4 times per week. Stacey Huerta reports that she followed the plan well except during her mom's birthday celebration. She is eating closer to 1500 calories on days when she works out at [employer].  Her weight is 284 lb (128.8 kg) today and has had a weight loss of 6 pounds over a period of 2 weeks since her last visit. She has lost 6 lbs since starting treatment with [place].  Vitamin D deficiency Stacey Huerta has a diagnosis of vitamin D deficiency. She is currently taking vit D and denies nausea, vomiting or muscle weakness.  ASSESSMENT AND PLAN:  Vitamin D deficiency  Class 3 severe obesity with serious comorbidity and body mass index (BMI) of 40.0 to 44.9 in adult, unspecified obesity type ([place])  PLAN: Vitamin D Deficiency Stacey Huerta was informed that low vitamin D levels contributes to fatigue and are associated with obesity, breast, and colon cancer. She agrees to continue to take prescription Vit D @50 ,000 IU every week and will follow up for routine testing of vitamin D, at least 2-3 times per year. She was informed of the risk of over-replacement of vitamin D and agrees to not increase her dose unless she discusses this with [place] first. Agrees to follow up with our clinic as directed.   Obesity Stacey Huerta is currently in the action stage of change. As such, her goal is to continue with weight loss efforts She has agreed to keep a food journal with 1200-1500 calories and 95g of protein daily.  Stacey Huerta has been instructed to work up to a goal of 150 minutes of combined cardio and strengthening exercise per week for weight loss  and overall health benefits. We discussed the following Behavioral Modification Strategies today: work on meal planning and easy cooking plans and keeping healthy foods in the home.    Stacey Huerta has agreed to follow up with our clinic in 2 weeks. She was informed of the importance of frequent follow up visits to maximize her success with intensive lifestyle modifications for her multiple health conditions.  ALLERGIES: Allergies  Allergen Reactions  . Eggs Or Egg-Derived Products   . Metformin And Related     Possible lactic acidosis  . Sulfonamide Derivatives Hives    Also high fever  . Ganoderma Lucidum   . [place]   . Other     Mushrooms and [place] Nuts  . Penicillins     Childhood reaction  . Shellfish Allergy Nausea And Vomiting  . Fish-Derived Products Rash    MEDICATIONS: Current Outpatient Medications on File Prior to Visit  Medication Sig Dispense Refill  . aspirin EC 81 MG tablet Take 1 tablet (81 mg total) by mouth daily.    . B Complex-C-Folic Acid (B-COMPLEX BALANCED PO) Take by mouth.    . calcium carbonate 200 MG capsule Take 250 mg by mouth 2 (two) times daily with a meal.    . Cholecalciferol (VITAMIN D) 125 MCG (5000 UT) CAPS Take 1 capsule by mouth daily.    Marland Kitchen dexlansoprazole (DEXILANT) 60 MG capsule Take 1 capsule (60 mg total) by mouth every morning. [place]  capsule 3  . estradiol (ESTRACE) 2 MG tablet Take 2 mg by mouth daily.    Marland Kitchen letrozole (FEMARA) 2.5 MG tablet Take 5 mg by mouth daily.    Marland Kitchen liothyronine (CYTOMEL) 5 MCG tablet Take 10 mcg by mouth daily.    Marland Kitchen MAGNESIUM PO Take 1 tablet by mouth daily.    . metFORMIN (GLUCOPHAGE-XR) 500 MG 24 hr tablet Take 1 tablet (500 mg total) by mouth daily with breakfast. 90 tablet 0  . Nutritional Supplements (DHEA PO) Take 1 tablet by mouth daily.    . Omega-3 Fatty Acids (FISH OIL) 1000 MG CAPS Take by mouth.    . pantoprazole (PROTONIX) 40 MG tablet TAKE 1 TABLET BY MOUTH TWICE A DAY 60 tablet 1  . Prenatal Vit-Fe  Fumarate-FA (PRENATAL VITAMIN PO) Take 1 tablet by mouth daily.    . Probiotic Product (PROBIOTIC-10 PO) Take by mouth.    . progesterone (PROMETRIUM) 200 MG capsule Take 200 mg by mouth 2 (two) times daily.    Marland Kitchen pyridOXINE (VITAMIN B-6) 50 MG tablet Take 50 mg by mouth daily.    . SELENIUM PO Take 1 tablet by mouth daily.    . Vitamin D, Ergocalciferol, (DRISDOL) 1.25 MG (50000 UT) CAPS capsule Take 1 capsule (50,000 Units total) by mouth every 7 (seven) days. 4 capsule 0  . VITAMIN E PO Take 1 tablet by mouth daily.    Marland Kitchen zinc gluconate 50 MG tablet Take 50 mg by mouth daily.     Current Facility-Administered Medications on File Prior to Visit  Medication Dose Route Frequency Provider Last Rate Last Dose  . cyanocobalamin ((VITAMIN B-12)) injection 1,000 mcg  1,000 mcg Intramuscular Q30 days Carollee Herter, Kendrick Fries R, DO   1,000 mcg at 06/05/19 1010    PAST MEDICAL HISTORY: Past Medical History:  Diagnosis Date  . Allergy   . Back pain   . Dyspnea   . Fibroid   . Headache(784.0)   . Hypertension during pregnancy   . Infertility, female   . Leg edema   . Osteoarthritis   . Postpartum depression   . Sjogren's syndrome ([place]) 10/28/2015  . Vitamin B 12 deficiency   . Vitamin D deficiency     PAST SURGICAL HISTORY: Past Surgical History:  Procedure Laterality Date  . ABDOMINAL ADHESION SURGERY    . CESAREAN SECTION N/A 03/18/2014   Procedure: CESAREAN SECTION;  Surgeon: Delice Lesch, MD;  Location: [facility];  Service: Obstetrics;  Laterality: N/A;  spinal/epidural  . CHOLECYSTECTOMY    . ENDOMETRIAL ABLATION    . LYMPHADENECTOMY     Removed from the neck at age 67  . MYOMECTOMY    . MYOMECTOMY  05/23/2012   Procedure: MYOMECTOMY;  Surgeon: Governor Specking, MD;  Location: [facility];  Service: Gynecology;  Laterality: N/A;    SOCIAL HISTORY: Social History   Tobacco Use  . Smoking status: Never Smoker  . Smokeless tobacco: Never Used  Substance Use Topics  . Alcohol use: Yes     Alcohol/week: 0.0 standard drinks    Comment: socially  . Drug use: No    FAMILY HISTORY: Family History  Problem Relation Age of Onset  . Hypertension Paternal Grandfather   . Diabetes Paternal Grandfather   . Diabetes Father   . Hypotension Father   . Colon cancer Paternal Grandmother   . Diabetes Paternal Grandmother   . Prostate cancer Maternal Grandfather   . Hyperlipidemia Mother   . Obesity Mother   .  Breast cancer Maternal Aunt   . Arthritis Paternal Aunt   . Hyperlipidemia Maternal Aunt        x's 2    ROS: Review of Systems  Constitutional: Positive for weight loss.  Gastrointestinal: Negative for nausea and vomiting.  Musculoskeletal:       Negative for muscle weakness    PHYSICAL EXAM: Blood pressure 96/69, pulse 99, temperature 98.6 F (37 C), temperature source Oral, height 5\' 10"  (1.778 m), weight 284 lb (128.8 kg), last menstrual period 06/29/2019, SpO2 99 %. Body mass index is 40.75 kg/m. Physical Exam Vitals signs reviewed.  Constitutional:      Appearance: Normal appearance. She is obese.  HENT:     Head: Normocephalic.     Nose: Nose normal.  Neck:     Musculoskeletal: Normal range of motion.  Cardiovascular:     Rate and Rhythm: Normal rate.  Pulmonary:     Effort: Pulmonary effort is normal.  Musculoskeletal: Normal range of motion.  Skin:    General: Skin is warm and dry.  Neurological:     Mental Status: She is alert and oriented to person, place, and time.  Psychiatric:        Mood and Affect: Mood normal.        Behavior: Behavior normal.     RECENT LABS AND TESTS: BMET    Component Value Date/Time   NA 139 03/05/2019 1003   K 4.5 03/05/2019 1003   CL 104 03/05/2019 1003   CO2 22 03/05/2019 1003   GLUCOSE 82 03/05/2019 1003   GLUCOSE 104 (H) 05/02/2018 1216   BUN 12 03/05/2019 1003   CREATININE 0.87 03/05/2019 1003   CREATININE 0.98 08/19/2014 1223   CALCIUM 9.1 03/05/2019 1003   GFRNONAA 81 03/05/2019 1003   GFRAA  94 03/05/2019 1003   Lab Results  Component Value Date   HGBA1C 5.0 03/05/2019   HGBA1C 4.9 10/07/2018   HGBA1C 5.0 06/26/2018   HGBA1C 5.2 12/19/2015   HGBA1C 5.2 05/31/2008   Lab Results  Component Value Date   INSULIN 21.8 03/05/2019   INSULIN 10.5 10/07/2018   INSULIN 15.4 06/26/2018   CBC    Component Value Date/Time   WBC 4.4 06/26/2018 1130   WBC 4.1 05/02/2018 1216   RBC 4.38 06/26/2018 1130   RBC 4.23 05/02/2018 1216   HGB 13.6 06/26/2018 1130   HCT 41.1 06/26/2018 1130   PLT 182.0 05/02/2018 1216   MCV 94 06/26/2018 1130   MCH 31.1 06/26/2018 1130   MCH 30.0 08/19/2014 1226   MCH 32.4 03/20/2014 0812   MCHC 33.1 06/26/2018 1130   MCHC 33.6 05/02/2018 1216   RDW 12.0 (L) 06/26/2018 1130   LYMPHSABS 1.3 06/26/2018 1130   MONOABS 0.3 05/02/2018 1216   EOSABS 0.0 06/26/2018 1130   BASOSABS 0.0 06/26/2018 1130   Iron/TIBC/Ferritin/ %Sat No results found for: IRON, TIBC, FERRITIN, IRONPCTSAT Lipid Panel     Component Value Date/Time   CHOL 168 10/07/2018 1149   TRIG 106 10/07/2018 1149   HDL 50 10/07/2018 1149   CHOLHDL 4 12/19/2015 0852   VLDL 20.2 12/19/2015 0852   LDLCALC 97 10/07/2018 1149   Hepatic Function Panel     Component Value Date/Time   PROT 7.1 03/05/2019 1003   ALBUMIN 3.7 (L) 03/05/2019 1003   AST 13 03/05/2019 1003   ALT 11 03/05/2019 1003   ALKPHOS 105 03/05/2019 1003   BILITOT 0.6 03/05/2019 1003   BILIDIR 0.1 12/14/2014 1402  IBILI 0.9 04/30/2008 2117      Component Value Date/Time   TSH 2.420 06/26/2018 1130   TSH 1.59 05/02/2018 1216   TSH 1.34 12/14/2014 1402     Ref. Range 03/05/2019 10:03  Vitamin D, 25-Hydroxy Latest Ref Range: 30.0 - 100.0 ng/mL 53.5      OBESITY BEHAVIORAL INTERVENTION VISIT  Today's visit was # 20   Starting weight: 290 lbs Starting date: 06/26/18 Today's weight : Weight: 284 lb (128.8 kg)  Today's date: 07/21/2019 Total lbs lost to date: 6 lbs At least 15 minutes were spent on  discussing the following behavioral intervention visit.   ASK: We discussed the diagnosis of obesity with Terence Ava Pamella Pert today and Soraya agreed to give [place] permission to discuss obesity behavioral modification therapy today.  ASSESS: Cerina has the diagnosis of obesity and her BMI today is 40.75 Coralee is in the action stage of change   ADVISE: Navjot was educated on the multiple health risks of obesity as well as the benefit of weight loss to improve her health. She was advised of the need for long term treatment and the importance of lifestyle modifications to improve her current health and to decrease her risk of future health problems.  AGREE: Multiple dietary modification options and treatment options were discussed and  Karolyn agreed to follow the recommendations documented in the above note.  ARRANGE: Brinnley was educated on the importance of frequent visits to treat obesity as outlined per CMS and USPSTF guidelines and agreed to schedule her next follow up appointment today.  Leary Roca, am acting as transcriptionist for Abby Potash, PA-C  I, Abby Potash, PA-C have reviewed above note and agree with its content

## 2019-07-22 ENCOUNTER — Ambulatory Visit (INDEPENDENT_AMBULATORY_CARE_PROVIDER_SITE_OTHER): Payer: BC Managed Care – PPO | Admitting: Gastroenterology

## 2019-07-22 ENCOUNTER — Encounter: Payer: Self-pay | Admitting: Gastroenterology

## 2019-07-22 DIAGNOSIS — R140 Abdominal distension (gaseous): Secondary | ICD-10-CM | POA: Diagnosis not present

## 2019-07-22 DIAGNOSIS — K5900 Constipation, unspecified: Secondary | ICD-10-CM

## 2019-07-22 DIAGNOSIS — R109 Unspecified abdominal pain: Secondary | ICD-10-CM

## 2019-07-22 NOTE — Patient Instructions (Signed)
Our goal right now is to minimize your constipation. I recommend the Move It Diet: Equal parts of applesauce, bran, and prune juice. Store in a sealed container in the [place]. Use 2 tablespoons nightly. Increase as needed. Sit on the potty after having your morning cup of coffee to try to have a bowel movement.   Continue to take a daily probiotic.  Let's plan on another EGD in November to follow-up on gastric ulcers and insure that they have healed.  Please call with any questions or concerns prior to that time.

## 2019-07-22 NOTE — Addendum Note (Signed)
Addended by: Wyline Beady on: 07/22/2019 03:08 PM   Modules accepted: Orders

## 2019-07-22 NOTE — Progress Notes (Signed)
TELEHEALTH VISIT  Referring Provider: Ann Held, * Primary Care Physician:  Carollee Herter, Alferd Apa, DO   Tele-visit due to COVID-19 pandemic Patient requested visit virtually, consented to the virtual encounter via video enabled telemedicine application (Zoom) Contact made at: 07/22/19 11`:40 Patient verified by name and date of birth Location of patient: Home Location provider: [facility] Names of persons participating: Me, patient, Tinnie Gens CMA Time spent on telehealth visit: 22 minutes I discussed the limitations of evaluation and management by telemedicine. The patient expressed understanding and agreed to proceed.  Chief complaint:  Bloating, Constipation   IMPRESSION:  Small intestinal bacterial overgrowth presenting with abdominal pain and bloating    - evaluated by Dr. Augustin Coupe in 2016 and 2019     -High-quality colonoscopy 06/15/2015 showed no source of pain    -Small bowel follow-through and abdominal/pelvic CT with contrast in 2016 were nondiagnostic    -CT of the abdomen and pelvis with contrast 05/29/2019 was nondiagnostic    - SIBO positive lactulose and methane breath test    -Treated with rifaximin 500 mg 3 times daily x14 days H. pylori negative gastric ulcers on EGD 05/08/2019    - denied the use of concurrent NSAID Constipation    -TSH and calcium obtained through PCP    -Treated by Dr. Augustin Coupe with fiber Gummies Pancolonic diverticulosis Cholecystectomy 2008  Pulmonary lung nodules seen on CT in 2009    -No active pulmonary disease and stable right nodules on chest CT 2016 Family history of colon cancer (paternal grandmother) High quality colonoscopy 06/15/15 Food sensitivity testing through [facility] (patient initiated) including garlic, ginger  Clinically responded to Xifaxan for SIBO. I do not recommend follow-up breath testing. Will monitor response to therapy based on her symptoms. Ultimate control may depending on controlling her  constipation.  As the patient has not been using any NSAIDs and her gastric biopsies were negative for H. Pylori will plan upper endoscopy to document healing of her gastric ulcers.   PLAN: Complete the two week course of Xifaxan Continue daily probiotic Work to control constipation: try "Move It Potion" Repeat EGD to assess for gastric ulcer healing (patient requests early November if symptoms are not resolved) Return for follow-up after the EGD  Please see the "Patient Instructions" section for addition details about the plan.  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 The interval history is obtained through the patient and review of her electronic health record.  She had a cholecystectomy 2008 for chronic cholecystitis and cholelithiasis 07/14/2008, myomectomy for fibroids, ex lap for adhesions, endometrial ablation.  She has a longstanding history of food allergies. She continues to go through fertility treatment.   EGD 05/08/19 showed a normal esophagus although biopsies suggested reflux esophagitis, 2 nonbleeding cratered gastric ulcers the largest measuring 4 mm, and multiple gastric erosions.  Biopsies for H. pylori were negative.  A small hiatal hernia was present.  Mild peptic duodenitis in the bulb was also seen.  Given these results she was instructed to avoid all NSAIDs.  She was treated with pantoprazole 40 mg twice daily for 8 weeks.  CT of the abdomen and colon with contrast 05/29/2019: No acute intra-abdominal or pelvic pathology. Diffuse colonic diverticulosis. No bowel obstruction or active inflammation. Normal appendix. A 15 mm right ovarian dominant follicle or cyst.  SIBO testing revealed a higher than  expected baseline hydrogen however the overall increase in hydrogen is consistent with a diagnosis of bacterial overgrowth.  There is no  concurrent increase in methane.  She completed 14 days of rifaximin 500 mg 3 times daily x14 days.  Asymptomatic while on Xifaxan. Gas and bloating has improved even after completing the 14 days of treatment.Abdominal pain is better although still present.  Although she still has constipation, it is not nearly as bad as it has been in the past.  She is trying to eat more fiber. Notes constipation cycles with her menstrual cycle.Her post-prandial bloating has resolved. Abdominal pain is better although still present.  Although she still has constipation, it is not nearly as bad as it has been in the past.   She denies the use of any NSAIDs. She continues to focus on enough fiber in the diet with fiber gummies and dietary fiber. Using coconut milk.  Minimizes sugars, carbs, and dairy. Taking [place] probiotics twice daily.  No improvement with a candida cleanse. She has tried multiple detoxes without a change.   Past Medical History:  Diagnosis Date  . Allergy   . Back pain   . Dyspnea   . Fibroid   . Headache(784.0)   . Hypertension during pregnancy   . Infertility, female   . Leg edema   . Osteoarthritis   . Postpartum depression   . Sjogren's syndrome ([place]) 10/28/2015  . Vitamin B 12 deficiency   . Vitamin D deficiency     Past Surgical History:  Procedure Laterality Date  . ABDOMINAL ADHESION SURGERY    . CESAREAN SECTION N/A 03/18/2014   Procedure: CESAREAN SECTION;  Surgeon: Delice Lesch, MD;  Location: [facility];  Service: Obstetrics;  Laterality: N/A;  spinal/epidural  . CHOLECYSTECTOMY    . ENDOMETRIAL ABLATION    . LYMPHADENECTOMY     Removed from the neck at age 30  . MYOMECTOMY    . MYOMECTOMY  05/23/2012   Procedure: MYOMECTOMY;  Surgeon: Governor Specking, MD;  Location: [facility];  Service: Gynecology;  Laterality: N/A;    Current Outpatient Medications  Medication Sig Dispense Refill  . aspirin EC 81 MG tablet Take 1 tablet (81 mg total) by mouth daily.    . B  Complex-C-Folic Acid (B-COMPLEX BALANCED PO) Take by mouth.    . calcium carbonate 200 MG capsule Take 250 mg by mouth 2 (two) times daily with a meal.    . Cholecalciferol (VITAMIN D) 125 MCG (5000 UT) CAPS Take 1 capsule by mouth daily.    Marland Kitchen dexlansoprazole (DEXILANT) 60 MG capsule Take 1 capsule (60 mg total) by mouth every morning. 90 capsule 3  . estradiol (ESTRACE) 2 MG tablet Take 2 mg by mouth daily.    Marland Kitchen letrozole (FEMARA) 2.5 MG tablet Take 5 mg by mouth daily.    Marland Kitchen liothyronine (CYTOMEL) 5 MCG tablet Take 10 mcg by mouth daily.    Marland Kitchen MAGNESIUM PO Take 1 tablet by mouth daily.    . metFORMIN (GLUCOPHAGE-XR) 500 MG 24 hr tablet Take 1 tablet (500 mg total) by mouth daily with breakfast. 90 tablet 0  . Nutritional Supplements (DHEA PO) Take 1 tablet by mouth daily.    . Omega-3 Fatty Acids (FISH OIL) 1000 MG CAPS Take by mouth.    . pantoprazole (PROTONIX) 40 MG tablet TAKE 1 TABLET BY MOUTH TWICE A DAY 60 tablet 1  . Prenatal Vit-Fe Fumarate-FA (PRENATAL VITAMIN PO) Take 1 tablet by mouth daily.    Marland Kitchen  Probiotic Product (PROBIOTIC-10 PO) Take by mouth.    . progesterone (PROMETRIUM) 200 MG capsule Take 200 mg by mouth 2 (two) times daily.    Marland Kitchen pyridOXINE (VITAMIN B-6) 50 MG tablet Take 50 mg by mouth daily.    . SELENIUM PO Take 1 tablet by mouth daily.    . Vitamin D, Ergocalciferol, (DRISDOL) 1.25 MG (50000 UT) CAPS capsule Take 1 capsule (50,000 Units total) by mouth every 7 (seven) days. 4 capsule 0  . VITAMIN E PO Take 1 tablet by mouth daily.    Marland Kitchen zinc gluconate 50 MG tablet Take 50 mg by mouth daily.     Current Facility-Administered Medications  Medication Dose Route Frequency Provider Last Rate Last Dose  . cyanocobalamin ((VITAMIN B-12)) injection 1,000 mcg  1,000 mcg Intramuscular Q30 days Carollee Herter, Kendrick Fries R, DO   1,000 mcg at 06/05/19 1010    Allergies as of 07/22/2019 - Review Complete 07/21/2019  Allergen Reaction Noted  . Eggs or egg-derived products  09/15/2012   . Metformin and related  02/09/2019  . Sulfonamide derivatives Hives   . Ganoderma lucidum  05/09/2018  . [place]  05/09/2018  . Other  09/15/2012  . Penicillins    . Shellfish allergy Nausea And Vomiting 05/23/2012  . Fish-derived products Rash 12/19/2015    Family History  Problem Relation Age of Onset  . Hypertension Paternal Grandfather   . Diabetes Paternal Grandfather   . Diabetes Father   . Hypotension Father   . Colon cancer Paternal Grandmother   . Diabetes Paternal Grandmother   . Prostate cancer Maternal Grandfather   . Hyperlipidemia Mother   . Obesity Mother   . Breast cancer Maternal Aunt   . Arthritis Paternal Aunt   . Hyperlipidemia Maternal Aunt        x's 2    Social History   Socioeconomic History  . Marital status: Significant Other    Spouse name: Sharol Roussel  . Number of children: Not on file  . Years of education: Not on file  . Highest education level: Not on file  Occupational History  . Occupation: Manufacturing engineer: [employer]  Social Needs  . Financial resource strain: Not on file  . Food insecurity    Worry: Not on file    Inability: Not on file  . Transportation needs    Medical: Not on file    Non-medical: Not on file  Tobacco Use  . Smoking status: Never Smoker  . Smokeless tobacco: Never Used  Substance and Sexual Activity  . Alcohol use: Yes    Alcohol/week: 0.0 standard drinks    Comment: socially  . Drug use: No  . Sexual activity: Yes    Partners: Male    Birth control/protection: None  Lifestyle  . Physical activity    Days per week: Not on file    Minutes per session: Not on file  . Stress: Not on file  Relationships  . Social Herbalist on phone: Not on file    Gets together: Not on file    Attends religious service: Not on file    Active member of club or organization: Not on file    Attends meetings of clubs or organizations: Not on file    Relationship status: Not on file  . Intimate  partner violence    Fear of current or ex partner: Not on file    Emotionally abused: Not on file  Physically abused: Not on file    Forced sexual activity: Not on file  Other Topics Concern  . Not on file  Social History Narrative   Aeronautical engineer 3 days a week     Physical Exam: Complete physical exam not performed due to the limits inherent in a telehealth encounter.  General: Awake, alert, and oriented, and well communicative. In no acute distress.  HEENT: EOMI, non-icteric sclera, NCAT, MMM  Neck: Normal movement of head and neck  Pulm: No labored breathing, speaking in full sentences without conversational dyspnea  Derm: No apparent lesions or bruising in visible field  MS: Moves all visible extremities without noticeable abnormality  Psych: Pleasant, cooperative, normal speech, normal affect and normal insight Neuro: Alert and appropriate   Avigayil Ton L. Tarri Glenn, MD, MPH [facility] 07/22/2019, 11:41 AM

## 2019-08-04 ENCOUNTER — Ambulatory Visit (INDEPENDENT_AMBULATORY_CARE_PROVIDER_SITE_OTHER): Payer: BC Managed Care – PPO | Admitting: Physician Assistant

## 2019-08-04 ENCOUNTER — Encounter (INDEPENDENT_AMBULATORY_CARE_PROVIDER_SITE_OTHER): Payer: Self-pay

## 2019-08-19 ENCOUNTER — Ambulatory Visit (INDEPENDENT_AMBULATORY_CARE_PROVIDER_SITE_OTHER): Payer: BC Managed Care – PPO | Admitting: Physician Assistant

## 2019-08-19 ENCOUNTER — Other Ambulatory Visit: Payer: Self-pay

## 2019-08-19 VITALS — BP 118/77 | HR 82 | Temp 98.5°F | Ht 70.0 in | Wt 291.0 lb

## 2019-08-19 DIAGNOSIS — Z6841 Body Mass Index (BMI) 40.0 and over, adult: Secondary | ICD-10-CM

## 2019-08-19 DIAGNOSIS — E559 Vitamin D deficiency, unspecified: Secondary | ICD-10-CM | POA: Diagnosis not present

## 2019-08-19 NOTE — Progress Notes (Signed)
Office: [phone]  /  Fax: [phone]   HPI:   Chief Complaint: OBESITY Stacey Huerta is here to discuss her progress with her obesity treatment plan. She is keeping a food journal with 1200 to 1500 calories and 95 grams of protein and is following her eating plan approximately 75 % of the time. She states she is doing [employer] 60 minutes 5 times per week. Stacey Huerta reports that she got off track this past week when she was traveling. She is frustrated with lack of weight loss.  Her weight is 291 lb (132 kg) today and has had a weight gain of 7 pounds over a period of 4 weeks since her last visit. She has lost 0 lbs since starting treatment with [place].  Vitamin D Deficiency Stacey Huerta has a diagnosis of vitamin D deficiency. She is currently on vit D. Aspin denies nausea, vomiting, or muscle weakness.  ASSESSMENT AND PLAN:  Vitamin D deficiency  Class 3 severe obesity with serious comorbidity and body mass index (BMI) of 40.0 to 44.9 in adult, unspecified obesity type ([name])  PLAN:  Vitamin D Deficiency Barry was informed that low vitamin D levels contribute to fatigue and are associated with obesity, breast, and colon cancer. Omaya agrees to continue to take prescription Vit D @50 ,000 IU every week #4 with no refills and will follow up for routine testing of vitamin D, at least 2-3 times per year. She was informed of the risk of over-replacement of vitamin D and agrees to not increase her dose unless she discusses this with [place] first. Keelin agrees to follow up in 3 weeks as directed.  I spent > than 50% of the 25 minute visit on counseling as documented in the note.  Obesity [name] is currently in the action stage of change. As such, her goal is to continue with weight loss efforts. She has agreed to follow a lower carbohydrate, vegetable, and lean protein rich diet plan. Francetta has been instructed to work up to a goal of 150 minutes of combined cardio and strengthening  exercise per week for weight loss and overall health benefits. We discussed the following Behavioral Modification Strategies today: work on meal planning and easy cooking plans and keeping healthy foods in the home.  Stacey Huerta has agreed to follow up with our clinic in 3 weeks. She was informed of the importance of frequent follow up visits to maximize her success with intensive lifestyle modifications for her multiple health conditions.  I spent > than 50% of the 25 minute visit on counseling as documented in the note.    ALLERGIES: Allergies  Allergen Reactions  . Eggs Or Egg-Derived Products   . Metformin And Related     Possible lactic acidosis  . Sulfonamide Derivatives Hives    Also high fever  . Ganoderma Lucidum   . [place]   . Other     Mushrooms and [place] Nuts  . Penicillins     Childhood reaction  . Shellfish Allergy Nausea And Vomiting  . Fish-Derived Products Rash    MEDICATIONS: Current Outpatient Medications on File Prior to Visit  Medication Sig Dispense Refill  . aspirin EC 81 MG tablet Take 1 tablet (81 mg total) by mouth daily.    . B Complex-C-Folic Acid (B-COMPLEX BALANCED PO) Take by mouth.    . calcium carbonate 200 MG capsule Take 250 mg by mouth 2 (two) times daily with a meal.    . Cholecalciferol (VITAMIN D) 125 MCG (5000 UT) CAPS Take  1 capsule by mouth daily.    Marland Kitchen estradiol (ESTRACE) 2 MG tablet Take 2 mg by mouth daily.    Marland Kitchen letrozole (FEMARA) 2.5 MG tablet Take 5 mg by mouth daily.    Marland Kitchen liothyronine (CYTOMEL) 5 MCG tablet Take 10 mcg by mouth daily.    Marland Kitchen MAGNESIUM PO Take 1 tablet by mouth daily.    . metFORMIN (GLUCOPHAGE-XR) 500 MG 24 hr tablet Take 1 tablet (500 mg total) by mouth daily with breakfast. 90 tablet 0  . Nutritional Supplements (DHEA PO) Take 1 tablet by mouth daily.    . Omega-3 Fatty Acids (FISH OIL) 1000 MG CAPS Take by mouth.    . Prenatal Vit-Fe Fumarate-FA (PRENATAL VITAMIN PO) Take 1 tablet by mouth daily.    . Probiotic  Product (PROBIOTIC-10 PO) Take by mouth.    . progesterone (PROMETRIUM) 200 MG capsule Take 200 mg by mouth 2 (two) times daily.    Marland Kitchen pyridOXINE (VITAMIN B-6) 50 MG tablet Take 50 mg by mouth daily.    . SELENIUM PO Take 1 tablet by mouth daily.    . Vitamin D, Ergocalciferol, (DRISDOL) 1.25 MG (50000 UT) CAPS capsule Take 1 capsule (50,000 Units total) by mouth every 7 (seven) days. 4 capsule 0  . VITAMIN E PO Take 1 tablet by mouth daily.    Marland Kitchen zinc gluconate 50 MG tablet Take 50 mg by mouth daily.    . pantoprazole (PROTONIX) 40 MG tablet TAKE 1 TABLET BY MOUTH TWICE A DAY (Patient not taking: Reported on 08/19/2019) 60 tablet 1   Current Facility-Administered Medications on File Prior to Visit  Medication Dose Route Frequency Provider Last Rate Last Dose  . cyanocobalamin ((VITAMIN B-12)) injection 1,000 mcg  1,000 mcg Intramuscular Q30 days Carollee Herter, Kendrick Fries R, DO   1,000 mcg at 06/05/19 1010    PAST MEDICAL HISTORY: Past Medical History:  Diagnosis Date  . Allergy   . Back pain   . Dyspnea   . Fibroid   . Headache(784.0)   . Hypertension during pregnancy   . Infertility, female   . Leg edema   . Osteoarthritis   . Postpartum depression   . Sjogren's syndrome ([place]) 10/28/2015  . Vitamin B 12 deficiency   . Vitamin D deficiency     PAST SURGICAL HISTORY: Past Surgical History:  Procedure Laterality Date  . ABDOMINAL ADHESION SURGERY    . CESAREAN SECTION N/A 03/18/2014   Procedure: CESAREAN SECTION;  Surgeon: Delice Lesch, MD;  Location: [facility];  Service: Obstetrics;  Laterality: N/A;  spinal/epidural  . CHOLECYSTECTOMY    . ENDOMETRIAL ABLATION    . LYMPHADENECTOMY     Removed from the neck at age 43  . MYOMECTOMY    . MYOMECTOMY  05/23/2012   Procedure: MYOMECTOMY;  Surgeon: Governor Specking, MD;  Location: [facility];  Service: Gynecology;  Laterality: N/A;    SOCIAL HISTORY: Social History   Tobacco Use  . Smoking status: Never Smoker  . Smokeless tobacco:  Never Used  Substance Use Topics  . Alcohol use: Yes    Alcohol/week: 0.0 standard drinks    Comment: socially  . Drug use: No    FAMILY HISTORY: Family History  Problem Relation Age of Onset  . Hypertension Paternal Grandfather   . Diabetes Paternal Grandfather   . Diabetes Father   . Hypotension Father   . Colon cancer Paternal Grandmother   . Diabetes Paternal Grandmother   . Prostate cancer Maternal Grandfather   .  Hyperlipidemia Mother   . Obesity Mother   . Breast cancer Maternal Aunt   . Arthritis Paternal Aunt   . Hyperlipidemia Maternal Aunt        x's 2    ROS: Review of Systems  Gastrointestinal: Negative for nausea and vomiting.  Musculoskeletal:       Negative for muscle weakness.    PHYSICAL EXAM: Blood pressure 118/77, pulse 82, temperature 98.5 F (36.9 C), temperature source Oral, height 5\' 10"  (1.778 m), weight 291 lb (132 kg), SpO2 98 %. Body mass index is 41.75 kg/m. Physical Exam Vitals signs reviewed.  Constitutional:      Appearance: Normal appearance. She is obese.  Cardiovascular:     Rate and Rhythm: Normal rate.  Pulmonary:     Effort: Pulmonary effort is normal.  Musculoskeletal: Normal range of motion.  Skin:    General: Skin is warm and dry.  Neurological:     Mental Status: She is alert and oriented to person, place, and time.  Psychiatric:        Mood and Affect: Mood normal.        Behavior: Behavior normal.     RECENT LABS AND TESTS: BMET    Component Value Date/Time   NA 139 03/05/2019 1003   K 4.5 03/05/2019 1003   CL 104 03/05/2019 1003   CO2 22 03/05/2019 1003   GLUCOSE 82 03/05/2019 1003   GLUCOSE 104 (H) 05/02/2018 1216   BUN 12 03/05/2019 1003   CREATININE 0.87 03/05/2019 1003   CREATININE 0.98 08/19/2014 1223   CALCIUM 9.1 03/05/2019 1003   GFRNONAA 81 03/05/2019 1003   GFRAA 94 03/05/2019 1003   Lab Results  Component Value Date   HGBA1C 5.0 03/05/2019   HGBA1C 4.9 10/07/2018   HGBA1C 5.0  06/26/2018   HGBA1C 5.2 12/19/2015   HGBA1C 5.2 05/31/2008   Lab Results  Component Value Date   INSULIN 21.8 03/05/2019   INSULIN 10.5 10/07/2018   INSULIN 15.4 06/26/2018   CBC    Component Value Date/Time   WBC 4.4 06/26/2018 1130   WBC 4.1 05/02/2018 1216   RBC 4.38 06/26/2018 1130   RBC 4.23 05/02/2018 1216   HGB 13.6 06/26/2018 1130   HCT 41.1 06/26/2018 1130   PLT 182.0 05/02/2018 1216   MCV 94 06/26/2018 1130   MCH 31.1 06/26/2018 1130   MCH 30.0 08/19/2014 1226   MCH 32.4 03/20/2014 0812   MCHC 33.1 06/26/2018 1130   MCHC 33.6 05/02/2018 1216   RDW 12.0 (L) 06/26/2018 1130   LYMPHSABS 1.3 06/26/2018 1130   MONOABS 0.3 05/02/2018 1216   EOSABS 0.0 06/26/2018 1130   BASOSABS 0.0 06/26/2018 1130   Iron/TIBC/Ferritin/ %Sat No results found for: IRON, TIBC, FERRITIN, IRONPCTSAT Lipid Panel     Component Value Date/Time   CHOL 168 10/07/2018 1149   TRIG 106 10/07/2018 1149   HDL 50 10/07/2018 1149   CHOLHDL 4 12/19/2015 0852   VLDL 20.2 12/19/2015 0852   LDLCALC 97 10/07/2018 1149   Hepatic Function Panel     Component Value Date/Time   PROT 7.1 03/05/2019 1003   ALBUMIN 3.7 (L) 03/05/2019 1003   AST 13 03/05/2019 1003   ALT 11 03/05/2019 1003   ALKPHOS 105 03/05/2019 1003   BILITOT 0.6 03/05/2019 1003   BILIDIR 0.1 12/14/2014 1402   IBILI 0.9 04/30/2008 2117      Component Value Date/Time   TSH 2.420 06/26/2018 1130   TSH 1.59 05/02/2018 1216  TSH 1.34 12/14/2014 1402   Results for TAMEKI, NIESS (MRN [id number]) as of 08/19/2019 14:21  Ref. Range 03/05/2019 10:03  Vitamin D, 25-Hydroxy Latest Ref Range: 30.0 - 100.0 ng/mL 53.5     OBESITY BEHAVIORAL INTERVENTION VISIT  Today's visit was # 21   Starting weight: 290 lbs Starting date: 06/26/2018 Today's weight : Weight: 291 lb (132 kg)  Today's date: 08/19/2019 Total lbs lost to date: 0    08/19/2019  Height 5\' 10"  (1.778 m)  Weight 291 lb (132 kg)  BMI (Calculated) 41.75  BLOOD  PRESSURE - SYSTOLIC 123456  BLOOD PRESSURE - DIASTOLIC 77   Body Fat % 48 %  Total Body Water (lbs) 102.2 lbs   ASK: We discussed the diagnosis of obesity with Lashandra Ava Pamella Pert today and Genifer agreed to give [place] permission to discuss obesity behavioral modification therapy today.  ASSESS: Shenia has the diagnosis of obesity and her BMI today is 41.75. Hang is in the action stage of change.   ADVISE: Kasen was educated on the multiple health risks of obesity as well as the benefit of weight loss to improve her health. She was advised of the need for long term treatment and the importance of lifestyle modifications to improve her current health and to decrease her risk of future health problems.  AGREE: Multiple dietary modification options and treatment options were discussed and Dannica agreed to follow the recommendations documented in the above note.  ARRANGE: Matsue was educated on the importance of frequent visits to treat obesity as outlined per CMS and USPSTF guidelines and agreed to schedule her next follow up appointment today.  Lenward Chancellor, CMA, am acting as transcriptionist for Abby Potash, PA-C I, Abby Potash, PA-C have reviewed above note and agree with its content

## 2019-08-23 ENCOUNTER — Encounter: Payer: Self-pay | Admitting: Family Medicine

## 2019-08-24 NOTE — Telephone Encounter (Signed)
I would need to see all the blood work done or she can come in so we can see her and run tests

## 2019-09-01 IMAGING — CT CT ABDOMEN AND PELVIS WITH CONTRAST
2 of 5 series · 16 of 46 positions shown, 18 images · IV contrast (omnipaque)
Comparison: CT of the abdomen pelvis dated 01/02/2016

CLINICAL DATA: 43-year-old female with bloating and lower abdominal
pain.

EXAM:
CT ABDOMEN AND PELVIS WITH CONTRAST
TECHNIQUE: Multidetector CT imaging of the abdomen and pelvis was performed
using the standard protocol following bolus administration of
intravenous contrast.
CONTRAST:  100mL OMNIPAQUE IOHEXOL 300 MG/ML  SOLN

[Series 2: axial st · axial · 0.98mm/px · z∈[-397,+38]mm · 13 of 101 slices shown, 15 images]
[im 7/101  soft-tissue]
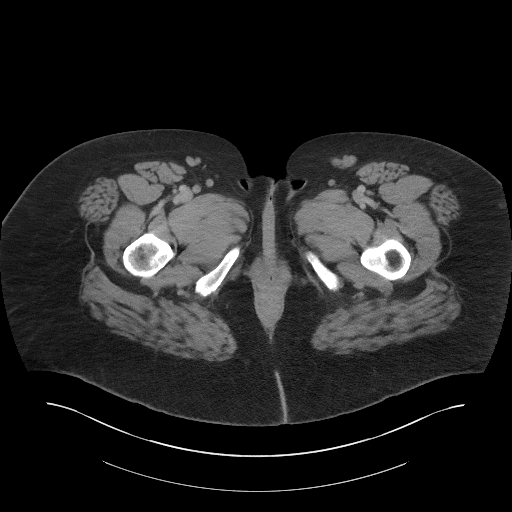
[im 7/101  bone]
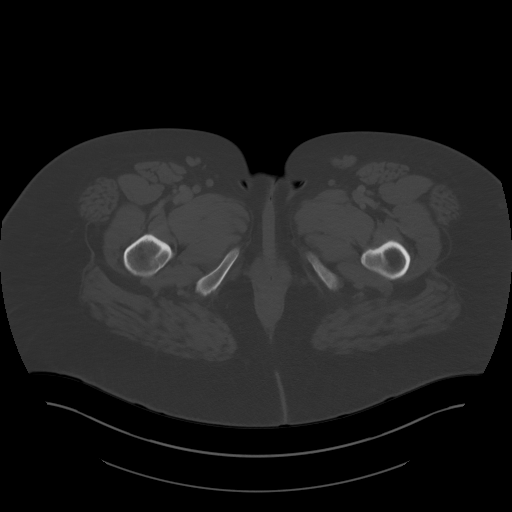
[im 14/101  soft-tissue]
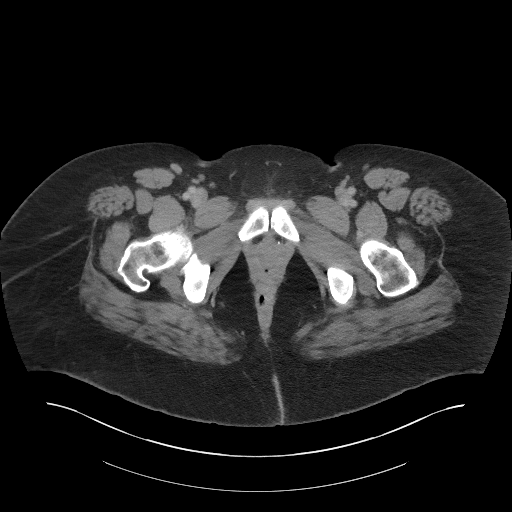
[im 21/101  soft-tissue]
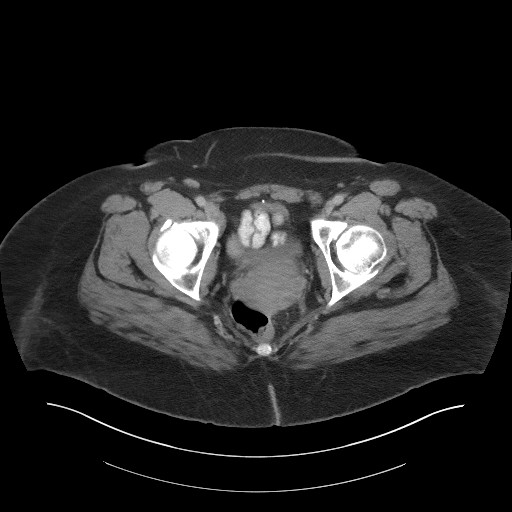
[im 27/101  soft-tissue]
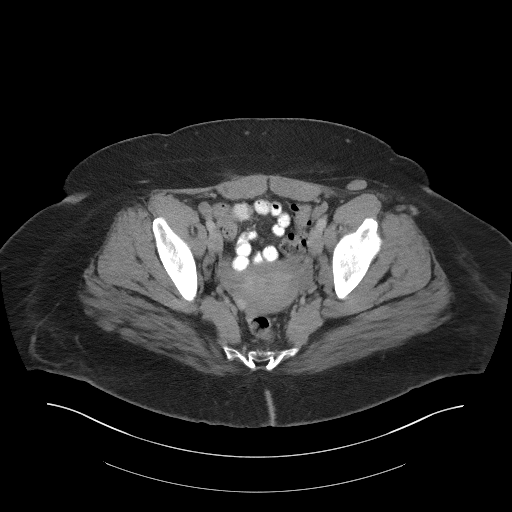
[im 34/101  soft-tissue]
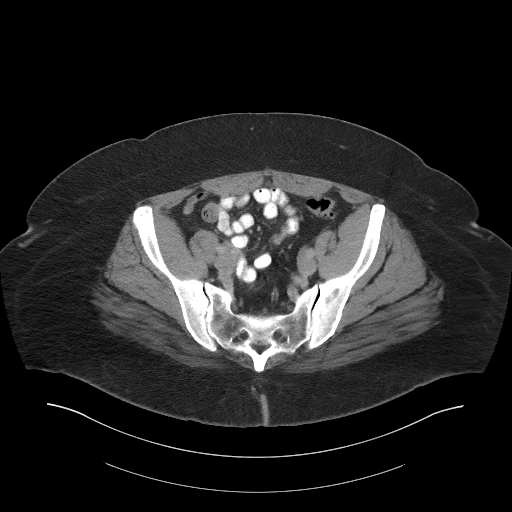
[im 41/101  soft-tissue]
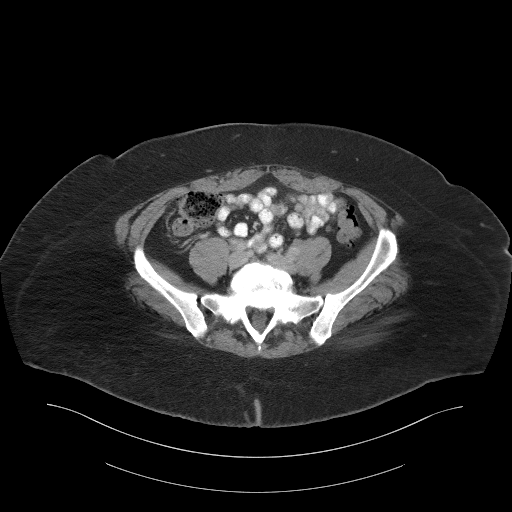
[im 54/101  soft-tissue]
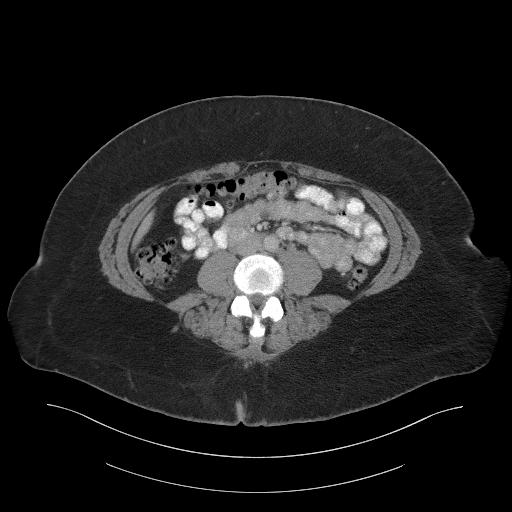
[im 61/101  soft-tissue]
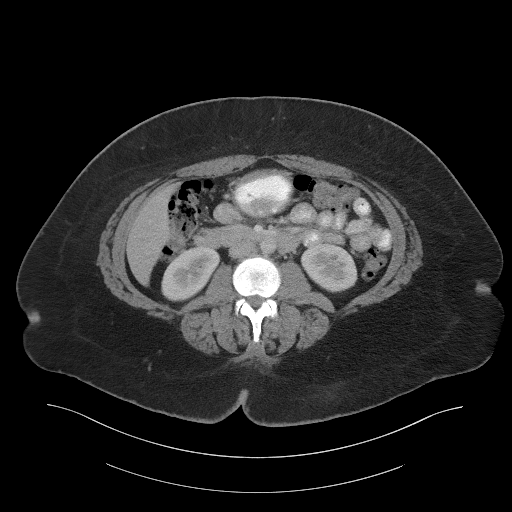
[im 67/101  soft-tissue]
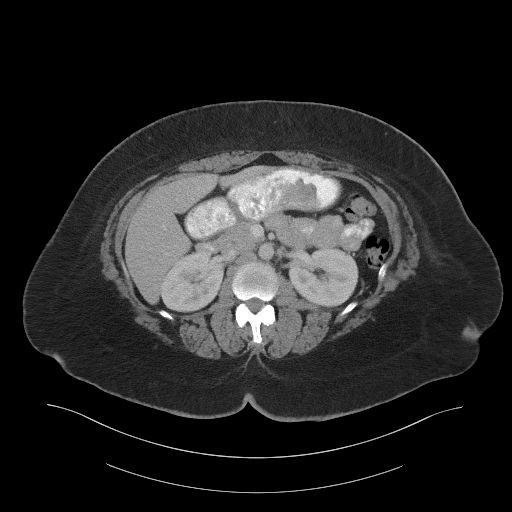
[im 67/101  bone]
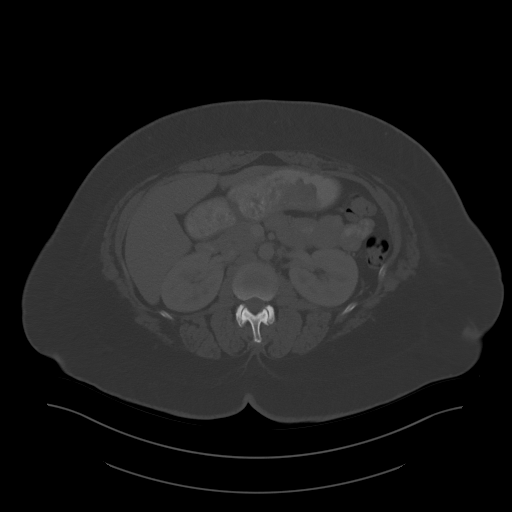
[im 74/101  soft-tissue]
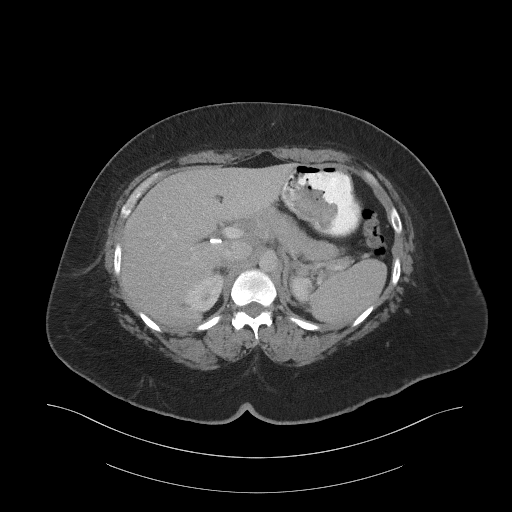
[im 81/101  soft-tissue]
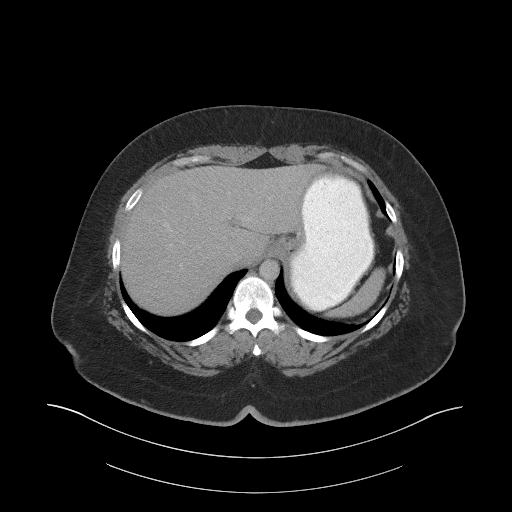
[im 87/101  soft-tissue]
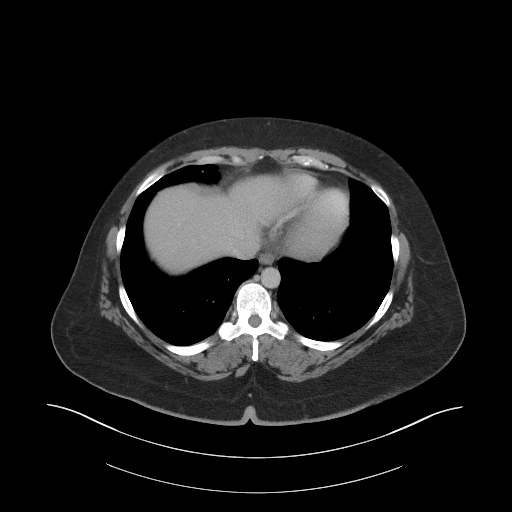
[im 94/101  soft-tissue]
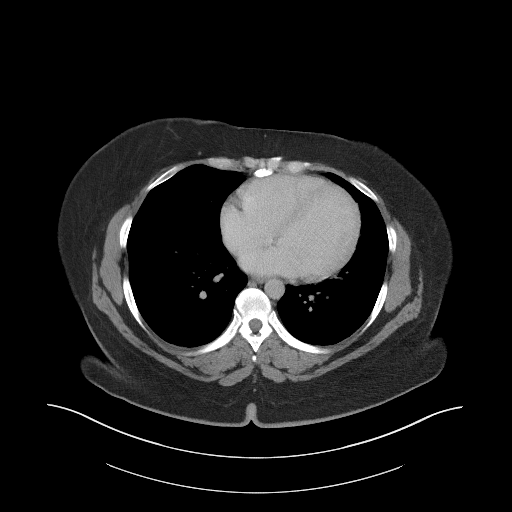

[Series 5: coronal st · coronal · 0.83mm/px · 3 of 117 slices shown]
[im 39/117  soft-tissue]
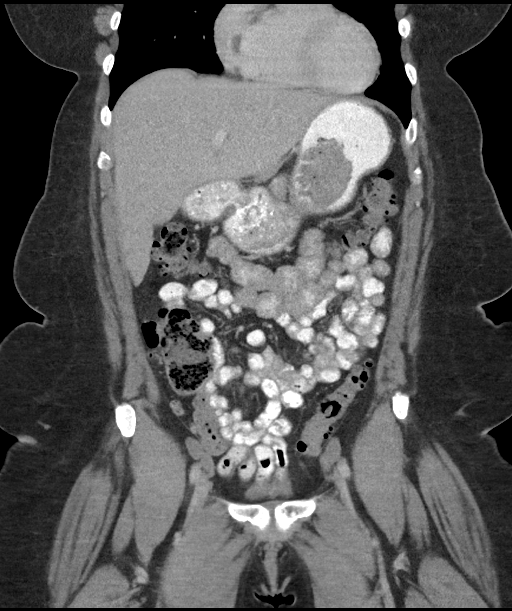
[im 52/117  soft-tissue]
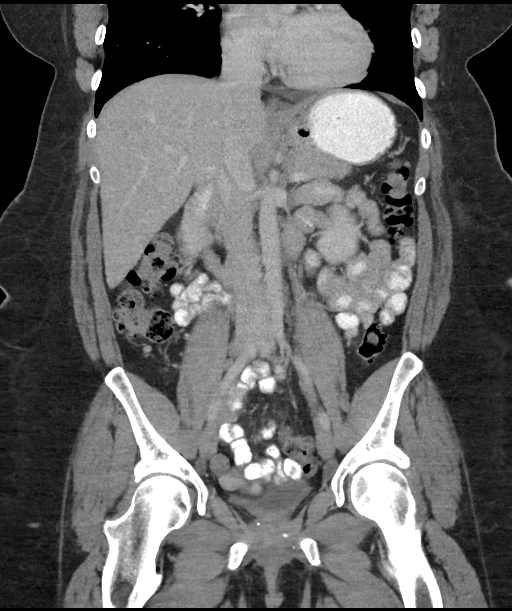
[im 65/117  soft-tissue]
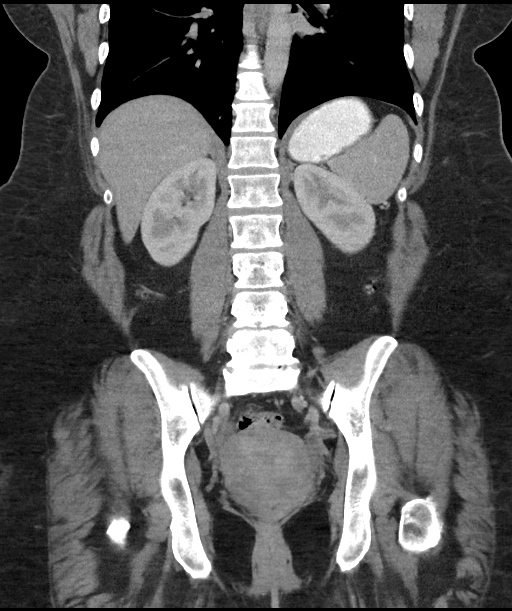

[16 of 46 positions shown; findings below may reference images not displayed]

FINDINGS: Lower chest: The visualized lung bases are clear.

No intra-abdominal free air or free fluid.

Hepatobiliary: Probable mild fatty infiltration of the liver. No
intrahepatic biliary ductal dilatation. Cholecystectomy.

Pancreas: Unremarkable. No pancreatic ductal dilatation or
surrounding inflammatory changes.

Spleen: Normal in size without focal abnormality.

Adrenals/Urinary Tract: Adrenal glands are unremarkable. There is no
hydronephrosis on either side. There is symmetric enhancement and
excretion of contrast by both kidneys. The urinary bladder is
collapsed.

Stomach/Bowel: There is diffuse colonic diverticulosis without
active inflammatory changes. There is no bowel obstruction or active
inflammation. The appendix is normal.

Vascular/Lymphatic: The abdominal aorta and IVC appear unremarkable.
No portal venous gas. There is no adenopathy.

Reproductive: The uterus appears retroverted and anteflexed. There
is a 15 mm right ovarian dominant follicle or cyst. The left ovary
is grossly unremarkable as visualized.

Other: None

Musculoskeletal: Degenerative changes of the lower lumbar spine. No
acute osseous pathology.
IMPRESSION: 1. No acute intra-abdominal or pelvic pathology.
2. Diffuse colonic diverticulosis. No bowel obstruction or active
inflammation. Normal appendix.
3. A 15 mm right ovarian dominant follicle or cyst.

## 2019-09-02 ENCOUNTER — Encounter: Payer: Self-pay | Admitting: Gastroenterology

## 2019-09-04 ENCOUNTER — Encounter: Payer: BC Managed Care – PPO | Admitting: Gastroenterology

## 2019-09-04 ENCOUNTER — Telehealth: Payer: Self-pay | Admitting: Gastroenterology

## 2019-09-04 NOTE — Telephone Encounter (Signed)
Noted. Thanks.

## 2019-09-04 NOTE — Telephone Encounter (Signed)
Pt said she forgot about her procedure this morning and rescheduled until 09/11/2019

## 2019-09-09 ENCOUNTER — Other Ambulatory Visit: Payer: Self-pay

## 2019-09-09 ENCOUNTER — Ambulatory Visit (INDEPENDENT_AMBULATORY_CARE_PROVIDER_SITE_OTHER): Payer: BC Managed Care – PPO | Admitting: Physician Assistant

## 2019-09-09 ENCOUNTER — Encounter (INDEPENDENT_AMBULATORY_CARE_PROVIDER_SITE_OTHER): Payer: Self-pay | Admitting: Physician Assistant

## 2019-09-09 VITALS — BP 117/73 | HR 104 | Temp 98.5°F | Ht 70.0 in | Wt 289.0 lb

## 2019-09-09 DIAGNOSIS — Z6841 Body Mass Index (BMI) 40.0 and over, adult: Secondary | ICD-10-CM

## 2019-09-09 DIAGNOSIS — E559 Vitamin D deficiency, unspecified: Secondary | ICD-10-CM

## 2019-09-09 DIAGNOSIS — Z9189 Other specified personal risk factors, not elsewhere classified: Secondary | ICD-10-CM

## 2019-09-09 DIAGNOSIS — E8881 Metabolic syndrome: Secondary | ICD-10-CM

## 2019-09-09 MED ORDER — VITAMIN D (ERGOCALCIFEROL) 1.25 MG (50000 UNIT) PO CAPS: 50000.0000 [IU] | ORAL_CAPSULE | ORAL | 0 refills | Status: DC

## 2019-09-11 ENCOUNTER — Encounter: Payer: Self-pay | Admitting: Gastroenterology

## 2019-09-11 ENCOUNTER — Ambulatory Visit (AMBULATORY_SURGERY_CENTER): Payer: BC Managed Care – PPO | Admitting: Gastroenterology

## 2019-09-11 ENCOUNTER — Other Ambulatory Visit: Payer: Self-pay

## 2019-09-11 VITALS — BP 124/77 | HR 78 | Temp 98.4°F | Resp 18 | Ht 70.0 in | Wt 291.0 lb

## 2019-09-11 DIAGNOSIS — K449 Diaphragmatic hernia without obstruction or gangrene: Secondary | ICD-10-CM

## 2019-09-11 DIAGNOSIS — R140 Abdominal distension (gaseous): Secondary | ICD-10-CM

## 2019-09-11 DIAGNOSIS — K259 Gastric ulcer, unspecified as acute or chronic, without hemorrhage or perforation: Secondary | ICD-10-CM

## 2019-09-11 MED ORDER — SODIUM CHLORIDE 0.9 % IV SOLN: 500.0000 mL | Freq: Once | INTRAVENOUS | Status: DC

## 2019-09-11 NOTE — Op Note (Signed)
[facility] Patient Name: Stacey Huerta Procedure Date: 09/11/2019 9:58 AM MRN: [id number] Endoscopist: Thornton Park MD, MD Age: 43 Referring MD:  Date of Birth: [birth year] Gender: Female Account #: [id number] Procedure:                Upper GI endoscopy Indications:              Follow-up of acute gastric ulcer                           Two gastric ulcers and gastric erosions on EGD                            05/08/19                           Biopsies negative for H pylori Medicines:                Monitored Anesthesia Care Procedure:                Pre-Anesthesia Assessment:                           - Prior to the procedure, a History and Physical                            was performed, and patient medications and                            allergies were reviewed. The patient's tolerance of                            previous anesthesia was also reviewed. The risks                            and benefits of the procedure and the sedation                            options and risks were discussed with the patient.                            All questions were answered, and informed consent                            was obtained. Prior Anticoagulants: The patient has                            taken no previous anticoagulant or antiplatelet                            agents. ASA Grade Assessment: II - A patient with                            mild systemic disease. After reviewing the risks  and benefits, the patient was deemed in                            satisfactory condition to undergo the procedure.                           After obtaining informed consent, the endoscope was                            passed under direct vision. Throughout the                            procedure, the patient's blood pressure, pulse, and                            oxygen saturations were monitored continuously. The   Endoscope was introduced through the mouth, and                            advanced to the third part of duodenum. The upper                            GI endoscopy was accomplished without difficulty.                            The patient tolerated the procedure well. Scope In: Scope Out: Findings:                 The esophagus was normal.                           A small hiatal hernia was present.                           The entire examined stomach was normal.                           The examined duodenum was normal.                           The exam was otherwise without abnormality. Complications:            No immediate complications. Estimated Blood Loss:     Estimated blood loss: none. Impression:               - Normal esophagus.                           - Small hiatal hernia.                           - Normal stomach. The ulcers seen in July 2020 have                            healed.                           -  Normal examined duodenum.                           - The examination was otherwise normal.                           - No specimens collected. Recommendation:           - Patient has a contact number available for                            emergencies. The signs and symptoms of potential                            delayed complications were discussed with the                            patient. Return to normal activities tomorrow.                            Written discharge instructions were provided to the                            patient.                           - Resume previous diet today.                           - Continue present medications.                           - Return to GI clinic for further follow-up. Thornton Park MD, MD 09/11/2019 10:14:43 AM This report has been signed electronically.

## 2019-09-11 NOTE — Progress Notes (Signed)
A/ox3, pleased with MAC, report to RN 

## 2019-09-11 NOTE — Progress Notes (Signed)
CW vitals, SM Iv and JB temps.

## 2019-09-11 NOTE — Patient Instructions (Signed)
  Read handout on Hiatal Hernia.  YOU HAD AN ENDOSCOPIC PROCEDURE TODAY AT [facility]:   Refer to the procedure report that was given to you for any specific questions about what was found during the examination.  If the procedure report does not answer your questions, please call your gastroenterologist to clarify.  If you requested that your care partner not be given the details of your procedure findings, then the procedure report has been included in a sealed envelope for you to review at your convenience later.  YOU SHOULD EXPECT: Some feelings of bloating in the abdomen. Passage of more gas than usual.  Walking can help get rid of the air that was put into your GI tract during the procedure and reduce the bloating. If you had a lower endoscopy (such as a colonoscopy or flexible sigmoidoscopy) you may notice spotting of blood in your stool or on the toilet paper. If you underwent a bowel prep for your procedure, you may not have a normal bowel movement for a few days.  Please Note:  You might notice some irritation and congestion in your nose or some drainage.  This is from the oxygen used during your procedure.  There is no need for concern and it should clear up in a day or so.  SYMPTOMS TO REPORT IMMEDIATELY:    Following upper endoscopy (EGD)  Vomiting of blood or coffee ground material  New chest pain or pain under the shoulder blades  Painful or persistently difficult swallowing  New shortness of breath  Fever of 100F or higher  Black, tarry-looking stools  For urgent or emergent issues, a gastroenterologist can be reached at any hour by calling [phone].   DIET:  We do recommend a small meal at first, but then you may proceed to your regular diet.  Drink plenty of fluids but you should avoid alcoholic beverages for 24 hours.  ACTIVITY:  You should plan to take it easy for the rest of today and you should NOT DRIVE or use heavy machinery until tomorrow  (because of the sedation medicines used during the test).    FOLLOW UP: Our staff will call the number listed on your records 48-72 hours following your procedure to check on you and address any questions or concerns that you may have regarding the information given to you following your procedure. If we do not reach you, we will leave a message.  We will attempt to reach you two times.  During this call, we will ask if you have developed any symptoms of COVID 19. If you develop any symptoms (ie: fever, flu-like symptoms, shortness of breath, cough etc.) before then, please call [phone].  If you test positive for Covid 19 in the 2 weeks post procedure, please call and report this information to [place].    If any biopsies were taken you will be contacted by phone or by letter within the next 1-3 weeks.  Please call us at [phone] if you have not heard about the biopsies in 3 weeks.    SIGNATURES/CONFIDENTIALITY: You and/or your care partner have signed paperwork which will be entered into your electronic medical record.  These signatures attest to the fact that that the information above on your After Visit Summary has been reviewed and is understood.  Full responsibility of the confidentiality of this discharge information lies with you and/or your care-partner.

## 2019-09-13 NOTE — Progress Notes (Signed)
Office: [phone]  /  Fax: [phone]   HPI:   Chief Complaint: OBESITY Jeda is here to discuss her progress with her obesity treatment plan. She is on the keep a food journal with 1500 calories and 85 to 90 grams of protein daily and is following her eating plan approximately 95 % of the time. She states she is doing [employer] 60 minutes 5 times per week. Latajah reports that she did not try the low carb plan, because she had food in the house that she did not want to go bad, so she ate it. Luanne is seeing [employer]. Her weight is 289 lb (131.1 kg) today and has had a weight loss of 2 pounds over a period of 3 weeks since her last visit. She has lost 1 lb since starting treatment with [place].  Vitamin D deficiency [place] has a diagnosis of vitamin D deficiency. Ximenna is currently taking vit D and denies nausea, vomiting or muscle weakness.  At risk for osteopenia and osteoporosis Alka is at higher risk of osteopenia and osteoporosis due to vitamin D deficiency.   Insulin Resistance Valbona has a diagnosis of insulin resistance based on her elevated fasting insulin level >5. Although Shawan's blood glucose readings are still under good control, insulin resistance puts her at greater risk of metabolic syndrome and diabetes. She stopped taking metformin on her own, because she felt as if it was affecting her workouts. She continues to work on diet and exercise to decrease risk of diabetes.  ASSESSMENT AND PLAN:  Vitamin D deficiency - Plan: Vitamin D, Ergocalciferol, (DRISDOL) 1.25 MG (50000 UT) CAPS capsule  Insulin resistance  At risk for osteoporosis  Class 3 severe obesity with serious comorbidity and body mass index (BMI) of 40.0 to 44.9 in adult, unspecified obesity type ([place])  PLAN:  Vitamin D Deficiency Madigan was informed that low vitamin D levels contributes to fatigue and are associated with obesity, breast, and colon cancer. Allianna agrees to  continue to take prescription Vit D @50 ,000 IU every week #4 with no refills and she will follow up for routine testing of vitamin D, at least 2-3 times per year. She was informed of the risk of over-replacement of vitamin D and agrees to not increase her dose unless she discusses this with [place] first. Chong agrees to follow up with our clinic in 3 weeks.  At risk for osteopenia and osteoporosis Meiling was given extended  (15 minutes) osteoporosis prevention counseling today. Jesilyn is at risk for osteopenia and osteoporosis due to her vitamin D deficiency. She was encouraged to take her vitamin D and follow her higher calcium diet and increase strengthening exercise to help strengthen her bones and decrease her risk of osteopenia and osteoporosis.  Insulin Resistance Manya will continue to work on weight loss, exercise, and decreasing simple carbohydrates in her diet to help decrease the risk of diabetes. She was informed that eating too many simple carbohydrates or too many calories at one sitting increases the likelihood of GI side effects. Abrea agreed to follow up with [place] as directed to monitor her progress.  Obesity Joshalyn is currently in the action stage of change. As such, her goal is to continue with weight loss efforts She has agreed to keep a food journal with 1500 calories and 95 grams of protein daily  Aveah has been instructed to work up to a goal of 150 minutes of combined cardio and strengthening exercise per week for weight loss and  overall health benefits. We discussed the following Behavioral Modification Strategies today: keeping healthy foods in the home and work on meal planning and easy cooking plans  We will check indirect calorimeter and labs at the next visit. Shayanna has agreed to follow up with our clinic in 3 weeks. She was informed of the importance of frequent follow up visits to maximize her success with intensive lifestyle modifications for her multiple  health conditions.  ALLERGIES: Allergies  Allergen Reactions   Eggs Or Egg-Derived Products    Metformin And Related     Possible lactic acidosis   Sulfonamide Derivatives Hives    Also high fever   Ganoderma Lucidum    [place]    Other     Mushrooms and [place] Nuts   Penicillins     Childhood reaction   Shellfish Allergy Nausea And Vomiting   Fish-Derived Products Rash    MEDICATIONS: Current Outpatient Medications on File Prior to Visit  Medication Sig Dispense Refill   aspirin EC 81 MG tablet Take 1 tablet (81 mg total) by mouth daily.     B Complex-C-Folic Acid (B-COMPLEX BALANCED PO) Take by mouth.     calcium carbonate 200 MG capsule Take 250 mg by mouth 2 (two) times daily with a meal.     Cholecalciferol (VITAMIN D) 125 MCG (5000 UT) CAPS Take 1 capsule by mouth daily.     estradiol (ESTRACE) 2 MG tablet Take 2 mg by mouth daily.     letrozole (FEMARA) 2.5 MG tablet Take 5 mg by mouth daily.     liothyronine (CYTOMEL) 5 MCG tablet Take 10 mcg by mouth daily.     MAGNESIUM PO Take 1 tablet by mouth daily.     metFORMIN (GLUCOPHAGE-XR) 500 MG 24 hr tablet Take 1 tablet (500 mg total) by mouth daily with breakfast. 90 tablet 0   Nutritional Supplements (DHEA PO) Take 1 tablet by mouth daily.     Omega-3 Fatty Acids (FISH OIL) 1000 MG CAPS Take by mouth.     pantoprazole (PROTONIX) 40 MG tablet TAKE 1 TABLET BY MOUTH TWICE A DAY 60 tablet 1   Prenatal Vit-Fe Fumarate-FA (PRENATAL VITAMIN PO) Take 1 tablet by mouth daily.     Probiotic Product (PROBIOTIC-10 PO) Take by mouth.     progesterone (PROMETRIUM) 200 MG capsule Take 200 mg by mouth 2 (two) times daily.     pyridOXINE (VITAMIN B-6) 50 MG tablet Take 50 mg by mouth daily.     SELENIUM PO Take 1 tablet by mouth daily.     VITAMIN E PO Take 1 tablet by mouth daily.     zinc gluconate 50 MG tablet Take 50 mg by mouth daily.     Current Facility-Administered Medications on File Prior to  Visit  Medication Dose Route Frequency Provider Last Rate Last Dose   cyanocobalamin ((VITAMIN B-12)) injection 1,000 mcg  1,000 mcg Intramuscular Q30 days Carollee Herter, Yvonne R, DO   1,000 mcg at 06/05/19 1010    PAST MEDICAL HISTORY: Past Medical History:  Diagnosis Date   Allergy    Back pain    Dyspnea    Fibroid    Headache(784.0)    Hypertension during pregnancy    Infertility, female    Leg edema    Osteoarthritis    Postpartum depression    Sjogren's syndrome ([place]) 10/28/2015   Vitamin B 12 deficiency    Vitamin D deficiency     PAST SURGICAL HISTORY: Past Surgical History:  Procedure Laterality Date   ABDOMINAL ADHESION SURGERY     CESAREAN SECTION N/A 03/18/2014   Procedure: CESAREAN SECTION;  Surgeon: Delice Lesch, MD;  Location: [facility];  Service: Obstetrics;  Laterality: N/A;  spinal/epidural   CHOLECYSTECTOMY     ENDOMETRIAL ABLATION     LYMPHADENECTOMY     Removed from the neck at age 61   MYOMECTOMY     MYOMECTOMY  05/23/2012   Procedure: MYOMECTOMY;  Surgeon: Governor Specking, MD;  Location: [facility];  Service: Gynecology;  Laterality: N/A;    SOCIAL HISTORY: Social History   Tobacco Use   Smoking status: Never Smoker   Smokeless tobacco: Never Used  Substance Use Topics   Alcohol use: Yes    Alcohol/week: 0.0 standard drinks    Comment: socially   Drug use: No    FAMILY HISTORY: Family History  Problem Relation Age of Onset   Hypertension Paternal Grandfather    Diabetes Paternal Grandfather    Diabetes Father    Hypotension Father    Colon cancer Paternal Grandmother    Diabetes Paternal Grandmother    Prostate cancer Maternal Grandfather    Hyperlipidemia Mother    Obesity Mother    Breast cancer Maternal Aunt    Arthritis Paternal Aunt    Hyperlipidemia Maternal Aunt        x's 2   Esophageal cancer Neg Hx    Stomach cancer Neg Hx     ROS: Review of Systems  Constitutional: Positive for  weight loss.  Gastrointestinal: Negative for nausea and vomiting.  Musculoskeletal:       Negative for muscle weakness    PHYSICAL EXAM: Blood pressure 117/73, pulse (!) 104, temperature 98.5 F (36.9 C), temperature source Oral, height 5\' 10"  (1.778 m), weight 289 lb (131.1 kg), last menstrual period 09/01/2019, SpO2 97 %. Body mass index is 41.47 kg/m. Physical Exam Vitals signs reviewed.  Constitutional:      Appearance: Normal appearance. She is well-developed. She is obese.  Cardiovascular:     Rate and Rhythm: Normal rate.  Pulmonary:     Effort: Pulmonary effort is normal.  Musculoskeletal: Normal range of motion.  Skin:    General: Skin is warm and dry.  Neurological:     Mental Status: She is alert and oriented to person, place, and time.  Psychiatric:        Mood and Affect: Mood normal.        Behavior: Behavior normal.     RECENT LABS AND TESTS: BMET    Component Value Date/Time   NA 139 03/05/2019 1003   K 4.5 03/05/2019 1003   CL 104 03/05/2019 1003   CO2 22 03/05/2019 1003   GLUCOSE 82 03/05/2019 1003   GLUCOSE 104 (H) 05/02/2018 1216   BUN 12 03/05/2019 1003   CREATININE 0.87 03/05/2019 1003   CREATININE 0.98 08/19/2014 1223   CALCIUM 9.1 03/05/2019 1003   GFRNONAA 81 03/05/2019 1003   GFRAA 94 03/05/2019 1003   Lab Results  Component Value Date   HGBA1C 5.0 03/05/2019   HGBA1C 4.9 10/07/2018   HGBA1C 5.0 06/26/2018   HGBA1C 5.2 12/19/2015   HGBA1C 5.2 05/31/2008   Lab Results  Component Value Date   INSULIN 21.8 03/05/2019   INSULIN 10.5 10/07/2018   INSULIN 15.4 06/26/2018   CBC    Component Value Date/Time   WBC 4.4 06/26/2018 1130   WBC 4.1 05/02/2018 1216   RBC 4.38 06/26/2018 1130   RBC 4.23  05/02/2018 1216   HGB 13.6 06/26/2018 1130   HCT 41.1 06/26/2018 1130   PLT 182.0 05/02/2018 1216   MCV 94 06/26/2018 1130   MCH 31.1 06/26/2018 1130   MCH 30.0 08/19/2014 1226   MCH 32.4 03/20/2014 0812   MCHC 33.1 06/26/2018 1130     MCHC 33.6 05/02/2018 1216   RDW 12.0 (L) 06/26/2018 1130   LYMPHSABS 1.3 06/26/2018 1130   MONOABS 0.3 05/02/2018 1216   EOSABS 0.0 06/26/2018 1130   BASOSABS 0.0 06/26/2018 1130   Iron/TIBC/Ferritin/ %Sat No results found for: IRON, TIBC, FERRITIN, IRONPCTSAT Lipid Panel     Component Value Date/Time   CHOL 168 10/07/2018 1149   TRIG 106 10/07/2018 1149   HDL 50 10/07/2018 1149   CHOLHDL 4 12/19/2015 0852   VLDL 20.2 12/19/2015 0852   LDLCALC 97 10/07/2018 1149   Hepatic Function Panel     Component Value Date/Time   PROT 7.1 03/05/2019 1003   ALBUMIN 3.7 (L) 03/05/2019 1003   AST 13 03/05/2019 1003   ALT 11 03/05/2019 1003   ALKPHOS 105 03/05/2019 1003   BILITOT 0.6 03/05/2019 1003   BILIDIR 0.1 12/14/2014 1402   IBILI 0.9 04/30/2008 2117      Component Value Date/Time   TSH 2.420 06/26/2018 1130   TSH 1.59 05/02/2018 1216   TSH 1.34 12/14/2014 1402     Ref. Range 03/05/2019 10:03  Vitamin D, 25-Hydroxy Latest Ref Range: 30.0 - 100.0 ng/mL 53.5    OBESITY BEHAVIORAL INTERVENTION VISIT  Today's visit was # 22  Starting weight: 290 lbs Starting date: 06/26/2018 Today's weight : 289 lbs Today's date: 09/09/2019 Total lbs lost to date: 1    09/09/2019  Height 5\' 10"  (1.778 m)  Weight 289 lb (131.1 kg)  BMI (Calculated) 41.47  BLOOD PRESSURE - SYSTOLIC [id number]  BLOOD PRESSURE - DIASTOLIC 73   Body Fat % 123456 %  Total Body Water (lbs) 100.2 lbs    ASK: We discussed the diagnosis of obesity with Zakiyah Ava Pamella Pert today and Lajoyce agreed to give [place] permission to discuss obesity behavioral modification therapy today.  ASSESS: Robby has the diagnosis of obesity and her BMI today is 41.47 Bertina is in the action stage of change   ADVISE: Idalina was educated on the multiple health risks of obesity as well as the benefit of weight loss to improve her health. She was advised of the need for long term treatment and the importance of lifestyle modifications  to improve her current health and to decrease her risk of future health problems.  AGREE: Multiple dietary modification options and treatment options were discussed and  Chairty agreed to follow the recommendations documented in the above note.  ARRANGE: Karely was educated on the importance of frequent visits to treat obesity as outlined per CMS and USPSTF guidelines and agreed to schedule her next follow up appointment today.  Corey Skains, am acting as transcriptionist for Abby Potash, PA-C I, Abby Potash, PA-C have reviewed above note and agree with its content

## 2019-09-15 ENCOUNTER — Telehealth: Payer: Self-pay

## 2019-09-15 NOTE — Telephone Encounter (Signed)
Left message on follow up call. 

## 2019-09-15 NOTE — Telephone Encounter (Signed)
Covid-19 screening questions   Do you now or have you had a fever in the last 14 days? No.  Do you have any respiratory symptoms of shortness of breath or cough now or in the last 14 days? No.  Do you have any family members or close contacts with diagnosed or suspected Covid-19 in the past 14 days? No. Have you been tested for Covid-19 and found to be positive? No.       Follow up Call-  Call back number 09/11/2019 05/08/2019  Post procedure Call Back phone  # [phone]  Permission to leave phone message Yes Yes  Some recent data might be hidden     Patient questions:  Do you have a fever, pain , or abdominal swelling? No. Pain Score  0 *  Have you tolerated food without any problems? Yes.    Have you been able to return to your normal activities? Yes.    Do you have any questions about your discharge instructions: Diet   No. Medications  No. Follow up visit  No.  Do you have questions or concerns about your Care? No.  Actions: * If pain score is 4 or above: No action needed, pain <4.

## 2019-10-01 ENCOUNTER — Ambulatory Visit (INDEPENDENT_AMBULATORY_CARE_PROVIDER_SITE_OTHER): Payer: BC Managed Care – PPO | Admitting: Physician Assistant

## 2019-10-01 ENCOUNTER — Encounter (INDEPENDENT_AMBULATORY_CARE_PROVIDER_SITE_OTHER): Payer: Self-pay | Admitting: Physician Assistant

## 2019-10-01 ENCOUNTER — Other Ambulatory Visit: Payer: Self-pay

## 2019-10-01 ENCOUNTER — Encounter: Payer: Self-pay | Admitting: Physician Assistant

## 2019-10-01 VITALS — BP 106/69 | HR 94 | Temp 98.1°F | Ht 70.0 in | Wt 289.0 lb

## 2019-10-01 DIAGNOSIS — E7849 Other hyperlipidemia: Secondary | ICD-10-CM | POA: Diagnosis not present

## 2019-10-01 DIAGNOSIS — E8881 Metabolic syndrome: Secondary | ICD-10-CM | POA: Diagnosis not present

## 2019-10-01 DIAGNOSIS — Z9189 Other specified personal risk factors, not elsewhere classified: Secondary | ICD-10-CM | POA: Diagnosis not present

## 2019-10-01 DIAGNOSIS — R0602 Shortness of breath: Secondary | ICD-10-CM | POA: Diagnosis not present

## 2019-10-01 DIAGNOSIS — E559 Vitamin D deficiency, unspecified: Secondary | ICD-10-CM | POA: Diagnosis not present

## 2019-10-01 DIAGNOSIS — Z6841 Body Mass Index (BMI) 40.0 and over, adult: Secondary | ICD-10-CM

## 2019-10-02 LAB — COMPREHENSIVE METABOLIC PANEL
ALT: 14 IU/L (ref 0–32)
AST: 20 IU/L (ref 0–40)
Albumin/Globulin Ratio: 1.1 — ABNORMAL LOW (ref 1.2–2.2)
Albumin: 4.1 g/dL (ref 3.8–4.8)
Alkaline Phosphatase: 90 IU/L (ref 39–117)
BUN/Creatinine Ratio: 10 (ref 9–23)
BUN: 10 mg/dL (ref 6–24)
Bilirubin Total: 0.6 mg/dL (ref 0.0–1.2)
CO2: 17 mmol/L — ABNORMAL LOW (ref 20–29)
Calcium: 9.7 mg/dL (ref 8.7–10.2)
Chloride: 106 mmol/L (ref 96–106)
Creatinine, Ser: 0.97 mg/dL (ref 0.57–1.00)
GFR calc Af Amer: 82 mL/min/{1.73_m2} (ref >59)
GFR calc non Af Amer: 71 mL/min/{1.73_m2} (ref >59)
Globulin, Total: 3.7 g/dL (ref 1.5–4.5)
Glucose: 96 mg/dL (ref 65–99)
Potassium: 4.4 mmol/L (ref 3.5–5.2)
Sodium: 141 mmol/L (ref 134–144)
Total Protein: 7.8 g/dL (ref 6.0–8.5)

## 2019-10-02 LAB — LIPID PANEL WITH LDL/HDL RATIO
Cholesterol, Total: 159 mg/dL (ref 100–199)
HDL: 41 mg/dL (ref >39)
LDL Chol Calc (NIH): 101 mg/dL — ABNORMAL HIGH (ref 0–99)
LDL/HDL Ratio: 2.5 ratio (ref 0.0–3.2)
Triglycerides: 91 mg/dL (ref 0–149)
VLDL Cholesterol Cal: 17 mg/dL (ref 5–40)

## 2019-10-02 LAB — HEMOGLOBIN A1C
Est. average glucose Bld gHb Est-mCnc: 97 mg/dL
Hgb A1c MFr Bld: 5 % (ref 4.8–5.6)

## 2019-10-02 LAB — INSULIN, RANDOM: INSULIN: 18 u[IU]/mL (ref 2.6–24.9)

## 2019-10-02 LAB — VITAMIN D 25 HYDROXY (VIT D DEFICIENCY, FRACTURES): Vit D, 25-Hydroxy: 86.6 ng/mL (ref 30.0–100.0)

## 2019-10-05 ENCOUNTER — Other Ambulatory Visit (INDEPENDENT_AMBULATORY_CARE_PROVIDER_SITE_OTHER): Payer: Self-pay | Admitting: Physician Assistant

## 2019-10-05 DIAGNOSIS — E559 Vitamin D deficiency, unspecified: Secondary | ICD-10-CM

## 2019-10-05 NOTE — Progress Notes (Signed)
Office: [phone]  /  Fax: [phone]   HPI:  Chief Complaint: OBESITY Stacey Huerta is here to discuss her progress with her obesity treatment plan. She is keeping a food journal of 1700 calories and she states she is following her eating plan approximately 95 % of the time. She states she is doing [employer] for 60 minutes 5 times per week.  Stacey Huerta states that she has been eating 1700 calories based on the recommendation from [facility], but that most days, she is only eating about 1600. She is getting in all of her protein.  Vitamin D deficiency Stacey Huerta has a diagnosis of vitamin D deficiency. She is on vitamin D. Stacey Huerta denies nausea, vomiting or muscle weakness.  Hyperlipidemia Stacey Huerta has hyperlipidemia and she is on Omega 3's. She has no chest pain.  Insulin Resistance Stacey Huerta has a diagnosis of insulin resistance and she is not on medications. Patient stopped metformin on her own, because she didn't feel well on it. She has no polyphagia.  Shortness of Breath with Exertion Stacey Huerta has shortness of breath with exertion and she has no dizziness or lightheadedness.    At risk for cardiovascular disease Stacey Huerta is at a higher than average risk for cardiovascular disease due to obesity, hyperlipidemia and insulin resistance. She currently denies any chest pain.   Today's visit was # 23 Starting weight: 290 lbs Starting date: 06/26/2018 Today's weight : 289 lbs Today's date: 10/01/2019 Total lbs lost to date: 1 Total lbs lost since last in-office visit: 0  ASSESSMENT AND PLAN:  Vitamin D deficiency - Plan: Vitamin D (25 hydroxy)  Other hyperlipidemia - Plan: Lipid Panel With LDL/HDL Ratio  Insulin resistance - Plan: Comprehensive Metabolic Panel (CMET), HgB A1c, Insulin, random  Shortness of breath on exertion  At risk for heart disease  Class 3 severe obesity with serious comorbidity and body mass index (BMI) of 40.0 to 44.9 in adult, unspecified obesity type  (HCC)  PLAN:  Vitamin D Deficiency Low vitamin D level contributes to fatigue and are associated with obesity, breast, and colon cancer. Stacey Huerta agrees to continue to take prescription Vit D @50 ,000 IU every week and she will follow up for routine testing of vitamin D, at least 2-3 times per year to avoid over-replacement. We will check labs and she will follow up as directed.  Hyperlipidemia Intensive lifestyle modifications as the first line treatment for hyperlipidemia. We discussed many lifestyle modifications today and Stacey Huerta will continue to work on diet, exercise and weight loss efforts. Stacey Huerta will continue with Omega 3 and we will check labs today.  Insulin Resistance Stacey Huerta will continue to work on weight loss, exercise, and decreasing simple carbohydrates to help decrease the risk of diabetes. Stacey Huerta agreed to follow up with [place] as directed to closely monitor her progress.  Shortness of Breath with exertion Stacey Huerta's shortness of breath appears to be obesity related and exercise induced. She has agreed to work on weight loss and gradually increase exercise to treat her exercise induced shortness of breath. Will continue to monitor closely.  Obesity Stacey Huerta is currently in the action stage of change. As such, her goal is to continue with weight loss efforts She has agreed to keep a food journal with 1800 calories and 115 grams of protein daily Stacey Huerta has been instructed to work up to a goal of 150 minutes of combined cardio and strengthening exercise per week for weight loss and overall health benefits. We discussed the following Behavioral Modification Strategies today: keeping healthy  foods in the home and work on meal planning and easy cooking plans Stacey Huerta will increase calories to 1800. She may use good fasts, such as olive oil or coconut oil, or a protein shake.  Stacey Huerta has agreed to follow up with our clinic in 2 weeks. She was informed of the importance of frequent  follow up visits to maximize her success with intensive lifestyle modifications for her multiple health conditions.  ALLERGIES: Allergies  Allergen Reactions  . Eggs Or Egg-Derived Products   . Metformin And Related     Possible lactic acidosis  . Sulfonamide Derivatives Hives    Also high fever  . Ganoderma Lucidum   . [place]   . Other     Mushrooms and [place] Nuts  . Penicillins     Childhood reaction  . Shellfish Allergy Nausea And Vomiting  . Fish-Derived Products Rash    MEDICATIONS: Current Outpatient Medications on File Prior to Visit  Medication Sig Dispense Refill  . aspirin EC 81 MG tablet Take 1 tablet (81 mg total) by mouth daily.    . B Complex-C-Folic Acid (B-COMPLEX BALANCED PO) Take by mouth.    . calcium carbonate 200 MG capsule Take 250 mg by mouth 2 (two) times daily with a meal.    . Cholecalciferol (VITAMIN D) 125 MCG (5000 UT) CAPS Take 1 capsule by mouth daily.    Marland Kitchen estradiol (ESTRACE) 2 MG tablet Take 2 mg by mouth daily.    Marland Kitchen letrozole (FEMARA) 2.5 MG tablet Take 5 mg by mouth daily.    Marland Kitchen liothyronine (CYTOMEL) 5 MCG tablet Take 10 mcg by mouth daily.    Marland Kitchen MAGNESIUM PO Take 1 tablet by mouth daily.    . Nutritional Supplements (DHEA PO) Take 1 tablet by mouth daily.    . Omega-3 Fatty Acids (FISH OIL) 1000 MG CAPS Take by mouth.    . pantoprazole (PROTONIX) 40 MG tablet TAKE 1 TABLET BY MOUTH TWICE A DAY 60 tablet 1  . Prenatal Vit-Fe Fumarate-FA (PRENATAL VITAMIN PO) Take 1 tablet by mouth daily.    . Probiotic Product (PROBIOTIC-10 PO) Take by mouth.    . progesterone (PROMETRIUM) 200 MG capsule Take 200 mg by mouth 2 (two) times daily.    Marland Kitchen pyridOXINE (VITAMIN B-6) 50 MG tablet Take 50 mg by mouth daily.    . SELENIUM PO Take 1 tablet by mouth daily.    . Vitamin D, Ergocalciferol, (DRISDOL) 1.25 MG (50000 UT) CAPS capsule Take 1 capsule (50,000 Units total) by mouth every 7 (seven) days. 4 capsule 0  . VITAMIN E PO Take 1 tablet by mouth daily.     Marland Kitchen zinc gluconate 50 MG tablet Take 50 mg by mouth daily.     Current Facility-Administered Medications on File Prior to Visit  Medication Dose Route Frequency Provider Last Rate Last Admin  . cyanocobalamin ((VITAMIN B-12)) injection 1,000 mcg  1,000 mcg Intramuscular Q30 days Stacey Huerta, Stacey Fries Huerta, Stacey Huerta   1,000 mcg at 06/05/19 1010    PAST MEDICAL HISTORY: Past Medical History:  Diagnosis Date  . Allergy   . Back pain   . Dyspnea   . Fibroid   . Headache(784.0)   . Hypertension during pregnancy   . Infertility, female   . Leg edema   . Osteoarthritis   . Postpartum depression   . Sjogren's syndrome ([place]) 10/28/2015  . Vitamin B 12 deficiency   . Vitamin D deficiency     PAST SURGICAL HISTORY: Past  Surgical History:  Procedure Laterality Date  . ABDOMINAL ADHESION SURGERY    . CESAREAN SECTION N/A 03/18/2014   Procedure: CESAREAN SECTION;  Surgeon: Delice Lesch, MD;  Location: [facility];  Service: Obstetrics;  Laterality: N/A;  spinal/epidural  . CHOLECYSTECTOMY    . ENDOMETRIAL ABLATION    . LYMPHADENECTOMY     Removed from the neck at age 40  . MYOMECTOMY    . MYOMECTOMY  05/23/2012   Procedure: MYOMECTOMY;  Surgeon: Governor Specking, MD;  Location: [facility];  Service: Gynecology;  Laterality: N/A;    SOCIAL HISTORY: Social History   Tobacco Use  . Smoking status: Never Smoker  . Smokeless tobacco: Never Used  Substance Use Topics  . Alcohol use: Yes    Alcohol/week: 0.0 standard drinks    Comment: socially  . Drug use: No    FAMILY HISTORY: Family History  Problem Relation Age of Onset  . Hypertension Paternal Grandfather   . Diabetes Paternal Grandfather   . Diabetes Father   . Hypotension Father   . Colon cancer Paternal Grandmother   . Diabetes Paternal Grandmother   . Prostate cancer Maternal Grandfather   . Hyperlipidemia Mother   . Obesity Mother   . Breast cancer Maternal Aunt   . Arthritis Paternal Aunt   . Hyperlipidemia Maternal Aunt         x's 2  . Esophageal cancer Neg Hx   . Stomach cancer Neg Hx     ROS: Review of Systems  Constitutional: Negative for weight loss.  Respiratory: Positive for shortness of breath (on exertion).   Cardiovascular: Negative for chest pain.  Gastrointestinal: Negative for nausea and vomiting.  Musculoskeletal:       Negative for muscle weakness  Neurological: Negative for dizziness.       Negative for lightheadedness  Endo/Heme/Allergies:       Negative for polyphagia    PHYSICAL EXAM: Blood pressure 106/69, pulse 94, temperature 98.1 F (36.7 C), temperature source Oral, height 5\' 10"  (1.778 m), weight 289 lb (131.1 kg), last menstrual period 09/01/2019, SpO2 98 %. Body mass index is 41.47 kg/m. Physical Exam Vitals reviewed.  Constitutional:      General: She is not in acute distress.    Appearance: Normal appearance. She is well-developed. She is obese.  Cardiovascular:     Rate and Rhythm: Normal rate.  Pulmonary:     Effort: Pulmonary effort is normal.  Musculoskeletal:        General: Normal range of motion.  Skin:    General: Skin is warm and dry.  Neurological:     Mental Status: She is alert and oriented to person, place, and time.  Psychiatric:        Mood and Affect: Mood normal.        Behavior: Behavior normal.     RECENT LABS AND TESTS: BMET    Component Value Date/Time   NA 141 10/01/2019 1301   K 4.4 10/01/2019 1301   CL 106 10/01/2019 1301   CO2 17 (L) 10/01/2019 1301   GLUCOSE 96 10/01/2019 1301   GLUCOSE 104 (H) 05/02/2018 1216   BUN 10 10/01/2019 1301   CREATININE 0.97 10/01/2019 1301   CREATININE 0.98 08/19/2014 1223   CALCIUM 9.7 10/01/2019 1301   GFRNONAA 71 10/01/2019 1301   GFRAA 82 10/01/2019 1301   Lab Results  Component Value Date   HGBA1C 5.0 10/01/2019   HGBA1C 5.0 03/05/2019   HGBA1C 4.9 10/07/2018   HGBA1C  5.0 06/26/2018   HGBA1C 5.2 12/19/2015   Lab Results  Component Value Date   INSULIN 18.0 10/01/2019   INSULIN  21.8 03/05/2019   INSULIN 10.5 10/07/2018   INSULIN 15.4 06/26/2018   CBC    Component Value Date/Time   WBC 4.4 06/26/2018 1130   WBC 4.1 05/02/2018 1216   RBC 4.38 06/26/2018 1130   RBC 4.23 05/02/2018 1216   HGB 13.6 06/26/2018 1130   HCT 41.1 06/26/2018 1130   PLT 182.0 05/02/2018 1216   MCV 94 06/26/2018 1130   MCH 31.1 06/26/2018 1130   MCH 30.0 08/19/2014 1226   MCH 32.4 03/20/2014 0812   MCHC 33.1 06/26/2018 1130   MCHC 33.6 05/02/2018 1216   RDW 12.0 (L) 06/26/2018 1130   LYMPHSABS 1.3 06/26/2018 1130   MONOABS 0.3 05/02/2018 1216   EOSABS 0.0 06/26/2018 1130   BASOSABS 0.0 06/26/2018 1130   Iron/TIBC/Ferritin/ %Sat No results found for: IRON, TIBC, FERRITIN, IRONPCTSAT Lipid Panel     Component Value Date/Time   CHOL 159 10/01/2019 1301   TRIG 91 10/01/2019 1301   HDL 41 10/01/2019 1301   CHOLHDL 4 12/19/2015 0852   VLDL 20.2 12/19/2015 0852   LDLCALC 101 (H) 10/01/2019 1301   Hepatic Function Panel     Component Value Date/Time   PROT 7.8 10/01/2019 1301   ALBUMIN 4.1 10/01/2019 1301   AST 20 10/01/2019 1301   ALT 14 10/01/2019 1301   ALKPHOS 90 10/01/2019 1301   BILITOT 0.6 10/01/2019 1301   BILIDIR 0.1 12/14/2014 1402   IBILI 0.9 04/30/2008 2117      Component Value Date/Time   TSH 2.420 06/26/2018 1130   TSH 1.59 05/02/2018 1216   TSH 1.34 12/14/2014 1402     Ref. Range 10/01/2019 13:01  Vitamin D, 25-Hydroxy Latest Ref Range: 30.0 - 100.0 ng/mL 86.6    I, Doreene Nest, am acting as Location manager for [employer], PA-C I, Abby Potash, PA-C have reviewed above note and agree with its content

## 2019-10-14 ENCOUNTER — Ambulatory Visit (INDEPENDENT_AMBULATORY_CARE_PROVIDER_SITE_OTHER): Payer: BC Managed Care – PPO | Admitting: Physician Assistant

## 2019-11-10 ENCOUNTER — Encounter: Payer: Self-pay | Admitting: Family Medicine

## 2019-11-10 NOTE — Telephone Encounter (Signed)
She could see hematology but we don't have the labs \ They will want the records

## 2019-11-13 ENCOUNTER — Other Ambulatory Visit: Payer: Self-pay | Admitting: Family Medicine

## 2019-11-13 DIAGNOSIS — R7989 Other specified abnormal findings of blood chemistry: Secondary | ICD-10-CM

## 2019-11-13 NOTE — Telephone Encounter (Signed)
I would like to repeat her labs here It is only slightly high Order is in

## 2019-12-07 ENCOUNTER — Other Ambulatory Visit: Payer: Self-pay

## 2019-12-08 ENCOUNTER — Ambulatory Visit: Payer: 59 | Admitting: Family Medicine

## 2019-12-08 ENCOUNTER — Encounter: Payer: Self-pay | Admitting: Family Medicine

## 2019-12-08 DIAGNOSIS — R7879 Finding of abnormal level of heavy metals in blood: Secondary | ICD-10-CM | POA: Diagnosis not present

## 2019-12-08 DIAGNOSIS — R7989 Other specified abnormal findings of blood chemistry: Secondary | ICD-10-CM | POA: Diagnosis not present

## 2019-12-08 DIAGNOSIS — R5383 Other fatigue: Secondary | ICD-10-CM

## 2019-12-08 DIAGNOSIS — E559 Vitamin D deficiency, unspecified: Secondary | ICD-10-CM

## 2019-12-08 HISTORY — DX: Other fatigue: R53.83

## 2019-12-08 LAB — COMPREHENSIVE METABOLIC PANEL
ALT: 9 U/L (ref 0–35)
AST: 13 U/L (ref 0–37)
Albumin: 3.7 g/dL (ref 3.5–5.2)
Alkaline Phosphatase: 62 U/L (ref 39–117)
BUN: 13 mg/dL (ref 6–23)
CO2: 28 mEq/L (ref 19–32)
Calcium: 9.1 mg/dL (ref 8.4–10.5)
Chloride: 103 mEq/L (ref 96–112)
Creatinine, Ser: 0.92 mg/dL (ref 0.40–1.20)
GFR: 79.64 mL/min (ref >60.00)
Glucose, Bld: 90 mg/dL (ref 70–99)
Potassium: 4.4 mEq/L (ref 3.5–5.1)
Sodium: 135 mEq/L (ref 135–145)
Total Bilirubin: 0.7 mg/dL (ref 0.2–1.2)
Total Protein: 7 g/dL (ref 6.0–8.3)

## 2019-12-08 LAB — CBC WITH DIFFERENTIAL/PLATELET
Basophils Absolute: 0 10*3/uL (ref 0.0–0.1)
Basophils Relative: 0.8 % (ref 0.0–3.0)
Eosinophils Absolute: 0 10*3/uL (ref 0.0–0.7)
Eosinophils Relative: 0.3 % (ref 0.0–5.0)
HCT: 36.4 % (ref 36.0–46.0)
Hemoglobin: 12.1 g/dL (ref 12.0–15.0)
Lymphocytes Relative: 21.6 % (ref 12.0–46.0)
Lymphs Abs: 1.3 10*3/uL (ref 0.7–4.0)
MCHC: 33.1 g/dL (ref 30.0–36.0)
MCV: 97 fl (ref 78.0–100.0)
Monocytes Absolute: 0.3 10*3/uL (ref 0.1–1.0)
Monocytes Relative: 5.5 % (ref 3.0–12.0)
Neutro Abs: 4.5 10*3/uL (ref 1.4–7.7)
Neutrophils Relative %: 71.8 % (ref 43.0–77.0)
Platelets: 177 10*3/uL (ref 150.0–400.0)
RBC: 3.75 Mil/uL — ABNORMAL LOW (ref 3.87–5.11)
RDW: 13 % (ref 11.5–15.5)
WBC: 6.2 10*3/uL (ref 4.0–10.5)

## 2019-12-08 LAB — IBC + FERRITIN
Ferritin: 158.6 ng/mL (ref 10.0–291.0)
Iron: 133 ug/dL (ref 42–145)
Saturation Ratios: 50.5 % — ABNORMAL HIGH (ref 20.0–50.0)
Transferrin: 188 mg/dL — ABNORMAL LOW (ref 212.0–360.0)

## 2019-12-08 LAB — HEMOGLOBIN A1C: Hgb A1c MFr Bld: 5.7 % (ref 4.6–6.5)

## 2019-12-08 LAB — VITAMIN B12: Vitamin B-12: 1047 pg/mL — ABNORMAL HIGH (ref 211–911)

## 2019-12-08 NOTE — Progress Notes (Signed)
Patient ID: Stacey Huerta, female    DOB: 01-May-1976  Age: 44 y.o. MRN: [id number]    Subjective:  Subjective  HPI Stacey Huerta presents for c/o fatigue, obesity and infertility   Pt has been frustrated that she can not lose weight.  She has been to [facility] and [facility].    She is also trying to conceive and is seeing a fertility specialist in [place]   She is struggling with extreme fatigue and was told she had high copper levels and ferritin in her blood ---- tests done by [facility] She was told to f/u with her pcp  Review of Systems  Constitutional: Positive for fatigue and unexpected weight change. Negative for appetite change and diaphoresis.  Eyes: Negative for pain, redness and visual disturbance.  Respiratory: Negative for cough, chest tightness, shortness of breath and wheezing.   Cardiovascular: Negative for chest pain, palpitations and leg swelling.  Endocrine: Negative for cold intolerance, heat intolerance, polydipsia, polyphagia and polyuria.  Genitourinary: Negative for difficulty urinating, dysuria and frequency.  Neurological: Negative for dizziness, light-headedness, numbness and headaches.    History Past Medical History:  Diagnosis Date  . Allergy   . Back pain   . Dyspnea   . Fibroid   . Headache(784.0)   . Hypertension during pregnancy   . Infertility, female   . Leg edema   . Osteoarthritis   . Postpartum depression   . Sjogren's syndrome ([place]) 10/28/2015  . Vitamin B 12 deficiency   . Vitamin D deficiency     She has a past surgical history that includes Cholecystectomy; Myomectomy; Myomectomy (05/23/2012); Lymphadenectomy; Abdominal adhesion surgery; Endometrial ablation; and Cesarean section (N/A, 03/18/2014).   Her family history includes Arthritis in her paternal aunt; Breast cancer in her maternal aunt; Colon cancer in her paternal grandmother; Diabetes in her father, paternal grandfather, and paternal grandmother;  Hyperlipidemia in her maternal aunt and mother; Hypertension in her paternal grandfather; Hypotension in her father; Obesity in her mother; Prostate cancer in her maternal grandfather.She reports that she has never smoked. She has never used smokeless tobacco. She reports current alcohol use. She reports that she does not use drugs.  Current Outpatient Medications on File Prior to Visit  Medication Sig Dispense Refill  . aspirin EC 81 MG tablet Take 1 tablet (81 mg total) by mouth daily.    . B Complex-C-Folic Acid (B-COMPLEX BALANCED PO) Take by mouth.    . calcium carbonate 200 MG capsule Take 250 mg by mouth 2 (two) times daily with a meal.    . Cholecalciferol (VITAMIN D) 125 MCG (5000 UT) CAPS Take 1 capsule by mouth daily.    Marland Kitchen dexamethasone (DECADRON) 0.5 MG tablet Take 0.5 mg by mouth 2 (two) times daily.    Marland Kitchen estradiol (ESTRACE) 2 MG tablet Take 2 mg by mouth daily.    Marland Kitchen letrozole (FEMARA) 2.5 MG tablet Take 5 mg by mouth daily.    Marland Kitchen liothyronine (CYTOMEL) 5 MCG tablet Take 10 mcg by mouth daily.    Marland Kitchen MAGNESIUM PO Take 1 tablet by mouth daily.    . Nutritional Supplements (DHEA PO) Take 1 tablet by mouth daily.    . Omega-3 Fatty Acids (FISH OIL) 1000 MG CAPS Take by mouth.    . OMNITROPE 5.8 MG SOLR MIX WITH DILUENT AND INJECT 0.3ML (30 UNITS) UNDER THE SKIN EVERY DAY FOR 3 DAYS USE 2ND VIAL IN SAME FASHION    . OVIDREL 250 MCG/0.5ML  injection INJECT 250MCG SYRINGE SUB-Q WHEN DIRECTED. KEEP REFRIGERATED.    Marland Kitchen pantoprazole (PROTONIX) 40 MG tablet TAKE 1 TABLET BY MOUTH TWICE A DAY 60 tablet 1  . Prenatal Vit-Fe Fumarate-FA (PRENATAL VITAMIN PO) Take 1 tablet by mouth daily.    . Probiotic Product (PROBIOTIC-10 PO) Take by mouth.    . progesterone (PROMETRIUM) 200 MG capsule Take 200 mg by mouth 2 (two) times daily.    Marland Kitchen pyridOXINE (VITAMIN B-6) 50 MG tablet Take 50 mg by mouth daily.    . SELENIUM PO Take 1 tablet by mouth daily.    . Vitamin D, Ergocalciferol, (DRISDOL) 1.25 MG  (50000 UT) CAPS capsule Take 1 capsule (50,000 Units total) by mouth every 7 (seven) days. 4 capsule 0  . VITAMIN E PO Take 1 tablet by mouth daily.    Marland Kitchen zinc gluconate 50 MG tablet Take 50 mg by mouth daily.     Current Facility-Administered Medications on File Prior to Visit  Medication Dose Route Frequency Provider Last Rate Last Admin  . cyanocobalamin ((VITAMIN B-12)) injection 1,000 mcg  1,000 mcg Intramuscular Q30 days Carollee Herter, Kendrick Fries R, DO   1,000 mcg at 06/05/19 1010     Objective:  Objective  Physical Exam Vitals and nursing note reviewed.  Constitutional:      Appearance: She is well-developed.  HENT:     Head: Normocephalic and atraumatic.     Nose: Rhinorrhea: copper.  Eyes:     Conjunctiva/sclera: Conjunctivae normal.  Neck:     Thyroid: No thyromegaly.     Vascular: No carotid bruit or JVD.  Cardiovascular:     Rate and Rhythm: Normal rate and regular rhythm.     Heart sounds: Normal heart sounds. No murmur.  Pulmonary:     Effort: Pulmonary effort is normal. No respiratory distress.     Breath sounds: Normal breath sounds. No wheezing or rales.  Chest:     Chest wall: No tenderness.  Musculoskeletal:     Cervical back: Normal range of motion and neck supple.  Neurological:     Mental Status: She is alert and oriented to person, place, and time.    BP 98/70 (BP Location: Right Arm, Patient Position: Sitting, Cuff Size: Large)   Pulse 92   Temp (!) 97.4 F (36.3 C) (Temporal)   Resp 18   Ht 5\' 10"  (1.778 m)   Wt 296 lb 12.8 oz (134.6 kg)   SpO2 95%   BMI 42.59 kg/m  Wt Readings from Last 3 Encounters:  12/08/19 296 lb 12.8 oz (134.6 kg)  10/01/19 289 lb (131.1 kg)  09/11/19 291 lb (132 kg)     Lab Results  Component Value Date   WBC 4.4 06/26/2018   HGB 13.6 06/26/2018   HCT 41.1 06/26/2018   PLT 182.0 05/02/2018   GLUCOSE 96 10/01/2019   CHOL 159 10/01/2019   TRIG 91 10/01/2019   HDL 41 10/01/2019   LDLCALC 101 (H) 10/01/2019   ALT  14 10/01/2019   AST 20 10/01/2019   NA 141 10/01/2019   K 4.4 10/01/2019   CL 106 10/01/2019   CREATININE 0.97 10/01/2019   BUN 10 10/01/2019   CO2 17 (L) 10/01/2019   TSH 2.420 06/26/2018   HGBA1C 5.0 10/01/2019   MICROALBUR 5.0 (H) 12/19/2015    CT ABDOMEN PELVIS W CONTRAST  Result Date: 05/30/2019 CLINICAL DATA:  44 year old female with bloating and lower abdominal pain. EXAM: CT ABDOMEN AND PELVIS WITH CONTRAST TECHNIQUE: Multidetector  CT imaging of the abdomen and pelvis was performed using the standard protocol following bolus administration of intravenous contrast. CONTRAST:  12mL OMNIPAQUE IOHEXOL 300 MG/ML  SOLN COMPARISON:  CT of the abdomen pelvis dated 01/02/2016 FINDINGS: Lower chest: The visualized lung bases are clear. No intra-abdominal free air or free fluid. Hepatobiliary: Probable mild fatty infiltration of the liver. No intrahepatic biliary ductal dilatation. Cholecystectomy. Pancreas: Unremarkable. No pancreatic ductal dilatation or surrounding inflammatory changes. Spleen: Normal in size without focal abnormality. Adrenals/Urinary Tract: Adrenal glands are unremarkable. There is no hydronephrosis on either side. There is symmetric enhancement and excretion of contrast by both kidneys. The urinary bladder is collapsed. Stomach/Bowel: There is diffuse colonic diverticulosis without active inflammatory changes. There is no bowel obstruction or active inflammation. The appendix is normal. Vascular/Lymphatic: The abdominal aorta and IVC appear unremarkable. No portal venous gas. There is no adenopathy. Reproductive: The uterus appears retroverted and anteflexed. There is a 15 mm right ovarian dominant follicle or cyst. The left ovary is grossly unremarkable as visualized. Other: None Musculoskeletal: Degenerative changes of the lower lumbar spine. No acute osseous pathology. IMPRESSION: 1. No acute intra-abdominal or pelvic pathology. 2. Diffuse colonic diverticulosis. No bowel  obstruction or active inflammation. Normal appendix. 3. A 15 mm right ovarian dominant follicle or cyst. Electronically Signed   By: Anner Crete M.D.   On: 05/30/2019 02:52     Assessment & Plan:  Plan  I am having Keilynn A. Hatton maintain her B Complex-C-Folic Acid (B-COMPLEX BALANCED PO), calcium carbonate, Fish Oil, zinc gluconate, aspirin EC, letrozole, Prenatal Vit-Fe Fumarate-FA (PRENATAL VITAMIN PO), SELENIUM PO, MAGNESIUM PO, Nutritional Supplements (DHEA PO), VITAMIN E PO, pyridOXINE, estradiol, Probiotic Product (PROBIOTIC-10 PO), progesterone, Vitamin D, pantoprazole, liothyronine, Vitamin D (Ergocalciferol), Ovidrel, dexamethasone, and Omnitrope. We will continue to administer cyanocobalamin.  No orders of the defined types were placed in this encounter.   Problem List Items Addressed This Visit      Unprioritized   Morbid obesity ([place]) - Primary    Not successful with healthy weight and wellness , or [facility]----  Seeing fertility specialist in [place] labs  Consider endocrine or reproductive endo       Relevant Orders   VITAMIN D 25 Hydroxy (Vit-D Deficiency, Fractures)   Insulin, random   Thyroid Panel With TSH   Thyroid peroxidase antibody   CBC with Differential/Platelet   IBC + Ferritin   Comprehensive metabolic panel   Hemoglobin A1c   Other fatigue    Check labs       Relevant Orders   Vitamin B12   Thyroid Panel With TSH   Thyroid peroxidase antibody   CBC with Differential/Platelet   IBC + Ferritin   Comprehensive metabolic panel   Hemoglobin A1c   Vitamin D deficiency    Check labs        Other Visit Diagnoses    High blood copper level       Relevant Orders   Ceruloplasmin   High serum ferritin       Relevant Orders   CBC with Differential/Platelet   IBC + Ferritin      Follow-up: Return if symptoms worsen or fail to improve.  Ann Held, DO

## 2019-12-08 NOTE — Assessment & Plan Note (Signed)
Check labs 

## 2019-12-08 NOTE — Assessment & Plan Note (Signed)
Not successful with healthy weight and wellness , or [facility]----  Seeing fertility specialist in [place] labs  Consider endocrine or reproductive endo

## 2019-12-08 NOTE — Patient Instructions (Signed)

## 2019-12-09 ENCOUNTER — Encounter (INDEPENDENT_AMBULATORY_CARE_PROVIDER_SITE_OTHER): Payer: Self-pay | Admitting: Physician Assistant

## 2019-12-09 ENCOUNTER — Other Ambulatory Visit: Payer: Self-pay

## 2019-12-09 ENCOUNTER — Telehealth: Payer: Self-pay | Admitting: Family Medicine

## 2019-12-09 ENCOUNTER — Ambulatory Visit (INDEPENDENT_AMBULATORY_CARE_PROVIDER_SITE_OTHER): Payer: 59 | Admitting: Physician Assistant

## 2019-12-09 ENCOUNTER — Other Ambulatory Visit: Payer: Self-pay | Admitting: Family Medicine

## 2019-12-09 VITALS — BP 106/72 | HR 83 | Temp 98.4°F | Ht 70.0 in | Wt 296.0 lb

## 2019-12-09 DIAGNOSIS — E7849 Other hyperlipidemia: Secondary | ICD-10-CM

## 2019-12-09 DIAGNOSIS — E559 Vitamin D deficiency, unspecified: Secondary | ICD-10-CM | POA: Diagnosis not present

## 2019-12-09 DIAGNOSIS — Z6841 Body Mass Index (BMI) 40.0 and over, adult: Secondary | ICD-10-CM

## 2019-12-09 DIAGNOSIS — Z9189 Other specified personal risk factors, not elsewhere classified: Secondary | ICD-10-CM | POA: Diagnosis not present

## 2019-12-09 LAB — THYROID PANEL WITH TSH
Free Thyroxine Index: 1.8 (ref 1.4–3.8)
T3 Uptake: 33 % (ref 22–35)
T4, Total: 5.6 ug/dL (ref 5.1–11.9)
TSH: 1.94 mIU/L

## 2019-12-09 LAB — CERULOPLASMIN: Ceruloplasmin: 44 mg/dL (ref 18–53)

## 2019-12-09 LAB — THYROID PEROXIDASE ANTIBODY: Thyroperoxidase Ab SerPl-aCnc: <1 IU/mL (ref <9)

## 2019-12-09 LAB — INSULIN, RANDOM: Insulin: 16.6 u[IU]/mL

## 2019-12-09 MED ORDER — VITAMIN D (ERGOCALCIFEROL) 1.25 MG (50000 UNIT) PO CAPS: 50000.0000 [IU] | ORAL_CAPSULE | ORAL | 0 refills | Status: DC

## 2019-12-09 NOTE — Progress Notes (Signed)
Chief Complaint:   OBESITY Stacey Huerta is here to discuss her progress with her obesity treatment plan along with follow-up of her obesity related diagnoses. Stacey Huerta is on keeping a food journal and adhering to recommended goals of 1800 calories and 115 grams of protein and states she is following her eating plan approximately 90% of the time. Stacey Huerta states she is doing [employer] for 60 minutes 4-5 times per week.  Today's visit was #: 24 Starting weight: 290 lbs Starting date: 06/26/2018 Today's weight: 296 lbs Today's date: 12/09/2019 Total lbs lost to date: 0 Total lbs lost since last in-office visit: 0  Interim History: Stacey Huerta reports that she is averaging 85-95 grams of protein daily.  She continues to stay around 1700-1800 calories.  Subjective:   1. Vitamin D deficiency Stacey Huerta's Vitamin D level was 86.6 on 10/01/2019. She is currently taking vit D. She denies nausea, vomiting or muscle weakness.  She reports that her infertility specialist wants her vitamin D level around 100.  2. Other hyperlipidemia Stacey Huerta has hyperlipidemia and has been trying to improve her cholesterol levels with intensive lifestyle modification including a low saturated fat diet, exercise and weight loss. She denies any chest pain, claudication or myalgias. She is taking Omega 3.  Exercising regularly.  HDL levels worsening, despite exercise.  Lab Results  Component Value Date   ALT 9 12/08/2019   AST 13 12/08/2019   ALKPHOS 62 12/08/2019   BILITOT 0.7 12/08/2019   Lab Results  Component Value Date   CHOL 159 10/01/2019   HDL 41 10/01/2019   LDLCALC 101 (H) 10/01/2019   TRIG 91 10/01/2019   CHOLHDL 4 12/19/2015   3. At risk for osteoporosis Stacey Huerta is at higher risk of osteopenia and osteoporosis due to Vitamin D deficiency.   Assessment/Plan:   1. Vitamin D deficiency Low Vitamin D level contributes to fatigue and are associated with obesity, breast, and colon cancer. She agrees to  continue to take prescription Vitamin D @50 ,000 IU every week and will follow-up for routine testing of Vitamin D, at least 2-3 times per year to avoid over-replacement. - Vitamin D, Ergocalciferol, (DRISDOL) 1.25 MG (50000 UNIT) CAPS capsule; Take 1 capsule (50,000 Units total) by mouth every 7 (seven) days.  Dispense: 4 capsule; Refill: 0  2. Other hyperlipidemia Cardiovascular risk and specific lipid/LDL goals reviewed.  We discussed several lifestyle modifications today and Stacey Huerta will continue to work on diet, exercise and weight loss efforts. Orders and follow up as documented in patient record.   Counseling Intensive lifestyle modifications are the first line treatment for this issue. . Dietary changes: Increase soluble fiber. Decrease simple carbohydrates. . Exercise changes: Moderate to vigorous-intensity aerobic activity 150 minutes per week if tolerated. . Lipid-lowering medications: see documented in medical record.  3. At risk for osteoporosis Stacey Huerta was given approximately 15 minutes of osteoporosis prevention counseling today. Stacey Huerta is at risk for osteopenia and osteoporosis due to her Vitamin D deficiency. She was encouraged to take her Vitamin D and follow her higher calcium diet and increase strengthening exercise to help strengthen her bones and decrease her risk of osteopenia and osteoporosis.  Repetitive spaced learning was employed today to elicit superior memory formation and behavioral change.  4. Class 3 severe obesity with serious comorbidity and body mass index (BMI) of 40.0 to 44.9 in adult, unspecified obesity type (HCC) Stacey Huerta is currently in the action stage of change. As such, her goal is to continue with weight loss efforts.  She has agreed to keeping a food journal and adhering to recommended goals of 1800 calories and 115 grams of protein daily.  Exercise goals: For substantial health benefits, adults should do at least 150 minutes (2 hours and 30 minutes) a  week of moderate-intensity, or 75 minutes (1 hour and 15 minutes) a week of vigorous-intensity aerobic physical activity, or an equivalent combination of moderate- and vigorous-intensity aerobic activity. Aerobic activity should be performed in episodes of at least 10 minutes, and preferably, it should be spread throughout the week.  Behavioral modification strategies: meal planning and cooking strategies and keeping healthy foods in the home.  Stacey Huerta has agreed to follow-up with our clinic in 2 weeks. She was informed of the importance of frequent follow-up visits to maximize her success with intensive lifestyle modifications for her multiple health conditions.   Objective:   Blood pressure 106/72, pulse 83, temperature 98.4 F (36.9 C), temperature source Oral, height 5\' 10"  (1.778 m), weight 296 lb (134.3 kg), last menstrual period 12/02/2019, SpO2 98 %. Body mass index is 42.47 kg/m.  General: Cooperative, alert, well developed, in no acute distress. HEENT: Conjunctivae and lids unremarkable. Cardiovascular: Regular rhythm.  Lungs: Normal work of breathing. Neurologic: No focal deficits.   Lab Results  Component Value Date   CREATININE 0.92 12/08/2019   BUN 13 12/08/2019   NA 135 12/08/2019   K 4.4 12/08/2019   CL 103 12/08/2019   CO2 28 12/08/2019   Lab Results  Component Value Date   ALT 9 12/08/2019   AST 13 12/08/2019   ALKPHOS 62 12/08/2019   BILITOT 0.7 12/08/2019   Lab Results  Component Value Date   HGBA1C 5.7 12/08/2019   HGBA1C 5.0 10/01/2019   HGBA1C 5.0 03/05/2019   HGBA1C 4.9 10/07/2018   HGBA1C 5.0 06/26/2018   Lab Results  Component Value Date   INSULIN 18.0 10/01/2019   INSULIN 21.8 03/05/2019   INSULIN 10.5 10/07/2018   INSULIN 15.4 06/26/2018   Lab Results  Component Value Date   TSH 1.94 12/08/2019   Lab Results  Component Value Date   CHOL 159 10/01/2019   HDL 41 10/01/2019   LDLCALC 101 (H) 10/01/2019   TRIG 91 10/01/2019   CHOLHDL  4 12/19/2015   Lab Results  Component Value Date   WBC 6.2 12/08/2019   HGB 12.1 12/08/2019   HCT 36.4 12/08/2019   MCV 97.0 12/08/2019   PLT 177.0 12/08/2019   Lab Results  Component Value Date   IRON 133 12/08/2019   FERRITIN 158.6 12/08/2019   Attestation Statements:   Reviewed by clinician on day of visit: allergies, medications, problem list, medical history, surgical history, family history, social history, and previous encounter notes.  I, Water quality scientist, CMA, am acting as Location manager for [employer], PA-C.  I have reviewed the above documentation for accuracy and completeness, and I agree with the above. Abby Potash, PA-C

## 2019-12-09 NOTE — Telephone Encounter (Signed)
Pt states she was returning a call for lab resuts

## 2019-12-11 NOTE — Telephone Encounter (Signed)
VM left - with results

## 2019-12-14 ENCOUNTER — Telehealth: Payer: Self-pay

## 2019-12-14 NOTE — Telephone Encounter (Signed)
Patient called in to see if the nurse or Dr. Etter Sjogren could give her a call and go over her test results.   Thanks,

## 2019-12-15 NOTE — Telephone Encounter (Signed)
Called patient left voicemail for patient to call the office back

## 2019-12-16 ENCOUNTER — Ambulatory Visit (INDEPENDENT_AMBULATORY_CARE_PROVIDER_SITE_OTHER): Payer: 59 | Admitting: Family Medicine

## 2019-12-16 ENCOUNTER — Other Ambulatory Visit: Payer: Self-pay

## 2019-12-16 ENCOUNTER — Encounter: Payer: Self-pay | Admitting: Family Medicine

## 2019-12-16 DIAGNOSIS — E559 Vitamin D deficiency, unspecified: Secondary | ICD-10-CM | POA: Diagnosis not present

## 2019-12-16 DIAGNOSIS — E8881 Metabolic syndrome: Secondary | ICD-10-CM

## 2019-12-16 NOTE — Assessment & Plan Note (Signed)
Result pending

## 2019-12-16 NOTE — Assessment & Plan Note (Signed)
Pt started back at healthy weight and wellness

## 2019-12-16 NOTE — Progress Notes (Signed)
Virtual Visit via Video Note  I connected with Stacey Huerta on 12/16/19 at 10:30 AM EST by a video enabled telemedicine application and verified that I am speaking with the correct person using two identifiers.  Location: Patient: work  Provider: home    I discussed the limitations of evaluation and management by telemedicine and the availability of in person appointments. The patient expressed understanding and agreed to proceed.  History of Present Illness: Pt is home --  She wishes to discuss her labs  Pt is back at healthy weight and wellness She has an app with her infertility specialist next week    Observations/Objective: Vitals:   12/16/19 1022  BP: 107/65  Pulse: 71  pt is in nad    Assessment and Plan: 1. Vitamin D deficiency Lab pending   2. Morbid obesity ([place]) Pt is back at healthy weight and wellness   3. Insulin resistance  Pt would benefit from metformin but she said it did not work and she is working with a infertility specialist to get pregnant  Lab Results  Component Value Date   HGBA1C 5.7 12/08/2019     Follow Up Instructions:    I discussed the assessment and treatment plan with the patient. The patient was provided an opportunity to ask questions and all were answered. The patient agreed with the plan and demonstrated an understanding of the instructions.   The patient was advised to call back or seek an in-person evaluation if the symptoms worsen or if the condition fails to improve as anticipated.  I provided 25 minutes of non-face-to-face time during this encounter.   Ann Held, DO

## 2019-12-16 NOTE — Assessment & Plan Note (Addendum)
Pt would benefit from metformin but she said it did not work and she is working with a infertility specialist to get pregnant  Lab Results  Component Value Date   HGBA1C 5.7 12/08/2019

## 2019-12-17 ENCOUNTER — Other Ambulatory Visit: Payer: Self-pay

## 2019-12-17 ENCOUNTER — Other Ambulatory Visit (INDEPENDENT_AMBULATORY_CARE_PROVIDER_SITE_OTHER): Payer: 59

## 2019-12-17 DIAGNOSIS — R7989 Other specified abnormal findings of blood chemistry: Secondary | ICD-10-CM | POA: Diagnosis not present

## 2019-12-18 ENCOUNTER — Telehealth: Payer: Self-pay | Admitting: *Deleted

## 2019-12-18 LAB — CBC WITH DIFFERENTIAL/PLATELET
Basophils Absolute: 0 10*3/uL (ref 0.0–0.1)
Basophils Relative: 0.5 % (ref 0.0–3.0)
Eosinophils Absolute: 0 10*3/uL (ref 0.0–0.7)
Eosinophils Relative: 0.2 % (ref 0.0–5.0)
HCT: 38.9 % (ref 36.0–46.0)
Hemoglobin: 13 g/dL (ref 12.0–15.0)
Lymphocytes Relative: 14.3 % (ref 12.0–46.0)
Lymphs Abs: 1.2 10*3/uL (ref 0.7–4.0)
MCHC: 33.3 g/dL (ref 30.0–36.0)
MCV: 98.4 fl (ref 78.0–100.0)
Monocytes Absolute: 0.6 10*3/uL (ref 0.1–1.0)
Monocytes Relative: 6.8 % (ref 3.0–12.0)
Neutro Abs: 6.4 10*3/uL (ref 1.4–7.7)
Neutrophils Relative %: 78.2 % — ABNORMAL HIGH (ref 43.0–77.0)
Platelets: 186 10*3/uL (ref 150.0–400.0)
RBC: 3.95 Mil/uL (ref 3.87–5.11)
RDW: 13.7 % (ref 11.5–15.5)
WBC: 8.2 10*3/uL (ref 4.0–10.5)

## 2019-12-18 LAB — IBC + FERRITIN
Ferritin: 127.2 ng/mL (ref 10.0–291.0)
Iron: 55 ug/dL (ref 42–145)
Saturation Ratios: 18.3 % — ABNORMAL LOW (ref 20.0–50.0)
Transferrin: 215 mg/dL (ref 212.0–360.0)

## 2019-12-18 LAB — VITAMIN D 25 HYDROXY (VIT D DEFICIENCY, FRACTURES): VITD: 102.78 ng/mL (ref 30.00–100.00)

## 2019-12-18 NOTE — Telephone Encounter (Signed)
CRITICAL VALUE STICKER  CRITICAL VALUE:  Vitamin D  102.78  RECEIVER (on-site recipient of call):  Stacey Huerta, [place] NOTIFIED: 12/18/19 @ 1:07  MESSENGER (representative from lab): Hope  MD NOTIFIED:  Larose Kells (Doc of Day) / Carollee Herter PCP  TIME OF NOTIFICATION:  RESPONSE:

## 2019-12-18 NOTE — Telephone Encounter (Signed)
Okay to wait for PCP

## 2019-12-21 NOTE — Telephone Encounter (Signed)
VM left with results and advised to recheck Vit D at healthy weight and wellness

## 2019-12-21 NOTE — Telephone Encounter (Signed)
Hold vita d for now -----recheck 6-8 weeks --- she has started back with healthy weight and wellness  They can recheck with their labs

## 2019-12-22 ENCOUNTER — Other Ambulatory Visit: Payer: 59

## 2019-12-28 ENCOUNTER — Ambulatory Visit (INDEPENDENT_AMBULATORY_CARE_PROVIDER_SITE_OTHER): Payer: 59 | Admitting: Physician Assistant

## 2019-12-28 ENCOUNTER — Other Ambulatory Visit: Payer: Self-pay

## 2019-12-28 ENCOUNTER — Encounter (INDEPENDENT_AMBULATORY_CARE_PROVIDER_SITE_OTHER): Payer: Self-pay | Admitting: Physician Assistant

## 2019-12-28 VITALS — BP 121/76 | HR 89 | Temp 98.2°F | Ht 70.0 in | Wt 290.0 lb

## 2019-12-28 DIAGNOSIS — Z6841 Body Mass Index (BMI) 40.0 and over, adult: Secondary | ICD-10-CM

## 2019-12-28 DIAGNOSIS — E7849 Other hyperlipidemia: Secondary | ICD-10-CM | POA: Diagnosis not present

## 2019-12-28 NOTE — Progress Notes (Signed)
Chief Complaint:   OBESITY Stacey Huerta is here to discuss her progress with her obesity treatment plan along with follow-up of her obesity related diagnoses. [name] is on keeping a food journal and adhering to recommended goals of 1800 calories and 115 grams of protein and states she is following her eating plan approximately 100% of the time. [name] states she is doing [employer] for 60 minutes 5 times per week.  Today's visit was #: 25 Starting weight: 290 lbs Starting date: 06/26/2018 Today's weight: 290 lbs Today's date: 12/28/2019 Total lbs lost to date: 0 Total lbs lost since last in-office visit: 6 lbs  Interim History: Stacey Huerta reports that she reached 300 pounds at one point in the last 2 weeks and decreased her calories to 1200 calories on non-workout days and 1400 calories on workout days.  Subjective:   1. Other hyperlipidemia Amalie has hyperlipidemia and has been trying to improve her cholesterol levels with intensive lifestyle modification including a low saturated fat diet, exercise and weight loss. She denies any chest pain, claudication or myalgias.  Last LDL at goal.  She is not on medicationsl  Lab Results  Component Value Date   ALT 9 12/08/2019   AST 13 12/08/2019   ALKPHOS 62 12/08/2019   BILITOT 0.7 12/08/2019   Lab Results  Component Value Date   CHOL 159 10/01/2019   HDL 41 10/01/2019   LDLCALC 101 (H) 10/01/2019   TRIG 91 10/01/2019   CHOLHDL 4 12/19/2015   Assessment/Plan:   1. Other hyperlipidemia Cardiovascular risk and specific lipid/LDL goals reviewed.  We discussed several lifestyle modifications today and Stacey Huerta will continue to work on diet, exercise and weight loss efforts. Orders and follow up as documented in patient record.   Counseling Intensive lifestyle modifications are the first line treatment for this issue. . Dietary changes: Increase soluble fiber. Decrease simple carbohydrates. . Exercise changes: Moderate to  vigorous-intensity aerobic activity 150 minutes per week if tolerated. . Lipid-lowering medications: see documented in medical record.  2. Class 3 severe obesity with serious comorbidity and body mass index (BMI) of 40.0 to 44.9 in adult, unspecified obesity type (HCC) Stacey Huerta is currently in the action stage of change. As such, her goal is to continue with weight loss efforts. She has agreed to keeping a food journal and adhering to recommended goals of 1200-1400 calories and 95 grams of protein.   Exercise goals: As is.  Behavioral modification strategies: meal planning and cooking strategies and planning for success.  Stacey Huerta has agreed to follow-up with our clinic in 2 weeks. She was informed of the importance of frequent follow-up visits to maximize her success with intensive lifestyle modifications for her multiple health conditions.   Objective:   Blood pressure 121/76, pulse 89, temperature 98.2 F (36.8 C), temperature source Oral, height 5\' 10"  (1.778 m), weight 290 lb (131.5 kg), last menstrual period 12/02/2019, SpO2 98 %. Body mass index is 41.61 kg/m.  General: Cooperative, alert, well developed, in no acute distress. HEENT: Conjunctivae and lids unremarkable. Cardiovascular: Regular rhythm.  Lungs: Normal work of breathing. Neurologic: No focal deficits.   Lab Results  Component Value Date   CREATININE 0.92 12/08/2019   BUN 13 12/08/2019   NA 135 12/08/2019   K 4.4 12/08/2019   CL 103 12/08/2019   CO2 28 12/08/2019   Lab Results  Component Value Date   ALT 9 12/08/2019   AST 13 12/08/2019   ALKPHOS 62 12/08/2019   BILITOT 0.7  12/08/2019   Lab Results  Component Value Date   HGBA1C 5.7 12/08/2019   HGBA1C 5.0 10/01/2019   HGBA1C 5.0 03/05/2019   HGBA1C 4.9 10/07/2018   HGBA1C 5.0 06/26/2018   Lab Results  Component Value Date   INSULIN 18.0 10/01/2019   INSULIN 21.8 03/05/2019   INSULIN 10.5 10/07/2018   INSULIN 15.4 06/26/2018   Lab Results    Component Value Date   TSH 1.94 12/08/2019   Lab Results  Component Value Date   CHOL 159 10/01/2019   HDL 41 10/01/2019   LDLCALC 101 (H) 10/01/2019   TRIG 91 10/01/2019   CHOLHDL 4 12/19/2015   Lab Results  Component Value Date   WBC 8.2 12/17/2019   HGB 13.0 12/17/2019   HCT 38.9 12/17/2019   MCV 98.4 12/17/2019   PLT 186.0 12/17/2019   Lab Results  Component Value Date   IRON 55 12/17/2019   FERRITIN 127.2 12/17/2019   Attestation Statements:   Reviewed by clinician on day of visit: allergies, medications, problem list, medical history, surgical history, family history, social history, and previous encounter notes.  Time spent on visit including pre-visit chart review and post-visit care and charting was 25 minutes.   I, Water quality scientist, CMA, am acting as Location manager for [employer], PA-C.  I have reviewed the above documentation for accuracy and completeness, and I agree with the above. Abby Potash, PA-C

## 2020-01-04 ENCOUNTER — Other Ambulatory Visit (INDEPENDENT_AMBULATORY_CARE_PROVIDER_SITE_OTHER): Payer: Self-pay | Admitting: Physician Assistant

## 2020-01-04 DIAGNOSIS — E559 Vitamin D deficiency, unspecified: Secondary | ICD-10-CM

## 2020-01-12 ENCOUNTER — Encounter (INDEPENDENT_AMBULATORY_CARE_PROVIDER_SITE_OTHER): Payer: Self-pay | Admitting: Physician Assistant

## 2020-01-12 ENCOUNTER — Ambulatory Visit (INDEPENDENT_AMBULATORY_CARE_PROVIDER_SITE_OTHER): Payer: 59 | Admitting: Physician Assistant

## 2020-01-12 ENCOUNTER — Other Ambulatory Visit: Payer: Self-pay

## 2020-01-12 VITALS — BP 108/76 | HR 83 | Temp 98.4°F | Ht 70.0 in | Wt 290.0 lb

## 2020-01-12 DIAGNOSIS — Z6841 Body Mass Index (BMI) 40.0 and over, adult: Secondary | ICD-10-CM | POA: Diagnosis not present

## 2020-01-12 DIAGNOSIS — R7303 Prediabetes: Secondary | ICD-10-CM

## 2020-01-12 NOTE — Progress Notes (Signed)
Chief Complaint:   OBESITY Stacey Huerta is here to discuss her progress with her obesity treatment plan along with follow-up of her obesity related diagnoses. [name] is on keeping a food journal and adhering to recommended goals of 1200-1400 calories and 95 grams of protein and states she is following her eating plan approximately 75% of the time. [name] states she is doing [employer] for 60 minutes 4 times per week.  Today's visit was #: 13 Starting weight: 290 lbs Starting date: 06/26/2018 Today's weight: 290 lbs Today's date: 01/12/2020 Total lbs lost to date: 0 Total lbs lost since last in-office visit: 0  Interim History: [name] reports that she did not follow the plan as closely as usual due to her menstrual cycle coming on and getting tooth implants.  Subjective:   1. Prediabetes Stacey Huerta has a diagnosis of prediabetes based on her elevated HgA1c and was informed this puts her at greater risk of developing diabetes. She continues to work on diet and exercise to decrease her risk of diabetes. She denies nausea or hypoglycemia.  She is not on any medications.  She does not feel well when taking metformin.  Denies polyphagia.  Lab Results  Component Value Date   HGBA1C 5.7 12/08/2019   Lab Results  Component Value Date   INSULIN 18.0 10/01/2019   INSULIN 21.8 03/05/2019   INSULIN 10.5 10/07/2018   INSULIN 15.4 06/26/2018   Assessment/Plan:   1. Prediabetes Stacey Huerta will continue to work on weight loss, exercise, and decreasing simple carbohydrates to help decrease the risk of diabetes.   2. Class 3 severe obesity with serious comorbidity and body mass index (BMI) of 40.0 to 44.9 in adult, unspecified obesity type (HCC) Stacey Huerta is currently in the action stage of change. As such, her goal is to continue with weight loss efforts. She has agreed to keeping a food journal and adhering to recommended goals of 1200-1400 calories and 105 grams of protein.   Exercise goals: As  is.  Behavioral modification strategies: meal planning and cooking strategies and keeping healthy foods in the home.  Stacey Huerta has agreed to follow-up with our clinic in 2 weeks. She was informed of the importance of frequent follow-up visits to maximize her success with intensive lifestyle modifications for her multiple health conditions.   Objective:   Blood pressure 108/76, pulse 83, temperature 98.4 F (36.9 C), temperature source Oral, height 5\' 10"  (1.778 m), weight 290 lb (131.5 kg), SpO2 97 %. Body mass index is 41.61 kg/m.  General: Cooperative, alert, well developed, in no acute distress. HEENT: Conjunctivae and lids unremarkable. Cardiovascular: Regular rhythm.  Lungs: Normal work of breathing. Neurologic: No focal deficits.   Lab Results  Component Value Date   CREATININE 0.92 12/08/2019   BUN 13 12/08/2019   NA 135 12/08/2019   K 4.4 12/08/2019   CL 103 12/08/2019   CO2 28 12/08/2019   Lab Results  Component Value Date   ALT 9 12/08/2019   AST 13 12/08/2019   ALKPHOS 62 12/08/2019   BILITOT 0.7 12/08/2019   Lab Results  Component Value Date   HGBA1C 5.7 12/08/2019   HGBA1C 5.0 10/01/2019   HGBA1C 5.0 03/05/2019   HGBA1C 4.9 10/07/2018   HGBA1C 5.0 06/26/2018   Lab Results  Component Value Date   INSULIN 18.0 10/01/2019   INSULIN 21.8 03/05/2019   INSULIN 10.5 10/07/2018   INSULIN 15.4 06/26/2018   Lab Results  Component Value Date   TSH 1.94 12/08/2019  Lab Results  Component Value Date   CHOL 159 10/01/2019   HDL 41 10/01/2019   LDLCALC 101 (H) 10/01/2019   TRIG 91 10/01/2019   CHOLHDL 4 12/19/2015   Lab Results  Component Value Date   WBC 8.2 12/17/2019   HGB 13.0 12/17/2019   HCT 38.9 12/17/2019   MCV 98.4 12/17/2019   PLT 186.0 12/17/2019   Lab Results  Component Value Date   IRON 55 12/17/2019   FERRITIN 127.2 12/17/2019   Attestation Statements:   Reviewed by clinician on day of visit: allergies, medications, problem  list, medical history, surgical history, family history, social history, and previous encounter notes.  Time spent on visit including pre-visit chart review and post-visit care and charting was 26 minutes.   I, Water quality scientist, CMA, am acting as Location manager for [employer], PA-C.  I have reviewed the above documentation for accuracy and completeness, and I agree with the above. Abby Potash, PA-C

## 2020-01-12 NOTE — Addendum Note (Signed)
Addended by: Abby Potash on: 01/12/2020 04:14 PM   Modules accepted: Orders

## 2020-01-27 ENCOUNTER — Ambulatory Visit (INDEPENDENT_AMBULATORY_CARE_PROVIDER_SITE_OTHER): Payer: 59 | Admitting: Physician Assistant

## 2020-01-27 ENCOUNTER — Other Ambulatory Visit: Payer: Self-pay

## 2020-01-27 ENCOUNTER — Encounter (INDEPENDENT_AMBULATORY_CARE_PROVIDER_SITE_OTHER): Payer: Self-pay | Admitting: Physician Assistant

## 2020-01-27 VITALS — BP 133/81 | HR 57 | Temp 98.5°F | Ht 70.0 in | Wt 294.0 lb

## 2020-01-27 DIAGNOSIS — Z6841 Body Mass Index (BMI) 40.0 and over, adult: Secondary | ICD-10-CM | POA: Diagnosis not present

## 2020-01-27 DIAGNOSIS — E559 Vitamin D deficiency, unspecified: Secondary | ICD-10-CM

## 2020-01-27 NOTE — Progress Notes (Signed)
Chief Complaint:   OBESITY Stacey Huerta is here to discuss her progress with her obesity treatment plan along with follow-up of her obesity related diagnoses. 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 Stacey Huerta states she is doing [employer] 60 minutes 5 times per week.  Today's visit was #: 21 Starting weight: 290 lbs Starting date: 06/26/2018 Today's weight: 294 lbs Today's date: 01/27/2020 Total lbs lost to date: 0 Total lbs lost since last in-office visit: 0  Interim History: Stacey Huerta states that she is on her menstrual cycle and is bloated today. She is carrying extra fluid. She notes that she is hungry on days she is eating 1200 calories, but is resistant to increasing her calories for fear of weight gain, despite encouragement by provider to do so.  Subjective:   Vitamin D deficiency. Stacey Huerta spoke with her fertility specialist about her Vitamin D level and has discontinued her Vitamin D due to her last level being greater than 100. Her specialist wants her between 75-100 per patient.  Assessment/Plan:   Vitamin D deficiency. Low Vitamin D level contributes to fatigue and are associated with obesity, breast, and colon cancer. She will follow-up for routine testing of Vitamin D next month.   Class 3 severe obesity with serious comorbidity and body mass index (BMI) of 40.0 to 44.9 in adult, unspecified obesity type ([place]).  Stacey Huerta is currently in the action stage of change. As such, her goal is to continue with weight loss efforts. She has agreed to keeping a food journal and adhering to recommended goals of 1300-1400 calories and 100 grams of protein daily.   Exercise goals: For substantial health benefits, adults should do at least 150 minutes (2 hours and 30 minutes) a week of moderate-intensity, or 75 minutes (1 hour and  15 minutes) a week of vigorous-intensity aerobic physical activity, or an equivalent combination of moderate- and vigorous-intensity aerobic activity. Aerobic activity should be performed in episodes of at least 10 minutes, and preferably, it should be spread throughout the week.  Behavioral modification strategies: travel eating strategies and planning for success.  Stacey Huerta has agreed to follow-up with our clinic in 5 weeks. She was informed of the importance of frequent follow-up visits to maximize her success with intensive lifestyle modifications for her multiple health conditions.   Objective:   Blood pressure 133/81, pulse (!) 57, temperature 98.5 F (36.9 C), temperature source Oral, height 5\' 10"  (1.778 m), weight 294 lb (133.4 kg), SpO2 100 %. Body mass index is 42.18 kg/m.  General: Cooperative, alert, well developed, in no acute distress. HEENT: Conjunctivae and lids unremarkable. Cardiovascular: Regular rhythm.  Lungs: Normal work of breathing. Neurologic: No focal deficits.   Lab Results  Component Value Date   CREATININE 0.92 12/08/2019   BUN 13 12/08/2019   NA 135 12/08/2019   K 4.4 12/08/2019   CL 103 12/08/2019   CO2 28 12/08/2019   Lab Results  Component Value Date   ALT 9 12/08/2019   AST 13 12/08/2019   ALKPHOS 62 12/08/2019   BILITOT 0.7 12/08/2019   Lab Results  Component Value Date   HGBA1C 5.7 12/08/2019   HGBA1C 5.0 10/01/2019   HGBA1C 5.0 03/05/2019   HGBA1C 4.9 10/07/2018   HGBA1C 5.0 06/26/2018   Lab Results  Component Value Date   INSULIN 18.0 10/01/2019  INSULIN 21.8 03/05/2019   INSULIN 10.5 10/07/2018   INSULIN 15.4 06/26/2018   Lab Results  Component Value Date   TSH 1.94 12/08/2019   Lab Results  Component Value Date   CHOL 159 10/01/2019   HDL 41 10/01/2019   LDLCALC 101 (H) 10/01/2019   TRIG 91 10/01/2019   CHOLHDL 4 12/19/2015   Lab Results  Component Value Date   WBC 8.2 12/17/2019   HGB 13.0 12/17/2019   HCT  38.9 12/17/2019   MCV 98.4 12/17/2019   PLT 186.0 12/17/2019   Lab Results  Component Value Date   IRON 55 12/17/2019   FERRITIN 127.2 12/17/2019   Attestation Statements:   Reviewed by clinician on day of visit: allergies, medications, problem list, medical history, surgical history, family history, social history, and previous encounter notes.  Time spent on visit including pre-visit chart review and post-visit charting and care was 25 minutes.   IMichaelene Song, am acting as transcriptionist for Abby Potash, PA-C   I have reviewed the above documentation for accuracy and completeness, and I agree with the above. Abby Potash, PA-C

## 2020-02-29 ENCOUNTER — Ambulatory Visit (INDEPENDENT_AMBULATORY_CARE_PROVIDER_SITE_OTHER): Payer: 59 | Admitting: Physician Assistant

## 2020-02-29 ENCOUNTER — Other Ambulatory Visit: Payer: Self-pay

## 2020-02-29 VITALS — BP 112/72 | HR 82 | Temp 99.0°F | Ht 70.0 in | Wt 301.0 lb

## 2020-02-29 DIAGNOSIS — E559 Vitamin D deficiency, unspecified: Secondary | ICD-10-CM | POA: Diagnosis not present

## 2020-02-29 DIAGNOSIS — Z6841 Body Mass Index (BMI) 40.0 and over, adult: Secondary | ICD-10-CM

## 2020-02-29 NOTE — Progress Notes (Signed)
Chief Complaint:   OBESITY Stacey Huerta is here to discuss her progress with her obesity treatment plan along with follow-up of her obesity related diagnoses. 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 Stacey Huerta states she is doing [employer] 60 minutes 5 times per week.  Today's visit was #: 28 Starting weight: 290 lbs Starting date: 06/26/2018 Today's weight: 301 lbs Today's date: 02/29/2020 Total lbs lost to date: 0 Total lbs lost since last in-office visit: 0  Interim History: Stacey Huerta states that she got off of her eating plan while visiting and taking care of her mom.  Subjective:   Vitamin D deficiency. Pearlena is on OTC Vitamin D by her fertility specialist. No nausea, vomiting, or muscle weakness. Last Vitamin D 102.78 on 12/17/2019.  Assessment/Plan:   Vitamin D deficiency. Low Vitamin D level contributes to fatigue and are associated with obesity, breast, and colon cancer. She agrees to continue to take Vitamin D and will follow-up for routine testing of Vitamin D, at least 2-3 times per year to avoid over-replacement.  Class 3 severe obesity with serious comorbidity and body mass index (BMI) of 40.0 to 44.9 in adult, unspecified obesity type ([place]).  Stacey Huerta is currently in the action stage of change. As such, her goal is to continue with weight loss efforts. She has agreed to keeping a food journal and adhering to recommended goals of 1400 calories and 100 grams of protein daily.   Exercise goals: For substantial health benefits, adults should do at least 150 minutes (2 hours and 30 minutes) a week of moderate-intensity, or 75 minutes (1 hour and 15 minutes) a week of vigorous-intensity aerobic physical activity, or an equivalent combination of moderate- and vigorous-intensity aerobic activity. Aerobic activity should be  performed in episodes of at least 10 minutes, and preferably, it should be spread throughout the week.  Behavioral modification strategies: planning for success and keeping a strict food journal.  Stacey Huerta has agreed to follow-up with our clinic in 2 weeks. She was informed of the importance of frequent follow-up visits to maximize her success with intensive lifestyle modifications for her multiple health conditions.   Objective:   Blood pressure 112/72, pulse 82, temperature 99 F (37.2 C), temperature source Oral, height 5\' 10"  (1.778 m), weight (!) 301 lb (136.5 kg), SpO2 99 %. Body mass index is 43.19 kg/m.  General: Cooperative, alert, well developed, in no acute distress. HEENT: Conjunctivae and lids unremarkable. Cardiovascular: Regular rhythm.  Lungs: Normal work of breathing. Neurologic: No focal deficits.   Lab Results  Component Value Date   CREATININE 0.92 12/08/2019   BUN 13 12/08/2019   NA 135 12/08/2019   K 4.4 12/08/2019   CL 103 12/08/2019   CO2 28 12/08/2019   Lab Results  Component Value Date   ALT 9 12/08/2019   AST 13 12/08/2019   ALKPHOS 62 12/08/2019   BILITOT 0.7 12/08/2019   Lab Results  Component Value Date   HGBA1C 5.7 12/08/2019   HGBA1C 5.0 10/01/2019   HGBA1C 5.0 03/05/2019   HGBA1C 4.9 10/07/2018   HGBA1C 5.0 06/26/2018   Lab Results  Component Value Date   INSULIN 18.0 10/01/2019   INSULIN 21.8 03/05/2019   INSULIN 10.5 10/07/2018   INSULIN 15.4 06/26/2018   Lab Results  Component Value Date   TSH 1.94 12/08/2019  Lab Results  Component Value Date   CHOL 159 10/01/2019   HDL 41 10/01/2019   LDLCALC 101 (H) 10/01/2019   TRIG 91 10/01/2019   CHOLHDL 4 12/19/2015   Lab Results  Component Value Date   WBC 8.2 12/17/2019   HGB 13.0 12/17/2019   HCT 38.9 12/17/2019   MCV 98.4 12/17/2019   PLT 186.0 12/17/2019   Lab Results  Component Value Date   IRON 55 12/17/2019   FERRITIN 127.2 12/17/2019   Attestation  Statements:   Reviewed by clinician on day of visit: allergies, medications, problem list, medical history, surgical history, family history, social history, and previous encounter notes.  Time spent on visit including pre-visit chart review and post-visit charting and care was 30 minutes.   IMichaelene Song, am acting as transcriptionist for Abby Potash, PA-C   I have reviewed the above documentation for accuracy and completeness, and I agree with the above. Abby Potash, PA-C

## 2020-03-16 ENCOUNTER — Ambulatory Visit (INDEPENDENT_AMBULATORY_CARE_PROVIDER_SITE_OTHER): Payer: 59 | Admitting: Physician Assistant

## 2020-03-17 ENCOUNTER — Encounter (INDEPENDENT_AMBULATORY_CARE_PROVIDER_SITE_OTHER): Payer: Self-pay | Admitting: Physician Assistant

## 2020-03-17 ENCOUNTER — Other Ambulatory Visit: Payer: Self-pay

## 2020-03-17 ENCOUNTER — Ambulatory Visit (INDEPENDENT_AMBULATORY_CARE_PROVIDER_SITE_OTHER): Payer: No Typology Code available for payment source | Admitting: Physician Assistant

## 2020-03-17 VITALS — BP 125/86 | HR 103 | Temp 98.6°F | Ht 70.0 in | Wt 302.0 lb

## 2020-03-17 DIAGNOSIS — Z6841 Body Mass Index (BMI) 40.0 and over, adult: Secondary | ICD-10-CM | POA: Diagnosis not present

## 2020-03-17 DIAGNOSIS — E7849 Other hyperlipidemia: Secondary | ICD-10-CM

## 2020-03-17 NOTE — Progress Notes (Signed)
Chief Complaint:   OBESITY Stacey Huerta is here to discuss her progress with her obesity treatment plan along with follow-up of her obesity related diagnoses. Stacey Huerta is not following a meal plan at this time. Stacey Huerta states she is doing [employer] 60 minutes 2-5 times per week.  Today's visit was #: 71 Starting weight: 290 lbs Starting date: 06/26/2018 Today's weight: 302 lbs Today's date: 03/17/2020 Total lbs lost to date: 0 Total lbs lost since last in-office visit: 0  Interim History: Stacey Huerta has not been following the plan recently as her mood has been down. She is frustrated with the lack of weight loss and wants to take a break from the program.  Subjective:   Other hyperlipidemia. No chest pain. Stacey Huerta is on no medication. She is exercising regularly.   Lab Results  Component Value Date   CHOL 159 10/01/2019   HDL 41 10/01/2019   LDLCALC 101 (H) 10/01/2019   TRIG 91 10/01/2019   CHOLHDL 4 12/19/2015   Lab Results  Component Value Date   ALT 9 12/08/2019   AST 13 12/08/2019   ALKPHOS 62 12/08/2019   BILITOT 0.7 12/08/2019   The 10-year ASCVD risk score Stacey Bussing DC Jr., et al., 2013) is: 1.4%   Values used to calculate the score:     Age: 44 years     Sex: Female     Is Non-Hispanic African American: Yes     Diabetic: No     Tobacco smoker: No     Systolic Blood Pressure: 0000000 mmHg     Is BP treated: No     HDL Cholesterol: 41 mg/dL     Total Cholesterol: 159 mg/dL  Assessment/Plan:   Other hyperlipidemia. Cardiovascular risk and specific lipid/LDL goals reviewed.  We discussed several lifestyle modifications today and Stacey Huerta will continue to work on diet, exercise and weight loss efforts. Orders and follow up as documented in patient record.   Counseling Intensive lifestyle modifications are the first line treatment for this issue. . Dietary changes: Increase soluble fiber. Decrease simple carbohydrates. . Exercise changes: Moderate to  vigorous-intensity aerobic activity 150 minutes per week if tolerated. . Lipid-lowering medications: see documented in medical record.  Class 3 severe obesity with serious comorbidity and body mass index (BMI) of 40.0 to 44.9 in adult, unspecified obesity type (Stacey Huerta).  Stacey Huerta is currently in the action stage of change. As such, her goal is to continue with weight loss efforts. She has agreed to keeping a food journal and adhering to recommended goals of 1400-1500 calories and 95 grams of protein daily.   She will have labs drawn at her next visit.  Exercise goals: For substantial health benefits, adults should do at least 150 minutes (2 hours and 30 minutes) a week of moderate-intensity, or 75 minutes (1 hour and 15 minutes) a week of vigorous-intensity aerobic physical activity, or an equivalent combination of moderate- and vigorous-intensity aerobic activity. Aerobic activity should be performed in episodes of at least 10 minutes, and preferably, it should be spread throughout the week.  Behavioral modification strategies: meal planning and cooking strategies and keeping healthy foods in the home.  Stacey Huerta has agreed to follow-up with our clinic in 8 weeks. She was informed of the importance of frequent follow-up visits to maximize her success with intensive lifestyle modifications for her multiple health conditions.   Objective:   Blood pressure 125/86, pulse (!) 103, temperature 98.6 F (37 C), temperature source Oral, height 5\' 10"  (  1.778 m), weight (!) 302 lb (137 kg), SpO2 96 %. Body mass index is 43.33 kg/m.  General: Cooperative, alert, well developed, in no acute distress. HEENT: Conjunctivae and lids unremarkable. Cardiovascular: Regular rhythm.  Lungs: Normal work of breathing. Neurologic: No focal deficits.   Lab Results  Component Value Date   CREATININE 0.92 12/08/2019   BUN 13 12/08/2019   NA 135 12/08/2019   K 4.4 12/08/2019   CL 103 12/08/2019   CO2 28 12/08/2019    Lab Results  Component Value Date   ALT 9 12/08/2019   AST 13 12/08/2019   ALKPHOS 62 12/08/2019   BILITOT 0.7 12/08/2019   Lab Results  Component Value Date   HGBA1C 5.7 12/08/2019   HGBA1C 5.0 10/01/2019   HGBA1C 5.0 03/05/2019   HGBA1C 4.9 10/07/2018   HGBA1C 5.0 06/26/2018   Lab Results  Component Value Date   INSULIN 18.0 10/01/2019   INSULIN 21.8 03/05/2019   INSULIN 10.5 10/07/2018   INSULIN 15.4 06/26/2018   Lab Results  Component Value Date   TSH 1.94 12/08/2019   Lab Results  Component Value Date   CHOL 159 10/01/2019   HDL 41 10/01/2019   LDLCALC 101 (H) 10/01/2019   TRIG 91 10/01/2019   CHOLHDL 4 12/19/2015   Lab Results  Component Value Date   WBC 8.2 12/17/2019   HGB 13.0 12/17/2019   HCT 38.9 12/17/2019   MCV 98.4 12/17/2019   PLT 186.0 12/17/2019   Lab Results  Component Value Date   IRON 55 12/17/2019   FERRITIN 127.2 12/17/2019   Attestation Statements:   Reviewed by clinician on day of visit: allergies, medications, problem list, medical history, surgical history, family history, social history, and previous encounter notes.  Time spent on visit including pre-visit chart review and post-visit charting and care was 31 minutes.   IMichaelene Song, am acting as transcriptionist for Abby Potash, PA-C   I have reviewed the above documentation for accuracy and completeness, and I agree with the above. Abby Potash, PA-C

## 2020-03-29 ENCOUNTER — Encounter (INDEPENDENT_AMBULATORY_CARE_PROVIDER_SITE_OTHER): Payer: Self-pay | Admitting: Physician Assistant

## 2020-03-29 NOTE — Telephone Encounter (Signed)
Please advise 

## 2020-05-12 ENCOUNTER — Ambulatory Visit (INDEPENDENT_AMBULATORY_CARE_PROVIDER_SITE_OTHER): Payer: No Typology Code available for payment source | Admitting: Physician Assistant

## 2020-05-12 ENCOUNTER — Encounter (INDEPENDENT_AMBULATORY_CARE_PROVIDER_SITE_OTHER): Payer: Self-pay | Admitting: Physician Assistant

## 2020-05-12 ENCOUNTER — Other Ambulatory Visit: Payer: Self-pay

## 2020-05-12 VITALS — BP 110/74 | HR 99 | Temp 98.1°F | Ht 70.0 in | Wt 305.0 lb

## 2020-05-12 DIAGNOSIS — Z6841 Body Mass Index (BMI) 40.0 and over, adult: Secondary | ICD-10-CM

## 2020-05-12 DIAGNOSIS — R7303 Prediabetes: Secondary | ICD-10-CM

## 2020-05-12 DIAGNOSIS — E559 Vitamin D deficiency, unspecified: Secondary | ICD-10-CM | POA: Diagnosis not present

## 2020-05-12 DIAGNOSIS — E7849 Other hyperlipidemia: Secondary | ICD-10-CM

## 2020-05-12 DIAGNOSIS — Z9189 Other specified personal risk factors, not elsewhere classified: Secondary | ICD-10-CM

## 2020-05-12 NOTE — Progress Notes (Signed)
Chief Complaint:   OBESITY Stacey Huerta is here to discuss her progress with her obesity treatment plan along with follow-up of her obesity related diagnoses. 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 Stacey Huerta states she is going to the gym 60 minutes 5 times per week.  Today's visit was #: 64 Starting weight: 290 lbs Starting date: 06/26/2018 Today's weight: 305 lbs Today's date: 05/12/2020 Total lbs lost to date: 0 Total lbs lost since last in-office visit: 0  Interim History: Stacey Huerta reports that she has been on vacation and has not been following the plan. She states that her fertility specialist put her on phentermine 2 weeks ago but she does not feel that it is helping. She is frustrated with the lack of weight loss overall.  Subjective:   Other hyperlipidemia. Stacey Huerta is on no medication. Last LDL was elevated at 101 on 10/01/2019. No chest pain. She is due for labs.  Lab Results  Component Value Date   CHOL 159 10/01/2019   HDL 41 10/01/2019   LDLCALC 101 (H) 10/01/2019   TRIG 91 10/01/2019   CHOLHDL 4 12/19/2015   Lab Results  Component Value Date   ALT 9 12/08/2019   AST 13 12/08/2019   ALKPHOS 62 12/08/2019   BILITOT 0.7 12/08/2019   The 10-year ASCVD risk score Mikey Bussing DC Jr., et al., 2013) is: 0.8%   Values used to calculate the score:     Age: 44 years     Sex: Female     Is Non-Hispanic African American: Yes     Diabetic: No     Tobacco smoker: No     Systolic Blood Pressure: 952 mmHg     Is BP treated: No     HDL Cholesterol: 41 mg/dL     Total Cholesterol: 159 mg/dL  Prediabetes. Stacey Huerta has a diagnosis of prediabetes based on her elevated HgA1c and was informed this puts her at greater risk of developing diabetes. She continues to work on diet and exercise to decrease her risk of diabetes. She  denies nausea or hypoglycemia. Stacey Huerta is on no medication. She reports that she restarted metformin every other day and is tolerating it well.  Lab Results  Component Value Date   HGBA1C 5.7 12/08/2019   Lab Results  Component Value Date   INSULIN 18.0 10/01/2019   INSULIN 21.8 03/05/2019   INSULIN 10.5 10/07/2018   INSULIN 15.4 06/26/2018   Vitamin D deficiency. Last Vitamin D level was elevated, however, per the patient her fertility specialist is following the level. She reports her fertility specialist wants her Vitamin D level above 100.   Ref. Range 12/17/2019 15:22  VITD Latest Ref Range: 30.00 - 100.00 ng/mL 102.78 ([facility])   At risk for diabetes mellitus. Stacey Huerta is at higher than average risk for developing diabetes due to her obesity.   Assessment/Plan:   Other hyperlipidemia. Cardiovascular risk and specific lipid/LDL goals reviewed.  We discussed several lifestyle modifications today and Stacey Huerta will continue to work on diet, exercise and weight loss efforts. Orders and follow up as documented in patient record. Lipid panel will be checked today.  Counseling Intensive lifestyle modifications are the first line treatment for this issue.  Dietary changes: Increase soluble fiber. Decrease simple carbohydrates.  Exercise changes: Moderate to vigorous-intensity aerobic activity 150 minutes per week if tolerated.  Lipid-lowering medications: see documented in medical record.   Prediabetes. Stacey Huerta will continue to work on weight loss, exercise, and decreasing simple carbohydrates to help decrease the risk of diabetes. She will continue metformin as directed. Insulin, random, Hemoglobin A1c, Comprehensive metabolic panel labs will be checked today.   Vitamin D deficiency. Low Vitamin D level contributes to fatigue and are associated with obesity, breast, and colon cancer. She agrees to continue to take Vitamin D @ 5,000 IU every other day and will VITAMIN D 25 Hydroxy (Vit-D  Deficiency, Fractures) will be checked today.  At risk for diabetes mellitus. Stacey Huerta was given approximately 15 minutes of diabetes education and counseling today. We discussed intensive lifestyle modifications today with an emphasis on weight loss as well as increasing exercise and decreasing simple carbohydrates in her diet. We also reviewed medication options with an emphasis on risk versus benefit of those discussed.   Repetitive spaced learning was employed today to elicit superior memory formation and behavioral change.  Class 3 severe obesity with serious comorbidity and body mass index (BMI) of 40.0 to 44.9 in adult, unspecified obesity type ([place]).  Stacey Huerta is currently in the action stage of change. As such, her goal is to continue with weight loss efforts. She has agreed to keeping a food journal and adhering to recommended goals of 1500-1600 calories and 95 grams of protein daily.   Exercise goals: For substantial health benefits, adults should do at least 150 minutes (2 hours and 30 minutes) a week of moderate-intensity, or 75 minutes (1 hour and 15 minutes) a week of vigorous-intensity aerobic physical activity, or an equivalent combination of moderate- and vigorous-intensity aerobic activity. Aerobic activity should be performed in episodes of at least 10 minutes, and preferably, it should be spread throughout the week.  Behavioral modification strategies: planning for success and keeping a strict food journal.  Stacey Huerta has agreed to follow-up with our clinic in 2-3 weeks. She was informed of the importance of frequent follow-up visits to maximize her success with intensive lifestyle modifications for her multiple health conditions.   Stacey Huerta was informed we would discuss her lab results at her next visit unless there is a critical issue that needs to be addressed sooner. Stacey Huerta agreed to keep her next visit at the agreed upon time to discuss these results.  Objective:   Blood  pressure 110/74, pulse 99, temperature 98.1 F (36.7 C), temperature source Oral, height 5\' 10"  (1.778 m), weight (!) 305 lb (138.3 kg), last menstrual period 05/09/2020, SpO2 100 %. Body mass index is 43.76 kg/m.  General: Cooperative, alert, well developed, in no acute distress. HEENT: Conjunctivae and lids unremarkable. Cardiovascular: Regular rhythm.  Lungs: Normal work of breathing. Neurologic: No focal deficits.   Lab Results  Component Value Date   CREATININE 0.92 12/08/2019   BUN 13 12/08/2019   NA 135 12/08/2019   K 4.4 12/08/2019   CL 103 12/08/2019   CO2 28 12/08/2019   Lab Results  Component Value Date   ALT 9 12/08/2019   AST 13 12/08/2019   ALKPHOS 62 12/08/2019   BILITOT 0.7 12/08/2019   Lab Results  Component Value Date   HGBA1C 5.7 12/08/2019   HGBA1C 5.0 10/01/2019   HGBA1C 5.0 03/05/2019   HGBA1C 4.9 10/07/2018   HGBA1C 5.0 06/26/2018   Lab Results  Component Value Date   INSULIN 18.0 10/01/2019   INSULIN 21.8 03/05/2019   INSULIN 10.5 10/07/2018   INSULIN 15.4 06/26/2018   Lab Results  Component  Value Date   TSH 1.94 12/08/2019   Lab Results  Component Value Date   CHOL 159 10/01/2019   HDL 41 10/01/2019   LDLCALC 101 (H) 10/01/2019   TRIG 91 10/01/2019   CHOLHDL 4 12/19/2015   Lab Results  Component Value Date   WBC 8.2 12/17/2019   HGB 13.0 12/17/2019   HCT 38.9 12/17/2019   MCV 98.4 12/17/2019   PLT 186.0 12/17/2019   Lab Results  Component Value Date   IRON 55 12/17/2019   FERRITIN 127.2 12/17/2019   Attestation Statements:   Reviewed by clinician on day of visit: allergies, medications, problem list, medical history, surgical history, family history, social history, and previous encounter notes.  IMichaelene Song, am acting as transcriptionist for Abby Potash, PA-C   I have reviewed the above documentation for accuracy and completeness, and I agree with the above. Abby Potash, PA-C

## 2020-05-13 LAB — COMPREHENSIVE METABOLIC PANEL
ALT: 11 IU/L (ref 0–32)
AST: 13 IU/L (ref 0–40)
Albumin/Globulin Ratio: 1 — ABNORMAL LOW (ref 1.2–2.2)
Albumin: 3.4 g/dL — ABNORMAL LOW (ref 3.8–4.8)
Alkaline Phosphatase: 92 IU/L (ref 48–121)
BUN/Creatinine Ratio: 12 (ref 9–23)
BUN: 12 mg/dL (ref 6–24)
Bilirubin Total: 0.3 mg/dL (ref 0.0–1.2)
CO2: 27 mmol/L (ref 20–29)
Calcium: 8.8 mg/dL (ref 8.7–10.2)
Chloride: 103 mmol/L (ref 96–106)
Creatinine, Ser: 1.03 mg/dL — ABNORMAL HIGH (ref 0.57–1.00)
GFR calc Af Amer: 75 mL/min/{1.73_m2} (ref >59)
GFR calc non Af Amer: 65 mL/min/{1.73_m2} (ref >59)
Globulin, Total: 3.3 g/dL (ref 1.5–4.5)
Glucose: 88 mg/dL (ref 65–99)
Potassium: 3.9 mmol/L (ref 3.5–5.2)
Sodium: 141 mmol/L (ref 134–144)
Total Protein: 6.7 g/dL (ref 6.0–8.5)

## 2020-05-13 LAB — HEMOGLOBIN A1C
Est. average glucose Bld gHb Est-mCnc: 111 mg/dL
Hgb A1c MFr Bld: 5.5 % (ref 4.8–5.6)

## 2020-05-13 LAB — LIPID PANEL
Chol/HDL Ratio: 3.5 ratio (ref 0.0–4.4)
Cholesterol, Total: 152 mg/dL (ref 100–199)
HDL: 43 mg/dL (ref >39)
LDL Chol Calc (NIH): 92 mg/dL (ref 0–99)
Triglycerides: 90 mg/dL (ref 0–149)
VLDL Cholesterol Cal: 17 mg/dL (ref 5–40)

## 2020-05-13 LAB — VITAMIN D 25 HYDROXY (VIT D DEFICIENCY, FRACTURES): Vit D, 25-Hydroxy: 51.2 ng/mL (ref 30.0–100.0)

## 2020-05-13 LAB — INSULIN, RANDOM: INSULIN: 27.5 u[IU]/mL — ABNORMAL HIGH (ref 2.6–24.9)

## 2020-06-02 ENCOUNTER — Ambulatory Visit (INDEPENDENT_AMBULATORY_CARE_PROVIDER_SITE_OTHER): Payer: No Typology Code available for payment source | Admitting: Physician Assistant

## 2020-06-07 ENCOUNTER — Ambulatory Visit (INDEPENDENT_AMBULATORY_CARE_PROVIDER_SITE_OTHER): Payer: No Typology Code available for payment source | Admitting: Physician Assistant

## 2020-06-07 ENCOUNTER — Other Ambulatory Visit: Payer: Self-pay

## 2020-06-07 ENCOUNTER — Encounter (INDEPENDENT_AMBULATORY_CARE_PROVIDER_SITE_OTHER): Payer: Self-pay | Admitting: Physician Assistant

## 2020-06-07 VITALS — BP 109/74 | HR 88 | Temp 98.4°F | Ht 70.0 in | Wt 309.0 lb

## 2020-06-07 DIAGNOSIS — R7303 Prediabetes: Secondary | ICD-10-CM | POA: Diagnosis not present

## 2020-06-07 DIAGNOSIS — R0602 Shortness of breath: Secondary | ICD-10-CM

## 2020-06-07 DIAGNOSIS — Z9189 Other specified personal risk factors, not elsewhere classified: Secondary | ICD-10-CM | POA: Diagnosis not present

## 2020-06-07 DIAGNOSIS — Z6841 Body Mass Index (BMI) 40.0 and over, adult: Secondary | ICD-10-CM

## 2020-06-07 DIAGNOSIS — E559 Vitamin D deficiency, unspecified: Secondary | ICD-10-CM | POA: Diagnosis not present

## 2020-06-07 MED ORDER — METFORMIN HCL ER 500 MG PO TB24: 500.0000 mg | ORAL_TABLET | Freq: Every day | ORAL | 0 refills | Status: DC

## 2020-06-07 MED ORDER — VITAMIN D (ERGOCALCIFEROL) 1.25 MG (50000 UNIT) PO CAPS: 50000.0000 [IU] | ORAL_CAPSULE | ORAL | 0 refills | Status: DC

## 2020-06-08 NOTE — Progress Notes (Signed)
Chief Complaint:   OBESITY Stacey Huerta is here to discuss her progress with her obesity treatment plan along with follow-up of her obesity related diagnoses. 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 Stacey Huerta states she is doing [employer] 60 minutes 5 times per week.  Today's visit was #: 12 Starting weight: 290 lbs Starting date: 06/26/2018 Today's weight: 309 lbs Today's date: 06/07/2020 Total lbs lost to date: 0 Total lbs lost since last in-office visit: 0  Interim History: Stacey Huerta had an IC today, which had increased to 2276. She denies excessive hunger. She is traveling to [place] in the next 2 weeks.  Subjective:   Shortness of breath on exertion. Stacey Huerta notes increasing shortness of breath with exercising and seems to be worsening over time with weight gain. She notes getting out of breath sooner with activity than she used to. Stacey Huerta denies shortness of breath at rest or orthopnea. No dizziness or lightheadedness.  Vitamin D deficiency. Stacey Huerta is not currently taking Vitamin D supplementation.    Ref. Range 05/12/2020 13:27  Vitamin D, 25-Hydroxy Latest Ref Range: 30.0 - 100.0 ng/mL 51.2   Prediabetes. Stacey Huerta has a diagnosis of prediabetes based on her elevated HgA1c and was informed this puts her at greater risk of developing diabetes. She continues to work on diet and exercise to decrease her risk of diabetes. She denies nausea or hypoglycemia. Stacey Huerta is not currently taking metformin.  Lab Results  Component Value Date   HGBA1C 5.5 05/12/2020   Lab Results  Component Value Date   INSULIN 27.5 (H) 05/12/2020   INSULIN 18.0 10/01/2019   INSULIN 21.8 03/05/2019   INSULIN 10.5 10/07/2018   INSULIN 15.4 06/26/2018   At risk for diabetes mellitus. Stacey Huerta is at higher than average risk for  developing diabetes due to her obesity.   Assessment/Plan:   Shortness of breath on exertion. [name]'s shortness of breath appears to be obesity related and exercise induced. She has agreed to work on weight loss and gradually increase exercise to treat her exercise induced shortness of breath. Will continue to monitor closely. IC was performed.  Vitamin D deficiency. Low Vitamin D level contributes to fatigue and are associated with obesity, breast, and colon cancer. She will start Vitamin D, Ergocalciferol, (DRISDOL) 1.25 MG (50000 UNIT) CAPS capsule every week #4 with 0 refills and will follow-up for routine testing of Vitamin D, at least 2-3 times per year to avoid over-replacement.   Prediabetes. Stacey Huerta will continue to work on weight loss, exercise, and decreasing simple carbohydrates to help decrease the risk of diabetes. She will start metFORMIN (GLUCOPHAGE XR) 500 MG 24 hr tablet 1 PO daily #90 with 0 refills.  At risk for diabetes mellitus. Stacey Huerta was given approximately 15 minutes of diabetes education and counseling today. We discussed intensive lifestyle modifications today with an emphasis on weight loss as well as increasing exercise and decreasing simple carbohydrates in her diet. We also reviewed medication options with an emphasis on risk versus benefit of those discussed.   Repetitive spaced learning was employed today to elicit superior memory formation and behavioral change.  Class 3 severe obesity with serious comorbidity and body mass index (BMI) of 40.0 to 44.9 in adult, unspecified obesity type (Stacey Huerta).  Stacey Huerta is currently in the action stage of change. As such, her goal is  to continue with weight loss efforts. She has agreed to keeping a food journal and adhering to recommended goals of 1800-1900 calories and 115 grams of protein daily.   Exercise goals: For substantial health benefits, adults should do at least 150 minutes (2 hours and 30 minutes) a week of  moderate-intensity, or 75 minutes (1 hour and 15 minutes) a week of vigorous-intensity aerobic physical activity, or an equivalent combination of moderate- and vigorous-intensity aerobic activity. Aerobic activity should be performed in episodes of at least 10 minutes, and preferably, it should be spread throughout the week.  Behavioral modification strategies: increasing lean protein intake and planning for success.  Stacey Huerta has agreed to follow-up with our clinic in 3 weeks. She was informed of the importance of frequent follow-up visits to maximize her success with intensive lifestyle modifications for her multiple health conditions.   Objective:   Blood pressure 109/74, pulse 88, temperature 98.4 F (36.9 C), temperature source Oral, height 5\' 10"  (1.778 m), weight (!) 309 lb (140.2 kg), last menstrual period 05/09/2020, SpO2 98 %. Body mass index is 44.34 kg/m.  General: Cooperative, alert, well developed, in no acute distress. HEENT: Conjunctivae and lids unremarkable. Cardiovascular: Regular rhythm.  Lungs: Normal work of breathing. Neurologic: No focal deficits.   Lab Results  Component Value Date   CREATININE 1.03 (H) 05/12/2020   BUN 12 05/12/2020   NA 141 05/12/2020   K 3.9 05/12/2020   CL 103 05/12/2020   CO2 27 05/12/2020   Lab Results  Component Value Date   ALT 11 05/12/2020   AST 13 05/12/2020   ALKPHOS 92 05/12/2020   BILITOT 0.3 05/12/2020   Lab Results  Component Value Date   HGBA1C 5.5 05/12/2020   HGBA1C 5.7 12/08/2019   HGBA1C 5.0 10/01/2019   HGBA1C 5.0 03/05/2019   HGBA1C 4.9 10/07/2018   Lab Results  Component Value Date   INSULIN 27.5 (H) 05/12/2020   INSULIN 18.0 10/01/2019   INSULIN 21.8 03/05/2019   INSULIN 10.5 10/07/2018   INSULIN 15.4 06/26/2018   Lab Results  Component Value Date   TSH 1.94 12/08/2019   Lab Results  Component Value Date   CHOL 152 05/12/2020   HDL 43 05/12/2020   LDLCALC 92 05/12/2020   TRIG 90 05/12/2020    CHOLHDL 3.5 05/12/2020   Lab Results  Component Value Date   WBC 8.2 12/17/2019   HGB 13.0 12/17/2019   HCT 38.9 12/17/2019   MCV 98.4 12/17/2019   PLT 186.0 12/17/2019   Lab Results  Component Value Date   IRON 55 12/17/2019   FERRITIN 127.2 12/17/2019   Attestation Statements:   Reviewed by clinician on day of visit: allergies, medications, problem list, medical history, surgical history, family history, social history, and previous encounter notes.  IMichaelene Song, am acting as transcriptionist for Abby Potash, PA-C   I have reviewed the above documentation for accuracy and completeness, and I agree with the above. Abby Potash, PA-C

## 2020-06-27 ENCOUNTER — Other Ambulatory Visit (INDEPENDENT_AMBULATORY_CARE_PROVIDER_SITE_OTHER): Payer: Self-pay | Admitting: Physician Assistant

## 2020-06-27 DIAGNOSIS — E559 Vitamin D deficiency, unspecified: Secondary | ICD-10-CM

## 2020-06-30 ENCOUNTER — Other Ambulatory Visit: Payer: Self-pay

## 2020-06-30 ENCOUNTER — Encounter (INDEPENDENT_AMBULATORY_CARE_PROVIDER_SITE_OTHER): Payer: Self-pay | Admitting: Physician Assistant

## 2020-06-30 ENCOUNTER — Ambulatory Visit (INDEPENDENT_AMBULATORY_CARE_PROVIDER_SITE_OTHER): Payer: No Typology Code available for payment source | Admitting: Physician Assistant

## 2020-06-30 VITALS — BP 94/66 | HR 75 | Temp 98.2°F | Ht 70.0 in | Wt 310.0 lb

## 2020-06-30 DIAGNOSIS — E7849 Other hyperlipidemia: Secondary | ICD-10-CM

## 2020-06-30 DIAGNOSIS — Z9189 Other specified personal risk factors, not elsewhere classified: Secondary | ICD-10-CM | POA: Diagnosis not present

## 2020-06-30 DIAGNOSIS — E559 Vitamin D deficiency, unspecified: Secondary | ICD-10-CM | POA: Diagnosis not present

## 2020-06-30 DIAGNOSIS — Z6841 Body Mass Index (BMI) 40.0 and over, adult: Secondary | ICD-10-CM | POA: Diagnosis not present

## 2020-06-30 MED ORDER — VITAMIN D (ERGOCALCIFEROL) 1.25 MG (50000 UNIT) PO CAPS: 50000.0000 [IU] | ORAL_CAPSULE | ORAL | 0 refills | Status: DC

## 2020-07-03 NOTE — Progress Notes (Signed)
Chief Complaint:   OBESITY Stacey Huerta is here to discuss her progress with her obesity treatment plan along with follow-up of her obesity related diagnoses. Stacey Huerta is on keeping a food journal and adhering to recommended goals of 1800-1900 calories and 115 grams of protein and states she is following her eating plan approximately 60% of the time. Stacey Huerta states she is doing [employer] for 60 minutes 3-4 times per week.  Today's visit was #: 3 Starting weight: 290 lbs Starting date: 06/26/2018 Today's weight: 310 lbs Today's date: 06/30/2020 Total lbs lost to date: 0 Total lbs lost since last in-office visit: 0  Interim History: Stacey Huerta says she was out of town for 2 weeks and did not journal.  After returning, she has been getting around 1700 calories and getting 100 grams of protein.  She is traveling to [facility] next week and then going to see her mom after that.  Her fertility doctor started her on Victoza.  Subjective:   1. Vitamin D deficiency Stacey Huerta's Vitamin D level was 51.2 on 05/12/2020. She is currently taking prescription vitamin D 50,000 IU each week. She denies nausea, vomiting or muscle weakness.  Tolerating well.  2. Other hyperlipidemia Stacey Huerta has hyperlipidemia and has been trying to improve her cholesterol levels with intensive lifestyle modification including a low saturated fat diet, exercise and weight loss. She denies any chest pain, claudication or myalgias.  Last lipid panel at goal.  She is taking fish oil.  Lab Results  Component Value Date   ALT 11 05/12/2020   AST 13 05/12/2020   ALKPHOS 92 05/12/2020   BILITOT 0.3 05/12/2020   Lab Results  Component Value Date   CHOL 152 05/12/2020   HDL 43 05/12/2020   LDLCALC 92 05/12/2020   TRIG 90 05/12/2020   CHOLHDL 3.5 05/12/2020   3. At risk for heart disease Stacey Huerta is at a higher than average risk for cardiovascular disease due to obesity.   Assessment/Plan:   1. Vitamin D deficiency Low Vitamin  D level contributes to fatigue and are associated with obesity, breast, and colon cancer. She agrees to continue to take prescription Vitamin D @50 ,000 IU every week and will follow-up for routine testing of Vitamin D, at least 2-3 times per year to avoid over-replacement.  -Refill Vitamin D, Ergocalciferol, (DRISDOL) 1.25 MG (50000 UNIT) CAPS capsule; Take 1 capsule (50,000 Units total) by mouth every 7 (seven) days.  Dispense: 4 capsule; Refill: 0  2. Other hyperlipidemia Cardiovascular risk and specific lipid/LDL goals reviewed.  We discussed several lifestyle modifications today and Stacey Huerta will continue to work on diet, exercise and weight loss efforts. Orders and follow up as documented in patient record.  Continue fish oil.  Counseling Intensive lifestyle modifications are the first line treatment for this issue. . Dietary changes: Increase soluble fiber. Decrease simple carbohydrates. . Exercise changes: Moderate to vigorous-intensity aerobic activity 150 minutes per week if tolerated. . Lipid-lowering medications: see documented in medical record.  3. At risk for heart disease Stacey Huerta was given approximately 15 minutes of coronary artery disease prevention counseling today. She is 44 y.o. female and has risk factors for heart disease including obesity. We discussed intensive lifestyle modifications today with an emphasis on specific weight loss instructions and strategies.   Repetitive spaced learning was employed today to elicit superior memory formation and behavioral change.  4. Class 3 severe obesity with serious comorbidity and body mass index (BMI) of 40.0 to 44.9 in adult, unspecified obesity type (  Stacey Huerta) Stacey Huerta is currently in the action stage of change. As such, her goal is to continue with weight loss efforts. She has agreed to keeping a food journal and adhering to recommended goals of 1700 calories and 115 grams of protein.   Exercise goals: As is.  Behavioral modification  strategies: meal planning and cooking strategies and travel eating strategies.  Stacey Huerta has agreed to follow-up with our clinic in 3 weeks. She was informed of the importance of frequent follow-up visits to maximize her success with intensive lifestyle modifications for her multiple health conditions.   Objective:   Blood pressure 94/66, pulse 75, temperature 98.2 F (36.8 C), temperature source Oral, height 5\' 10"  (1.778 m), weight (!) 310 lb (140.6 kg), SpO2 100 %. Body mass index is 44.48 kg/m.  General: Cooperative, alert, well developed, in no acute distress. HEENT: Conjunctivae and lids unremarkable. Cardiovascular: Regular rhythm.  Lungs: Normal work of breathing. Neurologic: No focal deficits.   Lab Results  Component Value Date   CREATININE 1.03 (H) 05/12/2020   BUN 12 05/12/2020   NA 141 05/12/2020   K 3.9 05/12/2020   CL 103 05/12/2020   CO2 27 05/12/2020   Lab Results  Component Value Date   ALT 11 05/12/2020   AST 13 05/12/2020   ALKPHOS 92 05/12/2020   BILITOT 0.3 05/12/2020   Lab Results  Component Value Date   HGBA1C 5.5 05/12/2020   HGBA1C 5.7 12/08/2019   HGBA1C 5.0 10/01/2019   HGBA1C 5.0 03/05/2019   HGBA1C 4.9 10/07/2018   Lab Results  Component Value Date   INSULIN 27.5 (H) 05/12/2020   INSULIN 18.0 10/01/2019   INSULIN 21.8 03/05/2019   INSULIN 10.5 10/07/2018   INSULIN 15.4 06/26/2018   Lab Results  Component Value Date   TSH 1.94 12/08/2019   Lab Results  Component Value Date   CHOL 152 05/12/2020   HDL 43 05/12/2020   LDLCALC 92 05/12/2020   TRIG 90 05/12/2020   CHOLHDL 3.5 05/12/2020   Lab Results  Component Value Date   WBC 8.2 12/17/2019   HGB 13.0 12/17/2019   HCT 38.9 12/17/2019   MCV 98.4 12/17/2019   PLT 186.0 12/17/2019   Lab Results  Component Value Date   IRON 55 12/17/2019   FERRITIN 127.2 12/17/2019   Attestation Statements:   Reviewed by clinician on day of visit: allergies, medications, problem list,  medical history, surgical history, family history, social history, and previous encounter notes.  I, Water quality scientist, CMA, am acting as transcriptionist for Abby Potash, PA-C  I have reviewed the above documentation for accuracy and completeness, and I agree with the above. Abby Potash, PA-C

## 2020-07-20 ENCOUNTER — Encounter (INDEPENDENT_AMBULATORY_CARE_PROVIDER_SITE_OTHER): Payer: Self-pay | Admitting: Physician Assistant

## 2020-07-20 ENCOUNTER — Other Ambulatory Visit: Payer: Self-pay

## 2020-07-20 ENCOUNTER — Ambulatory Visit (INDEPENDENT_AMBULATORY_CARE_PROVIDER_SITE_OTHER): Payer: No Typology Code available for payment source | Admitting: Physician Assistant

## 2020-07-20 VITALS — BP 119/55 | HR 65 | Temp 98.1°F | Ht 70.0 in | Wt 313.0 lb

## 2020-07-20 DIAGNOSIS — Z6841 Body Mass Index (BMI) 40.0 and over, adult: Secondary | ICD-10-CM | POA: Diagnosis not present

## 2020-07-20 DIAGNOSIS — R7303 Prediabetes: Secondary | ICD-10-CM

## 2020-07-20 DIAGNOSIS — E559 Vitamin D deficiency, unspecified: Secondary | ICD-10-CM

## 2020-07-20 NOTE — Progress Notes (Signed)
Chief Complaint:   OBESITY Stacey Huerta is here to discuss her progress with her obesity treatment plan along with follow-up of her obesity related diagnoses. Stacey Huerta is on keeping a food journal and adhering to recommended goals of 1700 calories and 115 grams of protein daily and states she is following her eating plan approximately 75% of the time. Stacey Huerta states she is doing [employer] for 60 minutes 5 times per week.  Today's visit was #: 33 Starting weight: 290 lbs Starting date: 06/26/2018 Today's weight: 313 lbs Today's date: 07/20/2020 Total lbs lost to date: 0 Total lbs lost since last in-office visit: 0  Interim History: Stacey Huerta was in [place] and went to see her mom recently and did not consistently journal her food. Since returning she has been getting between 1600-1700 calories and 100 grams of protein daily.  Subjective:   1. Pre-diabetes Stacey Huerta is on Victoza and metformin. She reports some polyphagia intermittently. Victoza was prescribed by her fertility specialist.  2. Vitamin D deficiency Stacey Huerta is on Vit D weekly, and her energy is good most days.  Assessment/Plan:   1. Pre-diabetes Stacey Huerta will continue her medications, and will continue to work on weight loss, exercise, and decreasing simple carbohydrates to help decrease the risk of diabetes.   2. Vitamin D deficiency Low Vitamin D level contributes to fatigue and are associated with obesity, breast, and colon cancer. Stacey Huerta agreed to continue taking prescription Vitamin D 50,000 IU every week and will follow-up for routine testing of Vitamin D, at least 2-3 times per year to avoid over-replacement.  3. Class 3 severe obesity with serious comorbidity and body mass index (BMI) of 45.0 to 49.9 in adult, unspecified obesity type ([facility]) Stacey Huerta is currently in the action stage of change. As such, her goal is to continue with weight loss efforts. She has agreed to keeping a food journal and adhering to recommended  goals of 1600-1800 calories and 115 grams of protein daily.   Exercise goals: As is.  Behavioral modification strategies: meal planning and cooking strategies and keeping healthy foods in the home.  Stacey Huerta has agreed to follow-up with our clinic in 2 weeks. She was informed of the importance of frequent follow-up visits to maximize her success with intensive lifestyle modifications for her multiple health conditions.   Objective:   Blood pressure (!) 119/55, pulse 65, temperature 98.1 F (36.7 C), height 5\' 10"  (1.778 m), weight (!) 313 lb (142 kg), SpO2 100 %. Body mass index is 44.91 kg/m.  General: Cooperative, alert, well developed, in no acute distress. HEENT: Conjunctivae and lids unremarkable. Cardiovascular: Regular rhythm.  Lungs: Normal work of breathing. Neurologic: No focal deficits.   Lab Results  Component Value Date   CREATININE 1.03 (H) 05/12/2020   BUN 12 05/12/2020   NA 141 05/12/2020   K 3.9 05/12/2020   CL 103 05/12/2020   CO2 27 05/12/2020   Lab Results  Component Value Date   ALT 11 05/12/2020   AST 13 05/12/2020   ALKPHOS 92 05/12/2020   BILITOT 0.3 05/12/2020   Lab Results  Component Value Date   HGBA1C 5.5 05/12/2020   HGBA1C 5.7 12/08/2019   HGBA1C 5.0 10/01/2019   HGBA1C 5.0 03/05/2019   HGBA1C 4.9 10/07/2018   Lab Results  Component Value Date   INSULIN 27.5 (H) 05/12/2020   INSULIN 18.0 10/01/2019   INSULIN 21.8 03/05/2019   INSULIN 10.5 10/07/2018   INSULIN 15.4 06/26/2018   Lab Results  Component Value  Date   TSH 1.94 12/08/2019   Lab Results  Component Value Date   CHOL 152 05/12/2020   HDL 43 05/12/2020   LDLCALC 92 05/12/2020   TRIG 90 05/12/2020   CHOLHDL 3.5 05/12/2020   Lab Results  Component Value Date   WBC 8.2 12/17/2019   HGB 13.0 12/17/2019   HCT 38.9 12/17/2019   MCV 98.4 12/17/2019   PLT 186.0 12/17/2019   Lab Results  Component Value Date   IRON 55 12/17/2019   FERRITIN 127.2 12/17/2019    Attestation Statements:   Reviewed by clinician on day of visit: allergies, medications, problem list, medical history, surgical history, family history, social history, and previous encounter notes.  Time spent on visit including pre-visit chart review and post-visit care and charting was 30 minutes.    Wilhemena Durie, am acting as transcriptionist for [employer], PA-C.  I have reviewed the above documentation for accuracy and completeness, and I agree with the above. Abby Potash, PA-C

## 2020-07-31 ENCOUNTER — Other Ambulatory Visit (INDEPENDENT_AMBULATORY_CARE_PROVIDER_SITE_OTHER): Payer: Self-pay | Admitting: Physician Assistant

## 2020-07-31 DIAGNOSIS — E559 Vitamin D deficiency, unspecified: Secondary | ICD-10-CM

## 2020-08-01 ENCOUNTER — Encounter (INDEPENDENT_AMBULATORY_CARE_PROVIDER_SITE_OTHER): Payer: Self-pay

## 2020-08-01 NOTE — Telephone Encounter (Signed)
Message sent to pt.

## 2020-08-04 ENCOUNTER — Ambulatory Visit (INDEPENDENT_AMBULATORY_CARE_PROVIDER_SITE_OTHER): Payer: No Typology Code available for payment source | Admitting: Physician Assistant

## 2020-08-04 ENCOUNTER — Encounter (INDEPENDENT_AMBULATORY_CARE_PROVIDER_SITE_OTHER): Payer: Self-pay | Admitting: Physician Assistant

## 2020-08-04 ENCOUNTER — Other Ambulatory Visit: Payer: Self-pay

## 2020-08-04 VITALS — BP 111/74 | HR 94 | Temp 98.7°F | Ht 70.0 in | Wt 307.0 lb

## 2020-08-04 DIAGNOSIS — E559 Vitamin D deficiency, unspecified: Secondary | ICD-10-CM | POA: Diagnosis not present

## 2020-08-04 DIAGNOSIS — Z9189 Other specified personal risk factors, not elsewhere classified: Secondary | ICD-10-CM | POA: Diagnosis not present

## 2020-08-04 DIAGNOSIS — R7303 Prediabetes: Secondary | ICD-10-CM

## 2020-08-04 DIAGNOSIS — Z6841 Body Mass Index (BMI) 40.0 and over, adult: Secondary | ICD-10-CM

## 2020-08-04 MED ORDER — VITAMIN D (ERGOCALCIFEROL) 1.25 MG (50000 UNIT) PO CAPS: 50000.0000 [IU] | ORAL_CAPSULE | ORAL | 0 refills | Status: DC

## 2020-08-04 NOTE — Progress Notes (Signed)
Chief Complaint:   OBESITY Stacey Huerta is here to discuss her progress with her obesity treatment plan along with follow-up of her obesity related diagnoses. 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 [name] states she is doing [employer] 60 minutes 5 times per week.  Today's visit was #: 107 Starting weight: 290 lbs Starting date: 06/26/2018 Today's weight: 307 lbs Today's date: 08/04/2020 Total lbs lost to date: 0 Total lbs lost since last in-office visit: 6  Interim History: Stacey Huerta states that she is getting 1600-1700 calories and 100 grams or more of protein daily. She has trouble eating 1800 calories because she is not hungry.  Subjective:   Vitamin D deficiency. Scott is on prescription Vitamin D weekly. Energy is reported to be better.   Ref. Range 05/12/2020 13:27  Vitamin D, 25-Hydroxy Latest Ref Range: 30.0 - 100.0 ng/mL 51.2   Prediabetes. Stacey Huerta has a diagnosis of prediabetes based on her elevated HgA1c and was informed this puts her at greater risk of developing diabetes. She continues to work on diet and exercise to decrease her risk of diabetes. She denies nausea or hypoglycemia. Stacey Huerta is on metformin XR, which she is tolerating well.  Lab Results  Component Value Date   HGBA1C 5.5 05/12/2020   Lab Results  Component Value Date   INSULIN 27.5 (H) 05/12/2020   INSULIN 18.0 10/01/2019   INSULIN 21.8 03/05/2019   INSULIN 10.5 10/07/2018   INSULIN 15.4 06/26/2018   At risk for hypoglycemia. [name] is at increased risk for hypoglycemia due to changes in diet, diagnosis of diabetes, and/or insulin use.   Assessment/Plan:   Vitamin D deficiency. Low Vitamin D level contributes to fatigue and are associated with obesity, breast, and colon cancer. She was given a refill on her Vitamin D, Ergocalciferol,  (DRISDOL) 1.25 MG (50000 UNIT) CAPS capsule every week #4 with 0 refills and will follow-up for routine testing of Vitamin D, at least 2-3 times per year to avoid over-replacement.   Prediabetes. Stacey Huerta will continue to work on weight loss, exercise, and decreasing simple carbohydrates to help decrease the risk of diabetes. She will continue metformin as directed.   At risk for hypoglycemia. Stacey Huerta was given approximately 15 minutes of counseling today regarding prevention of hypoglycemia. She was advised of symptoms of hypoglycemia. [name] was instructed to avoid skipping meals, eat regular protein rich meals and schedule low calorie snacks as needed.   Repetitive spaced learning was employed today to elicit superior memory formation and behavioral change.  Class 3 severe obesity with serious comorbidity and body mass index (BMI) of 40.0 to 44.9 in adult, unspecified obesity type ([place]).  Gracin is currently in the action stage of change. As such, her goal is to continue with weight loss efforts. She has agreed to keeping a food journal and adhering to recommended goals of 1700-1800 calories and 115 grams of protein daily.   Exercise goals: For substantial health benefits, adults should do at least 150 minutes (2 hours and 30 minutes) a week of moderate-intensity, or 75 minutes (1 hour and 15 minutes) a week of vigorous-intensity aerobic physical activity, or an equivalent combination of moderate- and vigorous-intensity aerobic activity. Aerobic activity should be performed in episodes of at least 10 minutes, and preferably, it should be spread throughout the week.  Behavioral modification strategies: meal planning  and cooking strategies and keeping healthy foods in the home.  Stacey Huerta has agreed to follow-up with our clinic in 2 weeks. She was informed of the importance of frequent follow-up visits to maximize her success with intensive lifestyle modifications for her multiple health conditions.     Objective:   Blood pressure 111/74, pulse 94, temperature 98.7 F (37.1 C), height 5\' 10"  (1.778 m), weight (!) 307 lb (139.3 kg), SpO2 100 %. Body mass index is 44.05 kg/m.  General: Cooperative, alert, well developed, in no acute distress. HEENT: Conjunctivae and lids unremarkable. Cardiovascular: Regular rhythm.  Lungs: Normal work of breathing. Neurologic: No focal deficits.   Lab Results  Component Value Date   CREATININE 1.03 (H) 05/12/2020   BUN 12 05/12/2020   NA 141 05/12/2020   K 3.9 05/12/2020   CL 103 05/12/2020   CO2 27 05/12/2020   Lab Results  Component Value Date   ALT 11 05/12/2020   AST 13 05/12/2020   ALKPHOS 92 05/12/2020   BILITOT 0.3 05/12/2020   Lab Results  Component Value Date   HGBA1C 5.5 05/12/2020   HGBA1C 5.7 12/08/2019   HGBA1C 5.0 10/01/2019   HGBA1C 5.0 03/05/2019   HGBA1C 4.9 10/07/2018   Lab Results  Component Value Date   INSULIN 27.5 (H) 05/12/2020   INSULIN 18.0 10/01/2019   INSULIN 21.8 03/05/2019   INSULIN 10.5 10/07/2018   INSULIN 15.4 06/26/2018   Lab Results  Component Value Date   TSH 1.94 12/08/2019   Lab Results  Component Value Date   CHOL 152 05/12/2020   HDL 43 05/12/2020   LDLCALC 92 05/12/2020   TRIG 90 05/12/2020   CHOLHDL 3.5 05/12/2020   Lab Results  Component Value Date   WBC 8.2 12/17/2019   HGB 13.0 12/17/2019   HCT 38.9 12/17/2019   MCV 98.4 12/17/2019   PLT 186.0 12/17/2019   Lab Results  Component Value Date   IRON 55 12/17/2019   FERRITIN 127.2 12/17/2019   Attestation Statements:   Reviewed by clinician on day of visit: allergies, medications, problem list, medical history, surgical history, family history, social history, and previous encounter notes.  IMichaelene Song, am acting as transcriptionist for Abby Potash, PA-C   I have reviewed the above documentation for accuracy and completeness, and I agree with the above. Abby Potash, PA-C

## 2020-08-18 ENCOUNTER — Ambulatory Visit (INDEPENDENT_AMBULATORY_CARE_PROVIDER_SITE_OTHER): Payer: No Typology Code available for payment source | Admitting: Physician Assistant

## 2020-08-18 ENCOUNTER — Other Ambulatory Visit: Payer: Self-pay

## 2020-08-18 ENCOUNTER — Encounter (INDEPENDENT_AMBULATORY_CARE_PROVIDER_SITE_OTHER): Payer: Self-pay | Admitting: Physician Assistant

## 2020-08-18 VITALS — BP 114/72 | HR 100 | Temp 98.1°F | Ht 70.0 in | Wt 308.0 lb

## 2020-08-18 DIAGNOSIS — Z9189 Other specified personal risk factors, not elsewhere classified: Secondary | ICD-10-CM

## 2020-08-18 DIAGNOSIS — Z6841 Body Mass Index (BMI) 40.0 and over, adult: Secondary | ICD-10-CM

## 2020-08-18 DIAGNOSIS — E7849 Other hyperlipidemia: Secondary | ICD-10-CM | POA: Diagnosis not present

## 2020-08-18 DIAGNOSIS — E559 Vitamin D deficiency, unspecified: Secondary | ICD-10-CM | POA: Diagnosis not present

## 2020-08-18 MED ORDER — VITAMIN D (ERGOCALCIFEROL) 1.25 MG (50000 UNIT) PO CAPS: 50000.0000 [IU] | ORAL_CAPSULE | ORAL | 0 refills | Status: DC

## 2020-08-18 NOTE — Progress Notes (Signed)
Chief Complaint:   OBESITY Stacey Huerta is here to discuss her progress with her obesity treatment plan along with follow-up of her obesity related diagnoses. 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 Stacey Huerta states she is doing [employer] 60 minutes 5 times per week.  Today's visit was #: 64 Starting weight: 290 lbs Starting date: 06/26/2018 Today's weight: 308 lbs Today's date: 08/18/2020 Total lbs lost to date: 0 Total lbs lost since last in-office visit: 0  Interim History: Stacey Huerta is averaging 1700 calories and about 100 grams of protein. She is very frustrated with lack of weight loss. She reports drinking a lot of water and exercising regularly. She is traveling to [place] in 2 weeks.  Subjective:   Vitamin D deficiency. Stacey Huerta is on prescription Vitamin D. No nausea, vomiting, or muscle weakness. Last level was at goal.   Ref. Range 05/12/2020 13:27  Vitamin D, 25-Hydroxy Latest Ref Range: 30.0 - 100.0 ng/mL 51.2   Other hyperlipidemia. Last LDL 101. Stacey Huerta is on no medication. No chest pain. She is exercising regularly.   Lab Results  Component Value Date   CHOL 152 05/12/2020   HDL 43 05/12/2020   LDLCALC 92 05/12/2020   TRIG 90 05/12/2020   CHOLHDL 3.5 05/12/2020   Lab Results  Component Value Date   ALT 11 05/12/2020   AST 13 05/12/2020   ALKPHOS 92 05/12/2020   BILITOT 0.3 05/12/2020   The 10-year ASCVD risk score Stacey Huerta DC Jr., et al., 2013) is: 0.9%   Values used to calculate the score:     Age: 44 years     Sex: Female     Is Non-Hispanic African American: Yes     Diabetic: No     Tobacco smoker: No     Systolic Blood Pressure: 287 mmHg     Is BP treated: No     HDL Cholesterol: 43 mg/dL     Total Cholesterol: 152 mg/dL  At risk for heart disease. Stacey Huerta is at a higher than average risk for  cardiovascular disease due to obesity.   Assessment/Plan:   Vitamin D deficiency. Low Vitamin D level contributes to fatigue and are associated with obesity, breast, and colon cancer. She was given a refill on her Vitamin D, Ergocalciferol, (DRISDOL) 1.25 MG (50000 UNIT) CAPS capsule every week #4 with 0 refills and will follow-up for routine testing of Vitamin D, at least 2-3 times per year to avoid over-replacement.   Other hyperlipidemia. Cardiovascular risk and specific lipid/LDL goals reviewed.  We discussed several lifestyle modifications today and Stacey Huerta will continue to work on diet, exercise and weight loss efforts. Orders and follow up as documented in patient record.   Counseling Intensive lifestyle modifications are the first line treatment for this issue. . Dietary changes: Increase soluble fiber. Decrease simple carbohydrates. . Exercise changes: Moderate to vigorous-intensity aerobic activity 150 minutes per week if tolerated. . Lipid-lowering medications: see documented in medical record.  At risk for heart disease. Stacey Huerta was given approximately 15 minutes of coronary artery disease prevention counseling today. She is 44 y.o. female and has risk factors for heart disease including obesity. We discussed intensive lifestyle modifications today with an emphasis on specific weight loss instructions and strategies.   Repetitive spaced learning was employed today to elicit superior memory formation and behavioral change.  Class 3 severe obesity with serious comorbidity and body mass index (BMI) of 40.0 to 44.9 in adult, unspecified obesity type ([name]).  Stacey Huerta is currently in the action stage of change. As such, her goal is to continue with weight loss efforts. She has agreed to keeping a food journal and adhering to recommended goals of 1500-1700 calories and 100 grams of protein daily.   Labs will be checked at her next visit.   Exercise goals: For substantial health benefits,  adults should do at least 150 minutes (2 hours and 30 minutes) a week of moderate-intensity, or 75 minutes (1 hour and 15 minutes) a week of vigorous-intensity aerobic physical activity, or an equivalent combination of moderate- and vigorous-intensity aerobic activity. Aerobic activity should be performed in episodes of at least 10 minutes, and preferably, it should be spread throughout the week.  Behavioral modification strategies: increasing lean protein intake and planning for success.  Stacey Huerta has agreed to follow-up with our clinic in 6 weeks. She was informed of the importance of frequent follow-up visits to maximize her success with intensive lifestyle modifications for her multiple health conditions.   Objective:   Blood pressure 114/72, pulse 100, temperature 98.1 F (36.7 C), temperature source Oral, height 5\' 10"  (1.778 m), weight (!) 308 lb (139.7 kg), last menstrual period 07/12/2020, SpO2 98 %. Body mass index is 44.19 kg/m.  General: Cooperative, alert, well developed, in no acute distress. HEENT: Conjunctivae and lids unremarkable. Cardiovascular: Regular rhythm.  Lungs: Normal work of breathing. Neurologic: No focal deficits.   Lab Results  Component Value Date   CREATININE 1.03 (H) 05/12/2020   BUN 12 05/12/2020   NA 141 05/12/2020   K 3.9 05/12/2020   CL 103 05/12/2020   CO2 27 05/12/2020   Lab Results  Component Value Date   ALT 11 05/12/2020   AST 13 05/12/2020   ALKPHOS 92 05/12/2020   BILITOT 0.3 05/12/2020   Lab Results  Component Value Date   HGBA1C 5.5 05/12/2020   HGBA1C 5.7 12/08/2019   HGBA1C 5.0 10/01/2019   HGBA1C 5.0 03/05/2019   HGBA1C 4.9 10/07/2018   Lab Results  Component Value Date   INSULIN 27.5 (H) 05/12/2020   INSULIN 18.0 10/01/2019   INSULIN 21.8 03/05/2019   INSULIN 10.5 10/07/2018   INSULIN 15.4 06/26/2018   Lab Results  Component Value Date   TSH 1.94 12/08/2019   Lab Results  Component Value Date   CHOL 152  05/12/2020   HDL 43 05/12/2020   LDLCALC 92 05/12/2020   TRIG 90 05/12/2020   CHOLHDL 3.5 05/12/2020   Lab Results  Component Value Date   WBC 8.2 12/17/2019   HGB 13.0 12/17/2019   HCT 38.9 12/17/2019   MCV 98.4 12/17/2019   PLT 186.0 12/17/2019   Lab Results  Component Value Date   IRON 55 12/17/2019   FERRITIN 127.2 12/17/2019   Attestation Statements:   Reviewed by clinician on day of visit: allergies, medications, problem list, medical history, surgical history, family history, social history, and previous encounter notes.  IMichaelene Song, am acting as transcriptionist for Abby Potash, PA-C   I have reviewed the above documentation for accuracy and completeness, and I agree with the above. Abby Potash, PA-C

## 2020-09-14 ENCOUNTER — Other Ambulatory Visit (INDEPENDENT_AMBULATORY_CARE_PROVIDER_SITE_OTHER): Payer: Self-pay | Admitting: Physician Assistant

## 2020-09-14 DIAGNOSIS — E559 Vitamin D deficiency, unspecified: Secondary | ICD-10-CM

## 2020-09-29 ENCOUNTER — Ambulatory Visit (INDEPENDENT_AMBULATORY_CARE_PROVIDER_SITE_OTHER): Payer: No Typology Code available for payment source | Admitting: Family Medicine

## 2020-10-22 HISTORY — PX: BUNIONECTOMY: SHX129

## 2020-11-01 ENCOUNTER — Encounter: Payer: Self-pay | Admitting: Family Medicine

## 2020-11-01 ENCOUNTER — Ambulatory Visit: Payer: No Typology Code available for payment source | Admitting: Family Medicine

## 2020-11-01 ENCOUNTER — Other Ambulatory Visit: Payer: Self-pay

## 2020-11-01 VITALS — BP 120/88 | HR 96 | Temp 98.2°F | Ht 70.0 in | Wt 318.0 lb

## 2020-11-01 DIAGNOSIS — E8881 Metabolic syndrome: Secondary | ICD-10-CM

## 2020-11-01 DIAGNOSIS — Z23 Encounter for immunization: Secondary | ICD-10-CM | POA: Diagnosis not present

## 2020-11-01 DIAGNOSIS — Z01818 Encounter for other preprocedural examination: Secondary | ICD-10-CM

## 2020-11-01 MED ORDER — OZEMPIC (0.25 OR 0.5 MG/DOSE) 2 MG/1.5ML ~~LOC~~ SOPN: 0.5000 mg | PEN_INJECTOR | SUBCUTANEOUS | 2 refills | Status: DC

## 2020-11-01 NOTE — Progress Notes (Signed)
Subjective:    Stacey Huerta is a 45 y.o. female who presents to the office today for a preoperative consultation at the request of surgeon Dr Clovia Cuff who plans on performing R bunion surgery  on January 26. This consultation is requested for the specific conditions prompting preoperative evaluation (i.e. because of potential affect on operative risk): insulin resistance . Planned anesthesia: general. The patient has the following known anesthesia issues: no problems . Patients bleeding risk: no recent abnormal bleeding. The following portions of the patient's history were reviewed and updated as appropriate:  She  has a past medical history of Allergy, Back pain, Dyspnea, Fibroid, Headache(784.0), Hypertension during pregnancy, Infertility, female, Leg edema, Osteoarthritis, Postpartum depression, Sjogren's syndrome ([place]) (10/28/2015), Vitamin B 12 deficiency, and Vitamin D deficiency. She does not have any pertinent problems on file. She  has a past surgical history that includes Cholecystectomy; Myomectomy; Myomectomy (05/23/2012); Lymphadenectomy; Abdominal adhesion surgery; Endometrial ablation; and Cesarean section (N/A, 03/18/2014). Her family history includes Arthritis in her paternal aunt; Breast cancer in her maternal aunt; Colon cancer in her paternal grandmother; Diabetes in her father, paternal grandfather, and paternal grandmother; Hyperlipidemia in her maternal aunt and mother; Hypertension in her paternal grandfather; Hypotension in her father; Obesity in her mother; Prostate cancer in her maternal grandfather. She  reports that she has never smoked. She has never used smokeless tobacco. She reports current alcohol use. She reports that she does not use drugs. She has a current medication list which includes the following prescription(s): aspirin ec, b complex-c-folic acid, magnesium, metformin, fish oil, probiotic product, ozempic (0.25 or 0.5 mg/dose), vitamin d (ergocalciferol), vitamin  e, and zinc gluconate. Current Outpatient Medications on File Prior to Visit  Medication Sig Dispense Refill  . aspirin EC 81 MG tablet Take 1 tablet (81 mg total) by mouth daily.    . B Complex-C-Folic Acid (B-COMPLEX BALANCED PO) Take by mouth.    Marland Kitchen MAGNESIUM PO Take 1 tablet by mouth daily.    . metFORMIN (GLUCOPHAGE XR) 500 MG 24 hr tablet Take 1 tablet (500 mg total) by mouth daily with breakfast. 90 tablet 0  . Omega-3 Fatty Acids (FISH OIL) 1000 MG CAPS Take by mouth.    . Probiotic Product (PROBIOTIC-10 PO) Take by mouth.    . Vitamin D, Ergocalciferol, (DRISDOL) 1.25 MG (50000 UNIT) CAPS capsule Take 1 capsule (50,000 Units total) by mouth every 7 (seven) days. 4 capsule 0  . VITAMIN E PO Take 1 tablet by mouth daily.    Marland Kitchen zinc gluconate 50 MG tablet Take 50 mg by mouth daily.     No current facility-administered medications on file prior to visit.   She is allergic to eggs or egg-derived products, metformin and related, sulfonamide derivatives, ganoderma lucidum, maitake, other, penicillins, shellfish allergy, and fish-derived products..  Review of Systems Review of Systems  Constitutional: Negative for activity change, appetite change and fatigue.  HENT: Negative for hearing loss, congestion, tinnitus and ear discharge.  dentist q35m Eyes: Negative for visual disturbance (see optho q1y -- vision corrected to 20/20 with glasses).  Respiratory: Negative for cough, chest tightness and shortness of breath.   Cardiovascular: Negative for chest pain, palpitations and leg swelling.  Gastrointestinal: Negative for abdominal pain, diarrhea, constipation and abdominal distention.  Genitourinary: Negative for urgency, frequency, decreased urine volume and difficulty urinating.  Musculoskeletal: Negative for back pain, arthralgias and gait problem.  Skin: Negative for color change, pallor and rash.  Neurological: Negative for dizziness, light-headedness, numbness and  headaches.   Hematological: Negative for adenopathy. Does not bruise/bleed easily.  Psychiatric/Behavioral: Negative for suicidal ideas, confusion, sleep disturbance, self-injury, dysphoric mood, decreased concentration and agitation.       Objective    BP 120/88 (BP Location: Right Arm, Patient Position: Sitting, Cuff Size: Large)   Pulse 96   Temp 98.2 F (36.8 C) (Oral)   Ht 5\' 10"  (1.778 m)   Wt (!) 318 lb (144.2 kg)   SpO2 99%   BMI 45.63 kg/m  General appearance: alert, cooperative, appears stated age and no distress Head: Normocephalic, without obvious abnormality, atraumatic Eyes: negative findings: lids and lashes normal, conjunctivae and sclerae normal and pupils equal, round, reactive to light and accomodation Ears: normal TM's and external ear canals both ears Neck: no adenopathy, no carotid bruit, no JVD, supple, symmetrical, trachea midline and thyroid not enlarged, symmetric, no tenderness/mass/nodules Back: symmetric, no curvature. ROM normal. No CVA tenderness. Lungs: clear to auscultation bilaterally Heart: regular rate and rhythm, S1, S2 normal, no murmur, click, rub or gallop Abdomen: soft, non-tender; bowel sounds normal; no masses,  no organomegaly Extremities: extremities normal, atraumatic, no cyanosis or edema Pulses: 2+ and symmetric Skin: Skin color, texture, turgor normal. No rashes or lesions Lymph nodes: Cervical, supraclavicular, and axillary nodes normal. Neurologic: Alert and oriented X 3, normal strength and tone. Normal symmetric reflexes. Normal coordination and gait     Cardiographics ECG: sinus,  1 degree av block-- not new  Echocardiogram: not done  Imaging Chest x-ray: not done   Lab Review  Office Visit on 05/12/2020  Component Date Value  . Vit D, 25-Hydroxy 05/12/2020 51.2   . Cholesterol, Total 05/12/2020 152   . Triglycerides 05/12/2020 90   . HDL 05/12/2020 43   . VLDL Cholesterol Cal 05/12/2020 17   . LDL Chol Calc (NIH) 05/12/2020  92   . Chol/HDL Ratio 05/12/2020 3.5   . INSULIN 05/12/2020 27.5*  . Hgb A1c MFr Bld 05/12/2020 5.5   . Est. average glucose Bld* 05/12/2020 111   . Glucose 05/12/2020 88   . BUN 05/12/2020 12   . Creatinine, Ser 05/12/2020 1.03*  . GFR calc non Af Amer 05/12/2020 65   . GFR calc Af Amer 05/12/2020 75   . BUN/Creatinine Ratio 05/12/2020 12   . Sodium 05/12/2020 141   . Potassium 05/12/2020 3.9   . Chloride 05/12/2020 103   . CO2 05/12/2020 27   . Calcium 05/12/2020 8.8   . Total Protein 05/12/2020 6.7   . Albumin 05/12/2020 3.4*  . Globulin, Total 05/12/2020 3.3   . Albumin/Globulin Ratio 05/12/2020 1.0*  . Bilirubin Total 05/12/2020 0.3   . Alkaline Phosphatase 05/12/2020 92   . AST 05/12/2020 13   . ALT 05/12/2020 11       Assessment:      45 y.o. female with planned surgery as above.     Cardiac Risk Estimation: low    Plan:    1. Preoperative workup as follows done by surgeons office . 2. Change in medication regimen before surgery: per surgical team. 3. Prophylaxis for cardiac events with perioperative beta-blockers: not indicated. 4. Invasive hemodynamic monitoring perioperatively: not indicated. 5. Deep vein thrombosis prophylaxis postoperatively:regimen to be chosen by surgical team. 6. Surveillance for postoperative MI with ECG immediately postoperatively and on postoperative days 1 and 2 AND troponin levels 24 hours postoperatively and on day 4 or hospital discharge (whichever comes first): not indicated.  1. Preop examination Labs done yesterday by surgical team  -  EKG 12-Lead - Lipid panel - CBC with Differential/Platelet - TSH - Comprehensive metabolic panel - Insulin, random  2. Insulin resistance   - Semaglutide,0.25 or 0.5MG /DOS, (OZEMPIC, 0.25 OR 0.5 MG/DOSE,) 2 MG/1.5ML SOPN; Inject 0.5 mg into the skin once a week.  Dispense: 1.5 mL; Refill: 2  3. Need for tetanus booster   - Tdap vaccine greater than or equal to 7yo IM  4. Morbid  obesity ([place]) Pt has struggled with weight loss --- ozempic should help She is considering weight loss surgery  - Lipid panel - CBC with Differential/Platelet - TSH - Comprehensive metabolic panel - Insulin, random

## 2020-11-22 ENCOUNTER — Encounter: Payer: Self-pay | Admitting: Family Medicine

## 2020-11-23 NOTE — Addendum Note (Signed)
Addended by: Sanda Linger on: 11/23/2020 08:56 AM   Modules accepted: Orders

## 2020-11-23 NOTE — Telephone Encounter (Signed)
Labs were not done at last visit They need to be done -- then if appropriate we can look into ozempic

## 2022-05-30 ENCOUNTER — Encounter (INDEPENDENT_AMBULATORY_CARE_PROVIDER_SITE_OTHER): Payer: Self-pay

## 2022-11-01 ENCOUNTER — Ambulatory Visit (INDEPENDENT_AMBULATORY_CARE_PROVIDER_SITE_OTHER): Payer: No Typology Code available for payment source | Admitting: Family Medicine

## 2022-11-01 ENCOUNTER — Encounter: Payer: Self-pay | Admitting: Family Medicine

## 2022-11-01 VITALS — BP 128/88 | HR 103 | Temp 98.8°F | Resp 18 | Ht 70.0 in | Wt 318.4 lb

## 2022-11-01 DIAGNOSIS — M3500 Sicca syndrome, unspecified: Secondary | ICD-10-CM

## 2022-11-01 DIAGNOSIS — E538 Deficiency of other specified B group vitamins: Secondary | ICD-10-CM

## 2022-11-01 DIAGNOSIS — Z0000 Encounter for general adult medical examination without abnormal findings: Secondary | ICD-10-CM

## 2022-11-01 DIAGNOSIS — E559 Vitamin D deficiency, unspecified: Secondary | ICD-10-CM | POA: Diagnosis not present

## 2022-11-01 DIAGNOSIS — F32A Depression, unspecified: Secondary | ICD-10-CM

## 2022-11-01 DIAGNOSIS — E88819 Insulin resistance, unspecified: Secondary | ICD-10-CM

## 2022-11-01 DIAGNOSIS — R739 Hyperglycemia, unspecified: Secondary | ICD-10-CM

## 2022-11-01 DIAGNOSIS — R41840 Attention and concentration deficit: Secondary | ICD-10-CM

## 2022-11-01 HISTORY — DX: Deficiency of other specified B group vitamins: E53.8

## 2022-11-01 HISTORY — DX: Attention and concentration deficit: R41.840

## 2022-11-01 HISTORY — DX: Encounter for general adult medical examination without abnormal findings: Z00.00

## 2022-11-01 HISTORY — DX: Depression, unspecified: F32.A

## 2022-11-01 LAB — CBC WITH DIFFERENTIAL/PLATELET
Basophils Absolute: 0 10*3/uL (ref 0.0–0.1)
Basophils Relative: 0.6 % (ref 0.0–3.0)
Eosinophils Absolute: 0.1 10*3/uL (ref 0.0–0.7)
Eosinophils Relative: 1.9 % (ref 0.0–5.0)
HCT: 40 % (ref 36.0–46.0)
Hemoglobin: 13.5 g/dL (ref 12.0–15.0)
Lymphocytes Relative: 29.3 % (ref 12.0–46.0)
Lymphs Abs: 1.4 10*3/uL (ref 0.7–4.0)
MCHC: 33.8 g/dL (ref 30.0–36.0)
MCV: 96.9 fl (ref 78.0–100.0)
Monocytes Absolute: 0.4 10*3/uL (ref 0.1–1.0)
Monocytes Relative: 9.1 % (ref 3.0–12.0)
Neutro Abs: 2.8 10*3/uL (ref 1.4–7.7)
Neutrophils Relative %: 59.1 % (ref 43.0–77.0)
Platelets: 143 10*3/uL — ABNORMAL LOW (ref 150.0–400.0)
RBC: 4.13 Mil/uL (ref 3.87–5.11)
RDW: 13 % (ref 11.5–15.5)
WBC: 4.7 10*3/uL (ref 4.0–10.5)

## 2022-11-01 LAB — LIPID PANEL
Cholesterol: 186 mg/dL (ref 0–200)
HDL: 39 mg/dL — ABNORMAL LOW (ref >39.00)
LDL Cholesterol: 116 mg/dL — ABNORMAL HIGH (ref 0–99)
NonHDL: 147.49
Total CHOL/HDL Ratio: 5
Triglycerides: 155 mg/dL — ABNORMAL HIGH (ref 0.0–149.0)
VLDL: 31 mg/dL (ref 0.0–40.0)

## 2022-11-01 LAB — COMPREHENSIVE METABOLIC PANEL
ALT: 16 U/L (ref 0–35)
AST: 18 U/L (ref 0–37)
Albumin: 3.8 g/dL (ref 3.5–5.2)
Alkaline Phosphatase: 97 U/L (ref 39–117)
BUN: 13 mg/dL (ref 6–23)
CO2: 28 mEq/L (ref 19–32)
Calcium: 9 mg/dL (ref 8.4–10.5)
Chloride: 106 mEq/L (ref 96–112)
Creatinine, Ser: 1.28 mg/dL — ABNORMAL HIGH (ref 0.40–1.20)
GFR: 49.52 mL/min — ABNORMAL LOW (ref >60.00)
Glucose, Bld: 87 mg/dL (ref 70–99)
Potassium: 4.7 mEq/L (ref 3.5–5.1)
Sodium: 139 mEq/L (ref 135–145)
Total Bilirubin: 0.9 mg/dL (ref 0.2–1.2)
Total Protein: 7.4 g/dL (ref 6.0–8.3)

## 2022-11-01 LAB — HEMOGLOBIN A1C: Hgb A1c MFr Bld: 5.2 % (ref 4.6–6.5)

## 2022-11-01 LAB — VITAMIN D 25 HYDROXY (VIT D DEFICIENCY, FRACTURES): VITD: 59.51 ng/mL (ref 30.00–100.00)

## 2022-11-01 LAB — VITAMIN B12: Vitamin B-12: 367 pg/mL (ref 211–911)

## 2022-11-01 MED ORDER — NALTREXONE HCL (PAIN) 4.5 MG PO CAPS: ORAL_CAPSULE | ORAL | 5 refills | Status: DC

## 2022-11-01 NOTE — Progress Notes (Addendum)
Subjective:   By signing my name below, I, Shehryar Baig, attest that this documentation has been prepared under the direction and in the presence of Ann Held, DO. 11/01/2022   Patient ID: Stacey Huerta, female    DOB: [birth year], 47 y.o.   MRN: [id number]  Chief Complaint  Patient presents with   Annual Exam    Pt states not fasting     HPI Patient is in today for a comprehensive physical exam.   She reports having a procedure to have a bunion removed on her foot and suffered an adverse reaction. She could not walk following the procedure for 6 months.  She has no started Ozempic since her last visit due to her pharmacy not having her prescription.  She is requesting to see a rheumatologist to manage her Sjogren syndrome.  Her mood is down. She feels tired and frustrated. Following her foot injury she has traveled and tried to help her mood but nothing lasted. She is planning on going on leave from work. She is requesting a note to help grant her leave. She is trying counseling and had her first session today. Her counselor recommended she see a psychiatrist for potential ADD. Her counselor also thinks she is going through depression. She is interested in seeing a counselor closer to her home. She denies having any depression in her family.  She denies having any fever, new moles, congestion, sore throat, new muscle pain, new joint pain, chest pain, cough, SOB, wheezing, n/v/d, constipation, blood in stool, dysuria, frequency, hematuria, or headaches at this time. She continues taking vitamin D supplements regularly.  She continues following up with her GYN specialist and completes her mammograms at her office. She reports her menstrual cycles are becoming more irregular.  She has no changes to her family medical history. She only drinks socially. She is sexually active with one person. She does not use tobacco products. She does not use drugs.  She continues exercising by  going to the gym 3x weekly.  She does not have the Covid-19 vaccines.   Pap smear: Last completed 09/02/2016.  Mammogram: Last completed 06/17/2019. Results are normal. Repeat in 1 year.  Colonoscopy: Last completed 06/15/2015. Results are normal. Repeat in 10 years.    Past Medical History:  Diagnosis Date   Allergy    Back pain    Dyspnea    Fibroid    Headache(784.0)    Hypertension during pregnancy    Infertility, female    Leg edema    Osteoarthritis    Postpartum depression    Sjogren's syndrome ([place]) 10/28/2015   Vitamin B 12 deficiency    Vitamin D deficiency     Past Surgical History:  Procedure Laterality Date   ABDOMINAL ADHESION SURGERY     CESAREAN SECTION N/A 03/18/2014   Procedure: CESAREAN SECTION;  Surgeon: Delice Lesch, MD;  Location: [facility];  Service: Obstetrics;  Laterality: N/A;  spinal/epidural   CHOLECYSTECTOMY     ENDOMETRIAL ABLATION     LYMPHADENECTOMY     Removed from the neck at age 39   MYOMECTOMY     MYOMECTOMY  05/23/2012   Procedure: MYOMECTOMY;  Surgeon: Governor Specking, MD;  Location: [facility];  Service: Gynecology;  Laterality: N/A;    Family History  Problem Relation Age of Onset   Hypertension Paternal Grandfather    Diabetes Paternal Grandfather    Diabetes Father    Hypotension Father    Colon cancer Paternal  Grandmother    Diabetes Paternal Grandmother    Prostate cancer Maternal Grandfather    Hyperlipidemia Mother    Obesity Mother    Breast cancer Maternal Aunt    Arthritis Paternal Aunt    Hyperlipidemia Maternal Aunt        x's 2   Esophageal cancer Neg Hx    Stomach cancer Neg Hx     Social History   Socioeconomic History   Marital status: Significant Other    Spouse name: Sharol Roussel   Number of children: Not on file   Years of education: Not on file   Highest education level: Not on file  Occupational History   Occupation: Engineer, maintenance (IT)    Employer: [employer]  Tobacco Use   Smoking status: Never    Smokeless tobacco: Never  Substance and Sexual Activity   Alcohol use: Yes    Alcohol/week: 0.0 standard drinks of alcohol    Comment: socially   Drug use: No   Sexual activity: Yes    Partners: Male    Birth control/protection: None  Other Topics Concern   Not on file  Social History Narrative   Aeronautical engineer 3 days a week--- [employer]   Social Determinants of Health   Financial Resource Strain: Not on file  Food Insecurity: Not on file  Transportation Needs: Not on file  Physical Activity: Not on file  Stress: Not on file  Social Connections: Not on file  Intimate Partner Violence: Not on file    Outpatient Medications Prior to Visit  Medication Sig Dispense Refill   aspirin EC 81 MG tablet Take 1 tablet (81 mg total) by mouth daily.     B Complex-C-Folic Acid (B-COMPLEX BALANCED PO) Take by mouth.     MAGNESIUM PO Take 1 tablet by mouth daily.     Omega-3 Fatty Acids (FISH OIL) 1000 MG CAPS Take by mouth.     Probiotic Product (PROBIOTIC-10 PO) Take by mouth.     Vitamin D, Ergocalciferol, (DRISDOL) 1.25 MG (50000 UNIT) CAPS capsule Take 1 capsule (50,000 Units total) by mouth every 7 (seven) days. 4 capsule 0   VITAMIN E PO Take 1 tablet by mouth daily.     zinc gluconate 50 MG tablet Take 50 mg by mouth daily.     metFORMIN (GLUCOPHAGE XR) 500 MG 24 hr tablet Take 1 tablet (500 mg total) by mouth daily with breakfast. 90 tablet 0   Semaglutide,0.25 or 0.'5MG'$ /DOS, (OZEMPIC, 0.25 OR 0.5 MG/DOSE,) 2 MG/1.5ML SOPN Inject 0.5 mg into the skin once a week. 1.5 mL 2   No facility-administered medications prior to visit.    Allergies  Allergen Reactions   Eggs Or Egg-Derived Products    Metformin And Related     Possible lactic acidosis   Sulfonamide Derivatives Hives    Also high fever   Maitake    Other     Mushrooms and [place] Nuts   Penicillins     Childhood reaction   Reishi Mushroom (Ganoderma Lucidum)    Shellfish Allergy Nausea And Vomiting    Fish-Derived Products Rash    Review of Systems  Constitutional:  Negative for fever and malaise/fatigue.  HENT:  Negative for congestion, sinus pain and sore throat.   Eyes:  Negative for blurred vision.  Respiratory:  Negative for cough, shortness of breath and wheezing.   Cardiovascular:  Negative for chest pain, palpitations and leg swelling.  Gastrointestinal:  Negative for abdominal pain, blood in stool, constipation, diarrhea,  nausea and vomiting.  Genitourinary:  Negative for dysuria, frequency and hematuria.  Musculoskeletal:  Negative for falls.       (-)new muscle pain (-)new joint pain  Skin:  Negative for rash.       (-)New moles  Neurological:  Negative for dizziness, loss of consciousness and headaches.  Endo/Heme/Allergies:  Negative for environmental allergies.  Psychiatric/Behavioral:  Positive for depression. The patient is not nervous/anxious.        Objective:    Physical Exam Constitutional:      General: She is not in acute distress.    Appearance: Normal appearance. She is not ill-appearing.  HENT:     Head: Normocephalic and atraumatic.     Right Ear: Tympanic membrane, ear canal and external ear normal.     Left Ear: Tympanic membrane, ear canal and external ear normal.  Eyes:     Extraocular Movements: Extraocular movements intact.     Pupils: Pupils are equal, round, and reactive to light.  Cardiovascular:     Rate and Rhythm: Normal rate and regular rhythm.     Heart sounds: Normal heart sounds. No murmur heard.    No gallop.  Pulmonary:     Effort: Pulmonary effort is normal. No respiratory distress.     Breath sounds: Normal breath sounds. No wheezing or rales.  Abdominal:     General: Bowel sounds are normal. There is no distension.     Palpations: Abdomen is soft.     Tenderness: There is no abdominal tenderness. There is no guarding.  Skin:    General: Skin is warm and dry.  Neurological:     Mental Status: She is alert and oriented  to person, place, and time.  Psychiatric:        Judgment: Judgment normal.     BP 128/88 (BP Location: Right Arm, Patient Position: Sitting, Cuff Size: Large)   Pulse (!) 103   Temp 98.8 F (37.1 C) (Oral)   Resp 18   Ht '5\' 10"'$  (1.778 m)   Wt (!) 318 lb 6.4 oz (144.4 kg)   LMP 10/26/2022   SpO2 96%   BMI 45.69 kg/m  Wt Readings from Last 3 Encounters:  11/01/22 (!) 318 lb 6.4 oz (144.4 kg)  11/01/20 (!) 318 lb (144.2 kg)  08/18/20 (!) 308 lb (139.7 kg)       Assessment & Plan:  Preventative health care Assessment & Plan: Ghm utd Check labs  See AVS   Orders: -     Lipid panel -     CBC with Differential/Platelet -     Comprehensive metabolic panel -     Hemoglobin A1c -     Microalbumin / creatinine urine ratio  Insulin resistance Assessment & Plan: Recheck labs  Pt working on diet   Orders: -     Lipid panel -     CBC with Differential/Platelet -     Comprehensive metabolic panel -     Hemoglobin A1c -     Microalbumin / creatinine urine ratio  Morbid obesity (HCC) -     Lipid panel -     CBC with Differential/Platelet -     Comprehensive metabolic panel -     Hemoglobin A1c -     Microalbumin / creatinine urine ratio  Depression, unspecified depression type Assessment & Plan: Per psych/ and counselor   Orders: -     Ambulatory referral to Psychiatry -     Ambulatory referral to Psychology  Lack of concentration -     Ambulatory referral to Psychology  Sjogren's syndrome, with unspecified organ involvement [facility]) Assessment & Plan: Per rhuem   Orders: -     Ambulatory referral to Rheumatology -     Naltrexone HCl (Pain); 1 po qd  Dispense: 30 capsule; Refill: 5  Vitamin D deficiency -     VITAMIN D 25 Hydroxy (Vit-D Deficiency, Fractures)  Vitamin B12 deficiency -     Vitamin B12  Hyperglycemia Assessment & Plan: Recheck labs Pt is working on diet      I, Ann Held, DO, personally preformed the services described in  this documentation.  All medical record entries made by the scribe were at my direction and in my presence.  I have reviewed the chart and discharge instructions (if applicable) and agree that the record reflects my personal performance and is accurate and complete. 11/01/2022   I,Shehryar Baig,acting as a scribe for Ann Held, DO.,have documented all relevant documentation on the behalf of Ann Held, DO,as directed by  Ann Held, DO while in the presence of Ann Held, DO.  Ann Held, DO

## 2022-11-01 NOTE — Patient Instructions (Signed)

## 2022-11-01 NOTE — Assessment & Plan Note (Signed)
Per psych/ and counselor

## 2022-11-02 LAB — MICROALBUMIN / CREATININE URINE RATIO
Creatinine,U: 391.4 mg/dL
Microalb Creat Ratio: 0.3 mg/g (ref 0.0–30.0)
Microalb, Ur: 1.2 mg/dL (ref 0.0–1.9)

## 2022-11-04 NOTE — Assessment & Plan Note (Signed)
Recheck labs Pt is working on diet

## 2022-11-04 NOTE — Assessment & Plan Note (Signed)
Ghm utd Check labs  See AVS  

## 2022-11-04 NOTE — Assessment & Plan Note (Signed)
Per rhuem

## 2022-11-04 NOTE — Assessment & Plan Note (Signed)
Recheck labs  Pt working on diet

## 2022-11-06 ENCOUNTER — Telehealth: Payer: Self-pay | Admitting: Family Medicine

## 2022-11-06 DIAGNOSIS — M3500 Sicca syndrome, unspecified: Secondary | ICD-10-CM

## 2022-11-06 NOTE — Telephone Encounter (Signed)
Patient said that [facility] does not have naltrexone in stock and recommended a speciality pharmacy. Patient is requesting written prescription for the medication so she can pick it up and take to pharmacy she already uses. Please call when ready for pick up.

## 2022-11-07 MED ORDER — NALTREXONE HCL (PAIN) 4.5 MG PO CAPS: ORAL_CAPSULE | ORAL | 5 refills | Status: DC

## 2022-11-07 NOTE — Telephone Encounter (Signed)
Rx printed and placed at the front. Pt made aware.

## 2022-11-09 ENCOUNTER — Telehealth: Payer: Self-pay

## 2022-11-09 NOTE — Telephone Encounter (Signed)
Spoke with patient. Pt states she was able to pick up her medication from a compound pharmacy.

## 2022-11-09 NOTE — Telephone Encounter (Signed)
Patient Name Stacey Huerta Patient DOB 03-15-1976 Call Type Pharmacy Message Only Reason for Call Request to send message to Office Initial Comment Caller states she is calling from a pharmacy. Additional Comment Caller states prescription written today doesn't exist. She states doctor can call her back for further details. Pharmacy Name [facility] Pharmacist Name [place] Number [phone] Disp. Time Disposition Final User 11/08/2022 5:49:08 PM General Information Provided Yes Simone Curia Call Closed By: Simone Curia Transaction Date/Time: 11/08/2022 5:45:57 PM (ET)

## 2022-11-20 ENCOUNTER — Encounter: Payer: Self-pay | Admitting: Professional

## 2022-11-20 ENCOUNTER — Ambulatory Visit (INDEPENDENT_AMBULATORY_CARE_PROVIDER_SITE_OTHER): Payer: No Typology Code available for payment source | Admitting: Professional

## 2022-11-20 DIAGNOSIS — F331 Major depressive disorder, recurrent, moderate: Secondary | ICD-10-CM

## 2022-11-20 NOTE — Progress Notes (Signed)
                Stacey Huerta, LCMHC °

## 2022-11-20 NOTE — Progress Notes (Signed)
[facility] Counselor Initial Adult Exam  Name: Stacey Huerta Date: 11/21/2022 MRN: [id number] DOB: 03/07/1976 PCP: Ann Held, DO  Time spent: 62 minutes 1103am-1205pm  Guardian/Payee:  self    Paperwork requested: No   Reason for Visit /Presenting Problem: This session was held via video teletherapy. The patient consented to video teletherapy and was located in her home during this session. She is aware it is the responsibility of the patient to secure confidentiality on her end of the session. The provider was in a private home office for the duration of this session.    The patient arrived on time for her webex appointment.   The patient reports that she has been struggling, her daughter passed away in Jan 20, 2014, she was my only child. It's been hard but I think I have had some unhappiness before that has gotten exacerbated over time. Patient has a lot going on. 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 Has had seasonal depression since childhood but did not realize what it was until college. Was treated for low vitamin D and it helped and she would take a Christmas trip to somewhere warm but this year it has not helped. She has been crying daily, waking up crying, that's not me. The lack of attention and focus at work and is procrastinating personally and professionally. She has to read something multiple times before really it clicks.  Patient is currently on short-term disability from work. She is in a relationship with Stacey Huerta since 20-Jan-2013.  Patient doesn't have any goals in her life. Her best friend and she have been best friends since college. Her best friend went thorugh death of mother and she walked through it with her. Patient herself as a parasite, a leech, the people she loves she will love whatever they love for htem. Her friend's mother died from breast CA and "I  rode her coattails".   Mental Status Exam: Appearance:   Casual     Behavior:  Appropriate  Motor:  Normal  Speech/Language:   Normal Rate  Affect:  Appropriate  Mood:  normal  Thought process:  goal directed  Thought content:    WNL  Sensory/Perceptual disturbances:    WNL  Orientation:  oriented to person, place, time/date, and situation  Attention:  Good  Concentration:  Good  Memory:  WNL  Fund of knowledge:   Good  Insight:    Good  Judgment:   Good  Impulse Control:  Good   Risk Assessment: Danger to Self:  No Self-injurious Behavior: No Danger to Others: No Duty to Warn:no Physical Aggression / Violence:No  Access to Firearms a concern: No  Gang Involvement:No  Patient / guardian was educated about steps to take if suicide or homicide risk level increases between visits: n/a While future psychiatric events cannot be accurately predicted, the patient does not currently require acute inpatient psychiatric care and does not currently meet [facility] involuntary commitment criteria.  Substance Abuse History: Current substance abuse: No     Past Psychiatric History:   No previous psychological problems have been observed Outpatient Providers:none History of Psych Hospitalization: No  Psychological Testing:  none    Abuse History:  Victim of: deferred Report needed:  Victim of Neglect:No. Perpetrator of none  Witness / Exposure to Domestic Violence: No   Protective Services Involvement: No  Witness to [employer] Violence:  deferred  Family History:  Family History  Problem Relation Age of Onset   Hypertension Paternal Grandfather    Diabetes Paternal Grandfather    Diabetes Father    Hypotension Father    Colon cancer Paternal Grandmother    Diabetes Paternal Grandmother    Prostate cancer Maternal Grandfather    Hyperlipidemia Mother    Obesity Mother    Breast cancer Maternal Aunt    Arthritis Paternal Aunt    Hyperlipidemia Maternal Aunt         x's 2   Esophageal cancer Neg Hx    Stomach cancer Neg Hx     Living situation: the patient lives with an boyfriend [place].  Sexual Orientation: Straight  Relationship Status: divorced and co-habitating Name of spouse / other: Stacey Huerta is boyfriend. Previous marriage to [place] at 19, together for ten years, married for five of those years.   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  If a parent, number of children / ages: 49 95 day old daughter died in 74 after premature delivery at 65 weeks. She was high risk as a result of patient's age (60), black, and overweight cause pre-eclampsia. Patient admits that she has not forgiven herself for her death. Patient reports she did not eat right, she never wanted to eat.  The patient had to have an emergency C-section because the pt's organs were shutting down. Her daughter had cardiac issues where her valve would not shut down and they recommended ibuprofen. The patient gained 80 lbs in three days, she was very sick and did not see Aa'va until she was two days old.   They had tried fertility treatment and then stopped and she became pregnant.  Support Systems: significant other friends parents  Financial Stress:  No   Income/Employment/Disability: Employment and an Optometrist at [employer].  Armed forces logistics/support/administrative officer: No   Educational History: Education: Scientist, product/process development: Was raised [employer] but since journey with Aa'va she feels unliked by God.  Any cultural differences that may affect / interfere with treatment:  not applicable   Recreation/Hobbies: deferred  Stressors: Health problems   Loss of daughter Aa'va in 30 at eleven days   Occupational concerns    Strengths: Supportive Relationships, Family, Friends, Conservator, museum/gallery, and Able to Communicate Effectively  Barriers:  none   Legal History: Pending legal issue /  charges: The patient has no significant history of legal issues. History of legal issue / charges:  none  Medical History/Surgical History: reviewed Past Medical History:  Diagnosis Date   Allergy    Back pain    Dyspnea    Fibroid    Headache(784.0)    Hypertension during pregnancy    Infertility, female    Leg edema    Osteoarthritis    Postpartum depression    Sjogren's syndrome ([place]) 10/28/2015   Vitamin B 12 deficiency    Vitamin D deficiency     Past Surgical History:  Procedure Laterality Date   ABDOMINAL ADHESION SURGERY     CESAREAN SECTION N/A 03/18/2014   Procedure: CESAREAN SECTION;  Surgeon: Delice Lesch, MD;  Location: [facility];  Service: Obstetrics;  Laterality: N/A;  spinal/epidural   CHOLECYSTECTOMY     ENDOMETRIAL ABLATION     LYMPHADENECTOMY     Removed from the neck at age 70   MYOMECTOMY     MYOMECTOMY  05/23/2012   Procedure:  MYOMECTOMY;  Surgeon: Governor Specking, MD;  Location: [facility];  Service: Gynecology;  Laterality: N/A;    Medications: Current Outpatient Medications  Medication Sig Dispense Refill   aspirin EC 81 MG tablet Take 1 tablet (81 mg total) by mouth daily.     B Complex-C-Folic Acid (B-COMPLEX BALANCED PO) Take by mouth.     MAGNESIUM PO Take 1 tablet by mouth daily.     Naltrexone HCl, Pain, 4.5 MG CAPS 1 po qd 30 capsule 5   Omega-3 Fatty Acids (FISH OIL) 1000 MG CAPS Take by mouth.     Probiotic Product (PROBIOTIC-10 PO) Take by mouth.     Vitamin D, Ergocalciferol, (DRISDOL) 1.25 MG (50000 UNIT) CAPS capsule Take 1 capsule (50,000 Units total) by mouth every 7 (seven) days. 4 capsule 0   VITAMIN E PO Take 1 tablet by mouth daily.     zinc gluconate 50 MG tablet Take 50 mg by mouth daily.     No current facility-administered medications for this visit.    Allergies  Allergen Reactions   Eggs Or Egg-Derived Products    Metformin And Related     Possible lactic acidosis   Sulfonamide Derivatives Hives    Also high fever    [place]    Other     Mushrooms and [place] Nuts   Penicillins     Childhood reaction   Reishi Mushroom (Ganoderma Lucidum)    Shellfish Allergy Nausea And Vomiting   Fish-Derived Products Rash    Diagnoses:  Major depressive disorder, recurrent episode, moderate ([facility])  Plan of Care:  -consider what she wants from therapy and come prepared to develop treatment plan. -meet again on Tuesday, November 27, 2022 at 5pm.

## 2022-11-21 ENCOUNTER — Telehealth: Payer: Self-pay

## 2022-11-21 DIAGNOSIS — F331 Major depressive disorder, recurrent, moderate: Secondary | ICD-10-CM | POA: Insufficient documentation

## 2022-11-21 HISTORY — DX: Major depressive disorder, recurrent, moderate: F33.1

## 2022-11-21 NOTE — Telephone Encounter (Signed)
Forms received

## 2022-11-21 NOTE — Telephone Encounter (Signed)
Caller Name [place] Phone Number [phone] Patient Name Stacey Huerta Patient DOB September 12, 1976 Call Type Message Only Information Provided Reason for Call Request for General Office Information Initial Comment Caller states that she is calling from [place] regarding a fax that was sent to the office for a patient that is on short term disability at this time. Additional Comment Caller is calling form [employer] to follow up on paperwork that was faxed to the office regarding this patient. Callers ext. is [phone] Disp. Time Disposition Final User 11/20/2022 5:51:23 PM General Information Provided Yes Virgil Benedict Call Closed By: Virgil Benedict Transaction Date/Time: 11/20/2022 5:47:18 PM (ET)

## 2022-11-27 ENCOUNTER — Ambulatory Visit: Payer: No Typology Code available for payment source | Admitting: Professional

## 2022-11-27 ENCOUNTER — Encounter: Payer: Self-pay | Admitting: Professional

## 2022-11-27 DIAGNOSIS — F331 Major depressive disorder, recurrent, moderate: Secondary | ICD-10-CM

## 2022-11-27 NOTE — Progress Notes (Signed)
° ° ° ° ° ° ° ° ° ° ° ° ° ° °  Krissi Willaims, LCMHC °

## 2022-11-27 NOTE — Progress Notes (Signed)
[facility] Counselor/Therapist Progress Note  Patient ID: Stacey Huerta, MRN: [id number],    Date: 11/27/2022  Time Spent: 62 minutes 402-504pm  Treatment Type: Individual Therapy  Risk Assessment: Danger to Self:  No Self-injurious Behavior: No Danger to Others: No  Subjective: This session was held via video teletherapy. The patient consented to video teletherapy and was located in her home during this session. She is aware it is the responsibility of the patient to secure confidentiality on her end of the session. The provider was in a private home office for the duration of this session.    The patient arrived on time for her webex appointment.   Issues addressed: 1-treatment planning -patient and Clinician began development of pt's treatment plan -pt fully participated in treatment planning and agrees with plan thus far   -plan will be finalized at next session   Treatment Plan Problems Addressed  R/O Attention Deficit Disorder (ADD) - Adult, Grief / Loss Unresolved, Type A Behavior, Unipolar Depression Goals 1. Alleviate depressive symptoms and return to previous level of effective functioning. Objective Describe current and past experiences with depression including their impact on functioning and attempts to resolve it. Target Date: 2023-11-27 Frequency: Weekly  Progress: 0 Modality: individual  Related Interventions Encourage the client to share his/her thoughts and feelings of depression; express empathy and build rapport while identifying primary cognitive, behavioral, interpersonal, or other contributors to depression. Objective Verbalize an accurate understanding of depression. Target Date: 2023-11-27 Frequency: Weekly  Progress: 0 Modality: individual  Related Interventions Consistent with the treatment model, discuss how cognitive, behavioral, interpersonal, and/or other factors (e.g., family history) contribute to depression. 2. Begin a  healthy grieving process around the loss. Objective Begin verbalizing feelings associated with the loss. Target Date: 2023-11-27 Frequency: Weekly  Progress: 0 Modality: individual  Related Interventions Assist the client in identifying and expressing feelings connected with his/her loss. Objective Attend a grief/loss support group. Target Date: 2023-11-27 Frequency: Weekly  Progress: 0 Modality: individual  Related Interventions Ask the client to attend a grief/loss support group and report to the therapist how he/she felt about attending. 3. Formulate and implement a new life attitudinal pattern that allows for a more relaxed pattern of living. Objective Describe the pattern of pressured, driven living. Target Date: 2023-11-27 Frequency: Weekly  Progress: 0 Modality: individual  Related Interventions Assess examples of pressured lifestyle including associated situations, cognition, emotion, actions, and impact on client and others. Objective Verbalize a desire to reprioritize values toward less self-focus, more inner and other orientation. Target Date: 2023-11-27 Frequency: Weekly  Progress: 0 Modality: individual  Related Interventions Explore and clarify the client's value system and assist in developing new priorities on the importance of relationships, recreation, spiritual growth, reflection time, giving to others (or assign "Developing Noncompetitive Values" in the Adult Psychotherapy Homework Planner by Bryn Gulling). 4. Minimize ADD behavioral interference in daily life. Objective Cooperate with and complete psychological testing. Target Date: 2023-11-27 Frequency: Weekly  Progress: 0 Modality: individual  Related Interventions Conduct or arrange for psychological testing to further assess ADD, other possible psychopathology (e.g., anxiety, depression), and relevant rule-outs (e.g., ADHD, conduct/antisocial features); provide feedback of testing results. Objective Describe past  and present experiences with ADD including its effects on functioning. Target Date: 2023-11-27 Frequency: Weekly  Progress: 0 Modality: individual  Related Interventions Establish rapport with the client toward building a therapeutic alliance.  Diagnosis:Major depressive disorder, recurrent episode, moderate ([place])  Plan:  -meet again on Wednesday, December 05, 2022 at 2pm.

## 2022-11-28 ENCOUNTER — Encounter: Payer: Self-pay | Admitting: Family Medicine

## 2022-12-05 ENCOUNTER — Ambulatory Visit: Payer: No Typology Code available for payment source | Admitting: Professional

## 2022-12-05 ENCOUNTER — Encounter: Payer: Self-pay | Admitting: Professional

## 2022-12-05 ENCOUNTER — Encounter: Payer: Self-pay | Admitting: Family Medicine

## 2022-12-05 DIAGNOSIS — F4322 Adjustment disorder with anxiety: Secondary | ICD-10-CM

## 2022-12-05 DIAGNOSIS — F32A Depression, unspecified: Secondary | ICD-10-CM | POA: Diagnosis not present

## 2022-12-05 DIAGNOSIS — F338 Other recurrent depressive disorders: Secondary | ICD-10-CM

## 2022-12-05 HISTORY — DX: Adjustment disorder with anxiety: F43.22

## 2022-12-05 HISTORY — DX: Other recurrent depressive disorders: F33.8

## 2022-12-05 NOTE — Progress Notes (Addendum)
[facility] Counselor/Therapist Progress Note  Patient ID: Stacey Huerta, MRN: [id number],    Date: 12/05/2022  Time Spent: 62 minutes [phone]  Treatment Type: Individual Therapy  Risk Assessment: Danger to Self:  No Self-injurious Behavior: No Danger to Others: No  Subjective: This session was held via video teletherapy. The patient consented to video teletherapy and was located in her home during this session. She is aware it is the responsibility of the patient to secure confidentiality on her end of the session. The provider was in a private home office for the duration of this session.    The patient arrived on time for her webex appointment.   Issues addressed: 1-treatment planning -finalized treatment plan -pt fully participated in and agrees with her treatment plan 2-imposter syndrome -struggles to identify positive characteristics that are not "resume-ish" 3-coping -pt struggles to cope with loss of infant -has become more depressed.  Treatment Plan Problems Addressed  R/O Attention Deficit Disorder (ADD) - Adult, Grief / Loss Unresolved, Type A Behavior, Unipolar Depression Goals 1. Alleviate depressive symptoms and return to previous level of effective functioning. Objective Describe current and past experiences with depression including their impact on functioning and attempts to resolve it. Target Date: 2023-11-27 Frequency: Weekly  Progress: 0 Modality: individual  Related Interventions Encourage the client to share his/her thoughts and feelings of depression; express empathy and build rapport while identifying primary cognitive, behavioral, interpersonal, or other contributors to depression. Objective Verbalize an accurate understanding of depression. Target Date: 2023-11-27 Frequency: Weekly  Progress: 0 Modality: individual  Related Interventions Consistent with the treatment model, discuss how cognitive, behavioral, interpersonal,  and/or other factors (e.g., family history) contribute to depression. 2. Begin a healthy grieving process around the loss. Objective Begin verbalizing feelings associated with the loss. Target Date: 2023-11-27 Frequency: Weekly  Progress: 0 Modality: individual  Related Interventions Assist the client in identifying and expressing feelings connected with his/her loss. Objective Attend a grief/loss support group. Target Date: 2023-11-27 Frequency: Weekly  Progress: 0 Modality: individual  Related Interventions Ask the client to attend a grief/loss support group and report to the therapist how he/she felt about attending. 3. Formulate and implement a new life attitudinal pattern that allows for a more relaxed pattern of living. Objective Describe the pattern of pressured, driven living. Target Date: 2023-11-27 Frequency: Weekly  Progress: 0 Modality: individual  Related Interventions Assess examples of pressured lifestyle including associated situations, cognition, emotion, actions, and impact on client and others. Objective Verbalize a desire to reprioritize values toward less self-focus, more inner and other orientation. Target Date: 2023-11-27 Frequency: Weekly  Progress: 0 Modality: individual  Related Interventions Explore and clarify the client's value system and assist in developing new priorities on the importance of relationships, recreation, spiritual growth, reflection time, giving to others (or assign "Developing Noncompetitive Values" in the Adult Psychotherapy Homework Planner by Bryn Gulling). 4. Minimize ADD behavioral interference in daily life. Objective Cooperate with and complete psychological testing. Target Date: 2023-11-27 Frequency: Weekly  Progress: 0 Modality: individual  Related Interventions Conduct or arrange for psychological testing to further assess ADD, other possible psychopathology (e.g., anxiety, depression), and relevant rule-outs (e.g., ADHD,  conduct/antisocial features); provide feedback of testing results. Objective Describe past and present experiences with ADD including its effects on functioning. Target Date: 2023-11-27 Frequency: Weekly  Progress: 0 Modality: individual  Related Interventions Establish rapport with the client toward building a therapeutic alliance.  Diagnosis: Depression Major depressive disorder, recurrent. moderate Seasonal Affective Disorder Adjustment disorder with  anxiety  Plan:  -identify and engage in one self-care activity daily. -meet again on Friday, December 21, 2022 at 1pm.

## 2022-12-07 NOTE — Telephone Encounter (Signed)
Paper work completed.

## 2022-12-13 NOTE — Addendum Note (Signed)
Addended by: Ardyth Man on: 12/13/2022 04:46 PM   Modules accepted: Level of Service

## 2022-12-21 ENCOUNTER — Encounter: Payer: Self-pay | Admitting: Professional

## 2022-12-21 ENCOUNTER — Ambulatory Visit (INDEPENDENT_AMBULATORY_CARE_PROVIDER_SITE_OTHER): Payer: No Typology Code available for payment source | Admitting: Professional

## 2022-12-21 DIAGNOSIS — F331 Major depressive disorder, recurrent, moderate: Secondary | ICD-10-CM | POA: Diagnosis not present

## 2022-12-21 DIAGNOSIS — F4322 Adjustment disorder with anxiety: Secondary | ICD-10-CM | POA: Diagnosis not present

## 2022-12-21 NOTE — Progress Notes (Signed)
[place] Counselor/Therapist Progress Note  Patient ID: Stacey Huerta, MRN: [id number],    Date: 12/21/2022  Time Spent: 65 minutes 1058-1103am  Treatment Type: Individual Therapy  Risk Assessment: Danger to Self:  No Self-injurious Behavior: No Danger to Others: No  Subjective: This session was held via video teletherapy. The patient consented to video teletherapy and was located in her home during this session. She is aware it is the responsibility of the patient to secure confidentiality on her end of the session. The provider was in a private home office for the duration of this session.    The patient arrived early for her webex appointment.   Issues addressed: 1-homework -identify and engage in one self-care activity daily. -pt did a couple of excursions that she enjoyed -pt took in a few views that she enjoyed -pt read a book a couple of days   -started a new book -pt gets a weekly massage -professionally stretched weekly 2-medication -pt is not taking any medication and really  is unsure if she wants to  -discussed importance of medication in the treatment of depression -suggested pt seriously consider since her depression symptoms have worsened over time 3-ADHD -pt has concern for her lack of attention, her difficulty completing tasks -pt reviewed symptoms of ADHD and admits it has ben her for "as long as she can remember" -pt talked with her mother about symptoms of ADHD and her mother stated she has those symptoms and has always considered herself being lazy -completed ASRS and it was clinically significant in 5/6 in Category A and 6/11 in Category B -pt provided with testing resources to include adhdonline and formal psychological evaluation 4-coping a-has been writing down things she does not enjoy -writing down what she can eliminate -has taken her through emotional ups and downs b-has tried to extend grace to herself -culturally she has  learned she is to do something all the time -discussed how doing nothing is doing something and at times it is okay c-discussed controlling thoughts, feelings, and behaviors -pt noticed that she can more quickly motivate herself -she has permitted herself to avoid her emotions at times 5-grief a-battling survivor guilt -pt still blames herself for her daughter's death -doesn't feel she was aggressive enough b-pt was "very much not myself when I was pregnant" 6-work a-"I don't want to do this anymore" b-pt thinks she would like to do something else but has no idea what she could do and afford to do -discussed opportunity to dream, to invest some energy in considering alternatives  Treatment Plan Problems Addressed  R/O Attention Deficit Disorder (ADD) - Adult, Grief / Loss Unresolved, Type A Behavior, Unipolar Depression Goals 1. Alleviate depressive symptoms and return to previous level of effective functioning. Objective Describe current and past experiences with depression including their impact on functioning and attempts to resolve it. Target Date: 2023-11-27 Frequency: Weekly  Progress: 0 Modality: individual  Related Interventions Encourage the client to share his/her thoughts and feelings of depression; express empathy and build rapport while identifying primary cognitive, behavioral, interpersonal, or other contributors to depression. Objective Verbalize an accurate understanding of depression. Target Date: 2023-11-27 Frequency: Weekly  Progress: 0 Modality: individual  Related Interventions Consistent with the treatment model, discuss how cognitive, behavioral, interpersonal, and/or other factors (e.g., family history) contribute to depression. 2. Begin a healthy grieving process around the loss. Objective Begin verbalizing feelings associated with the loss. Target Date: 2023-11-27 Frequency: Weekly  Progress: 0 Modality: individual  Related Interventions  Assist the  client in identifying and expressing feelings connected with his/her loss. Objective Attend a grief/loss support group. Target Date: 2023-11-27 Frequency: Weekly  Progress: 0 Modality: individual  Related Interventions Ask the client to attend a grief/loss support group and report to the therapist how he/she felt about attending. 3. Formulate and implement a new life attitudinal pattern that allows for a more relaxed pattern of living. Objective Describe the pattern of pressured, driven living. Target Date: 2023-11-27 Frequency: Weekly  Progress: 0 Modality: individual  Related Interventions Assess examples of pressured lifestyle including associated situations, cognition, emotion, actions, and impact on client and others. Objective Verbalize a desire to reprioritize values toward less self-focus, more inner and other orientation. Target Date: 2023-11-27 Frequency: Weekly  Progress: 0 Modality: individual  Related Interventions Explore and clarify the client's value system and assist in developing new priorities on the importance of relationships, recreation, spiritual growth, reflection time, giving to others (or assign "Developing Noncompetitive Values" in the Adult Psychotherapy Homework Planner by Bryn Gulling). 4. Minimize ADD behavioral interference in daily life. Objective Cooperate with and complete psychological testing. Target Date: 2023-11-27 Frequency: Weekly  Progress: 0 Modality: individual  Related Interventions Conduct or arrange for psychological testing to further assess ADD, other possible psychopathology (e.g., anxiety, depression), and relevant rule-outs (e.g., ADHD, conduct/antisocial features); provide feedback of testing results. Objective Describe past and present experiences with ADD including its effects on functioning. Target Date: 2023-11-27 Frequency: Weekly  Progress: 0 Modality: individual  Related Interventions Establish rapport with the client toward  building a therapeutic alliance.  Diagnosis: Depression Major depressive disorder, recurrent. moderate Seasonal Affective Disorder Adjustment disorder with anxiety R/O ADHD  Plan:  -consider making appointment for medication evaluation for treatment of depression -meet again on Wednesday, December 26, 2022 at 11am.

## 2022-12-26 ENCOUNTER — Ambulatory Visit: Payer: No Typology Code available for payment source | Admitting: Professional

## 2022-12-26 ENCOUNTER — Encounter: Payer: Self-pay | Admitting: Professional

## 2022-12-26 DIAGNOSIS — F331 Major depressive disorder, recurrent, moderate: Secondary | ICD-10-CM

## 2022-12-26 DIAGNOSIS — F338 Other recurrent depressive disorders: Secondary | ICD-10-CM | POA: Diagnosis not present

## 2022-12-26 DIAGNOSIS — F4322 Adjustment disorder with anxiety: Secondary | ICD-10-CM

## 2022-12-26 NOTE — Progress Notes (Signed)
[place] Counselor/Therapist Progress Note  Patient ID: Stacey Huerta, MRN: [id number],    Date: 12/26/2022  Time Spent: 56 minutes 1102-1158am  Treatment Type: Individual Therapy  Risk Assessment: Danger to Self:  No Self-injurious Behavior: No Danger to Others: No  Subjective: This session was held via video teletherapy. The patient consented to video teletherapy and was located in her home during this session. She is aware it is the responsibility of the patient to secure confidentiality on her end of the session. The provider was in a private home office for the duration of this session.    The patient arrived early for her webex appointment.   Issues addressed: 1-homework- unsure of medication, feels afraid -consider making appointment for medication evaluation for treatment of depression 2-mood appeared lighter a-only child and how she sees the world has been by herself b-her parents wanted her to be independent c-she has been sharing her feelings and that was never "permissible" with others -realizing that she has support from other and doesn't necessarily have to do it by herself d-pt disclosed that she ran away to [place] but told everyone -when she told her parents she was "not okay" and her father did not ever hear her say those words   -her father was not supportive of it until the 3rd week when he told her he was really glad she did this   -he notices she looked and sounded better e-pt admits the physical components to help her feel better (by the [place], in [place] and [place])   -pt is now back in [place] and she's "feeling that feeling again" -pt has a great support system, she was checked on and has great friends 3-pt looked at grief support groups and has mixed feelings a-unsure if she will do 4-ADHD -ADHDonline -did not like their responses   -increase exercise -wants a referral for testing -symptoms include: pt has concern for her lack of  attention, her difficulty completing tasks, pt reviewed symptoms of ADHD and admits it has ben her for "as long as she can remember", pt talked with her mother about symptoms of ADHD and her mother stated she has those symptoms and has always considered herself being lazy 5-creating opportunities for her future -not happy in her job -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 Alleviate depressive symptoms and return to previous level of effective functioning. Objective Describe current and past experiences with depression including their impact on functioning and attempts to resolve it. Target Date: 2023-11-27 Frequency: Weekly  Progress: 0 Modality: individual  Related Interventions Encourage the client to share his/her thoughts and feelings of depression; express empathy and build rapport while identifying primary cognitive, behavioral, interpersonal, or other contributors to depression. Objective Verbalize an accurate understanding of depression. Target Date: 2023-11-27 Frequency: Weekly  Progress: 0 Modality: individual  Related Interventions Consistent with the treatment model, discuss how cognitive, behavioral, interpersonal, and/or other factors (e.g., family history) contribute to depression. 2. Begin a healthy grieving process around the loss. Objective Begin verbalizing feelings associated with the loss. Target Date: 2023-11-27 Frequency: Weekly  Progress: 0 Modality: individual  Related Interventions Assist the client in identifying and expressing feelings connected with his/her loss. Objective Attend a grief/loss support group. Target Date: 2023-11-27 Frequency: Weekly  Progress: 0 Modality: individual  Related Interventions Ask the client to attend a  grief/loss support group and report to the therapist how he/she  felt about attending. 3. Formulate and implement a new life attitudinal pattern that allows for a more relaxed pattern of living. Objective Describe the pattern of pressured, driven living. Target Date: 2023-11-27 Frequency: Weekly  Progress: 0 Modality: individual  Related Interventions Assess examples of pressured lifestyle including associated situations, cognition, emotion, actions, and impact on client and others. Objective Verbalize a desire to reprioritize values toward less self-focus, more inner and other orientation. Target Date: 2023-11-27 Frequency: Weekly  Progress: 0 Modality: individual  Related Interventions Explore and clarify the client's value system and assist in developing new priorities on the importance of relationships, recreation, spiritual growth, reflection time, giving to others (or assign "Developing Noncompetitive Values" in the Adult Psychotherapy Homework Planner by Bryn Gulling). 4. Minimize ADD behavioral interference in daily life. Objective Cooperate with and complete psychological testing. Target Date: 2023-11-27 Frequency: Weekly  Progress: 0 Modality: individual  Related Interventions Conduct or arrange for psychological testing to further assess ADD, other possible psychopathology (e.g., anxiety, depression), and relevant rule-outs (e.g., ADHD, conduct/antisocial features); provide feedback of testing results. Objective Describe past and present experiences with ADD including its effects on functioning. Target Date: 2023-11-27 Frequency: Weekly  Progress: 0 Modality: individual  Related Interventions Establish rapport with the client toward building a therapeutic alliance.  Diagnosis: Depression Major depressive disorder, recurrent. moderate Seasonal Affective Disorder Adjustment disorder with anxiety R/O ADHD  Plan:  -meet again on Monday, December 31, 2022 at 11am.

## 2022-12-31 ENCOUNTER — Encounter: Payer: Self-pay | Admitting: Professional

## 2022-12-31 ENCOUNTER — Ambulatory Visit (INDEPENDENT_AMBULATORY_CARE_PROVIDER_SITE_OTHER): Payer: No Typology Code available for payment source | Admitting: Professional

## 2022-12-31 DIAGNOSIS — F4322 Adjustment disorder with anxiety: Secondary | ICD-10-CM | POA: Diagnosis not present

## 2022-12-31 DIAGNOSIS — F331 Major depressive disorder, recurrent, moderate: Secondary | ICD-10-CM | POA: Diagnosis not present

## 2022-12-31 DIAGNOSIS — F338 Other recurrent depressive disorders: Secondary | ICD-10-CM

## 2022-12-31 NOTE — Progress Notes (Signed)
[place] Counselor/Therapist Progress Note  Patient ID: Stacey Huerta, MRN: [id number],    Date: 12/31/2022  Time Spent: 58 minutes 1102am-12pm  Treatment Type: Individual Therapy  Risk Assessment: Danger to Self:  No Self-injurious Behavior: No Danger to Others: No  Subjective: This session was held via video teletherapy. The patient consented to video teletherapy and was located in her home during this session. She is aware it is the responsibility of the patient to secure confidentiality on her end of the session. The provider was in a private home office for the duration of this session.    The patient arrived on time for her webex appointment.   Issues addressed: 1-mood a-not doing well at continuing the healthy routine established in [place] -feels depressed b-pt feels stuck -"it didn't take that long to feel that way" c-what caused you to get healthy in [place] -the weather -her view d-coping skills -mini vacation to [place] -future focus -finding what causes you to feel fulfilled professionally 2-professional -feels like she needs to leave her job and do something different -finding things to live for outside of work that brings her joy 3-relationship w bf -pt has enough to retire outside the [place] but long-term boyfriend does not -pt values money and comfort and is very careful how she spends -does not think her boyfriend is happy because he doesn't have the discipline to control his spending -"if we could get this money things together we would have the perfect relationship" -she admits it irks her and makes her mad -she does not feel he is a negative impact on her MH 4-friends a-planning a cruise for bff's 50th birthday b-pt turns 31 next year any they are beginning to talk about her birthday trip c-pt can be spontaneous but is very careful to not do things she cannot do well -karaoke 5-self-care -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 Alleviate depressive symptoms and return to previous level of effective functioning. Objective Describe current and past experiences with depression including their impact on functioning and attempts to resolve it. Target Date: 2023-11-27 Frequency: Weekly  Progress: 0 Modality: individual  Related Interventions Encourage the client to share his/her thoughts and feelings of depression; express empathy and build rapport while identifying primary cognitive, behavioral, interpersonal, or other contributors to depression. Objective Verbalize an accurate understanding of depression. Target Date: 2023-11-27 Frequency: Weekly  Progress: 0 Modality: individual  Related Interventions Consistent with the treatment model, discuss how cognitive, behavioral, interpersonal, and/or other factors (e.g., family history) contribute to depression. 2. Begin a healthy grieving process around the loss. Objective Begin verbalizing feelings associated with the loss. Target Date: 2023-11-27 Frequency: Weekly  Progress: 0 Modality: individual  Related Interventions Assist the client in identifying and expressing feelings connected with his/her loss. Objective Attend a grief/loss support group. Target Date: 2023-11-27 Frequency: Weekly  Progress: 0 Modality: individual  Related Interventions Ask the client to attend a grief/loss support group and report to the therapist how he/she felt about attending. 3. Formulate and implement a new life attitudinal pattern that allows for a more relaxed pattern of living. Objective Describe the pattern of pressured, driven living. Target Date: 2023-11-27 Frequency: Weekly  Progress: 0 Modality: individual  Related Interventions Assess examples of  pressured lifestyle including associated situations, cognition, emotion, actions, and  impact on client and others. Objective Verbalize a desire to reprioritize values toward less self-focus, more inner and other orientation. Target Date: 2023-11-27 Frequency: Weekly  Progress: 0 Modality: individual  Related Interventions Explore and clarify the client's value system and assist in developing new priorities on the importance of relationships, recreation, spiritual growth, reflection time, giving to others (or assign "Developing Noncompetitive Values" in the Adult Psychotherapy Homework Planner by Bryn Gulling). 4. Minimize ADD behavioral interference in daily life. Objective Cooperate with and complete psychological testing. Target Date: 2023-11-27 Frequency: Weekly  Progress: 0 Modality: individual  Related Interventions Conduct or arrange for psychological testing to further assess ADD, other possible psychopathology (e.g., anxiety, depression), and relevant rule-outs (e.g., ADHD, conduct/antisocial features); provide feedback of testing results. Objective Describe past and present experiences with ADD including its effects on functioning. Target Date: 2023-11-27 Frequency: Weekly  Progress: 0 Modality: individual  Related Interventions Establish rapport with the client toward building a therapeutic alliance.  Diagnosis: Depression Major depressive disorder, recurrent. moderate Seasonal Affective Disorder Adjustment disorder with anxiety R/O ADHD  Plan:  -engage in self-care -call benefits rep and ask how to extend time off -review objectives at next session -meet again on Monday, January 07, 2023 at 8am.

## 2023-01-07 ENCOUNTER — Encounter: Payer: Self-pay | Admitting: Professional

## 2023-01-07 ENCOUNTER — Ambulatory Visit (INDEPENDENT_AMBULATORY_CARE_PROVIDER_SITE_OTHER): Payer: No Typology Code available for payment source | Admitting: Professional

## 2023-01-07 DIAGNOSIS — F338 Other recurrent depressive disorders: Secondary | ICD-10-CM

## 2023-01-07 DIAGNOSIS — F4322 Adjustment disorder with anxiety: Secondary | ICD-10-CM | POA: Diagnosis not present

## 2023-01-07 DIAGNOSIS — F331 Major depressive disorder, recurrent, moderate: Secondary | ICD-10-CM | POA: Diagnosis not present

## 2023-01-07 NOTE — Progress Notes (Signed)
[facility] Counselor/Therapist Progress Note  Patient ID: Stacey Huerta, MRN: [id number],    Date: February 01, 2023  Time Spent: 47 minutes 101-148pm  Treatment Type: Individual Therapy  Risk Assessment: Danger to Self:  No Self-injurious Behavior: No Danger to Others: No  Subjective: This session was held via video teletherapy. The patient consented to video teletherapy and was located in her home during this session. She is aware it is the responsibility of the patient to secure confidentiality on her end of the session. The provider was in a private home office for the duration of this session.    The patient arrived on time for her webex appointment.   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"; will wash and dry and do nothing else -gets preoccupied with one task and cannot do anything else -she admits "there are spots of clutter", lacks organization 2-how does patient look when she is not depressed -she would be out and active, instead she is more isolated, unmotivated to interact socially 3-mood a-struggling to be motivated -depression in past has been more seasonally affected -depression worsens around her birthday, when she passed, mother's day -sees current depressed mood is the culmination of many things including death of child, spouse's hope that they could still have a child despite her being peri-menopausal -pt reports she has told him that -pt fears the thought of getting pregnant since she almost died      02/01/23    1:02 PM 11/01/2022    1:45 PM 11/01/2020   10:13 AM  Depression screen PHQ 2/9  Decreased Interest 2 3 1   Down, Depressed, Hopeless 1 3 1   PHQ - 2 Score 3 6 2   Altered sleeping 3 3   Tired, decreased energy 3 1   Change in appetite 1 3   Feeling bad or failure about yourself  0    Trouble concentrating 1 0   Moving slowly or fidgety/restless 1 0   Suicidal thoughts 0    PHQ-9 Score 12 13   Difficult doing work/chores  Extremely dIfficult   02-01-2023 Somewhat difficult to complete work, things at home, or get along with other people   Mood continued: -peel feels depressed, unproductive, worrying about not getting things completed that she needs to do -her primary anxiety is about being unproductive, why can't I get it together to get basic stuff done". -pt admits "I am trying to reconcile the guilt part" and that she is constantly replaying what happened and what she "should" have done. -patient admits that it is not normally like her to just follow what someone says but during her pregnancy she did not advocate for herself or her child.  -even after her child was born and they "pushed a treatment on [place]" with the use of Tylenol despite the risks. She trusted the medical professionals and admits that she did not advocate. 5-medication -has given thought to it but because she feels like she want to wait until the "seasonal stuff" clears up. 6-reviewed measurable objectives -pt has made some progress -daughter's death is  spoken about frequently and she admits to still actively grieving  Treatment Plan Problems Addressed  R/O Attention Deficit Disorder (ADD) - Adult, Grief / Loss Unresolved, Type A Behavior, Unipolar Depression Goals 1. Alleviate depressive symptoms and return to previous level of effective functioning. Objective Describe current and past experiences with depression including their impact on functioning and attempts to resolve it. Target Date: 2023-11-27  Frequency: Weekly  Progress: 50 Modality: individual  Related Interventions Encourage the client to share his/her thoughts and feelings of depression; express empathy and build rapport while identifying primary cognitive, behavioral, interpersonal, or other contributors to depression. Objective Verbalize an accurate understanding of depression. Target Date: 2023-11-27 Frequency: Weekly  Progress: 30 Modality: individual  Related Interventions Consistent with the treatment model, discuss how cognitive, behavioral, interpersonal, and/or other factors (e.g., family history) contribute to depression. 2. Begin a healthy grieving process around the loss. Objective Begin verbalizing feelings associated with the loss. (01/07/2023 Comes and goes in waves even now) Target Date: 2023-11-27 Frequency: Weekly  Progress: 50 Modality: individual  Related Interventions Assist the client in identifying and expressing feelings connected with her loss. Objective Attend a grief/loss support group. Target Date: 2023-11-27 Frequency: Weekly  Progress: 20 Modality: individual  Related Interventions Ask the client to attend a grief/loss support group and report to the therapist how he/she felt about attending. 3. Formulate and implement a new life attitudinal pattern that allows for a more relaxed pattern of living. Objective Describe the pattern of pressured, driven living. Target Date: 2023-11-27 Frequency: Weekly  Progress: 20 Modality: individual  Related Interventions Assess examples of pressured lifestyle including associated situations, cognition, emotion, actions, and impact on client and others. Objective Verbalize a desire to reprioritize values toward less self-focus, more inner and other orientation. Target Date: 2023-11-27 Frequency: Weekly  Progress: 0 Modality: individual  Related Interventions Explore and clarify the client's value system and assist in developing new priorities on the  importance of relationships, recreation, spiritual growth, reflection time, giving to others (or assign "Developing Noncompetitive Values" in the Adult Psychotherapy Homework Planner by Bryn Gulling). 4. Minimize ADD behavioral interference in daily life. Objective Cooperate with and complete psychological testing. Target Date: 2023-11-27 Frequency: Weekly  Progress: 50 (appointment scheduled) Modality: individual  Related Interventions Conduct or arrange for psychological testing to further assess ADD, other possible psychopathology (e.g., anxiety, depression), and relevant rule-outs (e.g., ADHD, conduct/antisocial features); provide feedback of testing results. Objective Describe past and present experiences with ADD including its effects on functioning. Target Date: 2023-11-27 Frequency: Weekly  Progress: 30 Modality: individual  Related Interventions Establish rapport with the client toward building a therapeutic alliance.  Diagnosis: Depression Major depressive disorder, recurrent. moderate Seasonal Affective Disorder Adjustment disorder with anxiety R/O ADHD  Plan:  -meet again on Thursday, January 17, 2023 at 12pm.

## 2023-01-17 ENCOUNTER — Ambulatory Visit: Payer: No Typology Code available for payment source | Admitting: Professional

## 2023-01-17 ENCOUNTER — Encounter: Payer: Self-pay | Admitting: Professional

## 2023-01-17 DIAGNOSIS — F4322 Adjustment disorder with anxiety: Secondary | ICD-10-CM | POA: Diagnosis not present

## 2023-01-17 DIAGNOSIS — F331 Major depressive disorder, recurrent, moderate: Secondary | ICD-10-CM | POA: Diagnosis not present

## 2023-01-17 NOTE — Progress Notes (Signed)
[place] [facility] Counselor/Therapist Progress Note  Patient ID: Stacey Huerta, MRN: [id number],    Date: 01/17/2023  Time Spent: 60 minutes 1201-101pm  Treatment Type: Individual Therapy  Risk Assessment: Danger to Self:  No Self-injurious Behavior: No Danger to Others: No  Subjective: This session was held via video teletherapy. The patient consented to video teletherapy and was located in her home during this session. She is aware it is the responsibility of the patient to secure confidentiality on her end of the session. The provider was in a private home office for the duration of this session.    The patient arrived on time for her webex appointment. Patient's disability was approved until April 19th, with return to work date of Monday, April 22nd.  Issues addressed: 1-personal a-trying to discover what is motivating for her b-she is interested in taking a glass blowing class and asked Stacey Huerta if he would do with her -pt has trouble committing to completing projects c-she does a video diary about two days per week d-pt describes herself as super-defiant -fiercely independent since she was a child -pt reports her father would describe her as hard headed 2-professional -pt decided she would apply for a few jobs and has had two interviews -pt does not feel interested in either position -the expectation of working in office and travel are both deal breakers -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 but the negatives are not worth it -pt identified some personnel issues that she was made aware of during interview -she admits that she feels stuck  on one hand because she is going to be doing something she really doesn't care about -she knows that she is no longer stuck with her current employers 3-relationship with Stacey Huerta a-he states pt doesn't respect him b-pt doesn't believe you can love someone without respect c-he is not good with money d-he is not a  "can do" person but procrastinates e-he's never been held at a certain responsibility by any of the women in his life f-likes about Stacey Huerta -he is a fun guy with a great sense of humor -he is very talented -pt comments that he is manipulative -"I can't love him more than I love me" -pt very clear that she will not carry Stacey Huerta financially and that if she chooses to move to [place] he will need to pay his ow way 4-patient visit with parents -father expressed his concern about her safety when she opted to go to [place] -she shared with he parents that they need to get used to the her moving out of the [place] for at least the winter months -pt pleased that her parents now understand what her thoughts are  Treatment Plan Problems Addressed  R/O Attention Deficit Disorder (ADD) - Adult, Grief / Loss Unresolved, Type A Behavior, Unipolar Depression Goals 1. Alleviate depressive symptoms and return to previous level of effective functioning. Objective Describe current and past experiences with depression including their impact on functioning and attempts to resolve it. Target Date: 2023-11-27 Frequency: Weekly  Progress: 50 Modality: individual  Related Interventions Encourage the client to share his/her thoughts and feelings of depression; express empathy and build rapport while identifying primary cognitive, behavioral, interpersonal, or other contributors to depression. Objective Verbalize an accurate understanding of depression. Target Date: 2023-11-27 Frequency: Weekly  Progress: 30 Modality: individual  Related Interventions Consistent with the treatment model, discuss how cognitive, behavioral, interpersonal, and/or other factors (e.g., family history) contribute to depression. 2. Begin a healthy  grieving process around the loss. Objective Begin verbalizing feelings associated with the loss. (01/07/2023 Comes and goes in waves even now) Target Date: 2023-11-27 Frequency: Weekly  Progress: 50  Modality: individual  Related Interventions Assist the client in identifying and expressing feelings connected with her loss. Objective Attend a grief/loss support group. Target Date: 2023-11-27 Frequency: Weekly  Progress: 20 Modality: individual  Related Interventions Ask the client to attend a grief/loss support group and report to the therapist how he/she felt about attending. 3. Formulate and implement a new life attitudinal pattern that allows for a more relaxed pattern of living. Objective Describe the pattern of pressured, driven living. Target Date: 2023-11-27 Frequency: Weekly  Progress: 20 Modality: individual  Related Interventions Assess examples of pressured lifestyle including associated situations, cognition, emotion, actions, and impact on client and others. Objective Verbalize a desire to reprioritize values toward less self-focus, more inner and other orientation. Target Date: 2023-11-27 Frequency: Weekly  Progress: 0 Modality: individual  Related Interventions Explore and clarify the client's value system and assist in developing new priorities on the importance of relationships, recreation, spiritual growth, reflection time, giving to others (or assign "Developing Noncompetitive Values" in the Adult Psychotherapy Homework Planner by Bryn Gulling). 4. Minimize ADD behavioral interference in daily life. Objective Cooperate with and complete psychological testing. Target Date: 2023-11-27 Frequency: Weekly  Progress: 50 (appointment scheduled) Modality: individual  Related Interventions Conduct or arrange for psychological testing to further assess ADD, other possible psychopathology (e.g., anxiety, depression), and relevant rule-outs (e.g., ADHD, conduct/antisocial features); provide feedback of testing results. Objective Describe past and present experiences with ADD including its effects on functioning. Target Date: 2023-11-27 Frequency: Weekly  Progress: 30 Modality:  individual  Related Interventions Establish rapport with the client toward building a therapeutic alliance.  Diagnosis: Depression Major depressive disorder, recurrent. moderate Seasonal Affective Disorder Adjustment disorder with anxiety R/O ADHD  Plan:  -meet again on Wednesday, January 23, 2023 at 1pm.

## 2023-01-23 ENCOUNTER — Ambulatory Visit: Payer: No Typology Code available for payment source | Admitting: Professional

## 2023-01-23 ENCOUNTER — Encounter: Payer: Self-pay | Admitting: Professional

## 2023-01-23 DIAGNOSIS — F4322 Adjustment disorder with anxiety: Secondary | ICD-10-CM

## 2023-01-23 DIAGNOSIS — F331 Major depressive disorder, recurrent, moderate: Secondary | ICD-10-CM | POA: Diagnosis not present

## 2023-01-23 DIAGNOSIS — F338 Other recurrent depressive disorders: Secondary | ICD-10-CM

## 2023-01-23 NOTE — Progress Notes (Signed)
[facility] Counselor/Therapist Progress Note  Patient ID: Stacey Huerta, MRN: [id number],    Date: 01/17/2023  Time Spent: 48 minutes 1-148pm  Treatment Type: Individual Therapy  Risk Assessment: Danger to Self:  No Self-injurious Behavior: No Danger to Others: No  Subjective: This session was held via video teletherapy. The patient consented to video teletherapy and was located in her home during this session. She is aware it is the responsibility of the patient to secure confidentiality on her end of the session. The provider was in a private home office for the duration of this session.    The patient arrived on time for her webex appointment.   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   -pt put her friend through so many changes the last time she will not do again   -pt asked bff to understand that she now understands why she was the way she was 15 years ago c-strategies for managing ADHD personally and professionally -educated -examples -pt doesn't form habits easily -how to create workable solutions -gaining control over laundry, and storage 2-self-care -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 Alleviate depressive symptoms and return to previous level of effective functioning. Objective Describe current and past  experiences with depression including their impact on functioning and attempts to resolve it. Target Date: 2023-11-27 Frequency: Weekly  Progress: 50 Modality: individual  Related Interventions Encourage the client to share his/her thoughts and feelings of depression; express empathy and build rapport while identifying primary cognitive, behavioral, interpersonal, or other contributors to depression. Objective Verbalize an accurate understanding of depression. Target Date: 2023-11-27 Frequency: Weekly  Progress: 30 Modality: individual  Related Interventions Consistent with the treatment model, discuss how cognitive, behavioral, interpersonal, and/or other factors (e.g., family history) contribute to depression. 2. Begin a healthy grieving process around the loss. Objective Begin verbalizing feelings associated with the loss. (01/07/2023 Comes and goes in waves even now) Target Date: 2023-11-27 Frequency: Weekly  Progress: 50 Modality: individual  Related Interventions Assist the client in identifying and expressing feelings connected with her loss. Objective Attend a grief/loss support group. Target Date: 2023-11-27 Frequency: Weekly  Progress: 20 Modality: individual  Related Interventions Ask the client to attend a grief/loss support group and report to the therapist how he/she felt about attending. 3. Formulate and implement a new life attitudinal pattern that allows for a more relaxed pattern of living. Objective Describe the pattern of pressured, driven living. Target Date: 2023-11-27 Frequency: Weekly  Progress: 20 Modality: individual  Related Interventions Assess examples of pressured lifestyle including associated situations, cognition, emotion, actions, and impact on client and others. Objective Verbalize a desire to reprioritize values toward less self-focus, more inner and other orientation. Target Date: 2023-11-27 Frequency: Weekly  Progress: 0 Modality: individual   Related Interventions Explore and clarify the client's value system and assist in developing new priorities on the importance of relationships, recreation,  spiritual growth, reflection time, giving to others (or assign "Developing Noncompetitive Values" in the Adult Psychotherapy Homework Planner by Bryn Gulling). 4. Minimize ADD behavioral interference in daily life. Objective Cooperate with and complete psychological testing. Target Date: 2023-11-27 Frequency: Weekly  Progress: 50 (appointment scheduled) Modality: individual  Related Interventions Conduct or arrange for psychological testing to further assess ADD, other possible psychopathology (e.g., anxiety, depression), and relevant rule-outs (e.g., ADHD, conduct/antisocial features); provide feedback of testing results. Objective Describe past and present experiences with ADD including its effects on functioning. Target Date: 2023-11-27 Frequency: Weekly  Progress: 30 Modality: individual  Related Interventions Establish rapport with the client toward building a therapeutic alliance.  Diagnosis: Depression Major depressive disorder, recurrent. moderate Seasonal Affective Disorder Adjustment disorder with anxiety R/O ADHD  Plan:  -create drawer space by going through drawers -next visit discuss family member Crystal and possible estrangement -meet again on Monday, February 04, 2023 at 1pm.

## 2023-02-04 ENCOUNTER — Encounter: Payer: Self-pay | Admitting: Professional

## 2023-02-04 ENCOUNTER — Ambulatory Visit (INDEPENDENT_AMBULATORY_CARE_PROVIDER_SITE_OTHER): Payer: No Typology Code available for payment source | Admitting: Professional

## 2023-02-04 DIAGNOSIS — F4322 Adjustment disorder with anxiety: Secondary | ICD-10-CM

## 2023-02-04 DIAGNOSIS — F331 Major depressive disorder, recurrent, moderate: Secondary | ICD-10-CM

## 2023-02-04 NOTE — Progress Notes (Signed)
[facility] Counselor/Therapist Progress Note  Patient ID: Stacey Huerta, MRN: [id number],    Date: 02/04/2023  Time Spent: 55 minutes 103-158pm  Treatment Type: Individual Therapy  Risk Assessment: Danger to Self:  No Self-injurious Behavior: No Danger to Others: No  Subjective: This session was held via video teletherapy. The patient consented to video teletherapy and was located in her home during this session. She is aware it is the responsibility of the patient to secure confidentiality on her end of the session. The provider was in a private home office for the duration of this session.    The patient arrived on time for her webex appointment.   Issues addressed: 1-homework a-create drawer space by going through drawers b-was surprised by the amount of clothing she has with tags still on the item -notices part of problem is that she cannot see everything -difficult to see clothes that have a memory attached   -the dress she wore when she met [name] because of [employer] and because of the weight she has gained c-there are no items that would stop her related to the death of her baby girl -she recalls some physical symptoms that she said she did not notice   -she admits that she still thinks about how she contributed to her daughter's loss -the nursing staff kept a diary of her daughter's NICU account and she has never read -pt found the other day when going through documents and opened but immediately closed -asked pt to consider if she would allow a trusted friend to read for her and provide some feedback -pt plans to visit her daughter's J[place] Garter in May on her 7th birthday and decorate   -she plans to read the diary to her daughter   -pt does have flare ups of anger related to the loss of her daughter's death 2-distant cousin Crystal and possible estrangement a-found each other in 36's and pt believed they have bonded over their experiences b-Crystal  having issues with her mother and knows it is a toxic relationship -restraining order against her mother c-pt reached out fo her because pt was struggling -her cousin did not reply -she never heard from her -she was back for a month when Crystal started calling and pt did not take calls d-Paul shared that he was shocked by her willingness to cut her off -he suggested either block her or call her -he is very forgiving, pt is not e-Sunday a week patient answered phone and had little to say -Crystal immediately started talking about her mother Radio broadcast assistant said she wasn't getting good energy -pt told her to thinks about their last contact and why she upset with her -Crystal spend the next hour trying to convince patient she really loved her -pt feels she does all the work -pt has acquired other people that are emotionally available and Crystal is not  Treatment Plan Problems Addressed  R/O Attention Deficit Disorder (ADD) - Adult, Grief / Loss Unresolved, Type A Behavior, Unipolar Depression Goals 1. Alleviate depressive symptoms and return to previous level of effective functioning. Objective Describe current and past experiences with depression including their impact on functioning and attempts to resolve it. Target Date: 2023-11-27 Frequency: Weekly  Progress: 50 Modality: individual  Related Interventions Encourage the client to share his/her thoughts and feelings of depression; express empathy and build rapport while identifying primary cognitive, behavioral, interpersonal, or other contributors to depression. Objective Verbalize an accurate understanding of depression. Target Date: 2023-11-27 Frequency: Weekly  Progress:  30 Modality: individual  Related Interventions Consistent with the treatment model, discuss how cognitive, behavioral, interpersonal, and/or other factors (e.g., family history) contribute to depression. 2. Begin a healthy grieving process around the  loss. Objective Begin verbalizing feelings associated with the loss. (01/07/2023 Comes and goes in waves even now) Target Date: 2023-11-27 Frequency: Weekly  Progress: 50 Modality: individual  Related Interventions Assist the client in identifying and expressing feelings connected with her loss. Objective Attend a grief/loss support group. Target Date: 2023-11-27 Frequency: Weekly  Progress: 20 Modality: individual  Related Interventions Ask the client to attend a grief/loss support group and report to the therapist how he/she felt about attending. 3. Formulate and implement a new life attitudinal pattern that allows for a more relaxed pattern of living. Objective Describe the pattern of pressured, driven living. Target Date: 2023-11-27 Frequency: Weekly  Progress: 20 Modality: individual  Related Interventions Assess examples of pressured lifestyle including associated situations, cognition, emotion, actions, and impact on client and others. Objective Verbalize a desire to reprioritize values toward less self-focus, more inner and other orientation. Target Date: 2023-11-27 Frequency: Weekly  Progress: 0 Modality: individual  Related Interventions Explore and clarify the client's value system and assist in developing new priorities on the importance of relationships, recreation, spiritual growth, reflection time, giving to others (or assign "Developing Noncompetitive Values" in the Adult Psychotherapy Homework Planner by Stephannie Li). 4. Minimize ADD behavioral interference in daily life. Objective Cooperate with and complete psychological testing. Target Date: 2023-11-27 Frequency: Weekly  Progress: 50 (appointment scheduled) Modality: individual  Related Interventions Conduct or arrange for psychological testing to further assess ADD, other possible psychopathology (e.g., anxiety, depression), and relevant rule-outs (e.g., ADHD, conduct/antisocial features); provide feedback of testing  results. Objective Describe past and present experiences with ADD including its effects on functioning. Target Date: 2023-11-27 Frequency: Weekly  Progress: 30 Modality: individual  Related Interventions Establish rapport with the client toward building a therapeutic alliance.  Diagnosis: Depression Major depressive disorder, recurrent. moderate Seasonal Affective Disorder Adjustment disorder with anxiety R/O ADHD  Plan:  -meet again on Monday, February 18, 2023 at 12pm.

## 2023-02-12 ENCOUNTER — Encounter: Payer: Self-pay | Admitting: Internal Medicine

## 2023-02-12 ENCOUNTER — Ambulatory Visit: Payer: No Typology Code available for payment source | Attending: Internal Medicine | Admitting: Internal Medicine

## 2023-02-12 VITALS — BP 119/78 | HR 92 | Resp 14 | Ht 69.5 in | Wt 321.0 lb

## 2023-02-12 DIAGNOSIS — K117 Disturbances of salivary secretion: Secondary | ICD-10-CM

## 2023-02-12 DIAGNOSIS — R899 Unspecified abnormal finding in specimens from other organs, systems and tissues: Secondary | ICD-10-CM

## 2023-02-12 DIAGNOSIS — M3500 Sicca syndrome, unspecified: Secondary | ICD-10-CM

## 2023-02-12 DIAGNOSIS — M170 Bilateral primary osteoarthritis of knee: Secondary | ICD-10-CM

## 2023-02-12 HISTORY — DX: Disturbances of salivary secretion: K11.7

## 2023-02-12 HISTORY — DX: Bilateral primary osteoarthritis of knee: M17.0

## 2023-02-12 NOTE — Progress Notes (Signed)
Office Visit Note  Patient: Stacey Huerta             Date of Birth: [birth year]           MRN: [id number]             PCP: Donato Schultz, DO Referring: Donato Schultz, * Visit Date: 02/12/2023 Occupation: Doctor, hospital  Subjective:  New Patient (Initial Visit) (Patient states pain is in the lower body.)   History of Present Illness: Stacey Huerta is a 47 y.o. female here for evaluation and management of sjogren syndrome. Previously saw Dr. Sharmon Revere in 2016 associated with positive RNP and SSA Abs, raynauds and xerostomia.  Dryness symptoms have been pretty consistently present they recently eyes are doing okay is able to wear contacts.  Dental problems are new needing several teeth pulled which has not been the case for her in the past and without any other specific change to account for this. Joint pains are mostly from the hips down does not have chronic issues with her upper body.  Previously symptoms were doing well when taking fertility treatments including dexamethasone and aspirin.  Was also previously on naltrexone with the benefit for arthritis pain.  She has had intra-articular steroid injections for knee osteoarthritis most recently had viscosupplementation in the left knee last year.  She has never been on any drug specifically for Sjogren syndrome associated arthritis. Gets rashes easily on sun exposed areas. Also notices easy bruising often without recalling any impact to the site.  No history of significant abnormal bleeding or blood clots. Discoloration in fingers with cold exposure but never associated with any lesions or skin peeling.   Activities of Daily Living:  Patient reports morning stiffness for 1-24 hours.   Patient Denies nocturnal pain.  Difficulty dressing/grooming: Denies Difficulty climbing stairs: Reports Difficulty getting out of chair: Reports Difficulty using hands for taps, buttons, cutlery, and/or writing:  Denies  Review of Systems  Constitutional:  Positive for fatigue.  HENT:  Positive for mouth dryness. Negative for mouth sores.   Eyes:  Positive for dryness.  Respiratory:  Positive for shortness of breath.   Cardiovascular:  Negative for chest pain and palpitations.  Gastrointestinal:  Positive for constipation. Negative for blood in stool and diarrhea.  Endocrine: Negative for increased urination.  Genitourinary:  Negative for involuntary urination.  Musculoskeletal:  Positive for joint pain, gait problem, joint pain, myalgias, morning stiffness, muscle tenderness and myalgias. Negative for joint swelling and muscle weakness.  Skin:  Positive for rash and sensitivity to sunlight. Negative for color change and hair loss.  Allergic/Immunologic: Positive for susceptible to infections.  Neurological:  Positive for headaches. Negative for dizziness.  Hematological:  Negative for swollen glands.  Psychiatric/Behavioral:  Positive for depressed mood. Negative for sleep disturbance. The patient is nervous/anxious.     PMFS History:  Patient Active Problem List   Diagnosis Date Noted   Xerostomia 02/12/2023   Bilateral primary osteoarthritis of knee 02/12/2023   Seasonal affective disorder (HCC) 12/05/2022   Adjustment disorder with anxiety 12/05/2022   Major depressive disorder, recurrent episode, moderate (HCC) 11/21/2022   Preventative health care 11/01/2022   Depressive episode 11/01/2022   Lack of concentration 11/01/2022   Vitamin B12 deficiency 11/01/2022   Other fatigue 12/08/2019   Pansinusitis 12/23/2018   Influenza 12/23/2018   Other hyperlipidemia 10/08/2018   Insulin resistance 10/08/2018   Vitamin D deficiency 10/08/2018   Hyperglycemia 12/19/2015   Sjogren's syndrome (  HCC) 10/28/2015   Dysphoric mood 12/14/2014   Preterm infant, birth weight 500-749 grams, with 24 completed weeks of gestation 03/21/2014   Preeclampsia 03/16/2014   Morbid obesity (HCC) 03/16/2014    Thrombocytopenia, unspecified (HCC) 03/16/2014   Urticaria due to food allergy 09/28/2012   CHOLECYSTECTOMY, LAPAROSCOPIC, HX OF 08/06/2008   OBESITY, UNSPECIFIED 04/06/2008    Past Medical History:  Diagnosis Date   Allergy    Back pain    Dyspnea    Fibroid    Headache(784.0)    Hypertension during pregnancy    Infertility, female    Leg edema    Osteoarthritis    Postpartum depression    Sjogren's syndrome (HCC) 10/28/2015   Vitamin B 12 deficiency    Vitamin D deficiency     Family History  Problem Relation Age of Onset   Hypertension Paternal Grandfather    Diabetes Paternal Grandfather    Diabetes Father    Hypotension Father    Colon cancer Paternal Grandmother    Diabetes Paternal Grandmother    Prostate cancer Maternal Grandfather    Hyperlipidemia Mother    Obesity Mother    Breast cancer Maternal Aunt    Arthritis Paternal Aunt    Hyperlipidemia Maternal Aunt        x's 2   Esophageal cancer Neg Hx    Stomach cancer Neg Hx    Past Surgical History:  Procedure Laterality Date   ABDOMINAL ADHESION SURGERY     BUNIONECTOMY Right 10/2020   CESAREAN SECTION N/A 03/18/2014   Procedure: CESAREAN SECTION;  Surgeon: Purcell Nails, MD;  Location: [facility];  Service: Obstetrics;  Laterality: N/A;  spinal/epidural   CHOLECYSTECTOMY     ENDOMETRIAL ABLATION     LYMPHADENECTOMY     Removed from the neck at age 66   MYOMECTOMY     MYOMECTOMY  05/23/2012   Procedure: MYOMECTOMY;  Surgeon: Fermin Schwab, MD;  Location: [facility];  Service: Gynecology;  Laterality: N/A;   Social History   Social History Narrative   Nurse, adult 3 days a week--- [employer]   Immunization History  Administered Date(s) Administered   Hepatitis A 04/30/2007, 10/29/2007   Hepatitis B 05/07/2007, 06/04/2007, 11/05/2007   Td 05/07/2007   Tdap 11/01/2020     Objective: Vital Signs: BP 119/78 (BP Location: Right Arm, Patient Position: Sitting, Cuff Size: Normal)    Pulse 92   Resp 14   Ht 5' 9.5" (1.765 m)   Wt (!) 321 lb (145.6 kg)   LMP 01/18/2023   BMI 46.72 kg/m    Physical Exam Constitutional:      Appearance: She is obese.  HENT:     Right Ear: External ear normal.     Left Ear: External ear normal.     Nose: Nose normal.     Mouth/Throat:     Mouth: Mucous membranes are dry.     Pharynx: Oropharynx is clear.  Eyes:     Conjunctiva/sclera: Conjunctivae normal.     Pupils: Pupils are equal, round, and reactive to light.  Cardiovascular:     Rate and Rhythm: Normal rate and regular rhythm.  Pulmonary:     Effort: Pulmonary effort is normal.     Breath sounds: Normal breath sounds.  Musculoskeletal:     Right lower leg: No edema.     Left lower leg: No edema.  Lymphadenopathy:     Cervical: No cervical adenopathy.  Skin:    Findings: No rash.  Neurological:     Mental Status: She is alert.  Psychiatric:        Mood and Affect: Mood normal.      Musculoskeletal Exam:  Shoulders full ROM no tenderness or swelling Elbows full ROM no tenderness or swelling Wrists full ROM no tenderness or swelling Fingers full ROM no tenderness or swelling, left 3rd PIP slightly restricted Knees full ROM no tenderness or, patellofemoral crepitus present Ankles full ROM no tenderness or swelling Right 1st MTP postsurgical changes, severely limited ROM, left 1st MTP bunion   Investigation: No additional findings.  Imaging: No results found.  Recent Labs: Lab Results  Component Value Date   WBC 4.7 11/01/2022   HGB 13.5 11/01/2022   PLT 143.0 (L) 11/01/2022   NA 139 11/01/2022   K 4.7 11/01/2022   CL 106 11/01/2022   CO2 28 11/01/2022   GLUCOSE 87 11/01/2022   BUN 13 11/01/2022   CREATININE 1.28 (H) 11/01/2022   BILITOT 0.9 11/01/2022   ALKPHOS 97 11/01/2022   AST 18 11/01/2022   ALT 16 11/01/2022   PROT 7.4 11/01/2022   ALBUMIN 3.8 11/01/2022   CALCIUM 9.0 11/01/2022   GFRAA 75 05/12/2020    Speciality Comments: No  specialty comments available.  Procedures:  No procedures performed Allergies: Egg-derived products, Metformin and related, Sulfonamide derivatives, [place], Other, Penicillins, Reishi mushroom (ganoderma lucidum), Shellfish allergy, and Fish-derived products   Assessment / Plan:     Visit Diagnoses: Sjogren's syndrome, with unspecified organ involvement (HCC) - Plan: RNP Antibody, Anti-Smith antibody, Sjogrens syndrome-A extractable nuclear antibody, Anti-DNA antibody, double-stranded, C3 and C4, Rheumatoid factor, Sedimentation rate, C-reactive protein, IgG, IgA, IgM  History and findings appear consistent with Sjogren syndrome we will workup for assessing disease activity.  Today to recommend treatment plan.  Antibody markers also checking acute phase reactants and immunoglobulins for evidence of current immune activation.  Would have low threshold to try addition of hydroxychloroquine for multiple active symptoms.  Xerostomia  Dry mouth getting some dental complications from this discussed supportive treatments including hydration, sugar-free gum or lozenges, or Biotene for stimulating saliva production.  If not getting any better could try addition of Salagen.  Bilateral primary osteoarthritis of knee  Knee problems look more consistent with primary osteoarthritis related no definite inflammation on exam today.  Would have to see whether medication change results and any noticeable difference for joint pain.  Orders: Orders Placed This Encounter  Procedures   RNP Antibody   Anti-Smith antibody   Sjogrens syndrome-A extractable nuclear antibody   Anti-DNA antibody, double-stranded   C3 and C4   Rheumatoid factor   Sedimentation rate   C-reactive protein   IgG, IgA, IgM   No orders of the defined types were placed in this encounter.    Follow-Up Instructions: Return in about 2 months (around 04/14/2023) for New pt pSS ?HCQ start f/u 2mos.   Fuller Plan, MD  Note -  This record has been created using [employer].  Chart creation errors have been sought, but may not always  have been located. Such creation errors do not reflect on  the standard of medical care.

## 2023-02-13 LAB — IGG, IGA, IGM: IgG (Immunoglobin G), Serum: 2006 mg/dL — ABNORMAL HIGH (ref 600–1640)

## 2023-02-15 LAB — SJOGRENS SYNDROME-A EXTRACTABLE NUCLEAR ANTIBODY: SSA (Ro) (ENA) Antibody, IgG: >8 AI — AB

## 2023-02-15 LAB — C-REACTIVE PROTEIN: CRP: <3 mg/L (ref <8.0)

## 2023-02-15 LAB — C3 AND C4
C3 Complement: 110 mg/dL (ref 83–193)
C4 Complement: 22 mg/dL (ref 15–57)

## 2023-02-15 LAB — ANTI-SMITH ANTIBODY: ENA SM Ab Ser-aCnc: <1 AI

## 2023-02-15 LAB — ANTI-DNA ANTIBODY, DOUBLE-STRANDED: ds DNA Ab: 12 IU/mL — ABNORMAL HIGH

## 2023-02-15 LAB — RNP ANTIBODY: Ribonucleic Protein(ENA) Antibody, IgG: 7.3 AI — AB

## 2023-02-15 LAB — IGG, IGA, IGM
IgM, Serum: 87 mg/dL (ref 50–300)
Immunoglobulin A: 369 mg/dL — ABNORMAL HIGH (ref 47–310)

## 2023-02-15 LAB — SEDIMENTATION RATE: Sed Rate: 22 mm/h — ABNORMAL HIGH (ref 0–20)

## 2023-02-15 LAB — RHEUMATOID FACTOR: Rheumatoid fact SerPl-aCnc: <10 IU/mL (ref <14)

## 2023-02-18 ENCOUNTER — Encounter: Payer: Self-pay | Admitting: Professional

## 2023-02-18 ENCOUNTER — Ambulatory Visit (INDEPENDENT_AMBULATORY_CARE_PROVIDER_SITE_OTHER): Payer: No Typology Code available for payment source | Admitting: Professional

## 2023-02-18 DIAGNOSIS — F331 Major depressive disorder, recurrent, moderate: Secondary | ICD-10-CM | POA: Diagnosis not present

## 2023-02-18 DIAGNOSIS — F338 Other recurrent depressive disorders: Secondary | ICD-10-CM

## 2023-02-18 DIAGNOSIS — F4322 Adjustment disorder with anxiety: Secondary | ICD-10-CM

## 2023-02-18 NOTE — Progress Notes (Signed)
[facility] Counselor/Therapist Progress Note  Patient ID: Stacey Huerta, MRN: [id number],    Date: 02/18/2023  Time Spent: 52 minutes 1203-1255pm  Treatment Type: Individual Therapy  Risk Assessment: Danger to Self:  No Self-injurious Behavior: No Danger to Others: No  Subjective: This session was held via video teletherapy. The patient consented to video teletherapy and was located in her home during this session. She is aware it is the responsibility of the patient to secure confidentiality on her end of the session. The provider was in a private home office for the duration of this session.    The patient arrived on time for her webex appointment.   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 -it is a whole lot of money -it would involve travel -she would have a staff of four -there has been an offer despite them knowing she doesn't want to come in to office -pt considering what she wants and doesn't want -she doesn't want to go backwards 3-personal a-"closet situation" -it's been difficult -she has so much she has forgotten -she is trying to do five pieces per day b-cousin Crystal -thinks she can go with a texting relationship -sees her as an acquaintance and would not be friends with her if not related c-relationships -likes to keep her closet friends close -is okay with dismissing people from her life -doesn't enjoy groups functions outside of her four girlfriends   Treatment Plan Problems Addressed   R/O Attention Deficit Disorder (ADD) - Adult, Grief / Loss Unresolved, Type A Behavior, Unipolar Depression Goals 1. Alleviate depressive symptoms and return to previous level of effective functioning. Objective Describe current and past experiences with depression including their impact on functioning and attempts to resolve it. Target Date: 2023-11-27 Frequency: Weekly  Progress: 50 Modality: individual  Related Interventions Encourage the client to share his/her thoughts and feelings of depression; express empathy and build rapport while identifying primary cognitive, behavioral, interpersonal, or other contributors to depression. Objective Verbalize an accurate understanding of depression. Target Date: 2023-11-27 Frequency: Weekly  Progress: 30 Modality: individual  Related Interventions Consistent with the treatment model, discuss how cognitive, behavioral, interpersonal, and/or other factors (e.g., family history) contribute to depression. 2. Begin a healthy grieving process around the loss. Objective Begin verbalizing feelings associated with the loss. (01/07/2023 Comes and goes in waves even now) Target Date: 2023-11-27 Frequency: Weekly  Progress: 50 Modality: individual  Related Interventions Assist the client in identifying and expressing feelings connected with her loss. Objective Attend a grief/loss support group. Target Date: 2023-11-27 Frequency: Weekly  Progress: 20 Modality: individual  Related Interventions Ask the client to attend a grief/loss support group and report to the therapist how he/she felt about attending. 3. Formulate and implement a new life attitudinal pattern that allows for a more relaxed pattern of living. Objective Describe the pattern of pressured, driven living. Target Date: 2023-11-27 Frequency: Weekly  Progress: 20 Modality: individual  Related Interventions Assess examples of pressured lifestyle including associated situations, cognition,  emotion, actions, and impact on client  and others. Objective Verbalize a desire to reprioritize values toward less self-focus, more inner and other orientation. Target Date: 2023-11-27 Frequency: Weekly  Progress: 0 Modality: individual  Related Interventions Explore and clarify the client's value system and assist in developing new priorities on the importance of relationships, recreation, spiritual growth, reflection time, giving to others (or assign "Developing Noncompetitive Values" in the Adult Psychotherapy Homework Planner by Stephannie Li). 4. Minimize ADD behavioral interference in daily life. Objective Cooperate with and complete psychological testing. Target Date: 2023-11-27 Frequency: Weekly  Progress: 50 (appointment scheduled) Modality: individual  Related Interventions Conduct or arrange for psychological testing to further assess ADD, other possible psychopathology (e.g., anxiety, depression), and relevant rule-outs (e.g., ADHD, conduct/antisocial features); provide feedback of testing results. Objective Describe past and present experiences with ADD including its effects on functioning. Target Date: 2023-11-27 Frequency: Weekly  Progress: 30 Modality: individual  Related Interventions Establish rapport with the client toward building a therapeutic alliance.  Diagnosis: Depression Major depressive disorder, recurrent. moderate Seasonal Affective Disorder Adjustment disorder with anxiety R/O ADHD  Plan:  -continue job search and understanding professional future -how to make life more manageable with the "things" that take up time and energy   -cleaning out closets and doing own laundry or being able to do own laundry because she has pared down the quantity she possesses. -meet again on Wednesday, Mar 06, 2023 at 10am.

## 2023-02-21 ENCOUNTER — Encounter: Payer: Self-pay | Admitting: Internal Medicine

## 2023-03-06 ENCOUNTER — Ambulatory Visit: Payer: No Typology Code available for payment source | Admitting: Professional

## 2023-03-06 ENCOUNTER — Encounter: Payer: Self-pay | Admitting: Professional

## 2023-03-06 DIAGNOSIS — F4322 Adjustment disorder with anxiety: Secondary | ICD-10-CM

## 2023-03-06 DIAGNOSIS — F338 Other recurrent depressive disorders: Secondary | ICD-10-CM

## 2023-03-06 DIAGNOSIS — F331 Major depressive disorder, recurrent, moderate: Secondary | ICD-10-CM | POA: Diagnosis not present
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
## 2023-03-13 MED ORDER — HYDROXYCHLOROQUINE SULFATE 200 MG PO TABS
400.0000 mg | ORAL_TABLET | Freq: Every day | ORAL | 1 refills | Status: DC
Start: 2023-03-13 — End: 2023-04-15

## 2023-03-13 NOTE — Addendum Note (Signed)
Addended by: Fuller Plan on: 03/13/2023 02:40 AM   Modules accepted: Orders

## 2023-03-20 ENCOUNTER — Ambulatory Visit: Payer: No Typology Code available for payment source | Admitting: Professional

## 2023-03-20 ENCOUNTER — Encounter: Payer: Self-pay | Admitting: Professional

## 2023-03-20 DIAGNOSIS — F331 Major depressive disorder, recurrent, moderate: Secondary | ICD-10-CM | POA: Diagnosis not present

## 2023-03-20 DIAGNOSIS — F338 Other recurrent depressive disorders: Secondary | ICD-10-CM | POA: Diagnosis not present

## 2023-03-20 DIAGNOSIS — F4322 Adjustment disorder with anxiety: Secondary | ICD-10-CM | POA: Diagnosis not present
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
## 2023-04-02 ENCOUNTER — Ambulatory Visit (INDEPENDENT_AMBULATORY_CARE_PROVIDER_SITE_OTHER): Payer: No Typology Code available for payment source | Admitting: Professional

## 2023-04-02 ENCOUNTER — Encounter: Payer: Self-pay | Admitting: Professional

## 2023-04-02 DIAGNOSIS — F4322 Adjustment disorder with anxiety: Secondary | ICD-10-CM | POA: Diagnosis not present

## 2023-04-02 DIAGNOSIS — F331 Major depressive disorder, recurrent, moderate: Secondary | ICD-10-CM | POA: Diagnosis not present

## 2023-04-02 DIAGNOSIS — F338 Other recurrent depressive disorders: Secondary | ICD-10-CM
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
## 2023-04-14 NOTE — Progress Notes (Unsigned)
Office Visit Note  Patient: Stacey Huerta             Date of Birth: [birth year]           MRN: [id number]             PCP: Donato Schultz, DO Referring: Donato Schultz, * Visit Date: 04/15/2023   Subjective:  No chief complaint on file.   History of Present Illness: [name] is a 47 y.o. female here for follow up ***   Previous HPI 02/12/23 Stacey Huerta is a 47 y.o. female here for evaluation and management of sjogren syndrome. Previously saw Dr. Sharmon Revere in 2016 associated with positive RNP and SSA Abs, raynauds and xerostomia.  Dryness symptoms have been pretty consistently present they recently eyes are doing okay is able to wear contacts.  Dental problems are new needing several teeth pulled which has not been the case for her in the past and without any other specific change to account for this. Joint pains are mostly from the hips down does not have chronic issues with her upper body.  Previously symptoms were doing well when taking fertility treatments including dexamethasone and aspirin.  Was also previously on naltrexone with the benefit for arthritis pain.  She has had intra-articular steroid injections for knee osteoarthritis most recently had viscosupplementation in the left knee last year.  She has never been on any drug specifically for Sjogren syndrome associated arthritis. Gets rashes easily on sun exposed areas. Also notices easy bruising often without recalling any impact to the site.  No history of significant abnormal bleeding or blood clots. Discoloration in fingers with cold exposure but never associated with any lesions or skin peeling.   No Rheumatology ROS completed.   PMFS History:  Patient Active Problem List   Diagnosis Date Noted   Xerostomia 02/12/2023   Bilateral primary osteoarthritis of knee 02/12/2023   Seasonal affective disorder (HCC) 12/05/2022   Adjustment disorder with anxiety 12/05/2022   Major  depressive disorder, recurrent episode, moderate (HCC) 11/21/2022   Preventative health care 11/01/2022   Depressive episode 11/01/2022   Lack of concentration 11/01/2022   Vitamin B12 deficiency 11/01/2022   Other fatigue 12/08/2019   Pansinusitis 12/23/2018   Influenza 12/23/2018   Other hyperlipidemia 10/08/2018   Insulin resistance 10/08/2018   Vitamin D deficiency 10/08/2018   Hyperglycemia 12/19/2015   Sjogren's syndrome (HCC) 10/28/2015   Dysphoric mood 12/14/2014   Preterm infant, birth weight 500-749 grams, with 24 completed weeks of gestation 03/21/2014   Preeclampsia 03/16/2014   Morbid obesity (HCC) 03/16/2014   Thrombocytopenia, unspecified (HCC) 03/16/2014   Urticaria due to food allergy 09/28/2012   CHOLECYSTECTOMY, LAPAROSCOPIC, HX OF 08/06/2008   OBESITY, UNSPECIFIED 04/06/2008    Past Medical History:  Diagnosis Date   Allergy    Back pain    Dyspnea    Fibroid    Headache(784.0)    Hypertension during pregnancy    Infertility, female    Leg edema    Osteoarthritis    Postpartum depression    Sjogren's syndrome (HCC) 10/28/2015   Vitamin B 12 deficiency    Vitamin D deficiency     Family History  Problem Relation Age of Onset   Hypertension Paternal Grandfather    Diabetes Paternal Grandfather    Diabetes Father    Hypotension Father    Colon cancer Paternal Grandmother    Diabetes Paternal Grandmother    Prostate cancer Maternal Grandfather  Hyperlipidemia Mother    Obesity Mother    Breast cancer Maternal Aunt    Arthritis Paternal Aunt    Hyperlipidemia Maternal Aunt        x's 2   Esophageal cancer Neg Hx    Stomach cancer Neg Hx    Past Surgical History:  Procedure Laterality Date   ABDOMINAL ADHESION SURGERY     BUNIONECTOMY Right 10/2020   CESAREAN SECTION N/A 03/18/2014   Procedure: CESAREAN SECTION;  Surgeon: Purcell Nails, MD;  Location: [facility];  Service: Obstetrics;  Laterality: N/A;  spinal/epidural   CHOLECYSTECTOMY      ENDOMETRIAL ABLATION     LYMPHADENECTOMY     Removed from the neck at age 9   MYOMECTOMY     MYOMECTOMY  05/23/2012   Procedure: MYOMECTOMY;  Surgeon: Fermin Schwab, MD;  Location: [facility];  Service: Gynecology;  Laterality: N/A;   Social History   Social History Narrative   Nurse, adult 3 days a week--- [employer]   Immunization History  Administered Date(s) Administered   Hepatitis A 04/30/2007, 10/29/2007   Hepatitis B 05/07/2007, 06/04/2007, 11/05/2007   Td 05/07/2007   Tdap 11/01/2020     Objective: Vital Signs: There were no vitals taken for this visit.   Physical Exam   Musculoskeletal Exam: ***  CDAI Exam: CDAI Score: -- Patient Global: --; Provider Global: -- Swollen: --; Tender: -- Joint Exam 04/15/2023   No joint exam has been documented for this visit   There is currently no information documented on the homunculus. Go to the Rheumatology activity and complete the homunculus joint exam.  Investigation: No additional findings.  Imaging: No results found.  Recent Labs: Lab Results  Component Value Date   WBC 4.7 11/01/2022   HGB 13.5 11/01/2022   PLT 143.0 (L) 11/01/2022   NA 139 11/01/2022   K 4.7 11/01/2022   CL 106 11/01/2022   CO2 28 11/01/2022   GLUCOSE 87 11/01/2022   BUN 13 11/01/2022   CREATININE 1.28 (H) 11/01/2022   BILITOT 0.9 11/01/2022   ALKPHOS 97 11/01/2022   AST 18 11/01/2022   ALT 16 11/01/2022   PROT 7.4 11/01/2022   ALBUMIN 3.8 11/01/2022   CALCIUM 9.0 11/01/2022   GFRAA 75 05/12/2020    Speciality Comments: No specialty comments available.  Procedures:  No procedures performed Allergies: Egg-derived products, Metformin and related, Sulfonamide derivatives, Maitake, Other, Penicillins, Reishi mushroom (ganoderma lucidum), Shellfish allergy, and Fish-derived products   Assessment / Plan:     Visit Diagnoses: No diagnosis found.  ***  Orders: No orders of the defined types were placed in this  encounter.  No orders of the defined types were placed in this encounter.    Follow-Up Instructions: No follow-ups on file.   Fuller Plan, MD  Note - This record has been created using [employer].  Chart creation errors have been sought, but may not always  have been located. Such creation errors do not reflect on  the standard of medical care.

## 2023-04-15 ENCOUNTER — Encounter: Payer: Self-pay | Admitting: Internal Medicine

## 2023-04-15 ENCOUNTER — Ambulatory Visit: Payer: No Typology Code available for payment source | Attending: Internal Medicine | Admitting: Internal Medicine

## 2023-04-15 VITALS — BP 119/83 | HR 87 | Resp 14 | Ht 70.0 in | Wt 319.0 lb

## 2023-04-15 DIAGNOSIS — M170 Bilateral primary osteoarthritis of knee: Secondary | ICD-10-CM | POA: Diagnosis not present

## 2023-04-15 DIAGNOSIS — M3500 Sicca syndrome, unspecified: Secondary | ICD-10-CM

## 2023-04-15 DIAGNOSIS — E507 Other ocular manifestations of vitamin A deficiency: Secondary | ICD-10-CM

## 2023-04-15 DIAGNOSIS — K117 Disturbances of salivary secretion: Secondary | ICD-10-CM | POA: Diagnosis not present

## 2023-04-15 HISTORY — DX: Other ocular manifestations of vitamin A deficiency: E50.7

## 2023-04-15 MED ORDER — HYDROXYCHLOROQUINE SULFATE 200 MG PO TABS
200.0000 mg | ORAL_TABLET | Freq: Every day | ORAL | 0 refills | Status: DC
Start: 2023-05-13 — End: 2023-08-07

## 2023-04-16 LAB — CBC WITH DIFFERENTIAL/PLATELET
Absolute Monocytes: 428 cells/uL (ref 200–950)
Basophils Absolute: 21 cells/uL (ref 0–200)
Basophils Relative: 0.5 %
Eosinophils Absolute: 71 cells/uL (ref 15–500)
Eosinophils Relative: 1.7 %
HCT: 38.6 % (ref 35.0–45.0)
Hemoglobin: 12.9 g/dL (ref 11.7–15.5)
Lymphs Abs: 1567 cells/uL (ref 850–3900)
MCH: 31.6 pg (ref 27.0–33.0)
MCHC: 33.4 g/dL (ref 32.0–36.0)
MCV: 94.6 fL (ref 80.0–100.0)
MPV: 13.5 fL — ABNORMAL HIGH (ref 7.5–12.5)
Monocytes Relative: 10.2 %
Neutro Abs: 2113 cells/uL (ref 1500–7800)
Neutrophils Relative %: 50.3 %
Platelets: 142 10*3/uL (ref 140–400)
RBC: 4.08 10*6/uL (ref 3.80–5.10)
RDW: 11.7 % (ref 11.0–15.0)
Total Lymphocyte: 37.3 %
WBC: 4.2 10*3/uL (ref 3.8–10.8)

## 2023-04-16 LAB — SEDIMENTATION RATE: Sed Rate: 22 mm/h — ABNORMAL HIGH (ref 0–20)

## 2023-04-17 ENCOUNTER — Encounter: Payer: Self-pay | Admitting: Professional

## 2023-04-17 ENCOUNTER — Ambulatory Visit: Payer: No Typology Code available for payment source | Admitting: Professional

## 2023-04-17 DIAGNOSIS — F331 Major depressive disorder, recurrent, moderate: Secondary | ICD-10-CM

## 2023-04-17 DIAGNOSIS — F4322 Adjustment disorder with anxiety: Secondary | ICD-10-CM | POA: Diagnosis not present

## 2023-04-17 DIAGNOSIS — F338 Other recurrent depressive disorders: Secondary | ICD-10-CM | POA: Diagnosis not present

## 2023-04-17 NOTE — Progress Notes (Signed)
[facility] Counselor/Therapist Progress Note  Patient ID: Stacey Huerta, MRN: [id number],    Date: 04/17/2023  Time Spent: 57 minutes [phone]  Treatment Type: Individual Therapy  Risk Assessment: Danger to Self:  No Self-injurious Behavior: No Danger to Others: No  Subjective: This session was held via video teletherapy. The patient consented to video teletherapy and was located in her home during this session. She is aware it is the responsibility of the patient to secure confidentiality on her end of the session. The provider was in a private home office for the duration of this session.    The patient arrived early for her Caregility appointment.   Issues addressed: 1-professional a-current employer counter-offered and pt declined -knows that she will not like going to the office -pt plans to work there for one year to pay off house and car and then have no bills -pt would want to negotiate for a hybrid schedule within a year or she will look for another job 2-personal a-went to [place] for her birthday with [name] and that is the first trip they had been on in a while -first time patient drove but she doesn't want to do so again due to maneuvering in the city -she asked her dad why was she "like this" defiant, always ready to fight -father told her she was just like her mother  b-concern about someone telling her she has to take medication -educated pt on testing process and results will yield recommendations -pt very critical of herself and her inability to get things completed and beats herself up -ask [name] for help when needed   Treatment Plan Problems Addressed  R/O Attention Deficit Disorder (ADD) - Adult, Grief / Loss Unresolved, Type A Behavior, Unipolar Depression Goals 1. Alleviate depressive symptoms and return to previous level of effective functioning. Objective Describe current and past experiences with depression including their impact  on functioning and attempts to resolve it. Target Date: 2023-11-27 Frequency: Biweekly  Progress: 50 Modality: individual  Related Interventions Encourage the client to share his/her thoughts and feelings of depression; express empathy and build rapport while identifying primary cognitive, behavioral, interpersonal, or other contributors to depression. Objective Verbalize an accurate understanding of depression. Target Date: 2023-11-27 Frequency: Biweekly  Progress: 30 Modality: individual  Related Interventions Consistent with the treatment model, discuss how cognitive, behavioral, interpersonal, and/or other factors (e.g., family history) contribute to depression. 2. Begin a healthy grieving process around the loss. Objective Begin verbalizing feelings associated with the loss. (01/07/2023 Comes and goes in waves even now) Target Date: 2023-11-27 Frequency: Biweekly  Progress: 50 Modality: individual  Related Interventions Assist the client in identifying and expressing feelings connected with her loss. Objective Attend a grief/loss support group. Target Date: 2023-11-27 Frequency: Biweekly  Progress: 20 Modality: individual  Related Interventions Ask the client to attend a grief/loss support group and report to the therapist how he/she felt about attending. 3. Formulate and implement a new life attitudinal pattern that allows for a more relaxed pattern of living. Objective Describe the pattern of pressured, driven living. Target Date: 2023-11-27 Frequency: Biweekly  Progress: 20 Modality: individual  Related Interventions Assess examples of pressured lifestyle including associated situations, cognition, emotion, actions, and impact on client and others. Objective Verbalize a desire to reprioritize values toward less self-focus, more inner and other orientation. Target Date: 2023-11-27 Frequency: Biweekly  Progress: 0 Modality: individual  Related Interventions Explore and  clarify the client's value system and assist in developing new priorities on the  importance of relationships, recreation, spiritual growth, reflection time, giving to others (or assign "Developing Noncompetitive Values" in the Adult Psychotherapy Homework Planner by Stephannie Li). 4. Minimize ADD behavioral interference in daily life. Objective Cooperate with and complete psychological testing. Target Date: 2023-11-27 Frequency: Biweekly  Progress: 50 (appointment scheduled) Modality: individual  Related Interventions Conduct or arrange for psychological testing to further assess ADD, other possible psychopathology (e.g., anxiety, depression), and relevant rule-outs (e.g., ADHD, conduct/antisocial features); provide feedback of testing results. Objective Describe past and present experiences with ADD including its effects on functioning. Target Date: 2023-11-27 Frequency: Biweekly  Progress: 30 Modality: individual  Related Interventions Establish rapport with the client toward building a therapeutic alliance.  Diagnosis: Depression Major depressive disorder, recurrent. moderate Seasonal Affective Disorder Adjustment disorder with anxiety R/O ADHD  Plan:  -meet again on Monday, May 20, 2023 at 3pm.

## 2023-04-19 ENCOUNTER — Ambulatory Visit (INDEPENDENT_AMBULATORY_CARE_PROVIDER_SITE_OTHER): Payer: No Typology Code available for payment source | Admitting: Psychology

## 2023-04-19 DIAGNOSIS — F89 Unspecified disorder of psychological development: Secondary | ICD-10-CM | POA: Diagnosis not present

## 2023-04-19 NOTE — Progress Notes (Signed)
Date: 04/19/2023 Appointment Start Time: 9:05am Duration: 115 minutes Provider: Helmut Muster, PsyD Type of Session: Initial Appointment for Evaluation  Location of Patient: Home Location of Provider: Provider's Home (private office) Type of Contact: Caregility video visit with audio  Session Content:  Prior to proceeding with today's appointment, two pieces of identifying information were obtained from Stacey Huerta to verify identity. In addition, Stacey Huerta's physical location at the time of this appointment was obtained. In the event of technical difficulties, Stacey Huerta shared a phone number she could be reached at. Stacey Huerta and this provider participated in today's telepsychological service. Stacey Huerta denied anyone else being present in the room or on the virtual appointment.  The provider's role was explained to Stacey Huerta. The provider reviewed and discussed issues of confidentiality, privacy, and limits therein (e.g., reporting obligations). In addition to verbal informed consent, written informed consent for psychological services was obtained from Stacey Huerta prior to the initial appointment. Written consent included information concerning the practice, financial arrangements, and confidentiality and patients' rights. Since the clinic is not a 24/7 crisis center, mental health emergency resources were shared, and the provider explained e-mail, voicemail, and/or other messaging systems should be utilized only for non-emergency reasons. This provider also explained that information obtained during appointments will be placed in their electronic medical record in a confidential manner. Stacey Huerta verbally acknowledged understanding of the aforementioned and agreed to use mental health emergency resources discussed if needed. Moreover, Stacey Huerta agreed information may be shared with other Stacey Huerta or their referring provider(s) as needed for coordination of care. By signing the new patient documents, Stacey Huerta provided  written consent for coordination of care. Stacey Huerta verbally acknowledged understanding she is ultimately responsible for understanding her insurance benefits as it relates to reimbursement of telepsychological and in-person services. This provider also reviewed confidentiality, as it relates to telepsychological services, as well as the rationale for telepsychological services. This provider further explained that video should not be captured, photos should not be taken, nor should testing stimuli be copied or recorded as it would be a copyright violation. Stacey Huerta expressed understanding of the aforementioned, and verbally consented to proceed. Stacey Huerta is aware of the limitations of teleheath visits and verbally consented to proceed.  Stacey Huerta completed the neurobehavioral examination, which included obtaining a, family, social, and psychiatric history; integration of prior history and other sources of clinical data to assist with clinical decision making; behavioral observations; assessment of thinking, reasoning, and judgment; and establishment of a provisional diagnosis. The evaluation was completed in 115 minutes. Codes 16109 and [id number] were billed.   Mental Status Examination:  Appearance:  neat Behavior: appropriate to circumstances Mood: neutral Affect: mood congruent Speech: tangential  Eye Contact: appropriate Psychomotor Activity: restless Thought Process: denies suicidal, homicidal, and self-harm ideation, plan and intent Content/Perceptual Disturbances: none Orientation: AAOx4 Cognition/Sensorium: inattentive Insight: good Judgment: good  Provisional DSM-5 diagnosis(es):  F89 Unspecified Disorder of Psychological Development   Plan: Testing is expected to answer the question, does the individual meet criteria for ADHD when age, other mental health concerns, and cognitive functioning are taken into consideration? Further testing is warranted because a diagnosis cannot be given solely  based on current interview data (further data is required). Testing results are expected to answer the remaining diagnostic questions in order to provide an accurate diagnosis and assist in treatment planning with an expectation of improved clinical outcome. Stacey Huerta is currently scheduled for an appointment on 05/08/2023 at 3pm via Caregility video visit with audio.  Margarite Gouge, PsyD

## 2023-05-08 ENCOUNTER — Ambulatory Visit: Payer: No Typology Code available for payment source | Admitting: Psychology

## 2023-05-08 ENCOUNTER — Other Ambulatory Visit: Payer: Self-pay | Admitting: Internal Medicine

## 2023-05-08 DIAGNOSIS — F89 Unspecified disorder of psychological development: Secondary | ICD-10-CM

## 2023-05-08 DIAGNOSIS — M3500 Sicca syndrome, unspecified: Secondary | ICD-10-CM

## 2023-05-08 NOTE — Progress Notes (Signed)
Date: 05/08/2023   Appointment Start Time: 3:03pm Duration: 90 minutes Provider: Helmut Muster, PsyD Type of Session: Testing Appointment for Evaluation  Location of Patient: Home Location of Provider: Provider's Home (private office) Type of Contact: [employer] Teams video visit with audio  Session Content: Today's appointment was a telepsychological visit. Jasmain is aware it is her responsibility to secure confidentiality on her end of the session. Prior to proceeding with today's appointment, Dianely's physical location at the time of this appointment was obtained as well a phone number she could be reached at in the event of technical difficulties. Jullian denied anyone else being present in the room or on the virtual appointment. This provider reviewed that video should not be captured, photos should not be taken, nor should testing stimuli be copied or recorded as it would be a copyright violation. Zyan expressed understanding of the aforementioned, and verbally consented to proceed. The WAIS-IV was administered, scored, and interpreted by this evaluator. Yarexi is aware of the limitations of teleheath visits and verbally consented to proceed.  Billing codes will be input on the feedback appointment. There are no billing codes for the testing appointment.   Provisional DSM-5 diagnosis(es):  F89 Unspecified Disorder of Psychological Development   Plan: Reaghan was scheduled for a feedback appointment on 05/17/2023 at 11am via [employer] Teams video visit with audio.                Margarite Gouge, PsyD

## 2023-05-15 NOTE — Progress Notes (Signed)
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 82 >99 Plan/Organize 22  69 95 Task Monitor 12 65 96 Organization of Materials 17 61 90 Metacognition Index (MI) 90 72 97 Global Executive Composite (GEC) 147 68 93  Validity Scale Raw Score Cumulative Percentile Protocol Classification Negativity 1 0 - 98.5 Acceptable Infrequency 0 0 - 93.3 Acceptable Inconsistency 3 0 - 98.8 Acceptable  BEHAVIORAL FUNCTIONING   Patient Health Questionnaire-9 (PHQ-9): Ms. Parillo completed the PHQ-9, a self-report measure that assesses symptoms of depression. She scored 12/27, which indicates moderate depression.   Generalized Anxiety Disorder-7 (GAD-7): Ms. Urquidi completed the GAD-7, a self-report measure that assesses symptoms of anxiety. She scored 3/21, which indicates minimal anxiety.   Adult ADHD Self-Report Scale Symptom Checklist (ASRS): Ms. Dipinto reported the following symptoms as sometimes: fidgeting or squirming, difficulty relaxing, talking too much in social situations, and interrupting others when they are busy. She endorsed the following symptoms as occurring often: problems remembering appointments or obligations, struggling to concentrate on what people say even when they are speaking directly to her, and being distracted by noise around her. She endorsed the following symptoms as  very often: difficulty wrapping up final details of a project following the completion of challenging aspects, difficulty getting things in order when a task requires organization, avoiding or delaying getting started on tasks requiring a lot of thought, making careless mistakes when working on boring or difficult projects, struggling to sustain attention when doing boring or repetitive work, and misplacing or has difficulty finding things. Endorsement of at least four items in Part A is highly consistent with ADHD in adults. The frequency scores of Part B provides additional cues. Ms. Mcroy scored a 4/6 on Part A and 6/12 on Part B, which is considered a positive screening for ADHD.   Personality Assessment Inventory (PAI): The PAI is an objective inventory of adult personality. The validity indicators suggest Ms. Dowding's profile is interpretable (ICN T = 49, INF T = 40, NIM T = 44, and PIM T = 52). She is endorsing concerns about health functioning (SOM T = 68 and SOM-H T = 69) that include sensory and motor problems (SOM=C T = 63) and routine physical complaints (e.g., headaches, back problems, or gastrointestinal ailments; SOM-S T = 65); being very prone to anger (AGG-A T = 76 and BOR-A T = 66) and verbal outburst that are likely viewed as hostile and/or controlling (AGG-V T = 79 and DOM T = 72), which is likely at least partially attributable to a sensitivity to being slighted and tending to respond by holding grudges toward the offending parties (PAR-R T = 66); and concentration problems and indecisiveness (SCZ-T T = 64). She also endorsed being somewhat distant in personal relationships (WRM T = 37) but that she has close, generally supportive relationships with family and friends (NON T = 45). She appears to acknowledge significant difficulties in functioning and has the perception that help is needed in dealing with these problems (RXR T = 33).     SUMMARY AND CLINICAL IMPRESSIONS: Ms. Rina Adney is a 47 year old female who was referred by Dr. Arva Chafe for an evaluation to determine if she currently meets criteria for a diagnosis of Attention-Deficit/Hyperactivity Disorder (ADHD).   Ms. Sottile reported experiencing ADHD-related symptoms since childhood, although shared she attempted to mask the difficulties these caused which would sometimes cause her to "giv[e] up on things [she] wanted to do." She also reported her therapist recommended she pursue an ADHD evaluation, and that multiple stressors have occurred in recent years and appear to  have exacerbated her ADHD-related symptoms. She expressed a belief her ADHD-related symptoms are consistent and independent of mood, but can be worsened by stress, perimenopause, and depressed mood.   During the evaluation, Ms. Mochizuki was administered assessments to measure her current cognitive abilities. Her verbal comprehension abilities were in the very superior range, and she demonstrated the strongest performance on the Vocabulary subtest, which required her to explain the meaning of words presented in isolation. Additionally, performance on this subtest requires abilities to verbalize meaningful concepts, as well as retrieve information from long-term memory.  Her lowest performance was on the Similarities subtest, which measured her ability to abstract meaningful concepts and relationships from verbally presented material. Her ability to sustain attention, concentrate, and exert mental control was in the high average range which suggests a relative weakness in attention, concentration, mental control, and shorter-term auditory memory when compared to her verbal reasoning abilities. Moreover, her score on the Arithmetic subtest is higher than her score on Digit Span, which may specific strengths in arithmetic computational skills rather than a general proficiency in working memory. Results of the CNS Vital Signs indicated a low average  neurocognitive processing ability with impairment in simple attention, reaction time, complex attention, cognitive flexibility, and executive function; and strengths in visual memory, working memory, and sustained attention.   During the clinical interview and on self-report measures, Ms. Carriveau endorsed executive functioning concerns that include attentional dysregulation and hyperactivity- and impulsivity-related symptoms. She also endorsed meeting full criteria for ADHD. Moreover, her friend, Shaaron Adler, also indicated Ms. Hight is experiencing multiple executive functioning issues. When considering self-reported symptoms; endorsed and/or demonstrated impairment on measures of attention, reaction time, and executive functioning; her mental health provider reportedly expressing concerns that she meets criteria for a diagnosis of ADHD; and a possible familial history of ADHD, a diagnosis of F90.2 Attention-Deficit/Hyperactivity Disorder, Combined Presentation, Moderate appears warranted. The specifier of "Moderate" was assigned as she indicated her symptoms negatively impact her academic (e.g., regularly having trouble sustaining her attention during class, getting in trouble for "doing stuff [she] wasn't supposed to," and creating as sense of urgency to assist in "keeping [herself] focused" on tasks), occupational (e.g., "losing interest" in her employment if "they did not change," which would lead to reduced importance being placed on her employment performance), social (e.g., commonly engaging in excessive talking and having urges to interrupt), and daily (e.g., frequent forgetfulness and task initiation and completion issues as well as regularly being easily distracted) functioning.   Ms. Mohiuddin also endorsed a history of depressive episodes that occur primarily during the winter months and include anhedonia, irritability, low mood, and exacerbated concentration issues as well as somniloquy and  occasional sleep issues that she utilizes various sleep aids for (e.g., CBD gummies and Vitamin D), adding she experienced sleep impairment in the past and if she did not use sleep aids she would "possibly" have "sleep problems." As such, the PHQ-9, GAD-7, and PAI were administered. Her results suggested she experiences moderate severity depression-related symptomatology and minimal anxiety symptomatology. When considering the aforementioned and chart review information, it appears likely that she meets criteria for a depressive disorder; however, given the limited scope of this evaluation, this evaluator was unable to determine the severity. As a result, a diagnosis of F33.9 Major Depressive Disorder, Recurrent, Unspecified with Seasonal Pattern was given. Also due to the limited scope of the evaluation, it was unable to be determined if full criteria for a sleep-wake disorder is met. Thus, she would likely  benefit from further evaluation of these symptoms to definitively rule in or out a sleep-wake disorder.   DSM-5 Diagnostic Impressions: F90.2 Attention-Deficit/Hyperactivity Disorder, Combined Presentation, Moderate F33.9 Major Depressive Disorder, Recurrent, Unspecified with Seasonal Pattern  RECOMMENDATIONS: 1. Ms. Netherland would likely benefit from making use of strategies for ADHD symptoms:  a. Setting a timer to complete tasks. b. Breaking tasks into manageable chunks and spreading them out over longer periods of time with breaks.  c. Utilizing lists and day calendars to keep track of tasks.  d. Answering emails daily.  e. Improving listening skills by asking the speaker to give information in smaller chunks and asking for explanation for clarification as needed. f. Leaving more than the anticipated time to complete tasks. 2. It may help to keep tasks brief, well within your attention span, and a mix of both high and low interest tasks. Tasks may be gradually increased in length. 3. Practice  proactive planning by setting aside time every evening to plan for the next day (e.g., prepare needed materials or pack the car the night before).  4. Learn how to make an effective and reasonable "to do" list of important tasks and priorities and always keep it easily accessible. Make additional copies in case it is lost or misplaced. 5. Utilize visual reminders by posting appointments, "to do lists," or schedule in strategic areas at home and at work.  6. Practice using an appointment book, smart phone or other tech device, or a daily planning calendar, and learn to write down appointments and commitments immediately. 7. Keep notepads or use a portable audio recorder to capture important ideas that would be beneficial to recall later. 8. Learn and practice time management skills. Purchase a programmable alarm watch or set an alarm on smartphone to avoid losing track of time.  9. Use a color-coded file system, desk and closet organizers, storage boxes, or other organization devices to reduce clutter and improve efficiency and structure.  10. Implement ways to become more aware of your actions and to inhibit or adjust them as warranted (e.g., reviewing videos of your actions, consider consequences of obeying or not obeying the rules of various upcoming situations, have a trusted other to discuss plans with and/or provide cues to stop certain behaviors, and make visual cues for rules you would like to follow). 11. Stay flexible and be prepared to change your plans as symptom breakthroughs and crises are likely to occur periodically. 12. Ms. Losier may benefit from mindfulness training to address symptoms of inattention.  13. Ms. Legendre would likely benefit from a consultation regarding medication for ADHD symptoms.   14. Individual therapeutic services may assist in processing a diagnosis of ADHD and discussing coping and compensatory strategies. 15. Mental alertness/energy can be raised by increasing  exercise; improving sleep; eating a healthy diet; and managing depression and stress. Consulting with a physician regarding any changes to physical regimen is recommended. 16. "Failing at Normal: An ADHD Success Story" by Calvert Cantor is a great overview of ADHD. Dr. Janese Banks also has a YouTube channel with helpful videos on ADHD-related topics: http://www.mitchell-reyes.biz/ 17. Applications:   RescueTime. Tracks your activities on phone and/or computer to determine how productive you have been, and what distracted you. Free two week trial.   Focus@Will . Uses engineered audio that human voice-like frequencies. Free 15-day trial.  Freedom. Allows you to highlight days and times you want to block yourself from certain sites or apps. Free trial.  Mint.  Allows you to input  your bank accounts and creates a visual layout of various information about your financial goals, budget management, alerts, etc. Free.  Boomerang. Gives you the option to schedule times an email is sent as well as to see if others have received or opened your email. 10 messages free per month and a free trial of premium version.  IFTTT. Uses "channels" to create various actions (e.g., if you are mentioned in an email to highlight it in your inbox and if you miss a call to add it to a to-do list). Free and premium versions.  Unroll.me. Cleans up your email by unsubscribing from what you do not want to receive while still getting everything you do. Free.  Finish. Allows you to divide two-list tasks into short-term, mid-term, and long-term as well as how much time is left for a task. Focus mode hides non-priority tasks.   Autosilent. Turns your phone ringer on and off based on specified calendars, geo-fences, timers, etc. $3.99.  Freakyalarm. Makes you solve math problems to disable an alarm. $1.99.  Wake N Shake. Makes you vigorously shake your phone to stop the alarm. $.99.  Todoist. Allows you to add  sub-tasks to tasks as well as includes email and Web plugins to make it work across system. Premium has location-based reminders, calendar sync, productive tracking, etc.   Sleep Cycle. Utilizes your phone's motion sensors to pick up on movement while you are asleep. The alarm will wake you as early as 30 minutes before your alarm based on your lightest phase of sleep as well as showing you how daily activities affect your sleep quality.  18. Book:  "Taking Charge of Adult ADHD Second Edition" by Dr. Janese Banks 19. Organizations that are a good source of information on ADHD:   Children and Adults with Attention-Deficit/Hyperactivity Disorder (CHADD): chadd.org   Attention Deficit Disorder Association (ADDA): HotterNames.de  ADD Resources: addresources.org  ADD WareHouse: addwarehouse.com  World Federation of ADHD: adhd-federation.org  ADDConsults: FightListings.se.  Compilation of ADHD resources: https://www.harrell.com/ 20. Future evaluation if deemed necessary and/or to determine effectiveness of recommended interventions.   Helmut Muster, Psy.D. Licensed Psychologist - [facility] #[id number]              Margarite Gouge, PsyD

## 2023-05-17 ENCOUNTER — Ambulatory Visit (INDEPENDENT_AMBULATORY_CARE_PROVIDER_SITE_OTHER): Payer: No Typology Code available for payment source | Admitting: Psychology

## 2023-05-17 DIAGNOSIS — F339 Major depressive disorder, recurrent, unspecified: Secondary | ICD-10-CM

## 2023-05-17 DIAGNOSIS — F902 Attention-deficit hyperactivity disorder, combined type: Secondary | ICD-10-CM | POA: Diagnosis not present

## 2023-05-20 ENCOUNTER — Ambulatory Visit: Payer: No Typology Code available for payment source | Admitting: Professional

## 2023-07-09 ENCOUNTER — Telehealth: Payer: Self-pay | Admitting: Family Medicine

## 2023-07-09 NOTE — Telephone Encounter (Signed)
Pt dropped off form to be signed. Pt needs form by the 30th. Pt doesn't need PCP to sign any physician signature is okay.Please call pt when form is ready to be picked up

## 2023-07-09 NOTE — Telephone Encounter (Signed)
Form completed. Pt made aware and will send Mychart message regarding waist circumference. Pt will pick up form tomorrow.

## 2023-08-07 ENCOUNTER — Other Ambulatory Visit: Payer: Self-pay | Admitting: Internal Medicine

## 2023-08-07 DIAGNOSIS — M3500 Sicca syndrome, unspecified: Secondary | ICD-10-CM

## 2023-08-07 NOTE — Telephone Encounter (Signed)
Last Fill: 05/13/2023  Eye exam: not on file   Labs: 04/15/2023 CBC MPV 13.5  11/01/2022 CMP  Creatinine 1.28 GFR 49.52  Next Visit: 10/25/2023  Last Visit: 04/15/2023  EX:[id number] syndrome, with unspecified organ involvement   Current Dose per office note 04/15/2023:  continue hydroxychloroquine and encouraged to try sticking with 200 mg once daily   Attempted to contact the patient and left a message to call the office back regarding her PLQ eye exam and advised labs are due.   Okay to refill Plaquenil?

## 2023-09-22 DIAGNOSIS — I509 Heart failure, unspecified: Secondary | ICD-10-CM

## 2023-09-22 HISTORY — DX: Heart failure, unspecified: I50.9

## 2023-10-08 ENCOUNTER — Telehealth: Payer: Self-pay | Admitting: Family Medicine

## 2023-10-08 NOTE — Telephone Encounter (Signed)
FYI: This call has been transferred to triage nurse: the Triage Nurse. Once the result note has been entered staff can address the message at that time.  Patient called in with the following symptoms:  Red Word: SOB and Unexplained weight gain - 8 lbs in 2 days // Pt is concerned about fluid retention but reports no apparent swelling.    Please advise at Mobile [phone] (mobile)  Message is routed to Provider Pool.

## 2023-10-08 NOTE — Telephone Encounter (Signed)
Per the note pt advised she would go to the ED "later". I noticed patient hasn't gone yet. I called patient she advised her sxs are about the same and she hasn't checked her weight. Pt states she will go to the ED tomorrow. Pt was advised if sxs worsen to call 911 and to go the ED sooner rather later.

## 2023-10-08 NOTE — Telephone Encounter (Signed)
Initial Comment Caller states she wants an appt, has been dealing with shortness of breath for the past week. States any minor thing will cause shortness of breath, also has weight gain and feels bloated. Decreased appetite, unsure what could be going on. Translation No Nurse Assessment Nurse: Lily Kocher, RN, Adriana Date/Time (Eastern Time): 10/08/2023 10:09:39 AM Confirm and document reason for call. If symptomatic, describe symptoms. ---pt states that she is experiencing sob for a week. mostly with exertion but also during rest. has gained 8lb in last 2 days. Does the patient have any new or worsening symptoms? ---Yes Will a triage be completed? ---Yes Related visit to physician within the last 2 weeks? ---No Does the PT have any chronic conditions? (i.e. diabetes, asthma, this includes High risk factors for pregnancy, etc.) ---Yes List chronic conditions. ---sjorgren's Is the patient pregnant or possibly pregnant? (Ask all females between the ages of 26-55) ---No Is this a behavioral health or substance abuse call? ---No Guidelines Guideline Title Affirmed Question Affirmed Notes Nurse Date/Time (Eastern Time) Breathing Difficulty [1] MODERATE difficulty breathing (e.g., speaks in phrases, SOB even at rest, pulse 100-120) Lily Kocher, RN, Adriana 10/08/2023 10:11:53 AM PLEASE NOTE: All timestamps contained within this report are represented as [place] Standard Time. CONFIDENTIALTY NOTICE: This fax transmission is intended only for the addressee. It contains information that is legally privileged, confidential or otherwise protected from use or disclosure. If you are not the intended recipient, you are strictly prohibited from reviewing, disclosing, copying using or disseminating any of this information or taking any action in reliance on or regarding this information. If you have received this fax in error, please notify us immediately by telephone so that we can arrange for its  return to [place]. Phone: [phone], Toll-Free: [phone], Fax: [phone] Page: 2 of 2 Call Id: [id number] Guidelines Guideline Title Affirmed Question Affirmed Notes Nurse Date/Time Lamount Cohen Time) AND [2] NEW-onset or WORSE than normal Disp. Time Lamount Cohen Time) Disposition Final User 10/08/2023 10:03:07 AM Send to Urgent Queue Dennison Mascot 10/08/2023 10:15:38 AM Go to ED Now Yes Lily Kocher, RN, Adriana Final Disposition 10/08/2023 10:15:38 AM Go to ED Now Yes Lily Kocher, RN, Carlton Adam Disagree/Comply Comply Caller Understands Yes PreDisposition Call Doctor Care Advice Given Per Guideline GO TO ED NOW: CARE ADVICE given per Breathing Difficulty (Adult) guideline. NOTE TO TRIAGER - DRIVING: * Another adult should drive. Comments User: Jerilynn Birkenhead, RN Date/Time Lamount Cohen Time): 10/08/2023 10:16:06 AM pt will go to the hospital, but not now. states will go later. Referrals GO TO FACILITY UNDECIDED

## 2023-10-10 NOTE — Telephone Encounter (Signed)
Pt called. Unable to leave message

## 2023-10-11 ENCOUNTER — Encounter (HOSPITAL_BASED_OUTPATIENT_CLINIC_OR_DEPARTMENT_OTHER): Payer: Self-pay

## 2023-10-11 ENCOUNTER — Emergency Department (HOSPITAL_BASED_OUTPATIENT_CLINIC_OR_DEPARTMENT_OTHER): Payer: 59

## 2023-10-11 ENCOUNTER — Other Ambulatory Visit (HOSPITAL_BASED_OUTPATIENT_CLINIC_OR_DEPARTMENT_OTHER): Payer: Self-pay

## 2023-10-11 ENCOUNTER — Other Ambulatory Visit: Payer: Self-pay

## 2023-10-11 ENCOUNTER — Emergency Department (HOSPITAL_BASED_OUTPATIENT_CLINIC_OR_DEPARTMENT_OTHER)
Admission: EM | Admit: 2023-10-11 | Discharge: 2023-10-11 | Disposition: A | Discharge status: Home / Self Care | Payer: 59 | Attending: Emergency Medicine | Admitting: Emergency Medicine

## 2023-10-11 DIAGNOSIS — R0602 Shortness of breath: Secondary | ICD-10-CM | POA: Diagnosis present

## 2023-10-11 DIAGNOSIS — Z7982 Long term (current) use of aspirin: Secondary | ICD-10-CM | POA: Diagnosis not present

## 2023-10-11 DIAGNOSIS — I509 Heart failure, unspecified: Secondary | ICD-10-CM | POA: Insufficient documentation

## 2023-10-11 DIAGNOSIS — D649 Anemia, unspecified: Secondary | ICD-10-CM | POA: Insufficient documentation

## 2023-10-11 DIAGNOSIS — Z20822 Contact with and (suspected) exposure to covid-19: Secondary | ICD-10-CM | POA: Diagnosis not present

## 2023-10-11 DIAGNOSIS — D72819 Decreased white blood cell count, unspecified: Secondary | ICD-10-CM | POA: Diagnosis not present

## 2023-10-11 LAB — CBC WITH DIFFERENTIAL/PLATELET
Abs Immature Granulocytes: 0 10*3/uL (ref 0.00–0.07)
Basophils Absolute: 0 10*3/uL (ref 0.0–0.1)
Basophils Relative: 0 %
Eosinophils Absolute: 0 10*3/uL (ref 0.0–0.5)
Eosinophils Relative: 1 %
HCT: 32.6 % — ABNORMAL LOW (ref 36.0–46.0)
Hemoglobin: 10.9 g/dL — ABNORMAL LOW (ref 12.0–15.0)
Immature Granulocytes: 0 %
Lymphocytes Relative: 28 %
Lymphs Abs: 0.9 10*3/uL (ref 0.7–4.0)
MCH: 31.6 pg (ref 26.0–34.0)
MCHC: 33.4 g/dL (ref 30.0–36.0)
MCV: 94.5 fL (ref 80.0–100.0)
Monocytes Absolute: 0.3 10*3/uL (ref 0.1–1.0)
Monocytes Relative: 7 %
Neutro Abs: 2.1 10*3/uL (ref 1.7–7.7)
Neutrophils Relative %: 64 %
Platelets: 143 10*3/uL — ABNORMAL LOW (ref 150–400)
RBC: 3.45 MIL/uL — ABNORMAL LOW (ref 3.87–5.11)
RDW: 13.6 % (ref 11.5–15.5)
WBC: 3.4 10*3/uL — ABNORMAL LOW (ref 4.0–10.5)
nRBC: 0 % (ref 0.0–0.2)

## 2023-10-11 LAB — COMPREHENSIVE METABOLIC PANEL
ALT: 33 U/L (ref 0–44)
AST: 28 U/L (ref 15–41)
Albumin: 3.2 g/dL — ABNORMAL LOW (ref 3.5–5.0)
Alkaline Phosphatase: 78 U/L (ref 38–126)
Anion gap: 6 (ref 5–15)
BUN: 12 mg/dL (ref 6–20)
CO2: 22 mmol/L (ref 22–32)
Calcium: 8.7 mg/dL — ABNORMAL LOW (ref 8.9–10.3)
Chloride: 109 mmol/L (ref 98–111)
Creatinine, Ser: 0.94 mg/dL (ref 0.44–1.00)
GFR, Estimated: >60 mL/min (ref >60)
Glucose, Bld: 91 mg/dL (ref 70–99)
Potassium: 4.4 mmol/L (ref 3.5–5.1)
Sodium: 137 mmol/L (ref 135–145)
Total Bilirubin: 1.1 mg/dL (ref <1.2)
Total Protein: 6.8 g/dL (ref 6.5–8.1)

## 2023-10-11 LAB — RESP PANEL BY RT-PCR (RSV, FLU A&B, COVID)  RVPGX2
Influenza A by PCR: NEGATIVE
Influenza B by PCR: NEGATIVE
Resp Syncytial Virus by PCR: NEGATIVE
SARS Coronavirus 2 by RT PCR: NEGATIVE

## 2023-10-11 LAB — BRAIN NATRIURETIC PEPTIDE: B Natriuretic Peptide: 584 pg/mL — ABNORMAL HIGH (ref 0.0–100.0)

## 2023-10-11 MED ORDER — POTASSIUM CHLORIDE CRYS ER 20 MEQ PO TBCR
20.0000 meq | EXTENDED_RELEASE_TABLET | Freq: Every day | ORAL | 0 refills | Status: DC
  Filled 2023-10-11: qty 30, 30d supply, fill #0

## 2023-10-11 MED ORDER — FUROSEMIDE 10 MG/ML IJ SOLN
40.0000 mg | Freq: Once | INTRAMUSCULAR | Status: AC
  Administered 2023-10-11: 40 mg via INTRAVENOUS
  Filled 2023-10-11: qty 4

## 2023-10-11 MED ORDER — FUROSEMIDE 40 MG PO TABS
40.0000 mg | ORAL_TABLET | Freq: Every day | ORAL | 0 refills | Status: DC
  Filled 2023-10-11: qty 30, 30d supply, fill #0

## 2023-10-11 NOTE — ED Provider Notes (Signed)
[facility] [facility] Provider Note   CSN: [id number] Arrival date & time: 10/11/23  4098     History Chief Complaint  Patient presents with   Shortness of Breath    Stacey Huerta is a 47 y.o. female.  Patient with past history significant for morbid obesity, preeclampsia, Sjogren syndrome, thrombocytopenia presents the emergency department concerns of shortness of breath.  She reports having some level of shortness of breath for the last week or so typically worse with exertion.  Denies any nausea, vomiting, fevers, pain, but does endorse some possible increasing swelling in the abdomen.  Not currently on any birth control or hormonal medication, no recent surgeries, no leg swelling, no hemoptysis, no prior history of PE or DVT.   Shortness of Breath      Home Medications Prior to Admission medications   Medication Sig Start Date End Date Taking? Authorizing Provider  furosemide (LASIX) 40 MG tablet Take 1 tablet (40 mg total) by mouth daily. 10/11/23 11/10/23 Yes Maryanna Shape A, PA-C  potassium chloride SA (KLOR-CON M) 20 MEQ tablet Take 1 tablet (20 mEq total) by mouth daily for 30 doses. 10/11/23 11/10/23 Yes Smitty Knudsen, PA-C  aspirin EC 81 MG tablet Take 1 tablet (81 mg total) by mouth daily. 10/28/15   Seabron Spates R, DO  B Complex-C-Folic Acid (B-COMPLEX BALANCED PO) Take by mouth. Patient not taking: Reported on 02/12/2023    [provider]  hydroxychloroquine (PLAQUENIL) 200 MG tablet TAKE 1 TABLET BY MOUTH EVERY DAY 08/07/23   Rice, Jamesetta Orleans, MD  MAGNESIUM PO Take 1 tablet by mouth daily.    [provider]  Naltrexone HCl, Pain, 4.5 MG CAPS 1 po qd 11/07/22   Zola Button, Grayling Congress, DO  Omega-3 Fatty Acids (FISH OIL) 1000 MG CAPS Take by mouth.    [provider]  Probiotic Product (PROBIOTIC-10 PO) Take by mouth.    [provider]  UBIQUINONE PO Take 100 mg by mouth.    [provider]  VITAMIN D PO Take by mouth.    [provider]  Vitamin D, Ergocalciferol, (DRISDOL) 1.25 MG (50000 UNIT) CAPS capsule Take 1 capsule (50,000 Units total) by mouth every 7 (seven) days. Patient not taking: Reported on 02/12/2023 08/18/20   Alois Cliche, PA-C  VITAMIN E PO Take 1 tablet by mouth daily.    [provider]  zinc gluconate 50 MG tablet Take 50 mg by mouth daily.    [provider]      Allergies    Egg-derived products, Metformin and related, Sulfonamide derivatives, [place], Other, Penicillins, Reishi (ganoderma lucidum) (do not select), Shellfish allergy, and Fish-derived products    Review of Systems   Review of Systems  Respiratory:  Positive for shortness of breath.   All other systems reviewed and are negative.   Physical Exam Updated Vital Signs BP (!) 147/93   Pulse 92   Temp 97.9 F (36.6 C) (Oral)   Resp 18   Ht 5\' 10"  (1.778 m)   Wt (!) 147.4 kg   SpO2 100%   BMI 46.63 kg/m  Physical Exam Vitals and nursing note reviewed.  Constitutional:      General: She is not in acute distress.    Appearance: She is well-developed.  HENT:     Head: Normocephalic and atraumatic.  Eyes:     Conjunctiva/sclera: Conjunctivae normal.  Cardiovascular:     Rate and Rhythm: Normal  rate and regular rhythm.     Heart sounds: No murmur heard. Pulmonary:     Effort: Pulmonary effort is normal. No respiratory distress.     Breath sounds: Normal breath sounds. No decreased breath sounds, wheezing, rhonchi or rales.  Abdominal:     Palpations: Abdomen is soft.     Tenderness: There is no abdominal tenderness.  Musculoskeletal:        General: No swelling.     Cervical back: Neck supple.  Skin:    General: Skin is warm and dry.     Capillary Refill: Capillary refill takes less than 2 seconds.  Neurological:     Mental Status: She is alert.  Psychiatric:        Mood and Affect: Mood normal.     ED Results / Procedures  / Treatments   Labs (all labs ordered are listed, but only abnormal results are displayed) Labs Reviewed  CBC WITH DIFFERENTIAL/PLATELET - Abnormal; Notable for the following components:      Result Value   WBC 3.4 (*)    RBC 3.45 (*)    Hemoglobin 10.9 (*)    HCT 32.6 (*)    Platelets 143 (*)    All other components within normal limits  COMPREHENSIVE METABOLIC PANEL - Abnormal; Notable for the following components:   Calcium 8.7 (*)    Albumin 3.2 (*)    All other components within normal limits  BRAIN NATRIURETIC PEPTIDE - Abnormal; Notable for the following components:   B Natriuretic Peptide 584.0 (*)    All other components within normal limits  RESP PANEL BY RT-PCR (RSV, FLU A&B, COVID)  [id number]    EKG EKG Interpretation Date/Time:  Friday October 11 2023 10:04:27 EST Ventricular Rate:  93 PR Interval:  270 QRS Duration:  92 QT Interval:  341 QTC Calculation: 425 R Axis:   68  Text Interpretation: Sinus rhythm Prolonged PR interval Left atrial enlargement Low voltage, precordial leads Borderline T abnormalities, diffuse leads Confirmed by Fulton Reek [phone]) on 10/11/2023 10:11:57 AM  Radiology DG Chest 2 View Result Date: 10/11/2023 CLINICAL DATA:  Cough and shortness of breath. EXAM: CHEST - 2 VIEW COMPARISON:  Chest radiograph 09/09/2014 FINDINGS: Monitoring leads overlie the patient. Cardiomegaly. No large area pulmonary consolidation. No pleural effusion or pneumothorax. Osseous structures unremarkable. IMPRESSION: Cardiomegaly. No acute cardiopulmonary process. Electronically Signed   By: Annia Belt M.D.   On: 10/11/2023 12:16    Procedures Procedures   Medications Ordered in ED Medications  furosemide (LASIX) injection 40 mg (40 mg Intravenous Given 10/11/23 1241)    ED Course/ Medical Decision Making/ A&P                                 Medical Decision Making Amount and/or Complexity of Data Reviewed Labs: ordered. Radiology:  ordered.  Risk Prescription drug management.   This patient presents to the ED for concern of shortness of breath.  Differential diagnosis includes viral URI, pneumonia, bronchitis, PE, ACS   Lab Tests:  I Ordered, and personally interpreted labs.  The pertinent results include: CBC shows mild leukopenia 3.4 and some anemia seen with decreases in RBC, hemoglobin and hematocrit.  CMP unremarkable.  Respiratory panel negative for COVID-19, influenza and RSV.  BNP elevated at 584.   Imaging Studies ordered:  I ordered imaging studies including chest x-ray I independently visualized and interpreted imaging which showed cardiomegaly but no other  acute concerns I agree with the radiologist interpretation   Medicines ordered and prescription drug management:  I ordered medication including Lasix for CHF Reevaluation of the patient after these medicines showed that the patient improved I have reviewed the patients home medicines and have made adjustments as needed   Problem List / ED Course:  Patient with past history significant for morbid obesity, preeclampsia, Sjogren syndrome, thrombocytopenia presents the emergency department with concerns of shortness of breath.  She reports that the shortness of breath has been ongoing for the last week or so and is typically worsened with exertion.  She denies any nausea vomiting, fevers, pain but does also endorse some possible abdominal swelling.  She denies abdominal tenderness, nausea, vomiting, diarrhea.  Does endorse that she is having more frequent bowel movements that are slightly more loose which is not typical for her.  Basic labs ordered for assessment of symptoms.  Patient is PERC negative so D-dimer not ordered given vitals are unremarkable. Patient allows for concern for possible CHF with BNP elevated at 584.  Patient has no history of CHF less likely be new onset CHF.  Will discuss patient care with cardiology. Spoke with Dr. Elease Hashimoto,  cardiology, who advised patient can be discharged home with outpatient cardiology follow up for CHF. Advised starting patient on Lasix 40mg  and potassium chloride . Informed patient of recommendations.  Patient symptomatically improved after ministration of IV Lasix.  No hypoxia to the emergency department the patient is reasonably stable for outpatient follow-up.  Patient discharged home in stable condition.  Final Clinical Impression(s) / ED Diagnoses Final diagnoses:  Acute congestive heart failure, unspecified heart failure type (HCC)    Rx / DC Orders ED Discharge Orders          Ordered    furosemide (LASIX) 40 MG tablet  Daily        10/11/23 1427    potassium chloride SA (KLOR-CON M) 20 MEQ tablet  Daily        10/11/23 1427    Ambulatory referral to Cardiology       Comments: If you have not heard from the Cardiology office within the next 72 hours please call [phone].   10/11/23 1429              Smitty Knudsen, PA-C 10/11/23 1431    Laurence Spates, MD 10/12/23 1204

## 2023-10-11 NOTE — Discharge Instructions (Signed)
You were seen in the ER today for concerns of shortness of breath. Your labs were concerning for development of congestive heart failure based on your BNP level. I spoke with Dr. Elease Hashimoto who is a cardiologist who advised having you follow up in clinic outpatient. I have sent a prescription fro Lasix and potassium to your pharmacy. Please take these as prescribed. Return to the ER if symptoms are worsening.

## 2023-10-11 NOTE — ED Triage Notes (Signed)
Patient here POV from Home.  Endorses SOB for about a week.   No N/V. No Pain. No Known fevers. Some Cough noted with Dyspnea.   NAD noted during Triage. A&Ox4. Gcs 15. Ambulatory.

## 2023-10-25 ENCOUNTER — Ambulatory Visit: Payer: No Typology Code available for payment source | Admitting: Internal Medicine

## 2023-11-07 ENCOUNTER — Other Ambulatory Visit: Payer: Self-pay

## 2023-11-07 DIAGNOSIS — D219 Benign neoplasm of connective and other soft tissue, unspecified: Secondary | ICD-10-CM | POA: Insufficient documentation

## 2023-11-07 DIAGNOSIS — M1990 Unspecified osteoarthritis, unspecified site: Secondary | ICD-10-CM | POA: Insufficient documentation

## 2023-11-07 DIAGNOSIS — F53 Postpartum depression: Secondary | ICD-10-CM | POA: Insufficient documentation

## 2023-11-07 DIAGNOSIS — R600 Localized edema: Secondary | ICD-10-CM | POA: Insufficient documentation

## 2023-11-07 DIAGNOSIS — R0600 Dyspnea, unspecified: Secondary | ICD-10-CM | POA: Insufficient documentation

## 2023-11-07 DIAGNOSIS — N979 Female infertility, unspecified: Secondary | ICD-10-CM | POA: Insufficient documentation

## 2023-11-07 DIAGNOSIS — T7840XA Allergy, unspecified, initial encounter: Secondary | ICD-10-CM | POA: Insufficient documentation

## 2023-11-07 DIAGNOSIS — M549 Dorsalgia, unspecified: Secondary | ICD-10-CM | POA: Insufficient documentation

## 2023-11-12 ENCOUNTER — Ambulatory Visit: Payer: 59

## 2023-11-14 ENCOUNTER — Other Ambulatory Visit: Payer: Self-pay | Admitting: Internal Medicine

## 2023-11-14 DIAGNOSIS — M3500 Sicca syndrome, unspecified: Secondary | ICD-10-CM

## 2023-11-14 NOTE — Telephone Encounter (Signed)
Last Fill: 08/07/2023  Eye exam: not on file    Labs: 10/11/2023 Calcium 8.7 Albumin 3.2 WBC 3.4 RBC 3.45 Hemoglobin 10.9 HCT 32.6 Platelets 143  Next Visit: 12/17/2023  Last Visit: 04/15/2023  WU:[id number]'N syndrome, with unspecified organ involvement   Current Dose per office note 04/15/2023:  hydroxychloroquine and encouraged to try sticking with 200 mg once daily   Patient states she has an eye exam done at the [facility] in [facility] and will have them fax [place] the results.  Okay to refill Plaquenil?

## 2023-11-15 ENCOUNTER — Encounter (HOSPITAL_BASED_OUTPATIENT_CLINIC_OR_DEPARTMENT_OTHER): Payer: Self-pay

## 2023-11-20 ENCOUNTER — Ambulatory Visit (HOSPITAL_BASED_OUTPATIENT_CLINIC_OR_DEPARTMENT_OTHER): Payer: 59 | Admitting: Cardiology

## 2023-11-20 ENCOUNTER — Encounter (HOSPITAL_BASED_OUTPATIENT_CLINIC_OR_DEPARTMENT_OTHER): Payer: Self-pay | Admitting: Cardiology

## 2023-11-20 VITALS — BP 118/84 | HR 103 | Ht 70.0 in | Wt 321.4 lb

## 2023-11-20 DIAGNOSIS — I429 Cardiomyopathy, unspecified: Secondary | ICD-10-CM

## 2023-11-20 DIAGNOSIS — Z8759 Personal history of other complications of pregnancy, childbirth and the puerperium: Secondary | ICD-10-CM | POA: Diagnosis not present

## 2023-11-20 DIAGNOSIS — I509 Heart failure, unspecified: Secondary | ICD-10-CM | POA: Diagnosis not present

## 2023-11-20 DIAGNOSIS — Z7189 Other specified counseling: Secondary | ICD-10-CM

## 2023-11-20 DIAGNOSIS — R7989 Other specified abnormal findings of blood chemistry: Secondary | ICD-10-CM

## 2023-11-20 MED ORDER — FUROSEMIDE 40 MG PO TABS: 40.0000 mg | ORAL_TABLET | Freq: Every day | ORAL | 11 refills | Status: DC | PRN

## 2023-11-20 NOTE — Patient Instructions (Addendum)
Medication Instructions:  Your physician recommends that you continue on your current medications as directed. Please refer to the Current Medication list given to you today.  *If you need a refill on your cardiac medications before your next appointment, please call your pharmacy*   Testing/Procedures: Your physician has requested that you have an echocardiogram. Echocardiography is a painless test that uses sound waves to create images of your heart. It provides your doctor with information about the size and shape of your heart and how well your heart's chambers and valves are working. This procedure takes approximately one hour. There are no restrictions for this procedure. Please do NOT wear cologne, perfume, aftershave, or lotions (deodorant is allowed). Please arrive 15 minutes prior to your appointment time.  Please note: We ask at that you not bring children with you during ultrasound (echo/ vascular) testing. Due to room size and safety concerns, children are not allowed in the ultrasound rooms during exams. Our front office staff cannot provide observation of children in our lobby area while testing is being conducted. An adult accompanying a patient to their appointment will only be allowed in the ultrasound room at the discretion of the ultrasound technician under special circumstances. We apologize for any inconvenience.  ECHO @ Dec 17, 2023 @ 9:00 am  Follow-Up: At [facility], you and your health needs are our priority.  As part of our continuing mission to provide you with exceptional heart care, we have created designated Provider Care Teams.  These Care Teams include your primary Cardiologist (physician) and Advanced Practice Providers (APPs -  Physician Assistants and Nurse Practitioners) who all work together to provide you with the care you need, when you need it.  We recommend signing up for the patient portal called "MyChart".  Sign up information is provided on  this After Visit Summary.  MyChart is used to connect with patients for Virtual Visits (Telemedicine).  Patients are able to view lab/test results, encounter notes, upcoming appointments, etc.  Non-urgent messages can be sent to your provider as well.   To learn more about what you can do with MyChart, go to [email or web address].    Your next appointment:   December 23, 2023 @ 10:05 am  Provider:   Gillian Shields, NP    Other Instructions

## 2023-11-20 NOTE — Progress Notes (Incomplete)
Cardiology Office Note:  .   Date:  11/20/2023  ID:  Stacey Huerta, DOB 1976-01-30, MRN [id number] PCP: Zola Button, Grayling Congress, DO  [facility] Providers Cardiologist:  None {  History of Present Illness: Stacey Huerta is a 48 y.o. female with history of severe pre-eclampsia/HELLP syndrome, Sjogren's syndrome.  Today:  In her 69s, was having frequent syncope spells. Saw cardiology, had at least ECG/orthostatics, not clear about echo, but told that she was ok. In her 30s and 40s, no longer passing out, but periodically blood pressure would drop and she would get headaches/be symptomatic. At age 70, had severe preeclampsia/HELLP syndrome and had severely elevated blood pressures. Had 60 lbs of fluid on. Delivered at [facility] hospital. 6 weeks after delivery, stopped fluid pills and iron, blood pressure normalized.  She is active, works out a lot. Was doing very well until December of this year. Was having shortness of breath with exertion, was retaining fluid (gaining 3-5 lbs/day). Seen in the ER, had BNP drawn which was elevated at 584. CXR showed cardiomegaly and no acute process. No significant edema on x ray.  Has been taking diuretics since the ER visit, feels better with this.  No family history of heart issues.  Has had several weeks of nasal congestion, but no fevers/chills.  Reviewed history. Had echo in 2014 (with syncope) with EF 45-50%. No records of having echo/eval after her pregnancy.  Nonsmoker but with passive exposure. Occasional alcohol. No other drugs  ROS: Denies chest pain, shortness of breath at rest or with normal exertion. No PND, orthopnea, LE edema or unexpected weight gain. No syncope or palpitations. ROS otherwise negative except as noted.   Studies Reviewed: Marland Kitchen    EKG:       Physical Exam:   VS:  There were no vitals taken for this visit.   Wt Readings from Last 3 Encounters:  10/11/23 (!) 325 lb (147.4 kg)  04/15/23 (!) 319 lb  (144.7 kg)  02/12/23 (!) 321 lb (145.6 kg)    GEN: Well nourished, well developed in no acute distress HEENT: Normal, moist mucous membranes NECK: No JVD CARDIAC: regular rhythm, normal S1 and S2, no rubs or gallops. No murmur. VASCULAR: Radial and DP pulses 2+ bilaterally. No carotid bruits RESPIRATORY:  Clear to auscultation without rales, wheezing or rhonchi  ABDOMEN: Soft, non-tender, non-distended MUSCULOSKELETAL:  Ambulates independently SKIN: Warm and dry, no edema NEUROLOGIC:  Alert and oriented x 3. No focal neuro deficits noted. PSYCHIATRIC:  Normal affect    ASSESSMENT AND PLAN: .     CV risk counseling and prevention -recommend heart healthy/Mediterranean diet, with whole grains, fruits, vegetable, fish, lean meats, nuts, and olive oil. Limit salt. -recommend moderate walking, 3-5 times/week for 30-50 minutes each session. Aim for at least 150 minutes.week. Goal should be pace of 3 miles/hours, or walking 1.5 miles in 30 minutes -recommend avoidance of tobacco products. Avoid excess alcohol. -ASCVD risk score: The 10-year ASCVD risk score (Arnett DK, et al., 2019) is: 4.7%   Values used to calculate the score:     Age: 44 years     Sex: Female     Is Non-Hispanic African American: Yes     Diabetic: No     Tobacco smoker: No     Systolic Blood Pressure: 147 mmHg     Is BP treated: No     HDL Cholesterol: 39 mg/dL     Total Cholesterol: 186 mg/dL  Dispo: ***  Signed, Jodelle Red, MD   Jodelle Red, MD, PhD, [facility] [place]  [facility]    Heart & Vascular at [facility] at [facility] [address], [address] [place], [place] [address] [phone]

## 2023-12-03 ENCOUNTER — Ambulatory Visit: Payer: 59 | Admitting: Cardiology

## 2023-12-03 NOTE — Progress Notes (Unsigned)
 Office Visit Note  Patient: Stacey Huerta             Date of Birth: [birth year]           MRN: [id number]             PCP: Donato Schultz, DO Referring: Donato Schultz, * Visit Date: 12/17/2023   Subjective:  Follow-up   History of Present Illness: Stacey Huerta is a 48 y.o. female here for follow up for primary Sjogren's syndrome after starting hydroxychloroquine last visit.    Crepitus Dry skin Rest good ***  Previous HPI 04/15/2023 Stacey Huerta is a 48 y.o. female here for follow up for primary Sjogren's syndrome after starting hydroxychloroquine last visit.  She is only taking this 200 mg once daily and reports missing doses on some days because she has trouble remembering medicines as she does not usually take a lot.  Feels her joint pain and stiffness overall is noticeably improved.  Especially benefit with the hips and in her hands still has some knee crepitus and pain with use.  She has had some nausea type stomach irritation but not sure if it is related to the medication.  Has not seen appreciable difference with dry eyes and dry mouth.   Previous HPI 02/12/23 Stacey Huerta is a 48 y.o. female here for evaluation and management of sjogren syndrome. Previously saw Dr. Sharmon Revere in 2016 associated with positive RNP and SSA Abs, raynauds and xerostomia.  Dryness symptoms have been pretty consistently present they recently eyes are doing okay is able to wear contacts.  Dental problems are new needing several teeth pulled which has not been the case for her in the past and without any other specific change to account for this. Joint pains are mostly from the hips down does not have chronic issues with her upper body.  Previously symptoms were doing well when taking fertility treatments including dexamethasone and aspirin.  Was also previously on naltrexone with the benefit for arthritis pain.  She has had intra-articular steroid injections for  knee osteoarthritis most recently had viscosupplementation in the left knee last year.  She has never been on any drug specifically for Sjogren syndrome associated arthritis. Gets rashes easily on sun exposed areas. Also notices easy bruising often without recalling any impact to the site.  No history of significant abnormal bleeding or blood clots. Discoloration in fingers with cold exposure but never associated with any lesions or skin peeling.   Review of Systems  Constitutional:  Positive for fatigue.  HENT:  Positive for mouth dryness. Negative for mouth sores.   Eyes:  Positive for dryness.  Respiratory:  Positive for shortness of breath.   Cardiovascular:  Positive for palpitations. Negative for chest pain.  Gastrointestinal:  Negative for blood in stool, constipation and diarrhea.  Endocrine: Negative for increased urination.  Genitourinary:  Negative for involuntary urination.  Musculoskeletal:  Positive for joint pain, gait problem and joint pain. Negative for joint swelling, myalgias, muscle weakness, morning stiffness, muscle tenderness and myalgias.  Skin:  Positive for color change and sensitivity to sunlight. Negative for rash and hair loss.  Allergic/Immunologic: Positive for susceptible to infections.  Neurological:  Negative for dizziness and headaches.  Hematological:  Negative for swollen glands.  Psychiatric/Behavioral:  Negative for depressed mood and sleep disturbance. The patient is not nervous/anxious.     PMFS History:  Patient Active Problem List   Diagnosis Date Noted   Allergy  Back pain    Dyspnea    Fibroid    Infertility, female    Leg edema    Osteoarthritis    Postpartum depression    Xerophthalmia 04/15/2023   Xerostomia 02/12/2023   Bilateral primary osteoarthritis of knee 02/12/2023   Seasonal affective disorder (HCC) 12/05/2022   Adjustment disorder with anxiety 12/05/2022   Major depressive disorder, recurrent episode, moderate (HCC)  11/21/2022   Preventative health care 11/01/2022   Depressive episode 11/01/2022   Lack of concentration 11/01/2022   Vitamin B12 deficiency 11/01/2022   Other fatigue 12/08/2019   Pansinusitis 12/23/2018   Influenza 12/23/2018   Other hyperlipidemia 10/08/2018   Insulin resistance 10/08/2018   Vitamin D deficiency 10/08/2018   Hyperglycemia 12/19/2015   Sjogren's syndrome (HCC) 10/28/2015   Dysphoric mood 12/14/2014   Preterm infant, birth weight 500-749 grams, with 24 completed weeks of gestation 03/21/2014   Preeclampsia 03/16/2014   Morbid obesity (HCC) 03/16/2014   Thrombocytopenia, unspecified (HCC) 03/16/2014   Urticaria due to food allergy 09/28/2012   CHOLECYSTECTOMY, LAPAROSCOPIC, HX OF 08/06/2008   OBESITY, UNSPECIFIED 04/06/2008    Past Medical History:  Diagnosis Date   Adjustment disorder with anxiety 12/05/2022   Allergy    Back pain    Bilateral primary osteoarthritis of knee 02/12/2023   Depressive episode 11/01/2022   Dysphoric mood 12/14/2014   Dyspnea    Fibroid    Hyperglycemia 12/19/2015   Infertility, female    Influenza 12/23/2018   Insulin resistance 10/08/2018   Lack of concentration 11/01/2022   Leg edema    Major depressive disorder, recurrent episode, moderate (HCC) 11/21/2022   Morbid obesity (HCC) 03/16/2014   OBESITY, UNSPECIFIED 04/06/2008   Qualifier: Diagnosis of   By: Janit Bern         Osteoarthritis    Other fatigue 12/08/2019   Other hyperlipidemia 10/08/2018   Pansinusitis 12/23/2018   Postpartum depression    Preeclampsia 03/16/2014   Preventative health care 11/01/2022   Seasonal affective disorder (HCC) 12/05/2022   Sjogren's syndrome (HCC) 10/28/2015   Thrombocytopenia, unspecified (HCC) 03/16/2014   Urticaria due to food allergy 09/28/2012   Allergy Profile 07/23/12- specific elevations for dust mite, dog dander, cockroach and some weed pollens.  Food IgG profile- elevated to all agents tested, as has been my  experience with this test.   Allergy Profile 09/15/12- Total IgE 536.8, specific elevations for orange, chicken, apple, shrimp, tuna  ANA 09/15/12- Pos speckled 1:160  ESR 23        Vitamin B12 deficiency 11/01/2022   Vitamin D deficiency    Xerophthalmia 04/15/2023   Xerostomia 02/12/2023    Family History  Problem Relation Age of Onset   Hypertension Paternal Grandfather    Diabetes Paternal Grandfather    Diabetes Father    Hypotension Father    Colon cancer Paternal Grandmother    Diabetes Paternal Grandmother    Prostate cancer Maternal Grandfather    Hyperlipidemia Mother    Obesity Mother    Breast cancer Maternal Aunt    Arthritis Paternal Aunt    Hyperlipidemia Maternal Aunt        x's 2   Esophageal cancer Neg Hx    Stomach cancer Neg Hx    Past Surgical History:  Procedure Laterality Date   ABDOMINAL ADHESION SURGERY     BUNIONECTOMY Right 10/2020   CESAREAN SECTION N/A 03/18/2014   Procedure: CESAREAN SECTION;  Surgeon: Purcell Nails, MD;  Location: [facility];  Service:  Obstetrics;  Laterality: N/A;  spinal/epidural   CHOLECYSTECTOMY     ENDOMETRIAL ABLATION     LYMPHADENECTOMY     Removed from the neck at age 9   MYOMECTOMY     MYOMECTOMY  05/23/2012   Procedure: MYOMECTOMY;  Surgeon: Fermin Schwab, MD;  Location: [facility];  Service: Gynecology;  Laterality: N/A;   Social History   Social History Narrative   Nurse, adult 3 days a week--- [employer]   Immunization History  Administered Date(s) Administered   Hepatitis A 04/30/2007, 10/29/2007   Hepatitis B 05/07/2007, 06/04/2007, 11/05/2007   Td 05/07/2007   Tdap 11/01/2020     Objective: Vital Signs: BP 126/84 (BP Location: Left Arm, Patient Position: Sitting, Cuff Size: Normal)   Pulse (!) 101   Resp 14   Ht 5\' 10"  (1.778 m)   Wt (!) 323 lb (146.5 kg)   LMP 11/12/2023   BMI 46.35 kg/m    Physical Exam   Musculoskeletal Exam: ***  CDAI Exam: CDAI Score: -- Patient  Global: --; Provider Global: -- Swollen: --; Tender: -- Joint Exam 12/17/2023   No joint exam has been documented for this visit   There is currently no information documented on the homunculus. Go to the Rheumatology activity and complete the homunculus joint exam.  Investigation: No additional findings.  Imaging: No results found.  Recent Labs: Lab Results  Component Value Date   WBC 3.4 (L) 10/11/2023   HGB 10.9 (L) 10/11/2023   PLT 143 (L) 10/11/2023   NA 137 10/11/2023   K 4.4 10/11/2023   CL 109 10/11/2023   CO2 22 10/11/2023   GLUCOSE 91 10/11/2023   BUN 12 10/11/2023   CREATININE 0.94 10/11/2023   BILITOT 1.1 10/11/2023   ALKPHOS 78 10/11/2023   AST 28 10/11/2023   ALT 33 10/11/2023   PROT 6.8 10/11/2023   ALBUMIN 3.2 (L) 10/11/2023   CALCIUM 8.7 (L) 10/11/2023   GFRAA 75 05/12/2020    Speciality Comments: PLQ Eye Exam: Patient states she has an eye exam done at the [facility] in [facility] and will have them fax [place] the results.  Procedures:  No procedures performed Allergies: [place] nut (berthollefia excelsa), Egg-derived products, Metformin and related, Shiitake mushroom (obsolete), Sulfonamide derivatives, [place], Mushroom, Mushroom extract complex (obsolete), Penicillins, Reishi (ganoderma lucidum) (obsolete), Shellfish allergy, and Fish-derived products   Assessment / Plan:     Visit Diagnoses: Sjogren's syndrome, with unspecified organ involvement (HCC)  High risk medication use - Hydroxychloroquine 200 mg daily, Needs a PLQ eye exam.  Xerostomia - If not getting any better could try addition of [place].  Xerophthalmia  Bilateral primary osteoarthritis of knee  ***  Orders: No orders of the defined types were placed in this encounter.  No orders of the defined types were placed in this encounter.    Follow-Up Instructions: No follow-ups on file.   Fuller Plan, MD  Note - This record has been created using [employer].   Chart creation errors have been sought, but may not always  have been located. Such creation errors do not reflect on  the standard of medical care.

## 2023-12-17 ENCOUNTER — Ambulatory Visit: Payer: 59 | Attending: Internal Medicine | Admitting: Internal Medicine

## 2023-12-17 ENCOUNTER — Encounter: Payer: Self-pay | Admitting: Internal Medicine

## 2023-12-17 ENCOUNTER — Ambulatory Visit (HOSPITAL_BASED_OUTPATIENT_CLINIC_OR_DEPARTMENT_OTHER): Payer: 59

## 2023-12-17 VITALS — BP 126/84 | HR 101 | Resp 14 | Ht 70.0 in | Wt 323.0 lb

## 2023-12-17 DIAGNOSIS — Z79899 Other long term (current) drug therapy: Secondary | ICD-10-CM

## 2023-12-17 DIAGNOSIS — E507 Other ocular manifestations of vitamin A deficiency: Secondary | ICD-10-CM | POA: Diagnosis not present

## 2023-12-17 DIAGNOSIS — I509 Heart failure, unspecified: Secondary | ICD-10-CM

## 2023-12-17 DIAGNOSIS — I429 Cardiomyopathy, unspecified: Secondary | ICD-10-CM

## 2023-12-17 DIAGNOSIS — M3500 Sicca syndrome, unspecified: Secondary | ICD-10-CM

## 2023-12-17 DIAGNOSIS — M170 Bilateral primary osteoarthritis of knee: Secondary | ICD-10-CM

## 2023-12-17 DIAGNOSIS — R7989 Other specified abnormal findings of blood chemistry: Secondary | ICD-10-CM

## 2023-12-17 DIAGNOSIS — K117 Disturbances of salivary secretion: Secondary | ICD-10-CM | POA: Diagnosis not present

## 2023-12-17 LAB — ECHOCARDIOGRAM COMPLETE
Area-P 1/2: 6.71 cm2
Est EF: <20
MV M vel: 4.38 m/s
MV Peak grad: 76.7 mm[Hg]
Radius: 0.4 cm
S' Lateral: 5.2 cm

## 2023-12-18 ENCOUNTER — Encounter (HOSPITAL_BASED_OUTPATIENT_CLINIC_OR_DEPARTMENT_OTHER): Payer: Self-pay

## 2023-12-18 ENCOUNTER — Other Ambulatory Visit: Payer: Self-pay | Admitting: Internal Medicine

## 2023-12-18 ENCOUNTER — Telehealth: Payer: Self-pay | Admitting: Cardiology

## 2023-12-18 DIAGNOSIS — M3500 Sicca syndrome, unspecified: Secondary | ICD-10-CM

## 2023-12-18 MED ORDER — LOSARTAN POTASSIUM 25 MG PO TABS: ORAL_TABLET | ORAL | 1 refills | Status: DC

## 2023-12-18 NOTE — Telephone Encounter (Signed)
 Per mychart message blood pressure 116/75 pulse 99 today with Lasix Spoke with Dr Cristal Deer and will have her start Losartan 25 mg 1/2 tablet daily, monitor blood pressure and keep follow up 3/3 with Luther Parody NP   Advised patient verbalized understanding

## 2023-12-18 NOTE — Telephone Encounter (Signed)
See phone note 2/26

## 2023-12-18 NOTE — Telephone Encounter (Signed)
 Pt returning Dr Gwendlyn Deutscher call regarding Echo results. Requesting cb

## 2023-12-18 NOTE — Telephone Encounter (Signed)
-----   Message from [facility] sent at 12/17/2023  8:05 PM EST ----- I tried to call x2 but left a message each time. Her echo is very abnormal--heart is very weak, worse than prior. She sees Luther Parody soon but I'd like to try to get her on some heart protective medication ASAP. Her blood pressure was borderline at our visit. Does she have a way to check blood pressure at home? How often is she taking the lasix? What is her swelling/breathing/weight doing? If we can get some medication started now then [place] can continue to titrate. If she's got low BP or we don't have room for meds, then I'd refer her to the advanced HF clinic

## 2023-12-18 NOTE — Telephone Encounter (Signed)
 Spoke with patient regarding echo  Stated she has not felt her best lately  Feeling fatigue, dyspnea on exertion with doing very little, and feels bloated to the point she doesn't eat much  Denies and LE swelling Uses Lasix when she feels really bloated, about 3 x a week  Does not check her blood pressure at home but has a way of checking.   Will forward to Dr Cristal Deer for review

## 2023-12-23 ENCOUNTER — Encounter (HOSPITAL_BASED_OUTPATIENT_CLINIC_OR_DEPARTMENT_OTHER): Payer: Self-pay | Admitting: Family

## 2023-12-23 ENCOUNTER — Encounter (HOSPITAL_BASED_OUTPATIENT_CLINIC_OR_DEPARTMENT_OTHER): Payer: Self-pay

## 2023-12-23 ENCOUNTER — Ambulatory Visit (INDEPENDENT_AMBULATORY_CARE_PROVIDER_SITE_OTHER): Payer: 59 | Admitting: Family

## 2023-12-23 VITALS — BP 128/72 | HR 97 | Ht 70.0 in | Wt 326.3 lb

## 2023-12-23 DIAGNOSIS — E782 Mixed hyperlipidemia: Secondary | ICD-10-CM

## 2023-12-23 DIAGNOSIS — I420 Dilated cardiomyopathy: Secondary | ICD-10-CM

## 2023-12-23 DIAGNOSIS — I5022 Chronic systolic (congestive) heart failure: Secondary | ICD-10-CM | POA: Diagnosis not present

## 2023-12-23 MED ORDER — METOPROLOL SUCCINATE ER 25 MG PO TB24: 25.0000 mg | ORAL_TABLET | Freq: Every day | ORAL | 3 refills | Status: DC

## 2023-12-23 MED ORDER — METOPROLOL TARTRATE 100 MG PO TABS: 100.0000 mg | ORAL_TABLET | Freq: Once | ORAL | 0 refills | Status: DC

## 2023-12-23 NOTE — Patient Instructions (Addendum)
 Medication Instructions:   START Metoprolol succinate 25mg  daily  CONTINUE Losartan 12.5mg  (half tablet) daily  *If you need a refill on your cardiac medications before your next appointment, please call your pharmacy*   Lab Work: Your physician recommends that you return for lab work today: CMET, BNP, lipid panel, thyroid panel, Lp(a)  If you have labs (blood work) drawn today and your tests are completely normal, you will receive your results only by: MyChart Message (if you have MyChart) OR A paper copy in the mail If you have any lab test that is abnormal or we need to change your treatment, we will call you to review the results.   Testing/Procedures:   Your cardiac CT will be scheduled at one of the below locations:   [facility] [address] [place], [place] [address] [phone]  If scheduled at [facility], please arrive at the [facility] and Children's Entrance (Entrance C2) of [facility] 30 minutes prior to test start time. You can use the FREE valet parking offered at entrance C (encouraged to control the heart rate for the test)  Proceed to the [facility] Radiology Department (first floor) to check-in and test prep.  All radiology patients and guests should use entrance C2 at [facility], accessed from [facility], even though the hospital's physical address listed is [address].    Please follow these instructions carefully (unless otherwise directed):  An IV will be required for this test and Nitroglycerin will be given.   On the Night Before the Test: Be sure to Drink plenty of water. Do not consume any caffeinated/decaffeinated beverages or chocolate 12 hours prior to your test. Do not take any antihistamines 12 hours prior to your test. If the patient has contrast allergy: Patient will need a prescription for Prednisone and very clear instructions (as follows): Prednisone 50 mg - take 13  hours prior to test Take another Prednisone 50 mg 7 hours prior to test Take another Prednisone 50 mg 1 hour prior to test Take Benadryl 50 mg 1 hour prior to test Patient must complete all four doses of above prophylactic medications. Patient will need a ride after test due to Benadryl.  On the Day of the Test: Drink plenty of water until 1 hour prior to the test. Do not eat any food 1 hour prior to test. You may take your regular medications prior to the test.  Take metoprolol (Lopressor) 100mg  and your Metoprolol Succinate 25mg  two hours prior to test. If you take Furosemide/Hydrochlorothiazide/Spironolactone/Chlorthalidone, please HOLD on the morning of the test. Patients who wear a continuous glucose monitor MUST remove the device prior to scanning. FEMALES- please wear underwire-free bra if available, avoid dresses & tight clothing  After the Test: Drink plenty of water. After receiving IV contrast, you may experience a mild flushed feeling. This is normal. On occasion, you may experience a mild rash up to 24 hours after the test. This is not dangerous. If this occurs, you can take Benadryl 25 mg, Zyrtec, Claritin, or Allegra and increase your fluid intake. (Patients taking Tikosyn should avoid Benadryl, and may take Zyrtec, Claritin, or Allegra) If you experience trouble breathing, this can be serious. If it is severe call 911 IMMEDIATELY. If it is mild, please call our office.  We will call to schedule your test 2-4 weeks out understanding that some insurance companies will need an authorization prior to the service being performed.   For more  information and frequently asked questions, please visit our website : [email or web address]  For non-scheduling related questions, please contact the cardiac imaging nurse navigator should you have any questions/concerns: Cardiac Imaging Nurse Navigators Direct Office Dial: [phone]   For scheduling needs, including  cancellations and rescheduling, please call [place], [phone].  Follow-Up: At [facility], you and your health needs are our priority.  As part of our continuing mission to provide you with exceptional heart care, we have created designated Provider Care Teams.  These Care Teams include your primary Cardiologist (physician) and Advanced Practice Providers (APPs -  Physician Assistants and Nurse Practitioners) who all work together to provide you with the care you need, when you need it.  We recommend signing up for the patient portal called "MyChart".  Sign up information is provided on this After Visit Summary.  MyChart is used to connect with patients for Virtual Visits (Telemedicine).  Patients are able to view lab/test results, encounter notes, upcoming appointments, etc.  Non-urgent messages can be sent to your provider as well.   To learn more about what you can do with MyChart, go to [email or web address].    Your next appointment:   1 month(s) with Dr. Cristal Deer or Alver Sorrow, NP   Other Instructions Recommend restricting to less than 64 oz (2 liters) of fluid Recommend low sodium diet (000mg  per day)  Recommend weighing daily and keeping a log. Please call our office if you have weight gain of 2 pounds overnight or 5 pounds in 1 week.   Date  Time Weight

## 2023-12-23 NOTE — Progress Notes (Signed)
 Cardiology Office Note:  .   Date:  12/23/2023  ID:  Georganna Skeans, DOB [birth year], MRN [id number] PCP: Zola Button, Grayling Congress, DO  [facility] Providers Cardiologist:  Jodelle Red, MD    History of Present Illness: Georganna Skeans is a 48 y.o. female with history of severe preeclampsia/HELLP syndrome, Sjogren's, systolic heart failure.  Established with Dr. Cristal Deer 11/20/2023.  She noted frequent syncopal spells in her 12s with cardiology workup including EKG/orthostatics unremarkable but unclear whether she had echocardiogram.  In her 30s and 40s no recurrent syncope but did have hypotension.  At age 37 had severe preeclampsia/HELLP  syndrome in 6 weeks after delivery her blood pressure normalized.  Prior echocardiogram 2014 due to syncope LVEF 45-50%.  December 2024 increased exertional dyspnea, fluid retention.  BNP in ED 584, CXR with cardiomegaly and no acute process.  Was started on diuretics.  At initial visit 11/20/2023 she was recommended for echocardiogram, reassessment of lipids with next labs.  Echocardiogram 12/17/2023 LVEF less than 20%, LV global hypokinesis, LV moderate to severely dilated, RV SF moderately reduced, normal PASP, bilateral atria moderately dilated, moderate to severe MR. Based on results, Losartan 12.5mg  daily was initiated. She reported using Lasix 3 x per week when she felt profoundly bloated. Medication titration limited by relative hypotension.   Presents today for follow up. Reviewed echo in detail. Reports some baseline hypotension dating back many years and often feels okay as long as BP >98/60. SBP at home most recently 110s. Heart rate at home 90s - ocasional palpitations. No known family history of heart failure. Rare alcohol use. Nonsmoker, but did have passive exposure. No use of illict substances. She reports her onset of symptoms was sudden in December while on vacation when she felt profoundly dyspneic while walking  [place] the [place]. No known illness at that time but does have mild ongoing congestion. No known fever. Exertional dyspnea with activities such as walking stairs but with some improvement. Not working with personal trainer at this time as they recommended she get worked up for her dyspnea prior to proceeding.   ROS: Please see the history of present illness.    All other systems reviewed and are negative.   Studies Reviewed: .        Cardiac Studies & Procedures   ______________________________________________________________________________________________     ECHOCARDIOGRAM  ECHOCARDIOGRAM COMPLETE 12/17/2023  Narrative ECHOCARDIOGRAM REPORT    Patient Name:   UZMA HELLMER Date of Exam: 12/17/2023 Medical Rec #:  [id number]           Height:       70.0 in Accession #:    [id number]          Weight:       321.4 lb Date of Birth:  08/23/1976           BSA:          2.554 m Patient Age:    48 years            BP:           118/84 mmHg Patient Gender: F                   HR:           95 bpm. Exam Location:  Outpatient  Procedure: 2D Echo, Cardiac Doppler, Color Doppler and 3D Echo (Both Spectral and Color Flow Doppler were utilized during procedure).  Indications:    CHF  History:        Patient has prior history of Echocardiogram examinations, most recent 05/01/2013. Risk Factors:Non-Smoker and Dyslipidemia. Severe pre-eclampsia/HELLP syndrome, Sjogren's syndrome.  Sonographer:    Jeryl Columbia RDCS Referring Phys: [id number] BRIDGETTE CHRISTOPHER  IMPRESSIONS   1. Left ventricular ejection fraction, by estimation, is <20%. The left ventricle has severely decreased function. The left ventricle demonstrates global hypokinesis. The left ventricular internal cavity size was moderately to severely dilated. Left ventricular diastolic parameters are indeterminate. 2. Right ventricular systolic function is moderately reduced. The right ventricular size is moderately  enlarged. There is normal pulmonary artery systolic pressure. 3. Left atrial size was moderately dilated. 4. Right atrial size was moderately dilated. 5. The mitral valve is normal in structure. Moderate to severe mitral valve regurgitation. No evidence of mitral stenosis. 6. The aortic valve is tricuspid. Aortic valve regurgitation is trivial. No aortic stenosis is present. 7. The inferior vena cava is dilated in size with <50% respiratory variability, suggesting right atrial pressure of 15 mmHg.  Comparison(s): Changes from prior study are noted.  Conclusion(s)/Recommendation(s): EF severely reduced with global hypokinesis.  FINDINGS Left Ventricle: Left ventricular ejection fraction, by estimation, is <20%. The left ventricle has severely decreased function. The left ventricle demonstrates global hypokinesis. 3D ejection fraction reviewed and evaluated as part of the interpretation. Alternate measurement of EF is felt to be most reflective of LV function. The left ventricular internal cavity size was moderately to severely dilated. There is no left ventricular hypertrophy. Left ventricular diastolic parameters are indeterminate.  Right Ventricle: The right ventricular size is moderately enlarged. Right vetricular wall thickness was not well visualized. Right ventricular systolic function is moderately reduced. There is normal pulmonary artery systolic pressure. The tricuspid regurgitant velocity is 2.28 m/s, and with an assumed right atrial pressure of 8 mmHg, the estimated right ventricular systolic pressure is 28.8 mmHg.  Left Atrium: Left atrial size was moderately dilated.  Right Atrium: Right atrial size was moderately dilated.  Pericardium: Trivial pericardial effusion is present.  Mitral Valve: The mitral valve is normal in structure. Moderate to severe mitral valve regurgitation. No evidence of mitral valve stenosis.  Tricuspid Valve: The tricuspid valve is normal in structure.  Tricuspid valve regurgitation is mild . No evidence of tricuspid stenosis.  Aortic Valve: The aortic valve is tricuspid. Aortic valve regurgitation is trivial. No aortic stenosis is present.  Pulmonic Valve: The pulmonic valve was grossly normal. Pulmonic valve regurgitation is mild. No evidence of pulmonic stenosis.  Aorta: The aortic root, ascending aorta, aortic arch and descending aorta are all structurally normal, with no evidence of dilitation or obstruction.  Venous: The inferior vena cava is dilated in size with less than 50% respiratory variability, suggesting right atrial pressure of 15 mmHg.  IAS/Shunts: The atrial septum is grossly normal.  Additional Comments: 3D was performed not requiring image post processing on an independent workstation and was abnormal.   LEFT VENTRICLE PLAX 2D LVIDd:         6.00 cm   Diastology LVIDs:         5.20 cm   LV e' medial:    8.49 cm/s LV PW:         1.17 cm   LV E/e' medial:  11.7 LV IVS:        0.82 cm   LV e' lateral:   14.00 cm/s LVOT diam:     1.80 cm   LV E/e' lateral: 7.1 LVOT Area:     2.54  cm  3D Volume EF: 3D EF:        34 % LV EDV:       248 ml LV ESV:       163 ml LV SV:        85 ml  RIGHT VENTRICLE RV Basal diam:  4.94 cm RV Mid diam:    3.74 cm RV S prime:     7.46 cm/s TAPSE (M-mode): 1.4 cm  LEFT ATRIUM              Index        RIGHT ATRIUM           Index LA diam:        4.60 cm  1.80 cm/m   RA Area:     27.60 cm LA Vol (A2C):   106.0 ml 41.51 ml/m  RA Volume:   94.30 ml  36.93 ml/m LA Vol (A4C):   108.0 ml 42.29 ml/m LA Biplane Vol: 117.0 ml 45.82 ml/m  AORTA Ao Root diam: 2.80 cm Ao Asc diam:  3.10 cm  MITRAL VALVE                  TRICUSPID VALVE MV Area (PHT): 6.71 cm       TR Peak grad:   20.8 mmHg MV Decel Time: 113 msec       TR Vmax:        228.00 cm/s MR Peak grad:    76.7 mmHg MR Mean grad:    54.0 mmHg    SHUNTS MR Vmax:         438.00 cm/s  Systemic Diam: 1.80 cm MR Vmean:         351.0 cm/s MR PISA:         1.01 cm MR PISA Eff ROA: 7 mm MR PISA Radius:  0.40 cm MV E velocity: 99.20 cm/s MV A velocity: 47.10 cm/s MV E/A ratio:  2.11  Jodelle Red MD Electronically signed by Jodelle Red MD Signature Date/Time: 12/17/2023/6:18:50 PM    Final          ______________________________________________________________________________________________      Risk Assessment/Calculations:             Physical Exam:   VS:  BP 128/72   Pulse 97   Ht 5\' 10"  (1.778 m)   Wt (!) 326 lb 4.8 oz (148 kg)   LMP 11/12/2023   SpO2 95%   BMI 46.82 kg/m    Wt Readings from Last 3 Encounters:  12/23/23 (!) 326 lb 4.8 oz (148 kg)  12/17/23 (!) 323 lb (146.5 kg)  11/20/23 (!) 321 lb 6.4 oz (145.8 kg)    GEN: Well nourished, overweight, well developed in no acute distress NECK: No JVD; No carotid bruits CARDIAC: RRR, no murmurs, rubs, gallops RESPIRATORY:  Clear to auscultation without rales, wheezing or rhonchi  ABDOMEN: Soft, non-tender, non-distended EXTREMITIES:  No edema; No deformity   ASSESSMENT AND PLAN: .    HFrEF / Dilated cardiomyopathy - Echo 11/2023 LVEF <20%, moderate to severe MR, RVSF moderately reduced. Etiology of heart failure unclear.NYHA II symptoms. Plan for cardiac CTA to rule out ischemia as etiology of heart failure.  Labs today: Thyroid panel to rule out any abnormality contributory to heart failure Medication management Continue Losartan 12.5mg  daily. Start Metoprolol succinate 25mg  daily.  Update BNP today to reassess volume statust o determine Lasix dosing. Anticipate will need daily dosing. Anticipate medication titration will be limited by hypotension.  Next agent SGLT2i vs Spironolactone if BP will allow. Low sodium diet <2g Na, fluid restriction <2L, and daily weights encouraged. Educated to contact our office for weight gain of 2 lbs overnight or 5 lbs in one week.  If  cardiac CTA and thyroid panel with no clear  cause of heart failure, plan to refer to [facility].   Hypotension - Baseline hypotension per her rpeort is longstanding and often not symtpomatic until BP <98/60. Discussed to monitor BP at home at least 2 hours after medications and sitting for 5-10 minutes. Monitor carefully with titration of GDMT for HF.   CV risk factor (Hx of preeclampsia/ HELLP) / HLD - Plan for lipid panel, CMET, Lp(a) today.   Sjogren's - Her Plaquenil for Sjogren's is on hold for 6 months per Dr. Dimple Casey and monitor symptoms of dry eyes, arthralgia.        Dispo: follow up in 1 month  Signed, Alver Sorrow, NP

## 2023-12-24 NOTE — Telephone Encounter (Signed)
 Okay to reduce to 12.5mg  (half tablet). Would recommend taking in the evening once per day. Skip dose tonight, and take tomorrow evening.   Would recommend she just take a half tablet (50mg ) of metoprolol tartrate two hours prior to cardiac CTA.   Alver Sorrow

## 2023-12-25 LAB — LIPID PANEL
Chol/HDL Ratio: 3.7 ratio (ref 0.0–4.4)
Cholesterol, Total: 138 mg/dL (ref 100–199)
HDL: 37 mg/dL — ABNORMAL LOW (ref >39)
LDL Chol Calc (NIH): 84 mg/dL (ref 0–99)
Triglycerides: 89 mg/dL (ref 0–149)
VLDL Cholesterol Cal: 17 mg/dL (ref 5–40)

## 2023-12-25 LAB — LIPOPROTEIN A (LPA): Lipoprotein (a): 112.5 nmol/L — ABNORMAL HIGH (ref <75.0)

## 2023-12-25 LAB — COMPREHENSIVE METABOLIC PANEL
ALT: 18 IU/L (ref 0–32)
AST: 20 IU/L (ref 0–40)
Albumin: 3.8 g/dL — ABNORMAL LOW (ref 3.9–4.9)
Alkaline Phosphatase: 104 IU/L (ref 44–121)
BUN/Creatinine Ratio: 11 (ref 9–23)
BUN: 10 mg/dL (ref 6–24)
Bilirubin Total: 1 mg/dL (ref 0.0–1.2)
CO2: 25 mmol/L (ref 20–29)
Calcium: 8.9 mg/dL (ref 8.7–10.2)
Chloride: 104 mmol/L (ref 96–106)
Creatinine, Ser: 0.91 mg/dL (ref 0.57–1.00)
Globulin, Total: 3.4 g/dL (ref 1.5–4.5)
Glucose: 88 mg/dL (ref 70–99)
Potassium: 4 mmol/L (ref 3.5–5.2)
Sodium: 141 mmol/L (ref 134–144)
Total Protein: 7.2 g/dL (ref 6.0–8.5)
eGFR: 77 mL/min/{1.73_m2} (ref >59)

## 2023-12-25 LAB — THYROID PANEL WITH TSH
Free Thyroxine Index: 1.6 (ref 1.2–4.9)
T3 Uptake Ratio: 29 % (ref 24–39)
T4, Total: 5.5 ug/dL (ref 4.5–12.0)
TSH: 2.06 u[IU]/mL (ref 0.450–4.500)

## 2023-12-25 LAB — BRAIN NATRIURETIC PEPTIDE: BNP: 540.6 pg/mL — ABNORMAL HIGH (ref 0.0–100.0)

## 2023-12-26 ENCOUNTER — Encounter (HOSPITAL_BASED_OUTPATIENT_CLINIC_OR_DEPARTMENT_OTHER): Payer: Self-pay

## 2023-12-26 ENCOUNTER — Other Ambulatory Visit (HOSPITAL_BASED_OUTPATIENT_CLINIC_OR_DEPARTMENT_OTHER): Payer: Self-pay

## 2023-12-26 ENCOUNTER — Telehealth (HOSPITAL_BASED_OUTPATIENT_CLINIC_OR_DEPARTMENT_OTHER): Payer: Self-pay

## 2023-12-26 DIAGNOSIS — E782 Mixed hyperlipidemia: Secondary | ICD-10-CM

## 2023-12-26 MED ORDER — ROSUVASTATIN CALCIUM 10 MG PO TABS
5.0000 mg | ORAL_TABLET | Freq: Every day | ORAL | 1 refills | Status: DC
Start: 2023-12-26 — End: 2023-12-30
  Filled 2023-12-26: qty 15, 30d supply, fill #0

## 2023-12-26 NOTE — Telephone Encounter (Addendum)
 Left message for patient to call back   ----- Message from Alver Sorrow sent at 12/26/2023  8:26 AM EST ----- Normal kidneys, liver, electrolytes.  BNP with volume overload slightly improved from prior.  LP(a) elevated indicating familial hyperlipidemia and increased cardiovascular risk.  Cholesterol numbers are reasonable.  Recommend initiation of rosuvastatin 10 mg daily to help lower cardiovascular risk due to elevated LP(a). Recommend taking Furosemide 40mg  daily for 3 days then reduce to 20mg  daily. Repeat BMET/BNP in 1 week to ensure kidney function and potassium stable.

## 2023-12-30 ENCOUNTER — Other Ambulatory Visit (HOSPITAL_BASED_OUTPATIENT_CLINIC_OR_DEPARTMENT_OTHER): Payer: Self-pay

## 2023-12-30 MED ORDER — ROSUVASTATIN CALCIUM 10 MG PO TABS: 5.0000 mg | ORAL_TABLET | Freq: Every day | ORAL | 1 refills | Status: DC

## 2023-12-30 NOTE — Addendum Note (Signed)
 Addended by: Gabriel Rung on: 12/30/2023 09:04 AM   Modules accepted: Orders

## 2024-01-09 ENCOUNTER — Other Ambulatory Visit: Payer: Self-pay

## 2024-01-09 MED ORDER — LOSARTAN POTASSIUM 25 MG PO TABS
12.5000 mg | ORAL_TABLET | Freq: Every day | ORAL | 3 refills | Status: DC
Start: 2024-01-09 — End: 2024-03-20

## 2024-01-10 ENCOUNTER — Encounter (HOSPITAL_COMMUNITY): Payer: Self-pay

## 2024-01-13 ENCOUNTER — Telehealth (HOSPITAL_COMMUNITY): Payer: Self-pay | Admitting: *Deleted

## 2024-01-13 NOTE — Telephone Encounter (Signed)
 Attempted to call patient regarding upcoming cardiac CT appointment. Left message on voicemail with name and callback number Johney Frame RN Navigator Cardiac Imaging [facility] [phone] Office

## 2024-01-14 ENCOUNTER — Other Ambulatory Visit (HOSPITAL_COMMUNITY): Payer: Self-pay

## 2024-01-14 ENCOUNTER — Telehealth (HOSPITAL_BASED_OUTPATIENT_CLINIC_OR_DEPARTMENT_OTHER): Payer: Self-pay

## 2024-01-14 ENCOUNTER — Encounter (HOSPITAL_COMMUNITY): Payer: Self-pay

## 2024-01-14 ENCOUNTER — Ambulatory Visit (HOSPITAL_COMMUNITY)
Admission: RE | Admit: 2024-01-14 | Discharge: 2024-01-14 | Disposition: A | Discharge status: Home / Self Care | Source: Ambulatory Visit | Attending: Family | Admitting: Family

## 2024-01-14 DIAGNOSIS — E782 Mixed hyperlipidemia: Secondary | ICD-10-CM

## 2024-01-14 DIAGNOSIS — I420 Dilated cardiomyopathy: Secondary | ICD-10-CM

## 2024-01-14 DIAGNOSIS — I5022 Chronic systolic (congestive) heart failure: Secondary | ICD-10-CM

## 2024-01-14 MED ORDER — IVABRADINE HCL 5 MG PO TABS
ORAL_TABLET | ORAL | 0 refills | Status: DC
  Filled 2024-01-14: qty 3, 1d supply, fill #0

## 2024-01-14 NOTE — Telephone Encounter (Signed)
 Received the following secure chat from Gillian Shields, NP   "so they had to cancel her CT today as HR didn't get low enough and BP was low. they're going to reach out to reschedule. however, to get best picture and not lower BP too far would recommend: hold lasix/losartan morning of rescheduled test. take ivabradine 15mg  prior to CT (can take her usual metoprolol succinate but do not take metoprolol tartrate as it lowered her BP too far) "   Will send message to pt with instructions and updated rx sent to [facility] pharmacy for mailing.

## 2024-01-14 NOTE — Progress Notes (Signed)
 Pt brought in by husband in wheelchair, stated normally able to ambulate but was feeling unwell after taking metoprolol and that her blood pressure felt low.   Once on the monitor, HR remained in the 80s and SBP @ 93. Pt stated BP normally sits low and SBP was 107 prior to taking pre-med of metoprolol. MD Cristal Deer aware, pt will be rescheduled with different pre-med. Pt and husband aware, all questions answered and understanding verbalized. Cardiac navigators notified by this RN. Pt went home by wheelchair with husband.

## 2024-01-15 ENCOUNTER — Other Ambulatory Visit: Payer: Self-pay

## 2024-01-15 ENCOUNTER — Other Ambulatory Visit (HOSPITAL_COMMUNITY): Payer: Self-pay

## 2024-01-16 ENCOUNTER — Other Ambulatory Visit: Payer: Self-pay

## 2024-01-17 NOTE — Telephone Encounter (Signed)
 Hey ladies, any update on when her CT might be rescheduled?

## 2024-01-21 ENCOUNTER — Ambulatory Visit (HOSPITAL_BASED_OUTPATIENT_CLINIC_OR_DEPARTMENT_OTHER): Admitting: Family

## 2024-01-24 ENCOUNTER — Encounter (HOSPITAL_COMMUNITY): Payer: Self-pay

## 2024-01-28 ENCOUNTER — Ambulatory Visit (HOSPITAL_COMMUNITY)
Admission: RE | Admit: 2024-01-28 | Discharge: 2024-01-28 | Disposition: A | Discharge status: Home / Self Care | Source: Ambulatory Visit | Attending: Family | Admitting: Family

## 2024-01-28 ENCOUNTER — Encounter (HOSPITAL_COMMUNITY): Payer: Self-pay

## 2024-01-28 NOTE — Progress Notes (Signed)
 Patient arrived for the second attempt to have a CT heart. Patient took Ivabradine prior this attempt however her blood pressure is 91/78 with the heart rate of 93. Called and spoke to Dr. Jacques Navy who stated this isn't the test for this patient and to cancel.  Notified CT and the CT heart navigators. Patient understanding and wanted to do what would be safest.

## 2024-01-31 ENCOUNTER — Telehealth: Payer: Self-pay | Admitting: Internal Medicine

## 2024-01-31 ENCOUNTER — Ambulatory Visit (HOSPITAL_BASED_OUTPATIENT_CLINIC_OR_DEPARTMENT_OTHER): Admitting: Family

## 2024-01-31 ENCOUNTER — Encounter (HOSPITAL_BASED_OUTPATIENT_CLINIC_OR_DEPARTMENT_OTHER): Payer: Self-pay | Admitting: Family

## 2024-01-31 VITALS — BP 122/78 | HR 93 | Ht 70.0 in | Wt 324.3 lb

## 2024-01-31 DIAGNOSIS — I952 Hypotension due to drugs: Secondary | ICD-10-CM

## 2024-01-31 DIAGNOSIS — E7849 Other hyperlipidemia: Secondary | ICD-10-CM | POA: Diagnosis not present

## 2024-01-31 DIAGNOSIS — I420 Dilated cardiomyopathy: Secondary | ICD-10-CM | POA: Diagnosis not present

## 2024-01-31 DIAGNOSIS — R0600 Dyspnea, unspecified: Secondary | ICD-10-CM

## 2024-01-31 DIAGNOSIS — I5022 Chronic systolic (congestive) heart failure: Secondary | ICD-10-CM

## 2024-01-31 DIAGNOSIS — D649 Anemia, unspecified: Secondary | ICD-10-CM

## 2024-01-31 MED ORDER — FUROSEMIDE 20 MG PO TABS
20.0000 mg | ORAL_TABLET | Freq: Every day | ORAL | 2 refills | Status: DC | PRN
Start: 2024-01-31 — End: 2024-03-20

## 2024-01-31 MED ORDER — DAPAGLIFLOZIN PROPANEDIOL 10 MG PO TABS: 10.0000 mg | ORAL_TABLET | Freq: Every day | ORAL | 2 refills | Status: DC

## 2024-01-31 MED ORDER — DAPAGLIFLOZIN PROPANEDIOL 10 MG PO TABS: 10.0000 mg | ORAL_TABLET | Freq: Every day | ORAL | 0 refills | Status: DC

## 2024-01-31 NOTE — Telephone Encounter (Signed)
 Called to sched referral

## 2024-01-31 NOTE — Progress Notes (Signed)
 Cardiology Office Note:  .   Date:  01/31/2024  ID:  Stacey Huerta, DOB [birth year], MRN [id number] PCP: Zola Button, Grayling Congress, DO   [facility] Providers Cardiologist:  Jodelle Red, MD    History of Present Illness: Stacey Huerta is a 48 y.o. female with history of severe preeclampsia/HELLP syndrome, Sjogren's, systolic heart failure.  Established with Dr. Cristal Deer 11/20/2023.  She noted frequent syncopal spells in her 81s with cardiology workup including EKG/orthostatics unremarkable but unclear whether she had echocardiogram.  In her 30s and 40s no recurrent syncope but did have hypotension.  At age 59 had severe preeclampsia/HELLP  syndrome in 6 weeks after delivery her blood pressure normalized.  Prior echocardiogram 2014 due to syncope LVEF 45-50%.  December 2024 increased exertional dyspnea, fluid retention.  BNP in ED 584, CXR with cardiomegaly and no acute process.  Was started on diuretics.  At initial visit 11/20/2023 she was recommended for echocardiogram, reassessment of lipids with next labs.  Echocardiogram 12/17/2023 LVEF less than 20%, LV global hypokinesis, LV moderate to severely dilated, RV SF moderately reduced, normal PASP, bilateral atria moderately dilated, moderate to severe MR. Based on results, Losartan 12.5mg  daily was initiated. She reported using Lasix 3 x per week when she felt profoundly bloated. Medication titration limited by relative hypotension.   At visit 12/23/23 Losartan 12.5mg  daily initiated. BNP that day 540.6 with recommendation for Lasix 40mg  daily x 2 days then 20mg  daily. Based on Lp(a) 112.5 Rosuvastatin 5mg  daily initiated. Cardiac CTA ordered for ischemic evaluation, however unable to be completed as BP too low with Metoprolol dosing prior and even with utilization of Ivabradine BP would not tolerate nitroglycerin for study images.   History of Present Illness Presents with concerns about shortness of breath and  fatigue. These symptoms have been affecting her daily activities and exercise routine. The patient reports feeling lightheaded at times, which she attributes to her low blood pressure. She has been taking Losartan, Metoprolol, and Lasix as prescribed. The patient also mentions a previous issue with low platelet and white blood cell counts, which she believes may be contributing to her current symptoms. Requests recheck today. Understandably concerned about her ability to enjoy upcoming trips [facility] for work next week and girls trip to [place], [place] the following week) due to her health issues.BP at home upon waking 110/73, 123/85, 116/88, 111/84. No snoring nor daytime somnolence.   ROS: Please see the history of present illness.    All other systems reviewed and are negative.   Studies Reviewed: .           Risk Assessment/Calculations:             Physical Exam:   VS:  BP 122/78 (BP Location: Right Arm, Patient Position: Sitting, Cuff Size: Large)   Pulse 93   Ht 5\' 10"  (1.778 m)   Wt (!) 324 lb 4.8 oz (147.1 kg)   SpO2 98%   BMI 46.53 kg/m    Wt Readings from Last 3 Encounters:  01/31/24 (!) 324 lb 4.8 oz (147.1 kg)  12/23/23 (!) 326 lb 4.8 oz (148 kg)  12/17/23 (!) 323 lb (146.5 kg)    GEN: Well nourished, overweight, well developed in no acute distress NECK: No JVD; No carotid bruits CARDIAC: RRR, no murmurs, rubs, gallops RESPIRATORY:  Clear to auscultation without rales, wheezing or rhonchi  ABDOMEN: Soft, non-tender, non-distended EXTREMITIES:  No edema; No deformity   ASSESSMENT AND PLAN: .  HFrEF / Dilated cardiomyopathy / DOE - Echo 11/2023 LVEF <20%, moderate to severe MR, RVSF moderately reduced. Etiology of heart failure unclear.NYHA II symptoms.  Labs today: BMP, BNP Medication management Continue Losartan 12.5mg  daily. Continue Metoprolol succinate 25mg  daily.  Start Farxiga 10mg  daily. Samples provided and copay card.  Change Lasix to 20mg  PRN for  weight gain of 2 lbs overnight, 5 lbs in one week Anticipate medication titration will be limited by hypotension. Next agent SGLT2i vs Spironolactone if BP will allow. Low sodium diet <2g Na, fluid restriction <2L, and daily weights encouraged. Educated to contact our office for weight gain of 2 lbs overnight or 5 lbs in one week.  Heart failure etiology workup: TSH 12/23/23 2.06 (normal).  No snoring nor daytime somnolence, defer sleep study.  Cardiac CTA unable to be performed due to hypotension despite utilization of Ivabradine in place of Metoprolol. Plan for cardiac PET for ischemic evaluation to rule out ICM.  Refer to [facility] as medication titration limited by hypotension to consider additional treatment modalities.   Hypotension - Baseline hypotension is longstanding and often not symtpomatic until BP <98/60. Discussed to continue monitor BP at home at least 2 hours after medications and sitting for 5-10 minutes. Monitor carefully with titration of GDMT for HF.   Anemia - Update CBC.   CV risk factor (Hx of preeclampsia/ HELLP) / Familial HLD - 12/23/23 Lp(a) 112.5, LDL 84. Continue Rosuvastatin 5mg  daily for cardiovascular risk reduction. Consider repeat lipid panel in 3 months. If coronary calcification noted on PET, consider increasing statin therapy to moderate dose Rosuvastatin.   Sjogren's - Her Plaquenil for Sjogren's is on hold for 6 months per Dr. Dimple Casey and monitor symptoms of dry eyes, arthralgia.        Dispo: follow up in 6-8 weeks  Signed, Alver Sorrow, NP

## 2024-01-31 NOTE — Patient Instructions (Signed)
 Medication Instructions:  Your physician has recommended you make the following change in your medication:  START Lasix 20 mg as needed for weight gain of 2 lbs overnight or 5 lbs in a week START Farxiga 10 mg daily START Losartan at bedtime  *If you need a refill on your cardiac medications before your next appointment, please call your pharmacy*  Lab Work: Your physician recommends that you return for lab work today: BMP, BNP, CBC  If you have labs (blood work) drawn today and your tests are completely normal, you will receive your results only by: MyChart Message (if you have MyChart) OR A paper copy in the mail If you have any lab test that is abnormal or we need to change your treatment, we will call you to review the results.  Testing/Procedures: Your physician recommends you get a cardiac PET.     Please report to Radiology at the [facility] Main Entrance 30 minutes early for your test.  [address] [place], [place] [address]                         OR   Please report to Radiology at [facility] Main Entrance, medical mall, 30 mins prior to your test.  [address]  [place], [place]  How to Prepare for Your Cardiac PET/CT Stress Test:  Nothing to eat or drink, except water, 3 hours prior to arrival time.  NO caffeine/decaffeinated products, or chocolate 12 hours prior to arrival. (Please note decaffeinated beverages (teas/coffees) still contain caffeine).  If you have caffeine within 12 hours prior, the test will need to be rescheduled.  Medication instructions: Do not take erectile dysfunction medications for 72 hours prior to test (sildenafil, tadalafil) Do not take nitrates (isosorbide mononitrate, Ranexa) the day before or day of test Do not take tamsulosin the day before or morning of test Hold theophylline containing medications for 12 hours. Hold Dipyridamole 48 hours prior to the test.  Diabetic Preparation: If  able to eat breakfast prior to 3 hour fasting, you may take all medications, including your insulin. Do not worry if you miss your breakfast dose of insulin - start at your next meal. If you do not eat prior to 3 hour fast-Hold all diabetes (oral and insulin) medications. Patients who wear a continuous glucose monitor MUST remove the device prior to scanning.  You may take your remaining medications with water.  NO perfume, cologne or lotion on chest or abdomen area. FEMALES - Please avoid wearing dresses to this appointment.  Total time is 1 to 2 hours; you may want to bring reading material for the waiting time.  IF YOU THINK YOU MAY BE PREGNANT, OR ARE NURSING PLEASE INFORM THE TECHNOLOGIST.  In preparation for your appointment, medication and supplies will be purchased.  Appointment availability is limited, so if you need to cancel or reschedule, please call the Radiology Department Scheduler at [phone] 24 hours in advance to avoid a cancellation fee of $100.00  What to Expect When you Arrive:  Once you arrive and check in for your appointment, you will be taken to a preparation room within the Radiology Department.  A technologist or Nurse will obtain your medical history, verify that you are correctly prepped for the exam, and explain the procedure.  Afterwards, an IV will be started in your arm and electrodes will be placed on your skin for EKG monitoring during the stress portion  of the exam. Then you will be escorted to the PET/CT scanner.  There, staff will get you positioned on the scanner and obtain a blood pressure and EKG.  During the exam, you will continue to be connected to the EKG and blood pressure machines.  A small, safe amount of a radioactive tracer will be injected in your IV to obtain a series of pictures of your heart along with an injection of a stress agent.    After your Exam:  It is recommended that you eat a meal and drink a caffeinated beverage to counter  act any effects of the stress agent.  Drink plenty of fluids for the remainder of the day and urinate frequently for the first couple of hours after the exam.  Your doctor will inform you of your test results within 7-10 business days.  For more information and frequently asked questions, please visit our website: [email or web address]  For questions about your test or how to prepare for your test, please call: Cardiac Imaging Nurse Navigators Office: [phone]   Follow-Up: At [facility], you and your health needs are our priority.  As part of our continuing mission to provide you with exceptional heart care, our providers are all part of one team.  This team includes your primary Cardiologist (physician) and Advanced Practice Providers or APPs (Physician Assistants and Nurse Practitioners) who all work together to provide you with the care you need, when you need it.  Your next appointment:   6 to 8 week(s)  Provider:   Jodelle Red, MD or Gillian Shields, NP    We recommend signing up for the patient portal called "MyChart".  Sign up information is provided on this After Visit Summary.  MyChart is used to connect with patients for Virtual Visits (Telemedicine).  Patients are able to view lab/test results, encounter notes, upcoming appointments, etc.  Non-urgent messages can be sent to your provider as well.   To learn more about what you can do with MyChart, go to [email or web address].

## 2024-02-01 LAB — BASIC METABOLIC PANEL WITH GFR
BUN/Creatinine Ratio: 11 (ref 9–23)
BUN: 12 mg/dL (ref 6–24)
CO2: 24 mmol/L (ref 20–29)
Calcium: 9.6 mg/dL (ref 8.7–10.2)
Chloride: 103 mmol/L (ref 96–106)
Creatinine, Ser: 1.06 mg/dL — ABNORMAL HIGH (ref 0.57–1.00)
Glucose: 87 mg/dL (ref 70–99)
Potassium: 4.5 mmol/L (ref 3.5–5.2)
Sodium: 140 mmol/L (ref 134–144)
eGFR: 64 mL/min/{1.73_m2} (ref >59)

## 2024-02-01 LAB — CBC
Hematocrit: 40.4 % (ref 34.0–46.6)
Hemoglobin: 13.1 g/dL (ref 11.1–15.9)
MCH: 31.7 pg (ref 26.6–33.0)
MCHC: 32.4 g/dL (ref 31.5–35.7)
MCV: 98 fL — ABNORMAL HIGH (ref 79–97)
Platelets: 166 10*3/uL (ref 150–450)
RBC: 4.13 x10E6/uL (ref 3.77–5.28)
RDW: 12.1 % (ref 11.7–15.4)
WBC: 3.4 10*3/uL (ref 3.4–10.8)

## 2024-02-01 LAB — BRAIN NATRIURETIC PEPTIDE: BNP: 565.9 pg/mL — ABNORMAL HIGH (ref 0.0–100.0)

## 2024-02-03 ENCOUNTER — Ambulatory Visit (HOSPITAL_BASED_OUTPATIENT_CLINIC_OR_DEPARTMENT_OTHER): Admitting: Family

## 2024-02-03 ENCOUNTER — Encounter (HOSPITAL_BASED_OUTPATIENT_CLINIC_OR_DEPARTMENT_OTHER): Payer: Self-pay

## 2024-02-03 DIAGNOSIS — I5022 Chronic systolic (congestive) heart failure: Secondary | ICD-10-CM

## 2024-03-17 ENCOUNTER — Ambulatory Visit (INDEPENDENT_AMBULATORY_CARE_PROVIDER_SITE_OTHER): Admitting: Family Medicine

## 2024-03-17 ENCOUNTER — Encounter: Payer: Self-pay | Admitting: Family Medicine

## 2024-03-17 VITALS — BP 124/88 | HR 93 | Temp 98.6°F | Resp 18 | Ht 70.0 in | Wt 316.8 lb

## 2024-03-17 DIAGNOSIS — Z0000 Encounter for general adult medical examination without abnormal findings: Secondary | ICD-10-CM | POA: Diagnosis not present

## 2024-03-17 DIAGNOSIS — E88819 Insulin resistance, unspecified: Secondary | ICD-10-CM | POA: Diagnosis not present

## 2024-03-17 DIAGNOSIS — M3500 Sicca syndrome, unspecified: Secondary | ICD-10-CM

## 2024-03-17 DIAGNOSIS — I509 Heart failure, unspecified: Secondary | ICD-10-CM

## 2024-03-17 DIAGNOSIS — E559 Vitamin D deficiency, unspecified: Secondary | ICD-10-CM | POA: Diagnosis not present

## 2024-03-17 DIAGNOSIS — I429 Cardiomyopathy, unspecified: Secondary | ICD-10-CM

## 2024-03-17 DIAGNOSIS — R739 Hyperglycemia, unspecified: Secondary | ICD-10-CM | POA: Diagnosis not present

## 2024-03-17 DIAGNOSIS — E538 Deficiency of other specified B group vitamins: Secondary | ICD-10-CM | POA: Diagnosis not present

## 2024-03-17 MED ORDER — SEMAGLUTIDE-WEIGHT MANAGEMENT 0.5 MG/0.5ML ~~LOC~~ SOAJ
0.5000 mg | SUBCUTANEOUS | 0 refills | Status: AC
Start: 2024-04-15 — End: 2024-05-13

## 2024-03-17 MED ORDER — SEMAGLUTIDE-WEIGHT MANAGEMENT 2.4 MG/0.75ML ~~LOC~~ SOAJ
2.4000 mg | SUBCUTANEOUS | 3 refills | Status: DC
Start: 2024-07-11 — End: 2024-05-19

## 2024-03-17 MED ORDER — SEMAGLUTIDE-WEIGHT MANAGEMENT 0.25 MG/0.5ML ~~LOC~~ SOAJ: 0.2500 mg | SUBCUTANEOUS | 0 refills | Status: AC

## 2024-03-17 MED ORDER — SEMAGLUTIDE-WEIGHT MANAGEMENT 1 MG/0.5ML ~~LOC~~ SOAJ: 1.0000 mg | SUBCUTANEOUS | 0 refills | Status: DC

## 2024-03-17 MED ORDER — SEMAGLUTIDE-WEIGHT MANAGEMENT 1.7 MG/0.75ML ~~LOC~~ SOAJ
1.7000 mg | SUBCUTANEOUS | 0 refills | Status: DC
Start: 2024-06-12 — End: 2024-05-19

## 2024-03-17 NOTE — Patient Instructions (Signed)
 Preventive Care 16-50 Years Old, Female  Preventive care refers to lifestyle choices and visits with your health care provider that can promote health and wellness. Preventive care visits are also called wellness exams.  What can I expect for my preventive care visit?  Counseling  Your health care provider may ask you questions about your:  Medical history, including:  Past medical problems.  Family medical history.  Pregnancy history.  Current health, including:  Menstrual cycle.  Method of birth control.  Emotional well-being.  Home life and relationship well-being.  Sexual activity and sexual health.  Lifestyle, including:  Alcohol, nicotine or tobacco, and drug use.  Access to firearms.  Diet, exercise, and sleep habits.  Work and work Astronomer.  Sunscreen use.  Safety issues such as seatbelt and bike helmet use.  Physical exam  Your health care provider will check your:  Height and weight. These may be used to calculate your BMI (body mass index). BMI is a measurement that tells if you are at a healthy weight.  Waist circumference. This measures the distance around your waistline. This measurement also tells if you are at a healthy weight and may help predict your risk of certain diseases, such as type 2 diabetes and high blood pressure.  Heart rate and blood pressure.  Body temperature.  Skin for abnormal spots.  What immunizations do I need?    Vaccines are usually given at various ages, according to a schedule. Your health care provider will recommend vaccines for you based on your age, medical history, and lifestyle or other factors, such as travel or where you work.  What tests do I need?  Screening  Your health care provider may recommend screening tests for certain conditions. This may include:  Lipid and cholesterol levels.  Diabetes screening. This is done by checking your blood sugar (glucose) after you have not eaten for a while (fasting).  Pelvic exam and Pap test.  Hepatitis B test.  Hepatitis C  test.  HIV (human immunodeficiency virus) test.  STI (sexually transmitted infection) testing, if you are at risk.  Lung cancer screening.  Colorectal cancer screening.  Mammogram. Talk with your health care provider about when you should start having regular mammograms. This may depend on whether you have a family history of breast cancer.  BRCA-related cancer screening. This may be done if you have a family history of breast, ovarian, tubal, or peritoneal cancers.  Bone density scan. This is done to screen for osteoporosis.  Talk with your health care provider about your test results, treatment options, and if necessary, the need for more tests.  Follow these instructions at home:  Eating and drinking    Eat a diet that includes fresh fruits and vegetables, whole grains, lean protein, and low-fat dairy products.  Take vitamin and mineral supplements as recommended by your health care provider.  Do not drink alcohol if:  Your health care provider tells you not to drink.  You are pregnant, may be pregnant, or are planning to become pregnant.  If you drink alcohol:  Limit how much you have to 0-1 drink a day.  Know how much alcohol is in your drink. In the [place] one drink equals one 12 oz bottle of beer (355 mL), one 5 oz glass of wine (148 mL), or one 1 oz glass of hard liquor (44 mL).  Lifestyle  Brush your teeth every morning and night with fluoride toothpaste. Floss one time each day.  Exercise for at least  30 minutes 5 or more days each week.  Do not use any products that contain nicotine or tobacco. These products include cigarettes, chewing tobacco, and vaping devices, such as e-cigarettes. If you need help quitting, ask your health care provider.  Do not use drugs.  If you are sexually active, practice safe sex. Use a condom or other form of protection to prevent STIs.  If you do not wish to become pregnant, use a form of birth control. If you plan to become pregnant, see your health care provider for a  prepregnancy visit.  Take aspirin only as told by your health care provider. Make sure that you understand how much to take and what form to take. Work with your health care provider to find out whether it is safe and beneficial for you to take aspirin daily.  Find healthy ways to manage stress, such as:  Meditation, yoga, or listening to music.  Journaling.  Talking to a trusted person.  Spending time with friends and family.  Minimize exposure to UV radiation to reduce your risk of skin cancer.  Safety  Always wear your seat belt while driving or riding in a vehicle.  Do not drive:  If you have been drinking alcohol. Do not ride with someone who has been drinking.  When you are tired or distracted.  While texting.  If you have been using any mind-altering substances or drugs.  Wear a helmet and other protective equipment during sports activities.  If you have firearms in your house, make sure you follow all gun safety procedures.  Seek help if you have been physically or sexually abused.  What's next?  Visit your health care provider once a year for an annual wellness visit.  Ask your health care provider how often you should have your eyes and teeth checked.  Stay up to date on all vaccines.  This information is not intended to replace advice given to you by your health care provider. Make sure you discuss any questions you have with your health care provider.  Document Revised: 04/05/2021 Document Reviewed: 04/05/2021  Elsevier Patient Education  2024 [employer].

## 2024-03-17 NOTE — Assessment & Plan Note (Signed)
 Ghm utd Check labs  See AVS  Health Maintenance  Topic Date Due   COVID-19 Vaccine (1) Never done   Pneumococcal Vaccine 40-50 Years old (1 of 2 - PCV) Never done   Cervical Cancer Screening (HPV/Pap Cotest)  08/25/2019   INFLUENZA VACCINE  05/22/2024   MAMMOGRAM  11/22/2024   Colonoscopy  06/14/2025   DTaP/Tdap/Td (3 - Td or Tdap) 11/01/2030   Hepatitis C Screening  Completed   HIV Screening  Completed   HPV VACCINES  Aged Out   Meningococcal B Vaccine  Aged Out

## 2024-03-17 NOTE — Progress Notes (Signed)
 Established Patient Office Visit  Subjective   Patient ID: Stacey Huerta, female    DOB: [birth year]  Age: 48 y.o. MRN: [id number]  Chief Complaint  Patient presents with   Annual Exam    Pt states not fasting     HPI Discussed the use of AI scribe software for clinical note transcription with the patient, who gave verbal consent to proceed.  History of Present Illness Stacey Huerta is a 48 year old female with dilated cardiomyopathy and heart failure who presents for an annual physical exam. She is accompanied by her partner, referred to as her 'other three quarters'.  She has ongoing issues with heart failure and dilated cardiomyopathy, managed by a cardiologist. She is currently taking Farxiga  and seroprolol. She reports challenges with weight loss. No chest pain, shortness of breath, or palpitations. She has a history of high lipoprotein (a) level, identified as a genetic issue, and is on a very low dose of rosuvastatin .  She has a history of bunion surgery with screw placement in her foot, performed by an orthopedic surgeon in [place], [place] . She continues to experience swelling in her foot and is considering consulting another doctor to evaluate the possibility of removing the screws.  She recently changed her insurance from [place] to [employer] due to a job change. She works for [employer], which is not a Dentist.  She is preparing for her 48th birthday and plans to travel to [facility] for a week-long vacation. She is interested in trying snorkeling and possibly scuba diving during her trip.   Patient Active Problem List   Diagnosis Date Noted   Allergy     Back pain    Dyspnea    Fibroid    Infertility, female    Leg edema    Osteoarthritis    Postpartum depression    Xerophthalmia 04/15/2023   Xerostomia 02/12/2023   Bilateral primary osteoarthritis of knee 02/12/2023   Seasonal affective disorder (HCC)  12/05/2022   Adjustment disorder with anxiety 12/05/2022   Major depressive disorder, recurrent episode, moderate (HCC) 11/21/2022   Preventative health care 11/01/2022   Depressive episode 11/01/2022   Lack of concentration 11/01/2022   Vitamin B12 deficiency 11/01/2022   Other fatigue 12/08/2019   Pansinusitis 12/23/2018   Influenza 12/23/2018   Other hyperlipidemia 10/08/2018   Insulin  resistance 10/08/2018   Vitamin D  deficiency 10/08/2018   Hyperglycemia 12/19/2015   Sjogren's syndrome (HCC) 10/28/2015   Dysphoric mood 12/14/2014   Preterm infant, birth weight 500-749 grams, with 24 completed weeks of gestation 03/21/2014   Preeclampsia 03/16/2014   Morbid obesity (HCC) 03/16/2014   Thrombocytopenia, unspecified (HCC) 03/16/2014   Urticaria due to food allergy  09/28/2012   CHOLECYSTECTOMY, LAPAROSCOPIC, HX OF 08/06/2008   OBESITY, UNSPECIFIED 04/06/2008   Past Medical History:  Diagnosis Date   Adjustment disorder with anxiety 12/05/2022   Allergy     Back pain    Bilateral primary osteoarthritis of knee 02/12/2023   Depressive episode 11/01/2022   Dysphoric mood 12/14/2014   Dyspnea    Fibroid    Hyperglycemia 12/19/2015   Infertility, female    Influenza 12/23/2018   Insulin  resistance 10/08/2018   Lack of concentration 11/01/2022   Leg edema    Major depressive disorder, recurrent episode, moderate (HCC) 11/21/2022   Morbid obesity (HCC) 03/16/2014   OBESITY, UNSPECIFIED 04/06/2008   Qualifier: Diagnosis of   By: Tracy Friedlander  Osteoarthritis    Other fatigue 12/08/2019   Other hyperlipidemia 10/08/2018   Pansinusitis 12/23/2018   Postpartum depression    Preeclampsia 03/16/2014   Preventative health care 11/01/2022   Seasonal affective disorder (HCC) 12/05/2022   Sjogren's syndrome (HCC) 10/28/2015   Thrombocytopenia, unspecified (HCC) 03/16/2014   Urticaria due to food allergy  09/28/2012   Allergy  Profile 07/23/12- specific elevations for dust  mite, dog dander, cockroach and some weed pollens.  Food IgG profile- elevated to all agents tested, as has been my experience with this test.   Allergy  Profile 09/15/12- Total IgE 536.8, specific elevations for orange, chicken, apple, shrimp, tuna  ANA 09/15/12- Pos speckled 1:160  ESR 23        Vitamin B12 deficiency 11/01/2022   Vitamin D  deficiency    Xerophthalmia 04/15/2023   Xerostomia 02/12/2023   Past Surgical History:  Procedure Laterality Date   ABDOMINAL ADHESION SURGERY     BUNIONECTOMY Right 10/2020   CESAREAN SECTION N/A 03/18/2014   Procedure: CESAREAN SECTION;  Surgeon: Madelene Schanz, MD;  Location: [facility];  Service: Obstetrics;  Laterality: N/A;  spinal/epidural   CHOLECYSTECTOMY     ENDOMETRIAL ABLATION     LYMPHADENECTOMY     Removed from the neck at age 71   MYOMECTOMY     MYOMECTOMY  05/23/2012   Procedure: MYOMECTOMY;  Surgeon: Asa Bjork, MD;  Location: [facility];  Service: Gynecology;  Laterality: N/A;   Social History   Tobacco Use   Smoking status: Never    Passive exposure: Current   Smokeless tobacco: Never  Vaping Use   Vaping status: Never Used  Substance Use Topics   Alcohol use: Yes    Alcohol/week: 0.0 standard drinks of alcohol    Comment: socially   Drug use: No    Comment: CBD Gummies   Social History   Socioeconomic History   Marital status: Significant Other    Spouse name: Arlina Lair   Number of children: Not on file   Years of education: Not on file   Highest education level: Not on file  Occupational History   Occupation: IT trainer    Employer: [employer]  Tobacco Use   Smoking status: Never    Passive exposure: Current   Smokeless tobacco: Never  Vaping Use   Vaping status: Never Used  Substance and Sexual Activity   Alcohol use: Yes    Alcohol/week: 0.0 standard drinks of alcohol    Comment: socially   Drug use: No    Comment: CBD Gummies   Sexual activity: Yes    Partners: Male    Birth  control/protection: None  Other Topics Concern   Not on file  Social History Narrative   Nurse, adult 3 days a week--- [employer]   Social Drivers of Health   Financial Resource Strain: Not on file  Food Insecurity: Not on file  Transportation Needs: Not on file  Physical Activity: Not on file  Stress: Not on file  Social Connections: Not on file  Intimate Partner Violence: Not on file   Family Status  Relation Name Status   PGF [place] Alive   Father Karlin Binion Alive   [facility]  Deceased   MGF  Alive   Mother Kallie Depolo Alive   MGM  Alive   Mat Aunt  (Not Specified)   Bernarda Bride  (Not Specified)   Mat Aunt Vertis Gosselin (Not Specified)   Neg Hx  (Not Specified)  No partnership data on  file   Family History  Problem Relation Age of Onset   Hypertension Paternal Grandfather    Diabetes Paternal Grandfather    Diabetes Father    Hypotension Father    Anemia Father    Colon cancer Paternal Grandmother    Diabetes Paternal Grandmother    Prostate cancer Maternal Grandfather    Hyperlipidemia Mother    Obesity Mother    Breast cancer Maternal Aunt    Arthritis Paternal Aunt    Hyperlipidemia Maternal Aunt        x's 2   Esophageal cancer Neg Hx    Stomach cancer Neg Hx    Allergies  Allergen Reactions   Estonia Nut (Berthollefia Excelsa) Anaphylaxis and Nausea And Vomiting    Other Reaction(s): GI Intolerance, Other (See Comments)  "ITCHY THROAT"   Egg-Derived Products    Metformin  And Related     Possible lactic acidosis   Shiitake Mushroom (Obsolete) Anaphylaxis   Sulfonamide Derivatives Hives and Rash    Also high fever   Maitake    Mushroom    Mushroom Extract Complex (Obsolete)    Penicillins     Childhood reaction   Reishi (Ganoderma Lucidum) (Obsolete)    Shellfish Allergy  Nausea And Vomiting   Fish-Derived Products Rash      Review of Systems  Constitutional:  Negative for chills, fever and malaise/fatigue.  HENT:   Negative for congestion and hearing loss.   Eyes:  Negative for blurred vision and discharge.  Respiratory:  Negative for cough, sputum production and shortness of breath.   Cardiovascular:  Negative for chest pain, palpitations and leg swelling.  Gastrointestinal:  Negative for abdominal pain, blood in stool, constipation, diarrhea, heartburn, nausea and vomiting.  Genitourinary:  Negative for dysuria, frequency, hematuria and urgency.  Musculoskeletal:  Negative for back pain, falls and myalgias.  Skin:  Negative for rash.  Neurological:  Negative for dizziness, sensory change, loss of consciousness, weakness and headaches.  Endo/Heme/Allergies:  Negative for environmental allergies. Does not bruise/bleed easily.  Psychiatric/Behavioral:  Negative for depression and suicidal ideas. The patient is not nervous/anxious and does not have insomnia.       Objective:     BP 124/88 (BP Location: Left Arm, Patient Position: Sitting, Cuff Size: Large)   Pulse 93   Temp 98.6 F (37 C) (Oral)   Resp 18   Ht 5\' 10"  (1.778 m)   Wt (!) 316 lb 12.8 oz (143.7 kg)   SpO2 98%   BMI 45.46 kg/m  BP Readings from Last 3 Encounters:  03/17/24 124/88  01/31/24 122/78  01/28/24 91/78   Wt Readings from Last 3 Encounters:  03/17/24 (!) 316 lb 12.8 oz (143.7 kg)  01/31/24 (!) 324 lb 4.8 oz (147.1 kg)  12/23/23 (!) 326 lb 4.8 oz (148 kg)   SpO2 Readings from Last 3 Encounters:  03/17/24 98%  01/31/24 98%  12/23/23 95%      Physical Exam Vitals and nursing note reviewed.  Constitutional:      General: She is not in acute distress.    Appearance: Normal appearance. She is well-developed.  HENT:     Head: Normocephalic and atraumatic.     Right Ear: Tympanic membrane, ear canal and external ear normal. There is no impacted cerumen.     Left Ear: Tympanic membrane, ear canal and external ear normal. There is no impacted cerumen.     Nose: Nose normal.     Mouth/Throat:     Mouth: Mucous  membranes are moist.     Pharynx: Oropharynx is clear. No oropharyngeal exudate or posterior oropharyngeal erythema.  Eyes:     General: No scleral icterus.       Right eye: No discharge.        Left eye: No discharge.     Conjunctiva/sclera: Conjunctivae normal.     Pupils: Pupils are equal, round, and reactive to light.  Neck:     Thyroid : No thyromegaly or thyroid  tenderness.     Vascular: No JVD.  Cardiovascular:     Rate and Rhythm: Normal rate and regular rhythm.     Heart sounds: Normal heart sounds. No murmur heard. Pulmonary:     Effort: Pulmonary effort is normal. No respiratory distress.     Breath sounds: Normal breath sounds.  Abdominal:     General: Bowel sounds are normal. There is no distension.     Palpations: Abdomen is soft. There is no mass.     Tenderness: There is no abdominal tenderness. There is no guarding or rebound.  Musculoskeletal:        General: Normal range of motion.     Cervical back: Normal range of motion and neck supple.     Right lower leg: No edema.     Left lower leg: No edema.  Lymphadenopathy:     Cervical: No cervical adenopathy.  Skin:    General: Skin is warm and dry.     Findings: No erythema or rash.  Neurological:     Mental Status: She is alert and oriented to person, place, and time.     Cranial Nerves: No cranial nerve deficit.     Deep Tendon Reflexes: Reflexes are normal and symmetric.  Psychiatric:        Mood and Affect: Mood normal.        Behavior: Behavior normal.        Thought Content: Thought content normal.        Judgment: Judgment normal.      No results found for any visits on 03/17/24.  Last CBC Lab Results  Component Value Date   WBC 3.4 01/31/2024   HGB 13.1 01/31/2024   HCT 40.4 01/31/2024   MCV 98 (H) 01/31/2024   MCH 31.7 01/31/2024   RDW 12.1 01/31/2024   PLT 166 01/31/2024   Last metabolic panel Lab Results  Component Value Date   GLUCOSE 87 01/31/2024   NA 140 01/31/2024   K 4.5  01/31/2024   CL 103 01/31/2024   CO2 24 01/31/2024   BUN 12 01/31/2024   CREATININE 1.06 (H) 01/31/2024   EGFR 64 01/31/2024   CALCIUM  9.6 01/31/2024   PROT 7.2 12/23/2023   ALBUMIN 3.8 (L) 12/23/2023   LABGLOB 3.4 12/23/2023   AGRATIO 1.0 (L) 05/12/2020   BILITOT 1.0 12/23/2023   ALKPHOS 104 12/23/2023   AST 20 12/23/2023   ALT 18 12/23/2023   ANIONGAP 6 10/11/2023   Last lipids Lab Results  Component Value Date   CHOL 138 12/23/2023   HDL 37 (L) 12/23/2023   LDLCALC 84 12/23/2023   TRIG 89 12/23/2023   CHOLHDL 3.7 12/23/2023   Last hemoglobin A1c Lab Results  Component Value Date   HGBA1C 5.2 11/01/2022   Last thyroid  functions Lab Results  Component Value Date   TSH 2.060 12/23/2023   T3TOTAL 110 06/26/2018   T4TOTAL 5.5 12/23/2023   Last vitamin D  Lab Results  Component Value Date   VD25OH 59.51 11/01/2022   Last  vitamin B12 and Folate Lab Results  Component Value Date   VITAMINB12 367 11/01/2022   FOLATE 7.6 06/26/2018      The 10-year ASCVD risk score (Arnett DK, et al., 2019) is: 1.9%    Assessment & Plan:   Problem List Items Addressed This Visit       Unprioritized   Vitamin D  deficiency   Relevant Orders   VITAMIN D  25 Hydroxy (Vit-D Deficiency, Fractures)   Morbid obesity (HCC)   Relevant Medications   Semaglutide -Weight Management 0.25 MG/0.5ML SOAJ   Semaglutide -Weight Management 0.5 MG/0.5ML SOAJ (Start on 04/15/2024)   Semaglutide -Weight Management 1 MG/0.5ML SOAJ (Start on 05/14/2024)   Semaglutide -Weight Management 1.7 MG/0.75ML SOAJ (Start on 06/12/2024)   Semaglutide -Weight Management 2.4 MG/0.75ML SOAJ (Start on 07/11/2024)   Other Relevant Orders   CBC with Differential/Platelet   Comprehensive metabolic panel with GFR   Lipid panel   TSH   Vitamin B12 deficiency   Relevant Orders   Vitamin B12   VITAMIN D  25 Hydroxy (Vit-D Deficiency, Fractures)   Sjogren's syndrome (HCC)   Insulin  resistance   Relevant Orders   CBC  with Differential/Platelet   Comprehensive metabolic panel with GFR   Lipid panel   Insulin , random   Hyperglycemia   Relevant Orders   Comprehensive metabolic panel with GFR   Hemoglobin A1c   Preventative health care - Primary   Ghm utd Check labs  See AVS  Health Maintenance  Topic Date Due   COVID-19 Vaccine (1) Never done   Pneumococcal Vaccine 22-72 Years old (1 of 2 - PCV) Never done   Cervical Cancer Screening (HPV/Pap Cotest)  08/25/2019   INFLUENZA VACCINE  05/22/2024   MAMMOGRAM  11/22/2024   Colonoscopy  06/14/2025   DTaP/Tdap/Td (3 - Td or Tdap) 11/01/2030   Hepatitis C Screening  Completed   HIV Screening  Completed   HPV VACCINES  Aged Out   Meningococcal B Vaccine  Aged Out         Other Visit Diagnoses       Congestive heart failure, unspecified HF chronicity, unspecified heart failure type (HCC)       Relevant Medications   Semaglutide -Weight Management 0.25 MG/0.5ML SOAJ   Semaglutide -Weight Management 0.5 MG/0.5ML SOAJ (Start on 04/15/2024)   Semaglutide -Weight Management 1 MG/0.5ML SOAJ (Start on 05/14/2024)   Semaglutide -Weight Management 1.7 MG/0.75ML SOAJ (Start on 06/12/2024)   Semaglutide -Weight Management 2.4 MG/0.75ML SOAJ (Start on 07/11/2024)     Cardiomyopathy, unspecified type (HCC)       Relevant Medications   Semaglutide -Weight Management 0.25 MG/0.5ML SOAJ   Semaglutide -Weight Management 0.5 MG/0.5ML SOAJ (Start on 04/15/2024)   Semaglutide -Weight Management 1 MG/0.5ML SOAJ (Start on 05/14/2024)   Semaglutide -Weight Management 1.7 MG/0.75ML SOAJ (Start on 06/12/2024)   Semaglutide -Weight Management 2.4 MG/0.75ML SOAJ (Start on 07/11/2024)     Assessment and Plan Assessment & Plan Heart failure with dilated cardiomyopathy   Diagnosed with heart failure and dilated cardiomyopathy, she is currently on Farxiga  and seroprolol. High lipoprotein (a) levels are likely genetic, and she is on a low dose of rosuvastatin . Discussed the potential use  of Wegovy (Ozempic ) for weight loss, which may benefit her heart condition. The cardiologist has not prescribed newer medications like Onesto. Continue Farxiga  and seroprolol. Prescribe Wegovy for weight loss, monitoring for at least 1 pound per week as required by insurance. Educate on potential side effects of Wegovy, including nausea, abdominal pain, and constipation. Advise having nausea medication and constipation remedies like Ducalex or Senokot  available. Schedule a follow-up in 3 months to monitor weight and medication efficacy.  Bunion with post-surgical complications   She is experiencing post-surgical complications from bunion surgery, including persistent swelling and possible infection, with consideration for screw removal. The original surgery was performed by an orthopedic surgeon in [place], [place]. Refer to Dr. Rosebud Confer at [facility] or Dr. Lydia Sams at [facility] for evaluation. Advise obtaining previous surgical records for the new consultation.    No follow-ups on file.    Leroy Pettway R Lowne Chase, DO

## 2024-03-18 LAB — HEMOGLOBIN A1C: Hgb A1c MFr Bld: 5.8 % (ref 4.6–6.5)

## 2024-03-18 LAB — COMPREHENSIVE METABOLIC PANEL WITH GFR
ALT: 15 U/L (ref 0–35)
AST: 16 U/L (ref 0–37)
Albumin: 4.1 g/dL (ref 3.5–5.2)
Alkaline Phosphatase: 88 U/L (ref 39–117)
BUN: 18 mg/dL (ref 6–23)
CO2: 32 meq/L (ref 19–32)
Calcium: 9.8 mg/dL (ref 8.4–10.5)
Chloride: 100 meq/L (ref 96–112)
Creatinine, Ser: 1.16 mg/dL (ref 0.40–1.20)
GFR: 55.2 mL/min — ABNORMAL LOW (ref >60.00)
Glucose, Bld: 86 mg/dL (ref 70–99)
Potassium: 4.2 meq/L (ref 3.5–5.1)
Sodium: 138 meq/L (ref 135–145)
Total Bilirubin: 1.2 mg/dL (ref 0.2–1.2)
Total Protein: 7.8 g/dL (ref 6.0–8.3)

## 2024-03-18 LAB — CBC WITH DIFFERENTIAL/PLATELET
Basophils Absolute: 0 10*3/uL (ref 0.0–0.1)
Basophils Relative: 0.8 % (ref 0.0–3.0)
Eosinophils Absolute: 0 10*3/uL (ref 0.0–0.7)
Eosinophils Relative: 0.8 % (ref 0.0–5.0)
HCT: 40.7 % (ref 36.0–46.0)
Hemoglobin: 13.9 g/dL (ref 12.0–15.0)
Lymphocytes Relative: 20.3 % (ref 12.0–46.0)
Lymphs Abs: 1.1 10*3/uL (ref 0.7–4.0)
MCHC: 34 g/dL (ref 30.0–36.0)
MCV: 95.2 fl (ref 78.0–100.0)
Monocytes Absolute: 0.4 10*3/uL (ref 0.1–1.0)
Monocytes Relative: 6.9 % (ref 3.0–12.0)
Neutro Abs: 3.7 10*3/uL (ref 1.4–7.7)
Neutrophils Relative %: 71.2 % (ref 43.0–77.0)
Platelets: 138 10*3/uL — ABNORMAL LOW (ref 150.0–400.0)
RBC: 4.28 Mil/uL (ref 3.87–5.11)
RDW: 12.4 % (ref 11.5–15.5)
WBC: 5.2 10*3/uL (ref 4.0–10.5)

## 2024-03-18 LAB — LIPID PANEL
Cholesterol: 122 mg/dL (ref 0–200)
HDL: 37.1 mg/dL — ABNORMAL LOW (ref >39.00)
LDL Cholesterol: 65 mg/dL (ref 0–99)
NonHDL: 84.52
Total CHOL/HDL Ratio: 3
Triglycerides: 96 mg/dL (ref 0.0–149.0)
VLDL: 19.2 mg/dL (ref 0.0–40.0)

## 2024-03-18 LAB — TSH: TSH: 3.84 u[IU]/mL (ref 0.35–5.50)

## 2024-03-18 LAB — VITAMIN B12: Vitamin B-12: 435 pg/mL (ref 211–911)

## 2024-03-18 LAB — VITAMIN D 25 HYDROXY (VIT D DEFICIENCY, FRACTURES): VITD: 81.76 ng/mL (ref 30.00–100.00)

## 2024-03-19 LAB — INSULIN, RANDOM: Insulin: 20.4 u[IU]/mL — ABNORMAL HIGH

## 2024-03-20 ENCOUNTER — Ambulatory Visit (HOSPITAL_BASED_OUTPATIENT_CLINIC_OR_DEPARTMENT_OTHER): Admitting: Family

## 2024-03-20 VITALS — BP 110/90 | HR 100 | Ht 70.0 in | Wt 318.7 lb

## 2024-03-20 DIAGNOSIS — E7849 Other hyperlipidemia: Secondary | ICD-10-CM

## 2024-03-20 DIAGNOSIS — I5022 Chronic systolic (congestive) heart failure: Secondary | ICD-10-CM | POA: Diagnosis not present

## 2024-03-20 DIAGNOSIS — I952 Hypotension due to drugs: Secondary | ICD-10-CM

## 2024-03-20 DIAGNOSIS — M3500 Sicca syndrome, unspecified: Secondary | ICD-10-CM

## 2024-03-20 MED ORDER — METOPROLOL SUCCINATE ER 25 MG PO TB24: 25.0000 mg | ORAL_TABLET | Freq: Every day | ORAL | 3 refills | Status: DC

## 2024-03-20 MED ORDER — SEMAGLUTIDE-WEIGHT MANAGEMENT 0.25 MG/0.5ML ~~LOC~~ SOAJ
0.2500 mg | SUBCUTANEOUS | Status: AC
Start: 2024-03-20 — End: 2024-04-19

## 2024-03-20 MED ORDER — LOSARTAN POTASSIUM 25 MG PO TABS: 12.5000 mg | ORAL_TABLET | Freq: Every day | ORAL | 3 refills | Status: DC

## 2024-03-20 MED ORDER — FUROSEMIDE 20 MG PO TABS: 20.0000 mg | ORAL_TABLET | Freq: Every day | ORAL | 1 refills | Status: DC

## 2024-03-20 MED ORDER — DAPAGLIFLOZIN PROPANEDIOL 10 MG PO TABS: 10.0000 mg | ORAL_TABLET | Freq: Every day | ORAL | 5 refills | Status: DC

## 2024-03-20 NOTE — Progress Notes (Signed)
 Cardiology Office Note:  .   Date:  03/20/2024  ID:  Stacey Huerta, DOB 29-Jun-1976, MRN [id number] PCP: Crecencio Dodge, Candida Chalk, DO  [facility] Providers Cardiologist:  Sheryle Donning, MD    History of Present Illness: .   Stacey Huerta is a 48 y.o. female with history of severe preeclampsia/HELLP syndrome, Sjogren's, systolic heart failure.  Established with Dr. Veryl Gottron 11/20/2023.  She noted frequent syncopal spells in her 61s with cardiology workup including EKG/orthostatics unremarkable but unclear whether she had echocardiogram.  In her 30s and 40s no recurrent syncope but did have hypotension.  At age 1 had severe preeclampsia/HELLP  syndrome in 6 weeks after delivery her blood pressure normalized.  Prior echocardiogram 2014 due to syncope LVEF 45-50%.  December 2024 increased exertional dyspnea, fluid retention.  BNP in ED 584, CXR with cardiomegaly and no acute process.  Was started on diuretics.  At initial visit 11/20/2023 she was recommended for echocardiogram, reassessment of lipids with next labs.  Echocardiogram 12/17/2023 LVEF less than 20%, LV global hypokinesis, LV moderate to severely dilated, RV SF moderately reduced, normal PASP, bilateral atria moderately dilated, moderate to severe MR. Based on results, Losartan  12.5mg  daily was initiated. She reported using Lasix  3 x per week when she felt profoundly bloated. Medication titration limited by relative hypotension.   At visit 12/23/23 Losartan  12.5mg  daily initiated. BNP that day 540.6 with recommendation for Lasix  40mg  daily x 2 days then 20mg  daily. Based on Lp(a) 112.5 Rosuvastatin  5mg  daily initiated. Cardiac CTA ordered for ischemic evaluation, however unable to be completed as BP too low with Metoprolol  dosing prior and even with utilization of Ivabradine  BP would not tolerate nitroglycerin for study images.   Last seen 01/31/24. Toprol  25mg  daily continued. Farxiga  10mg  daily initiated. Lasix   changed to PRN. Caridac PET upcoming 05/05/24.  Presents today for follow up. She is taking Furosemide  20mg  daily. Notes if she misses dose, weight will increase. Weight today down 6 lbs from clinic visit 2.5 months ago. At visit with PCP 03/17/24 Wegovy  was prescribed, has not yet started. 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 Notes persistent exertional dyspnea. No lower extremity edema. Does feel if she is holding onto fluid that it is more an abdominal bloating. Adhering to low salt diet as much as she can particularly while traveling. Often drinking more than 64 oz. Reviewed fluid restrictions.   ROS: Please see the history of present illness.    All other systems reviewed and are negative.   Studies Reviewed: .           Risk Assessment/Calculations:            Physical Exam:   VS:  BP (!) 110/90   Pulse 100   Ht 5\' 10"  (1.778 m)   Wt (!) 318 lb 11.2 oz (144.6 kg)   SpO2 99%   BMI 45.73 kg/m    Wt Readings from Last 3 Encounters:  03/20/24 (!) 318 lb 11.2 oz (144.6 kg)  03/17/24 (!) 316 lb 12.8 oz (143.7 kg)  01/31/24 (!) 324 lb 4.8 oz (147.1 kg)    GEN: Well nourished, overweight, well developed in no acute distress NECK: No JVD; No carotid bruits CARDIAC: RRR, no murmurs, rubs, gallops RESPIRATORY:  Clear to auscultation without rales, wheezing or rhonchi  ABDOMEN: Soft, non-tender, non-distended EXTREMITIES:  No edema; No deformity   ASSESSMENT AND PLAN: .    HFrEF / Dilated cardiomyopathy / DOE - Echo 11/2023 LVEF <  20%, moderate to severe MR, RVSF moderately reduced. Etiology of heart failure unclear.NYHA II symptoms.  Medication management Continue Losartan  12.5mg  daily. Continue Metoprolol  succinate 25mg  daily.  Continue Farxiga  10mg  daily. Samples provided and copay card.  Continue Lasix  to 20mg  daily (saw fluid retention with weight gain when PRN) BP limits further titration of GDMT.  Low sodium diet <2g Na, fluid restriction <2L, and daily weights  encouraged. Educated to contact our office for weight gain of 2 lbs overnight or 5 lbs in one week.  Heart failure etiology workup: TSH 12/23/23 2.06 (normal).  No snoring nor daytime somnolence, defer sleep study.  Cardiac CTA unable to be performed due to hypotension despite utilization of Ivabradine  in place of Metoprolol . Plan for cardiac PET for ischemic evaluation to rule out ICM.  Refer to Advanced Heart Failure clinic as medication titration limited by hypotension to consider additional treatment modalities.   Hypotension - Baseline hypotension is longstanding and often not symtpomatic until BP <98/60. Discussed to continue monitor BP at home at least 2 hours after medications and sitting for 5-10 minutes. Monitor carefully with titration of GDMT for HF.   CV risk factor (Hx of preeclampsia/ HELLP) / Familial HLD - 12/23/23 Lp(a) 112.5, LDL 84. Continue Rosuvastatin  5mg  daily for cardiovascular risk reduction.   Sjogren's - Her Plaquenil  for Sjogren's is on hold for 6 months per Dr. Rodell Citrin and monitor symptoms of dry eyes, arthralgia.        Dispo: follow up with Heart Failure Clinic and with Dr. Veryl Gottron in 6 months  Signed, Clearnce Curia, NP

## 2024-03-20 NOTE — Patient Instructions (Signed)
 Medication Instructions:  Your physician recommends that you continue on your current medications as directed. Please refer to the Current Medication list given to you today.   You received a Wegovy sample.  Testing/Procedures:  How to Prepare for Your Cardiac PET/CT Stress Test:  Nothing to eat or drink, except water, 3 hours prior to arrival time.  NO caffeine/decaffeinated products, or chocolate 12 hours prior to arrival. (Please note decaffeinated beverages (teas/coffees) still contain caffeine).  If you have caffeine within 12 hours prior, the test will need to be rescheduled.  Medication instructions: Do not take erectile dysfunction medications for 72 hours prior to test (sildenafil, tadalafil) Do not take nitrates (isosorbide mononitrate, Ranexa) the day before or day of test Do not take tamsulosin the day before or morning of test Hold theophylline containing medications for 12 hours. Hold Dipyridamole 48 hours prior to the test.  Diabetic Preparation: If able to eat breakfast prior to 3 hour fasting, you may take all medications, including your insulin . Do not worry if you miss your breakfast dose of insulin  - start at your next meal. If you do not eat prior to 3 hour fast-Hold all diabetes (oral and insulin ) medications. Patients who wear a continuous glucose monitor MUST remove the device prior to scanning.  You may take your remaining medications with water.  NO perfume, cologne or lotion on chest or abdomen area. FEMALES - Please avoid wearing dresses to this appointment.  Total time is 1 to 2 hours; you may want to bring reading material for the waiting time.  IF YOU THINK YOU MAY BE PREGNANT, OR ARE NURSING PLEASE INFORM THE TECHNOLOGIST.  In preparation for your appointment, medication and supplies will be purchased.  Appointment availability is limited, so if you need to cancel or reschedule, please call the Radiology Department Scheduler at [phone] 24 hours in  advance to avoid a cancellation fee of $100.00  What to Expect When you Arrive:  Once you arrive and check in for your appointment, you will be taken to a preparation room within the Radiology Department.  A technologist or Nurse will obtain your medical history, verify that you are correctly prepped for the exam, and explain the procedure.  Afterwards, an IV will be started in your arm and electrodes will be placed on your skin for EKG monitoring during the stress portion of the exam. Then you will be escorted to the PET/CT scanner.  There, staff will get you positioned on the scanner and obtain a blood pressure and EKG.  During the exam, you will continue to be connected to the EKG and blood pressure machines.  A small, safe amount of a radioactive tracer will be injected in your IV to obtain a series of pictures of your heart along with an injection of a stress agent.    After your Exam:  It is recommended that you eat a meal and drink a caffeinated beverage to counter act any effects of the stress agent.  Drink plenty of fluids for the remainder of the day and urinate frequently for the first couple of hours after the exam.  Your doctor will inform you of your test results within 7-10 business days.  For more information and frequently asked questions, please visit our website: [email or web address]  For questions about your test or how to prepare for your test, please call: Cardiac Imaging Nurse Navigators Office: [phone]    Follow-Up: Your next appointment:   6 month(s)  Provider:   Sheryle Donning, MD  or Neomi Banks, NP    Other Instructions Heart Failure Clinic will reach to follow up

## 2024-03-22 ENCOUNTER — Encounter (HOSPITAL_BASED_OUTPATIENT_CLINIC_OR_DEPARTMENT_OTHER): Payer: Self-pay | Admitting: Family

## 2024-03-23 ENCOUNTER — Ambulatory Visit: Payer: Self-pay | Admitting: Family Medicine

## 2024-03-31 ENCOUNTER — Other Ambulatory Visit (HOSPITAL_COMMUNITY): Payer: Self-pay

## 2024-03-31 ENCOUNTER — Telehealth: Payer: Self-pay

## 2024-03-31 NOTE — Telephone Encounter (Signed)
 Pharmacy Patient Advocate Encounter   Received notification from CoverMyMeds that prior authorization for Wegovy  0.25MG /0.5ML auto-injectors  is required/requested.   Insurance verification completed.   The patient is insured through [facility] .   Per test claim: PA required; PA submitted to above mentioned insurance via CoverMyMeds Key/confirmation #/EOC [facility] Status is pending

## 2024-04-01 ENCOUNTER — Other Ambulatory Visit (HOSPITAL_COMMUNITY): Payer: Self-pay

## 2024-04-01 NOTE — Telephone Encounter (Signed)
 Pharmacy Patient Advocate Encounter  Received notification from [facility] that Prior Authorization for Wegovy  0.25MG /0.5ML auto-injectors  has been DENIED.  See denial reason below. No denial letter attached in CMM. Will attach denial letter to Media tab once received.   PA #/Case ID/Reference #: [id number]

## 2024-04-03 ENCOUNTER — Telehealth: Payer: Self-pay | Admitting: Pharmacist

## 2024-04-03 NOTE — Telephone Encounter (Signed)
 To proceed with the appeal for Wegovy  coverage, please provide the dates the patient participated in a nutrition program. Documentation must confirm participation for at least six months, as required by the insurance denial. Please note that insurance providers are becoming increasingly stringent with the documentation needed to approve GLP-1 medications, making thorough and accurate records essential for a successful appeal.  Thank you, Dene Fines, PharmD Clinical Pharmacist  [facility]  Direct Dial: [phone]

## 2024-04-23 ENCOUNTER — Other Ambulatory Visit (HOSPITAL_COMMUNITY): Payer: Self-pay

## 2024-05-01 ENCOUNTER — Encounter (HOSPITAL_COMMUNITY): Payer: Self-pay

## 2024-05-04 ENCOUNTER — Telehealth (HOSPITAL_COMMUNITY): Payer: Self-pay | Admitting: *Deleted

## 2024-05-04 NOTE — Telephone Encounter (Signed)
 Attempted to call patient regarding upcoming cardiac PET appointment. Left message on voicemail with name and callback number Johney Frame RN Navigator Cardiac Imaging [facility] Heart [facility] Vascular Services [phone] Office

## 2024-05-05 ENCOUNTER — Encounter (HOSPITAL_COMMUNITY)
Admission: RE | Admit: 2024-05-05 | Discharge: 2024-05-05 | Disposition: A | Discharge status: Home / Self Care | Source: Ambulatory Visit | Attending: Family | Admitting: Family

## 2024-05-05 DIAGNOSIS — I5022 Chronic systolic (congestive) heart failure: Secondary | ICD-10-CM | POA: Insufficient documentation

## 2024-05-05 LAB — NM PET CT CARDIAC PERFUSION MULTI W/ABSOLUTE BLOODFLOW
LV dias vol: 126 mL (ref 46–106)
LV sys vol: 109 mL (ref 3.8–5.2)
MBFR: 1.9
Nuc Rest EF: 13 %
Nuc Stress EF: 23 %
Rest MBF: 0.61 ml/g/min
Rest Nuclear Isotope Dose: 30.2 mCi
ST Depression (mm): 0 mm
Stress MBF: 1.16 ml/g/min
Stress Nuclear Isotope Dose: 30.2 mCi

## 2024-05-05 MED ORDER — REGADENOSON 0.4 MG/5ML IV SOLN
INTRAVENOUS | Status: AC
  Filled 2024-05-05: qty 5

## 2024-05-05 MED ORDER — REGADENOSON 0.4 MG/5ML IV SOLN
0.4000 mg | Freq: Once | INTRAVENOUS | Status: AC
  Administered 2024-05-05: 0.4 mg via INTRAVENOUS

## 2024-05-05 MED ORDER — RUBIDIUM RB82 GENERATOR (RUBYFILL)
30.1900 | PACK | Freq: Once | INTRAVENOUS | Status: AC
  Administered 2024-05-05: 30.19 via INTRAVENOUS

## 2024-05-06 ENCOUNTER — Ambulatory Visit (HOSPITAL_BASED_OUTPATIENT_CLINIC_OR_DEPARTMENT_OTHER): Payer: Self-pay | Admitting: Family

## 2024-05-13 ENCOUNTER — Encounter (HOSPITAL_COMMUNITY): Admitting: Cardiology

## 2024-05-18 ENCOUNTER — Telehealth (HOSPITAL_COMMUNITY): Payer: Self-pay | Admitting: Cardiology

## 2024-05-18 NOTE — Telephone Encounter (Signed)
 Called to confirm/remind patient of their appointment at the [facility] on 05/18/24.   Appointment:   [x] Confirmed  [] Left mess   [] No answer/No voice mail  [] VM Full/unable to leave message  [] Phone not in service  Patient reminded to bring all medications and/or complete list.  Confirmed patient has transportation. Gave directions, instructed to utilize valet parking.

## 2024-05-19 ENCOUNTER — Ambulatory Visit (HOSPITAL_COMMUNITY)
Admission: RE | Admit: 2024-05-19 | Discharge: 2024-05-19 | Disposition: A | Discharge status: Home / Self Care | Source: Ambulatory Visit | Attending: Cardiology | Admitting: Cardiology

## 2024-05-19 ENCOUNTER — Encounter (HOSPITAL_COMMUNITY): Payer: Self-pay | Admitting: Cardiology

## 2024-05-19 ENCOUNTER — Other Ambulatory Visit (HOSPITAL_COMMUNITY): Payer: Self-pay

## 2024-05-19 VITALS — BP 122/80 | HR 100 | Ht 70.0 in | Wt 311.4 lb

## 2024-05-19 DIAGNOSIS — I5022 Chronic systolic (congestive) heart failure: Secondary | ICD-10-CM

## 2024-05-19 LAB — BASIC METABOLIC PANEL WITH GFR
Anion gap: 8 (ref 5–15)
BUN: 10 mg/dL (ref 6–20)
CO2: 27 mmol/L (ref 22–32)
Calcium: 9.1 mg/dL (ref 8.9–10.3)
Chloride: 102 mmol/L (ref 98–111)
Creatinine, Ser: 1.04 mg/dL — ABNORMAL HIGH (ref 0.44–1.00)
GFR, Estimated: >60 mL/min (ref >60)
Glucose, Bld: 91 mg/dL (ref 70–99)
Potassium: 3.5 mmol/L (ref 3.5–5.1)
Sodium: 137 mmol/L (ref 135–145)

## 2024-05-19 LAB — BRAIN NATRIURETIC PEPTIDE: B Natriuretic Peptide: 507.7 pg/mL — ABNORMAL HIGH (ref 0.0–100.0)

## 2024-05-19 MED ORDER — FUROSEMIDE 20 MG PO TABS: 40.0000 mg | ORAL_TABLET | Freq: Every day | ORAL | 3 refills | Status: DC

## 2024-05-19 NOTE — Patient Instructions (Signed)
 CHANGE Lasix  to 40 mg daily.  Labs done today, your results will be available in MyChart, we will contact you for abnormal readings.  Your physician has requested that you have a cardiac MRI. Cardiac MRI uses a computer to create images of your heart as its beating, producing both still and moving pictures of your heart and major blood vessels. For further information please visit [email or web address]. Please follow the instruction sheet given to you today for more information.  You are scheduled for a Cardiac Catheterization on Friday, August 22 with Dr. Odis Brownie.  1. Please arrive at the [facility] (Main Entrance A) at [facility]: [address] [place], [place] [address] at 5:30 AM (This time is 2 hour(s) before your procedure to ensure your preparation).   Free valet parking service is available. You will check in at ADMITTING. The support person will be asked to wait in the waiting room.  It is OK to have someone drop you off and come back when you are ready to be discharged.    Special note: Every effort is made to have your procedure done on time. Please understand that emergencies sometimes delay scheduled procedures.  2. Diet: NPO no eating or drinking after midnight, except sips of water with medication.  3. Medication instructions in preparation for your procedure:   Contrast Allergy : No  HOLD YOUR LASIX  THE MORNING OF YOUR PROCEDURE.  On the morning of your procedure, take any morning medicines NOT listed above.  You may use sips of water.  6. Plan to go home the same day, you will only stay overnight if medically necessary. 7. Bring a current list of your medications and current insurance cards. 8. You MUST have a responsible person to drive you home. 9. Someone MUST be with you the first 24 hours after you arrive home or your discharge will be delayed. 10. Please wear clothes that are easy to get on and off and wear slip-on shoes.  Your physician recommends that  you schedule a follow-up appointment in: 1 MONTH.  If you have any questions or concerns before your next appointment please send us  a message through [place] or call our office at [phone].    TO LEAVE A MESSAGE FOR THE NURSE SELECT OPTION 2, PLEASE LEAVE A MESSAGE INCLUDING: YOUR NAME DATE OF BIRTH CALL BACK NUMBER REASON FOR CALL**this is important as we prioritize the call backs  YOU WILL RECEIVE A CALL BACK THE SAME DAY AS LONG AS YOU CALL BEFORE 4:00 PM  At the [facility], you and your health needs are our priority. As part of our continuing mission to provide you with exceptional heart care, we have created designated Provider Care Teams. These Care Teams include your primary Cardiologist (physician) and Advanced Practice Providers (APPs- Physician Assistants and Nurse Practitioners) who all work together to provide you with the care you need, when you need it.   You may see any of the following providers on your designated Care Team at your next follow up: Dr Toribio Fuel Dr Ezra Shuck Dr. Ria Commander Dr. Morene Brownie Amy Lenetta, NP Caffie Shed, [place] [facility], [place] Beckey Coe, NP [place] Lee, NP Ellouise Class, NP Tinnie Redman, PharmD Jaun Bash, PharmD   Please be sure to bring in all your medications bottles to every appointment.    Thank you for choosing [facility]

## 2024-05-22 NOTE — Progress Notes (Signed)
 ADVANCED HEART FAILURE NEW PATIENT CLINIC NOTE  Referring Physician: Antonio Cyndee Jamee JONELLE, *  Primary Care: Antonio Cyndee Jamee JONELLE, DO Primary Cardiologist:  HPI: Stacey Huerta is a 48 y.o. female with a PMH of HFrEF, palpitations who presents for initial visit for further evaluation and treatment of heart failure/cardiomyopathy.      Established with Dr. Lonni 11/20/2023. She noted frequent syncopal spells in her 98s with cardiology workup including EKG/orthostatics unremarkable but unclear whether she had echocardiogram. In her 30s and 40s no recurrent syncope but did have hypotension. At age 53 had severe preeclampsia/HELLP syndrome in 6 weeks after delivery her blood pressure normalized.   Prior echocardiogram 2014 due to syncope LVEF 45-50%.   Echocardiogram 12/17/2023 LVEF less than 20%, LV global hypokinesis, LV moderate to severely dilated, RV SF moderately reduced, normal PASP, bilateral atria moderately dilated, moderate to severe MR. Based on results, Losartan  12.5mg  daily was initiated. She reported using Lasix  3 x per week when she felt profoundly bloated. Medication titration limited by relative hypotension.        SUBJECTIVE:  Patient reports that overall she has not been doing well.  She gets short of breath with anything more than mild exertion and has found it difficult to carry out her activities of daily living.  She is still working and is planning on taking a trip to [place] this week, but is otherwise limited. She reports minimal improvement with multiple recent interventions, feeling bloated and overloaded. Her heart rate remains elevated as well. Overall concerned about her cardiac function. No family history of CAD.   PMH, current medications, allergies, social history, and family history reviewed in epic.  PHYSICAL EXAM: Vitals:   05/19/24 1420  BP: 122/80  Pulse: 100  SpO2: 96%   GENERAL: NAD PULM:  Normal work of breathing, clear to  auscultation bilaterally. Respirations are unlabored.  CARDIAC:  JVP: mildly elevated         tachycardic rate with regular rhythm. No murmurs, rubs or gallops.  Trace edema. Warm and well perfused extremities. ABDOMEN: Soft, non-tender, non-distended. NEUROLOGIC: Patient is oriented x3 with no focal or lateralizing neurologic deficits.    DATA REVIEW  ECG: 05/19/2024: Sinus rhythm with significantly prolonged first-degree AV block  ECHO: 11/2023: LVEF less than 20%, moderate to severe dilation, RV moderately reduced  CATH: None  PET: 05/05/2024: Normal LV perfusion, rest ejection fraction 13%, abnormal myocardial blood flow   Heart failure review: - Classification: Heart failure with reduced EF - Etiology: Work up ongoing - NYHA Class: III - Volume status: Hypervolemic - ACEi/ARB/ARNI: Currently up-titrating - Aldosterone antagonist: Plan to start at a subsequent visit - Beta-blocker: Currently up-titrating - Digoxin: Plan to start at a subsequent visit - Hydralazine/Nitrates: Not indicated - SGLT2i: Maximally tolerated dose - GLP-1: Consider in future - Advanced therapies: Not needed at this time - ICD: Currently uptitrating GDMT   ASSESSMENT & PLAN:  Chronic systolic heart failure: Unclear etiology, recent cardiac PET scan shows no evidence of ischemia.  She is tachycardic with low baseline blood pressure, mildly volume overloaded and is concerning for advanced disease given her inability to tolerate GDMT. - Plan for right heart catheterization to evaluate filling pressures as well as cardiac output - CMR ordered to evaluate for infiltrative disease and scar burden - Increase Lasix  to 40 mg daily - Continue Farxiga  10 mg daily - Continue losartan  12.5 mg daily, metoprolol  12.5 mg daily - Spironolactone after catheterization if appropriate  Obesity: Longstanding,  wegovy  was denied. - Would likely benefit from cardiac rehab - Consider referral for alternative GLP-1  I  have reviewed the risks, indications, and alternatives to cardiac catheterization +/- angioplasty or stenting with the patient. Risks include but are not limited to bleeding, infection, vascular injury, stroke, myocardial infection, arrhythmia, kidney injury, radiation-related injury in the case of prolonged fluoroscopy use, emergency cardiac surgery, and death. The patient understands the risks of serious complication is low (<1%) and he agrees to proceed.    I spent 70 minutes caring for this patient today including face to face time, ordering and reviewing labs, reviewing records from Dr. Lonni, PET, echo images personally reviewed, seeing the patient, documenting in the record, and arranging follow ups.     Follow up in 2 months  Morene Brownie, MD Advanced Heart Failure Mechanical Circulatory Support 05/22/24

## 2024-05-22 NOTE — H&P (View-Only) (Signed)
 ADVANCED HEART FAILURE NEW PATIENT CLINIC NOTE  Referring Physician: Antonio Cyndee Jamee JONELLE, *  Primary Care: Antonio Cyndee Jamee JONELLE, DO Primary Cardiologist:  HPI: Stacey Huerta is a 48 y.o. female with a PMH of HFrEF, palpitations who presents for initial visit for further evaluation and treatment of heart failure/cardiomyopathy.      Established with Dr. Lonni 11/20/2023. She noted frequent syncopal spells in her 98s with cardiology workup including EKG/orthostatics unremarkable but unclear whether she had echocardiogram. In her 30s and 40s no recurrent syncope but did have hypotension. At age 53 had severe preeclampsia/HELLP syndrome in 6 weeks after delivery her blood pressure normalized.   Prior echocardiogram 2014 due to syncope LVEF 45-50%.   Echocardiogram 12/17/2023 LVEF less than 20%, LV global hypokinesis, LV moderate to severely dilated, RV SF moderately reduced, normal PASP, bilateral atria moderately dilated, moderate to severe MR. Based on results, Losartan  12.5mg  daily was initiated. She reported using Lasix  3 x per week when she felt profoundly bloated. Medication titration limited by relative hypotension.        SUBJECTIVE:  Patient reports that overall she has not been doing well.  She gets short of breath with anything more than mild exertion and has found it difficult to carry out her activities of daily living.  She is still working and is planning on taking a trip to [place] this week, but is otherwise limited. She reports minimal improvement with multiple recent interventions, feeling bloated and overloaded. Her heart rate remains elevated as well. Overall concerned about her cardiac function. No family history of CAD.   PMH, current medications, allergies, social history, and family history reviewed in epic.  PHYSICAL EXAM: Vitals:   05/19/24 1420  BP: 122/80  Pulse: 100  SpO2: 96%   GENERAL: NAD PULM:  Normal work of breathing, clear to  auscultation bilaterally. Respirations are unlabored.  CARDIAC:  JVP: mildly elevated         tachycardic rate with regular rhythm. No murmurs, rubs or gallops.  Trace edema. Warm and well perfused extremities. ABDOMEN: Soft, non-tender, non-distended. NEUROLOGIC: Patient is oriented x3 with no focal or lateralizing neurologic deficits.    DATA REVIEW  ECG: 05/19/2024: Sinus rhythm with significantly prolonged first-degree AV block  ECHO: 11/2023: LVEF less than 20%, moderate to severe dilation, RV moderately reduced  CATH: None  PET: 05/05/2024: Normal LV perfusion, rest ejection fraction 13%, abnormal myocardial blood flow   Heart failure review: - Classification: Heart failure with reduced EF - Etiology: Work up ongoing - NYHA Class: III - Volume status: Hypervolemic - ACEi/ARB/ARNI: Currently up-titrating - Aldosterone antagonist: Plan to start at a subsequent visit - Beta-blocker: Currently up-titrating - Digoxin: Plan to start at a subsequent visit - Hydralazine/Nitrates: Not indicated - SGLT2i: Maximally tolerated dose - GLP-1: Consider in future - Advanced therapies: Not needed at this time - ICD: Currently uptitrating GDMT   ASSESSMENT & PLAN:  Chronic systolic heart failure: Unclear etiology, recent cardiac PET scan shows no evidence of ischemia.  She is tachycardic with low baseline blood pressure, mildly volume overloaded and is concerning for advanced disease given her inability to tolerate GDMT. - Plan for right heart catheterization to evaluate filling pressures as well as cardiac output - CMR ordered to evaluate for infiltrative disease and scar burden - Increase Lasix  to 40 mg daily - Continue Farxiga  10 mg daily - Continue losartan  12.5 mg daily, metoprolol  12.5 mg daily - Spironolactone after catheterization if appropriate  Obesity: Longstanding,  wegovy  was denied. - Would likely benefit from cardiac rehab - Consider referral for alternative GLP-1  I  have reviewed the risks, indications, and alternatives to cardiac catheterization +/- angioplasty or stenting with the patient. Risks include but are not limited to bleeding, infection, vascular injury, stroke, myocardial infection, arrhythmia, kidney injury, radiation-related injury in the case of prolonged fluoroscopy use, emergency cardiac surgery, and death. The patient understands the risks of serious complication is low (<1%) and he agrees to proceed.    I spent 70 minutes caring for this patient today including face to face time, ordering and reviewing labs, reviewing records from Dr. Lonni, PET, echo images personally reviewed, seeing the patient, documenting in the record, and arranging follow ups.     Follow up in 2 months  Morene Brownie, MD Advanced Heart Failure Mechanical Circulatory Support 05/22/24

## 2024-06-01 NOTE — Progress Notes (Signed)
 Office Visit Note  Patient: Stacey Huerta             Date of Birth: 03-25-1976           MRN: [id number]             PCP: Antonio Cyndee Jamee JONELLE, DO Referring: Antonio Cyndee Jamee JONELLE, * Visit Date: 06/15/2024   Subjective:  Follow-up    Discussed the use of AI scribe software for clinical note transcription with the patient, who gave verbal consent to proceed.  History of Present Illness   Stacey Huerta is a 48 y.o. female here for follow up for primary Sjogren's syndrome off any maintenance treatment since last visit.  She has a history of decreased left heart function noted on an echocardiogram in February. Since then, she has undergone a PET scan and a right heart catheterization. Her PET scan did not show any evidence of active inflammation in the heart.  Her blood pressure has been consistently low, with recent adjustments to her medication regimen. She was taking half a pill of losartan , which was recently increased to a full pill. Despite the low blood pressure, she states, 'I don't feel bad.' Her systolic blood pressure typically ranges from 102 to 110, with diastolic readings in the 50s, which is lower than her usual 60s to 70s.  No major changes in joint pain, swelling, or rashes since stopping hydroxychloroquine . She has experienced some night sweats and daily hot flashes, which she describes as 'not particularly fun,' but she is managing them.  She has a history of pericardial effusion, which was noted as a small amount of fluid around the heart.       Previous HPI 12/17/2023 Stacey Huerta is a 48 y.o. female here for follow up for primary Sjogren's syndrome after starting hydroxychloroquine  200 mg daily last visit.  She reports somewhat inconsistent adherence maybe for 5 days/week taking the medicine.  She continues to feel like the previous joint pain and swelling is doing better.  She is noticing clicking and popping in the knees but not particularly  painful no locking up or instability.  She has very dry skin especially on the lower legs but no erythematous rashes and this is only mildly itchy.  Improved with moisturizing lotion.  Still has dry eyes and dry mouth is not bad enough that she is really taking action to use any eyedrops consistently.  Has not had any major recurrence of hives.   Previous HPI 04/15/2023 Stacey Huerta is a 48 y.o. female here for follow up for primary Sjogren's syndrome after starting hydroxychloroquine  last visit.  She is only taking this 200 mg once daily and reports missing doses on some days because she has trouble remembering medicines as she does not usually take a lot.  Feels her joint pain and stiffness overall is noticeably improved.  Especially benefit with the hips and in her hands still has some knee crepitus and pain with use.  She has had some nausea type stomach irritation but not sure if it is related to the medication.  Has not seen appreciable difference with dry eyes and dry mouth.   Previous HPI 02/12/23 Stacey Huerta is a 48 y.o. female here for evaluation and management of sjogren syndrome. Previously saw Dr. Ziolkowska in 2016 associated with positive RNP and SSA Abs, raynauds and xerostomia.  Dryness symptoms have been pretty consistently present they recently eyes are doing okay is able to wear contacts.  Dental problems are new needing several teeth pulled which has not been the case for her in the past and without any other specific change to account for this. Joint pains are mostly from the hips down does not have chronic issues with her upper body.  Previously symptoms were doing well when taking fertility treatments including dexamethasone  and aspirin .  Was also previously on naltrexone  with the benefit for arthritis pain.  She has had intra-articular steroid injections for knee osteoarthritis most recently had viscosupplementation in the left knee last year.  She has never been on  any drug specifically for Sjogren syndrome associated arthritis. Gets rashes easily on sun exposed areas. Also notices easy bruising often without recalling any impact to the site.  No history of significant abnormal bleeding or blood clots. Discoloration in fingers with cold exposure but never associated with any lesions or skin peeling.    Review of Systems  Constitutional:  Positive for fatigue.  HENT:  Negative for mouth sores and mouth dryness.   Eyes:  Positive for dryness.  Respiratory:  Positive for shortness of breath.   Cardiovascular:  Negative for chest pain and palpitations.  Gastrointestinal:  Negative for blood in stool, constipation and diarrhea.  Endocrine: Negative for increased urination.  Genitourinary:  Negative for involuntary urination.  Musculoskeletal:  Negative for joint pain, gait problem, joint pain, joint swelling, myalgias, muscle weakness, morning stiffness, muscle tenderness and myalgias.  Skin:  Positive for color change and sensitivity to sunlight. Negative for rash and hair loss.  Allergic/Immunologic: Negative for susceptible to infections.  Neurological:  Positive for headaches. Negative for dizziness.  Hematological:  Negative for swollen glands.  Psychiatric/Behavioral:  Positive for depressed mood and sleep disturbance. The patient is not nervous/anxious.     PMFS History:  Patient Active Problem List   Diagnosis Date Noted   Allergy     Back pain    Dyspnea    Fibroid    Infertility, female    Leg edema    Osteoarthritis    Postpartum depression    Xerophthalmia 04/15/2023   Xerostomia 02/12/2023   Bilateral primary osteoarthritis of knee 02/12/2023   Seasonal affective disorder (HCC) 12/05/2022   Adjustment disorder with anxiety 12/05/2022   Major depressive disorder, recurrent episode, moderate (HCC) 11/21/2022   Preventative health care 11/01/2022   Depressive episode 11/01/2022   Lack of concentration 11/01/2022   Vitamin B12  deficiency 11/01/2022   Other fatigue 12/08/2019   Pansinusitis 12/23/2018   Influenza 12/23/2018   Other hyperlipidemia 10/08/2018   Insulin  resistance 10/08/2018   Vitamin D  deficiency 10/08/2018   Hyperglycemia 12/19/2015   Sjogren's syndrome (HCC) 10/28/2015   Dysphoric mood 12/14/2014   Preterm infant, birth weight 500-749 grams, with 24 completed weeks of gestation 03/21/2014   Preeclampsia 03/16/2014   Morbid obesity (HCC) 03/16/2014   Thrombocytopenia, unspecified (HCC) 03/16/2014   Urticaria due to food allergy  09/28/2012   CHOLECYSTECTOMY, LAPAROSCOPIC, HX OF 08/06/2008   OBESITY, UNSPECIFIED 04/06/2008    Past Medical History:  Diagnosis Date   Adjustment disorder with anxiety 12/05/2022   Allergy     Back pain    Bilateral primary osteoarthritis of knee 02/12/2023   Congestive heart failure (HCC) 09/2023   Depressive episode 11/01/2022   Dysphoric mood 12/14/2014   Dyspnea    Fibroid    Hyperglycemia 12/19/2015   Infertility, female    Influenza 12/23/2018   Insulin  resistance 10/08/2018   Lack of concentration 11/01/2022   Leg edema    Major  depressive disorder, recurrent episode, moderate (HCC) 11/21/2022   Morbid obesity (HCC) 03/16/2014   OBESITY, UNSPECIFIED 04/06/2008   Qualifier: Diagnosis of   By: Antonio ROSALEA Rockers         Osteoarthritis    Other fatigue 12/08/2019   Other hyperlipidemia 10/08/2018   Pansinusitis 12/23/2018   Postpartum depression    Preeclampsia 03/16/2014   Preventative health care 11/01/2022   Seasonal affective disorder (HCC) 12/05/2022   Sjogren's syndrome (HCC) 10/28/2015   Thrombocytopenia, unspecified (HCC) 03/16/2014   Urticaria due to food allergy  09/28/2012   Allergy  Profile 07/23/12- specific elevations for dust mite, dog dander, cockroach and some weed pollens.  Food IgG profile- elevated to all agents tested, as has been my experience with this test.   Allergy  Profile 09/15/12- Total IgE 536.8, specific elevations for  orange, chicken, apple, shrimp, tuna  ANA 09/15/12- Pos speckled 1:160  ESR 23        Vitamin B12 deficiency 11/01/2022   Vitamin D  deficiency    Xerophthalmia 04/15/2023   Xerostomia 02/12/2023    Family History  Problem Relation Age of Onset   Hypertension Paternal Grandfather    Diabetes Paternal Grandfather    Diabetes Father    Hypotension Father    Anemia Father    Colon cancer Paternal Grandmother    Diabetes Paternal Grandmother    Prostate cancer Maternal Grandfather    Hyperlipidemia Mother    Obesity Mother    Breast cancer Maternal Aunt    Arthritis Paternal Aunt    Hyperlipidemia Maternal Aunt        x's 2   Esophageal cancer Neg Hx    Stomach cancer Neg Hx    Past Surgical History:  Procedure Laterality Date   ABDOMINAL ADHESION SURGERY     BUNIONECTOMY Right 10/2020   CESAREAN SECTION N/A 03/18/2014   Procedure: CESAREAN SECTION;  Surgeon: Jon CINDERELLA Rummer, MD;  Location: [facility];  Service: Obstetrics;  Laterality: N/A;  spinal/epidural   CHOLECYSTECTOMY     ENDOMETRIAL ABLATION     LYMPHADENECTOMY     Removed from the neck at age 71   MYOMECTOMY     MYOMECTOMY  05/23/2012   Procedure: MYOMECTOMY;  Surgeon: Cynthia Loss, MD;  Location: [facility];  Service: Gynecology;  Laterality: N/A;   RIGHT HEART CATH N/A 06/12/2024   Procedure: RIGHT HEART CATH;  Surgeon: Zenaida Morene PARAS, MD;  Location: [facility] INVASIVE CV LAB;  Service: Cardiovascular;  Laterality: N/A;   Social History   Social History Narrative   Nurse, adult 3 days a week--- [employer]   Immunization History  Administered Date(s) Administered   Hepatitis A 04/30/2007, 10/29/2007   Hepatitis B 05/07/2007, 06/04/2007, 11/05/2007   Td 05/07/2007   Tdap 11/01/2020     Objective: Vital Signs: BP (!) 85/56 (BP Location: Left Arm, Patient Position: Sitting, Cuff Size: Normal)   Pulse 96   Resp 17   Ht 5' 10 (1.778 m)   Wt (!) 303 lb (137.4 kg)   LMP 05/16/2024   BMI 43.48  kg/m    Physical Exam Eyes:     Conjunctiva/sclera: Conjunctivae normal.  Cardiovascular:     Rate and Rhythm: Normal rate and regular rhythm.  Pulmonary:     Effort: Pulmonary effort is normal.     Breath sounds: Normal breath sounds.  Lymphadenopathy:     Cervical: No cervical adenopathy.  Skin:    General: Skin is warm and dry.  Neurological:     Mental  Status: She is alert.  Psychiatric:        Mood and Affect: Mood normal.      Musculoskeletal Exam:  Shoulders full ROM no tenderness or swelling Elbows full ROM no tenderness or swelling Wrists full ROM no tenderness or swelling Fingers full ROM no tenderness or swelling Hip normal internal and external rotation without pain, no tenderness to lateral hip palpation Knees full ROM no tenderness or swelling, bilateral patellofemoral crepitus   Investigation: No additional findings.  Imaging: CARDIAC CATHETERIZATION Result Date: 06/14/2024 HEMODYNAMICS: RA:       8 mmHg (mean) RV:       45/2, 8 mmHg PA:       45/22 mmHg (31 mean) PCWP: 22 mmHg (mean)    Estimated Fick CO/CI   5.82L/min, 2.33L/min/m2 Thermodilution CO/CI   4.73L/min, 1.89L/min/m2    TPG  9  mmHg     PVR  <2 Wood Units PAPi  2.875  IMPRESSION: Right heart catheterization for evaluation of low output heart failure symptoms Mildly elevated right, moderately elevated left sided filling pressures Moderate, post capillary group II PH Severely reduced cardiac output by TD RECOMMENDATIONS: Increase diuresis and losartan  prior to next weeks appointment Consider enrollment in cardiac rehab/GLP-1 If symptoms worsen would need to consider advanced therapies, LVAD at this time    Recent Labs: Lab Results  Component Value Date   WBC 5.0 06/12/2024   HGB 13.3 06/12/2024   HGB 13.3 06/12/2024   PLT 135 (L) 06/12/2024   NA 141 06/12/2024   NA 141 06/12/2024   K 3.5 06/12/2024   K 3.5 06/12/2024   CL 102 05/19/2024   CO2 27 05/19/2024   GLUCOSE 91 05/19/2024   BUN 10  05/19/2024   CREATININE 1.04 (H) 05/19/2024   BILITOT 1.2 03/17/2024   ALKPHOS 88 03/17/2024   AST 16 03/17/2024   ALT 15 03/17/2024   PROT 7.8 03/17/2024   ALBUMIN 4.1 03/17/2024   CALCIUM  9.1 05/19/2024   GFRAA 75 05/12/2020    Speciality Comments: PLQ Eye Exam: Patient states she has an eye exam done at the [facility] in [facility] and will have them fax us  the results.  Procedures:  No procedures performed Allergies: [place] nut (berthollefia excelsa), Egg-derived products, Metformin  and related, Shiitake mushroom (obsolete), Sulfonamide derivatives, [place], Mushroom, Mushroom extract complex (obsolete), Penicillins, Reishi (ganoderma lucidum) (obsolete), Shellfish allergy , Statins, and Fish-derived products   Assessment / Plan:     Visit Diagnoses: Sjogren's syndrome, with unspecified organ involvement (HCC) - Plan: Sedimentation rate, Anti-DNA antibody, double-stranded, C3 and C4 No significant changes in joint pain, swelling, or rashes after discontinuing hydroxychloroquine .  Persistent mouth and eye dryness without any complication such as vision change or recurrent oral nasal ulceration. - Recheck disease activity markers including double-stranded DNA antibody and sedimentation rate. - Continue symptomatic management for dryness  Heart failure with reduced ejection fraction and pericardial effusion Echocardiogram showed decreased left heart function. PET scan and right heart catheterization ruled out active inflammation or autoimmune involvement. No evidence of cardiac sarcoidosis or amyloidosis. Hydroxychloroquine  discontinued and was not on high dose and duration to suggest cardiomyopathy risk.  Hypotension Blood pressure is very low but asymptomatic. History of low blood pressure may contribute to tolerance. Current medication includes increased losartan . Diastolic pressure in the 50s, lower than usual 60s-70s, but remains asymptomatic.  Menopausal symptoms Increased  frequency of hot flashes and night sweats.        Orders: Orders Placed This Encounter  Procedures   Sedimentation rate   Anti-DNA antibody, double-stranded   C3 and C4   No orders of the defined types were placed in this encounter.    Follow-Up Instructions: Return in about 1 year (around 06/15/2025) for pSS obs f/u 9yr.   Lonni LELON Ester, MD  Note - This record has been created using [employer].  Chart creation errors have been sought, but may not always  have been located. Such creation errors do not reflect on  the standard of medical care.

## 2024-06-03 ENCOUNTER — Encounter (HOSPITAL_COMMUNITY): Payer: Self-pay | Admitting: Cardiology

## 2024-06-12 ENCOUNTER — Other Ambulatory Visit: Payer: Self-pay

## 2024-06-12 ENCOUNTER — Ambulatory Visit (HOSPITAL_COMMUNITY)
Admission: RE | Admit: 2024-06-12 | Discharge: 2024-06-12 | Disposition: A | Discharge status: Home / Self Care | Attending: Cardiology | Admitting: Cardiology

## 2024-06-12 ENCOUNTER — Encounter (HOSPITAL_COMMUNITY)
Admission: RE | Disposition: A | Discharge status: Home / Self Care | Payer: Self-pay | Source: Home / Self Care | Attending: Cardiology

## 2024-06-12 DIAGNOSIS — E669 Obesity, unspecified: Secondary | ICD-10-CM | POA: Diagnosis not present

## 2024-06-12 DIAGNOSIS — I5022 Chronic systolic (congestive) heart failure: Secondary | ICD-10-CM | POA: Diagnosis present

## 2024-06-12 DIAGNOSIS — I429 Cardiomyopathy, unspecified: Secondary | ICD-10-CM | POA: Diagnosis not present

## 2024-06-12 DIAGNOSIS — Z79899 Other long term (current) drug therapy: Secondary | ICD-10-CM | POA: Insufficient documentation

## 2024-06-12 DIAGNOSIS — Z6841 Body Mass Index (BMI) 40.0 and over, adult: Secondary | ICD-10-CM | POA: Diagnosis not present

## 2024-06-12 DIAGNOSIS — I509 Heart failure, unspecified: Secondary | ICD-10-CM | POA: Diagnosis not present

## 2024-06-12 HISTORY — PX: RIGHT HEART CATH: CATH118263

## 2024-06-12 LAB — POCT I-STAT 7, (LYTES, BLD GAS, ICA,H+H)
Acid-Base Excess: 2 mmol/L (ref 0.0–2.0)
Acid-Base Excess: 3 mmol/L — ABNORMAL HIGH (ref 0.0–2.0)
Bicarbonate: 27.2 mmol/L (ref 20.0–28.0)
Bicarbonate: 28.6 mmol/L — ABNORMAL HIGH (ref 20.0–28.0)
Calcium, Ion: 1.21 mmol/L (ref 1.15–1.40)
Calcium, Ion: 1.24 mmol/L (ref 1.15–1.40)
HCT: 39 % (ref 36.0–46.0)
HCT: 39 % (ref 36.0–46.0)
Hemoglobin: 13.3 g/dL (ref 12.0–15.0)
Hemoglobin: 13.3 g/dL (ref 12.0–15.0)
O2 Saturation: 65 %
O2 Saturation: 69 %
Potassium: 3.5 mmol/L (ref 3.5–5.1)
Potassium: 3.5 mmol/L (ref 3.5–5.1)
Sodium: 141 mmol/L (ref 135–145)
Sodium: 141 mmol/L (ref 135–145)
TCO2: 29 mmol/L (ref 22–32)
TCO2: 30 mmol/L (ref 22–32)
pCO2 arterial: 44.8 mmHg (ref 32–48)
pCO2 arterial: 44.9 mmHg (ref 32–48)
pH, Arterial: 7.391 (ref 7.35–7.45)
pH, Arterial: 7.414 (ref 7.35–7.45)
pO2, Arterial: 34 mmHg — CL (ref 83–108)
pO2, Arterial: 36 mmHg — CL (ref 83–108)

## 2024-06-12 LAB — CBC
HCT: 38.4 % (ref 36.0–46.0)
Hemoglobin: 13.1 g/dL (ref 12.0–15.0)
MCH: 32.8 pg (ref 26.0–34.0)
MCHC: 34.1 g/dL (ref 30.0–36.0)
MCV: 96 fL (ref 80.0–100.0)
Platelets: 135 K/uL — ABNORMAL LOW (ref 150–400)
RBC: 4 MIL/uL (ref 3.87–5.11)
RDW: 12.8 % (ref 11.5–15.5)
WBC: 5 K/uL (ref 4.0–10.5)
nRBC: 0 % (ref 0.0–0.2)

## 2024-06-12 LAB — PREGNANCY, URINE: Preg Test, Ur: NEGATIVE

## 2024-06-12 SURGERY — RIGHT HEART CATH
Anesthesia: LOCAL

## 2024-06-12 MED ORDER — LIDOCAINE HCL (PF) 1 % IJ SOLN
INTRAMUSCULAR | Status: DC | PRN
  Administered 2024-06-12: 2 mL via INTRADERMAL

## 2024-06-12 MED ORDER — LIDOCAINE HCL (PF) 1 % IJ SOLN
INTRAMUSCULAR | Status: AC
  Filled 2024-06-12: qty 30

## 2024-06-12 MED ORDER — LOSARTAN POTASSIUM 25 MG PO TABS: 25.0000 mg | ORAL_TABLET | Freq: Every day | ORAL | 3 refills | Status: AC

## 2024-06-12 MED ORDER — SODIUM CHLORIDE 0.9% FLUSH: 3.0000 mL | INTRAVENOUS | Status: DC | PRN

## 2024-06-12 MED ORDER — SODIUM CHLORIDE 0.9% FLUSH: 3.0000 mL | Freq: Two times a day (BID) | INTRAVENOUS | Status: DC

## 2024-06-12 MED ORDER — FREE WATER: 250.0000 mL | Freq: Once | Status: DC

## 2024-06-12 MED ORDER — SODIUM CHLORIDE 0.9 % IV SOLN: 250.0000 mL | INTRAVENOUS | Status: DC | PRN

## 2024-06-12 MED ORDER — HEPARIN (PORCINE) IN NACL 1000-0.9 UT/500ML-% IV SOLN
INTRAVENOUS | Status: DC | PRN
  Administered 2024-06-12 (×2): 500 mL

## 2024-06-12 MED ORDER — FUROSEMIDE 40 MG PO TABS: 80.0000 mg | ORAL_TABLET | Freq: Every day | ORAL | 11 refills | Status: AC

## 2024-06-12 MED ORDER — LOSARTAN POTASSIUM 25 MG PO TABS: 25.0000 mg | ORAL_TABLET | Freq: Every day | ORAL | 3 refills | Status: DC

## 2024-06-12 SURGICAL SUPPLY — 7 items
CATH SWAN GANZ 7F STRAIGHT (CATHETERS) IMPLANT
GLIDESHEATH SLENDER 7FR .021G (SHEATH) IMPLANT
GUIDEWIRE .025 260CM (WIRE) IMPLANT
KIT RIGHT HEART ACIST (MISCELLANEOUS) IMPLANT
PACK CARDIAC CATHETERIZATION (CUSTOM PROCEDURE TRAY) ×1 IMPLANT
TRANSDUCER W/STOPCOCK (MISCELLANEOUS) IMPLANT
TUBING ART PRESS 72 MALE/FEM (TUBING) IMPLANT

## 2024-06-12 NOTE — Interval H&P Note (Signed)
 History and Physical Interval Note:  06/12/2024 8:05 AM  Stacey Huerta  has presented today for surgery, with the diagnosis of chf.  The various methods of treatment have been discussed with the patient and family. After consideration of risks, benefits and other options for treatment, the patient has consented to  Procedure(s): RIGHT HEART CATH (N/A) as a surgical intervention.  The patient's history has been reviewed, patient examined, no change in status, stable for surgery.  I have reviewed the patient's chart and labs.  Questions were answered to the patient's satisfaction.     [name]

## 2024-06-12 NOTE — Discharge Instructions (Signed)

## 2024-06-12 NOTE — Progress Notes (Signed)
 PT was able to tolerate fluids, discharge instructions reviewed with pt and significant other at bedside denies questions or concerns at this time. PT was escorted off the unit via wheelchair to personal vehicle.

## 2024-06-14 ENCOUNTER — Encounter (HOSPITAL_COMMUNITY): Payer: Self-pay | Admitting: Cardiology

## 2024-06-15 ENCOUNTER — Encounter: Payer: Self-pay | Admitting: Internal Medicine

## 2024-06-15 ENCOUNTER — Ambulatory Visit: Payer: 59 | Attending: Internal Medicine | Admitting: Internal Medicine

## 2024-06-15 VITALS — BP 85/56 | HR 96 | Resp 17 | Ht 70.0 in | Wt 303.0 lb

## 2024-06-15 DIAGNOSIS — M3500 Sicca syndrome, unspecified: Secondary | ICD-10-CM

## 2024-06-15 DIAGNOSIS — K117 Disturbances of salivary secretion: Secondary | ICD-10-CM | POA: Diagnosis not present

## 2024-06-15 DIAGNOSIS — Z79899 Other long term (current) drug therapy: Secondary | ICD-10-CM | POA: Diagnosis not present

## 2024-06-15 DIAGNOSIS — M170 Bilateral primary osteoarthritis of knee: Secondary | ICD-10-CM

## 2024-06-16 LAB — C3 AND C4
C3 Complement: 152 mg/dL (ref 83–193)
C4 Complement: 23 mg/dL (ref 15–57)

## 2024-06-16 LAB — ANTI-DNA ANTIBODY, DOUBLE-STRANDED: ds DNA Ab: 9 [IU]/mL — ABNORMAL HIGH

## 2024-06-16 LAB — SEDIMENTATION RATE: Sed Rate: 39 mm/h — ABNORMAL HIGH (ref 0–20)

## 2024-06-17 ENCOUNTER — Ambulatory Visit (HOSPITAL_COMMUNITY)
Admission: RE | Admit: 2024-06-17 | Discharge: 2024-06-17 | Disposition: A | Discharge status: Home / Self Care | Source: Ambulatory Visit | Attending: Cardiology | Admitting: Cardiology

## 2024-06-17 ENCOUNTER — Encounter (HOSPITAL_COMMUNITY): Payer: Self-pay

## 2024-06-17 ENCOUNTER — Encounter (HOSPITAL_COMMUNITY): Payer: Self-pay | Admitting: Cardiology

## 2024-06-17 VITALS — BP 110/70 | HR 96 | Wt 305.0 lb

## 2024-06-17 DIAGNOSIS — Z79899 Other long term (current) drug therapy: Secondary | ICD-10-CM | POA: Diagnosis not present

## 2024-06-17 DIAGNOSIS — I952 Hypotension due to drugs: Secondary | ICD-10-CM

## 2024-06-17 DIAGNOSIS — E669 Obesity, unspecified: Secondary | ICD-10-CM | POA: Insufficient documentation

## 2024-06-17 DIAGNOSIS — I5022 Chronic systolic (congestive) heart failure: Secondary | ICD-10-CM | POA: Diagnosis present

## 2024-06-17 NOTE — Patient Instructions (Signed)
 There has been no changes to your medications.  You have been referred to cardiac rehab. They will call you to arrange your appointment.  Your physician recommends that you schedule a follow-up appointment in: 2 months ( October) ** PLEASE CALL THE OFFICE IN 3 WEEKS TO ARRANGE YOUR FOLLOW UP APPOINTMENT.**  If you have any questions or concerns before your next appointment please send us  a message through [place] or call our office at [phone].    TO LEAVE A MESSAGE FOR THE NURSE SELECT OPTION 2, PLEASE LEAVE A MESSAGE INCLUDING: YOUR NAME DATE OF BIRTH CALL BACK NUMBER REASON FOR CALL**this is important as we prioritize the call backs  YOU WILL RECEIVE A CALL BACK THE SAME DAY AS LONG AS YOU CALL BEFORE 4:00 PM  At the [facility], you and your health needs are our priority. As part of our continuing mission to provide you with exceptional heart care, we have created designated Provider Care Teams. These Care Teams include your primary Cardiologist (physician) and Advanced Practice Providers (APPs- Physician Assistants and Nurse Practitioners) who all work together to provide you with the care you need, when you need it.   You may see any of the following providers on your designated Care Team at your next follow up: Dr Toribio Fuel Dr Ezra Shuck Dr. Ria Commander Dr. Morene Brownie Amy Lenetta, NP Caffie Shed, [place] [facility] [place], [place] Beckey Coe, NP [place] Lee, NP Ellouise Class, NP Tinnie Redman, PharmD Jaun Bash, PharmD   Please be sure to bring in all your medications bottles to every appointment.    Thank you for choosing [facility]

## 2024-06-18 ENCOUNTER — Telehealth (HOSPITAL_COMMUNITY): Payer: Self-pay

## 2024-06-18 NOTE — Telephone Encounter (Signed)
 Pt insurance is active and benefits verified through [facility] Co-pay 0, DED $1,700/$1,700 met, out of pocket $3,400/$3,400 met, co-insurance 10%. no pre-authorization required, [place]/[employer] 06/18/2024@12 :17, REF# [id number]   TCR/ICR? ICR Visit(date of service)limitation? No limit Can multiple codes be used on the same date of service/visit?(IF ITS A LIMIT) n/a    Is this a lifetime maximum or an annual maximum? annual Has the member used any of these services to date? no Is there a time limit (weeks/months) on start of program and/or program completion? no

## 2024-06-19 ENCOUNTER — Telehealth: Payer: Self-pay | Admitting: Family Medicine

## 2024-06-19 NOTE — Telephone Encounter (Signed)
 Pt dropped off a form to be by pcp. Pt asks to be called to pick up when ready

## 2024-06-22 NOTE — Progress Notes (Signed)
 ADVANCED HE[facility] FAILURE FOLLOW UP CLINIC NOTE  Referring Physician: Antonio Cyndee Jamee JONELLE, *  Primary Care: Antonio Cyndee Jamee JONELLE, DO Primary Cardiologist:  HPI: Stacey Huerta is a 48 y.o. female who presents for follow up of chronic systolic heart failure.      Established with Dr. Lonni 11/20/2023. She noted frequent syncopal spells in her 29s with cardiology workup including EKG/orthostatics unremarkable but unclear whether she had echocardiogram. In her 30s and 40s no recurrent syncope but did have hypotension. At age 85 had severe preeclampsia/HELLP syndrome in 6 weeks after delivery her blood pressure normalized.    Prior echocardiogram 2014 due to syncope LVEF 45-50%.    Echocardiogram 12/17/2023 LVEF less than 20%, LV global hypokinesis, LV moderate to severely dilated, RV SF moderately reduced, normal PASP, bilateral atria moderately dilated, moderate to severe MR. Based on results, Losartan  12.5mg  daily was initiated. She reported using Lasix  3 x per week when she felt profoundly bloated. Medication titration limited by relative hypotension.      SUBJECTIVE:  Overall doing fair, no issues after her catheterization.  Has had some difficulty taking the losartan  all at once in the morning.  We discussed that she could split the pill or take the 25 mg at night.  Otherwise she has continued shortness of breath with exertion.  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  She enjoys being active and after discussion would like to try cardiac rehab with the hope to get back in with her personal trainer afterwards.  We discussed exercise restrictions, of which they are relatively minimal and the plan for continued slow titration of medical therapy with hopeful weight loss and rehab to avoid progressing to advanced therapies.  PMH, current medications, allergies, social history, and family history reviewed in epic.  PHYSICAL  EXAM: Vitals:   06/17/24 1025  BP: 110/70  Pulse: 96  SpO2: 97%   GENERAL: Well nourished and in no apparent distress at rest.  PULM:  Normal work of breathing, clear to auscultation bilaterally. Respirations are unlabored.  CARDIAC:  JVP: flat         Normal rate with regular rhythm. No murmurs, rubs or gallops.  Trace edema. Warm and well perfused extremities. ABDOMEN: Soft, non-tender, non-distended. NEUROLOGIC: Patient is oriented x3 with no focal or lateralizing neurologic deficits.    DATA REVIEW  ECG: 05/19/2024: Sinus rhythm with significantly prolonged first-degree AV block   ECHO: 11/2023: LVEF less than 20%, moderate to severe dilation, RV moderately reduced   CATH: RHC 05/2024: RA 8, PA 45/22 (31), PCWP 22, TD CO/CI 4.73/1.89   PET: 05/05/2024: Normal LV perfusion, rest ejection fraction 13%, abnormal myocardial blood flow  ASSESSMENT & PLAN:  Chronic systolic heart failure: Unclear etiology, recent cardiac PET scan shows no evidence of ischemia. Cardiac MRI ordered and pending, recent RHC with low output and has relative inability to tolerate GDMT. Stable NYHA Class III symptoms, long discussion today about advanced therapies. Will plan to pursue cardiac rehab and slow increase in medical therapy at this time. Low threshold to transition to LVAD clinic visit if she worsens.    - CMR ordered to evaluate for infiltrative disease and scar burden - Continue dapagliflozin  10 mg daily -Continue metoprolol  succinate 12.5 mg daily -Recent increase in losartan  to 25 mg daily, would recommend start taking at night -Spironolactone at next visit -Cardiac rehab -Continue Lasix  80 mg daily for now, decrease to  40 at next visit with MRA -Labs for next visit   Obesity: Longstanding, wegovy  was denied. - Cardiac rehab ordered - Would greatly benefit from GLP-1  Follow up in 1-2 months  Morene Brownie, MD Advanced [facility] Mechanical Circulatory Support 06/22/24

## 2024-06-25 ENCOUNTER — Telehealth (HOSPITAL_COMMUNITY): Payer: Self-pay

## 2024-06-25 NOTE — Telephone Encounter (Signed)
 Attempted to call patient to schedule cardiac rehab- no answer, left message. Sent MyChart message.

## 2024-06-25 NOTE — Telephone Encounter (Signed)
 Patient called back to get scheduled in the Cardiac Rehab Program. Patient will come in for orientation on 9/23 and will attend the 1:45 exercise class.  Sent MyChart message.

## 2024-06-26 NOTE — Telephone Encounter (Signed)
 Form completed and placed at the front. Pt made aware.

## 2024-07-13 ENCOUNTER — Telehealth (HOSPITAL_COMMUNITY): Payer: Self-pay

## 2024-07-13 NOTE — Telephone Encounter (Signed)
 Called to confirm pt appt for tomorrow, appt is confirmed.

## 2024-07-14 ENCOUNTER — Encounter (HOSPITAL_COMMUNITY)
Admission: RE | Admit: 2024-07-14 | Discharge: 2024-07-14 | Disposition: A | Discharge status: Home / Self Care | Source: Ambulatory Visit | Attending: Cardiology | Admitting: Cardiology

## 2024-07-14 VITALS — BP 94/60 | HR 84 | Ht 70.0 in | Wt 304.0 lb

## 2024-07-14 DIAGNOSIS — I5022 Chronic systolic (congestive) heart failure: Secondary | ICD-10-CM | POA: Insufficient documentation

## 2024-07-14 NOTE — Progress Notes (Signed)
 Cardiac Individual Treatment Plan  Patient Details  Name: Stacey Huerta MRN: [id number] Date of Birth: 1976/05/20 Referring Provider:   Flowsheet Row INTENSIVE CARDIAC REHAB ORIENT from 07/14/2024 in [facility]  Referring Provider Zenaida Morene PARAS, MD    Initial Encounter Date:  Flowsheet Row INTENSIVE CARDIAC REHAB ORIENT from 07/14/2024 in [facility]  Date 07/14/24    Visit Diagnosis: Heart failure, chronic systolic (HCC)  Patient's Home Medications on Admission:  Current Outpatient Medications:    aspirin  EC 81 MG tablet, Take 1 tablet (81 mg total) by mouth daily., Disp: , Rfl:    dapagliflozin  propanediol (FARXIGA ) 10 MG TABS tablet, Take 1 tablet (10 mg total) by mouth daily before breakfast., Disp: 30 tablet, Rfl: 5   furosemide  (LASIX ) 40 MG tablet, Take 2 tablets (80 mg total) by mouth daily., Disp: 60 tablet, Rfl: 11   losartan  (COZAAR ) 25 MG tablet, Take 1 tablet (25 mg total) by mouth daily. (Patient taking differently: Take 12.5 mg by mouth 2 (two) times daily. 1/2 tab twice a day), Disp: 90 tablet, Rfl: 3   MAGNESIUM  PO, Take 1 tablet by mouth 2 (two) times a week., Disp: , Rfl:    metoprolol  succinate (TOPROL -XL) 25 MG 24 hr tablet, Take 12.5 mg by mouth daily., Disp: , Rfl:    Omega-3 Fatty Acids (FISH OIL) 1000 MG CAPS, Take 1,000 mg by mouth 2 (two) times a week., Disp: , Rfl:    Probiotic Product (PROBIOTIC-10 PO), Take 1 tablet by mouth 2 (two) times a week., Disp: , Rfl:    UBIQUINONE PO, Take 100 mg by mouth 2 (two) times a week., Disp: , Rfl:    VITAMIN D  PO, Take 1 tablet by mouth 3 (three) times a week., Disp: , Rfl:    VITAMIN E PO, Take 1 tablet by mouth 3 (three) times a week., Disp: , Rfl:    zinc gluconate 50 MG tablet, Take 50 mg by mouth 2 (two) times a week., Disp: , Rfl:   Past Medical History: Past Medical History:   Diagnosis Date   Adjustment disorder with anxiety 12/05/2022   Allergy     Back pain    Bilateral primary osteoarthritis of knee 02/12/2023   Congestive heart failure (HCC) 09/2023   Depressive episode 11/01/2022   Dysphoric mood 12/14/2014   Dyspnea    Fibroid    Hyperglycemia 12/19/2015   Infertility, female    Influenza 12/23/2018   Insulin  resistance 10/08/2018   Lack of concentration 11/01/2022   Leg edema    Major depressive disorder, recurrent episode, moderate (HCC) 11/21/2022   Morbid obesity (HCC) 03/16/2014   OBESITY, UNSPECIFIED 04/06/2008   Qualifier: Diagnosis of   By: Antonio ROSALEA Rockers         Osteoarthritis    Other fatigue 12/08/2019   Other hyperlipidemia 10/08/2018   Pansinusitis 12/23/2018   Postpartum depression    Preeclampsia 03/16/2014   Preventative health care 11/01/2022   Seasonal affective disorder 12/05/2022   Sjogren's syndrome 10/28/2015   Thrombocytopenia, unspecified 03/16/2014   Urticaria due to food allergy  09/28/2012   Allergy  Profile 07/23/12- specific elevations for dust mite, dog dander, cockroach and some weed pollens.  Food IgG profile- elevated to all agents tested, as has been my experience with this test.   Allergy  Profile 09/15/12- Total IgE 536.8, specific elevations for orange, chicken, apple, shrimp, tuna  ANA 09/15/12-  Pos speckled 1:160  ESR 23        Vitamin B12 deficiency 11/01/2022   Vitamin D  deficiency    Xerophthalmia 04/15/2023   Xerostomia 02/12/2023    Tobacco Use: Social History   Tobacco Use  Smoking Status Never   Passive exposure: Current  Smokeless Tobacco Never    Labs: Review Flowsheet  More data exists      Latest Ref Rng & Units 05/12/2020 11/01/2022 12/23/2023 03/17/2024 06/12/2024  Labs for ITP Cardiac and Pulmonary Rehab  Cholestrol 0 - 200 mg/dL 847  813  861  877  -  LDL (calc) 0 - 99 mg/dL 92  883  84  65  -  HDL-C >39.00 mg/dL 43  60.99  37  62.89  -  Trlycerides 0.0 - 149.0 mg/dL 90  844.9  89   03.9  -  Hemoglobin A1c 4.6 - 6.5 % 5.5  5.2  - 5.8  -  PH, Arterial 7.35 - 7.45 7.35 - 7.45 - - - - 7.391  7.414   PCO2 arterial 32 - 48 mmHg 32 - 48 mmHg - - - - 44.9  44.8   Bicarbonate 20.0 - 28.0 mmol/L 20.0 - 28.0 mmol/L - - - - 27.2  28.6   TCO2 22 - 32 mmol/L 22 - 32 mmol/L - - - - 29  30   O2 Saturation % % - - - - 65  69     Details       Multiple values from one day are sorted in reverse-chronological order         Capillary Blood Glucose: No results found for: GLUCAP   Exercise Target Goals: Exercise Program Goal: Individual exercise prescription set using results from initial 6 min walk test and THRR while considering  patient's activity barriers and safety.   Exercise Prescription Goal: Initial exercise prescription builds to 30-45 minutes a day of aerobic activity, 2-3 days per week.  Home exercise guidelines will be given to patient during program as part of exercise prescription that the participant will acknowledge.  Activity Barriers & Risk Stratification:  Activity Barriers & Cardiac Risk Stratification - 07/14/24 1334       Activity Barriers & Cardiac Risk Stratification   Activity Barriers Decreased Ventricular Function;Shortness of Breath;Other (comment)    Comments Right foot surgery in 2022, currently in PT.    Cardiac Risk Stratification High          6 Minute Walk:  6 Minute Walk     Row Name 07/14/24 1454         6 Minute Walk   Phase Initial     Distance 1068 feet     Walk Time 6 minutes     # of Rest Breaks 2     MPH 2.02     METS 2.71     RPE 15     Perceived Dyspnea  2.5     VO2 Peak 9.49     Symptoms Yes (comment)     Comments Shortness of breath, mild with difficulty.     Resting HR 84 bpm     Resting BP 94/60     Resting Oxygen Saturation  99 %     Exercise Oxygen Saturation  during 6 min walk 100 %     Max Ex. HR 133 bpm     Max Ex. BP 108/70     2 Minute Post BP 100/68        Oxygen  Initial  Assessment:   Oxygen Re-Evaluation:   Oxygen Discharge (Final Oxygen Re-Evaluation):   Initial Exercise Prescription:  Initial Exercise Prescription - 07/14/24 1500       Date of Initial Exercise RX and Referring Provider   Date 07/14/24    Referring Provider Zenaida Morene PARAS, MD    Expected Discharge Date 10/07/24      Recumbant Bike   Level 1    RPM 25    Watts 15    Minutes 15    METs 1.6      NuStep   Level 1    SPM 85    Minutes 15    METs 2      Prescription Details   Frequency (times per week) 3    Duration Progress to 30 minutes of continuous aerobic without signs/symptoms of physical distress      Intensity   THRR 40-80% of Max Heartrate 68-136    Ratings of Perceived Exertion 11-13    Perceived Dyspnea 0-4      Progression   Progression Continue to progress workloads to maintain intensity without signs/symptoms of physical distress.      Resistance Training   Training Prescription Yes    Weight 5 lbs    Reps 10-15          Perform Capillary Blood Glucose checks as needed.  Exercise Prescription Changes:   Exercise Comments:   Exercise Goals and Review:   Exercise Goals     Row Name 07/14/24 1325             Exercise Goals   Increase Physical Activity Yes       Intervention Provide advice, education, support and counseling about physical activity/exercise needs.;Develop an individualized exercise prescription for aerobic and resistive training based on initial evaluation findings, risk stratification, comorbidities and participant's personal goals.       Expected Outcomes Short Term: Attend rehab on a regular basis to increase amount of physical activity.;Long Term: Exercising regularly at least 3-5 days a week.;Long Term: Add in home exercise to make exercise part of routine and to increase amount of physical activity.       Increase Strength and Stamina Yes       Intervention Provide advice, education, support and counseling  about physical activity/exercise needs.;Develop an individualized exercise prescription for aerobic and resistive training based on initial evaluation findings, risk stratification, comorbidities and participant's personal goals.       Expected Outcomes Short Term: Increase workloads from initial exercise prescription for resistance, speed, and METs.;Short Term: Perform resistance training exercises routinely during rehab and add in resistance training at home;Long Term: Improve cardiorespiratory fitness, muscular endurance and strength as measured by increased METs and functional capacity ( )       Able to understand and use rate of perceived exertion (RPE) scale Yes       Intervention Provide education and explanation on how to use RPE scale       Expected Outcomes Short Term: Able to use RPE daily in rehab to express subjective intensity level;Long Term:  Able to use RPE to guide intensity level when exercising independently       Knowledge and understanding of Target Heart Rate Range (THRR) Yes       Intervention Provide education and explanation of THRR including how the numbers were predicted and where they are located for reference       Expected Outcomes Short Term: Able to state/look up THRR;Long Term: Able to  use THRR to govern intensity when exercising independently;Short Term: Able to use daily as guideline for intensity in rehab       Understanding of Exercise Prescription Yes       Intervention Provide education, explanation, and written materials on patient's individual exercise prescription       Expected Outcomes Short Term: Able to explain program exercise prescription;Long Term: Able to explain home exercise prescription to exercise independently          Exercise Goals Re-Evaluation :   Discharge Exercise Prescription (Final Exercise Prescription Changes):   Nutrition:  Target Goals: Understanding of nutrition guidelines, daily intake of sodium 1500mg , cholesterol 200mg ,  calories 30% from fat and 7% or less from saturated fats, daily to have 5 or more servings of fruits and vegetables.  Biometrics:  Pre Biometrics - 07/14/24 1254       Pre Biometrics   Waist Circumference 53 inches    Hip Circumference 60 inches    Waist to Hip Ratio 0.88 %    Triceps Skinfold 48 mm    % Body Fat 54.2 %    Grip Strength 33 kg    Flexibility 17.88 in    Single Leg Stand 30 seconds           Nutrition Therapy Plan and Nutrition Goals:   Nutrition Assessments:  MEDIFICTS Score Key: >=70 Need to make dietary changes  40-70 Heart Healthy Diet <= 40 Therapeutic Level Cholesterol Diet    Picture Your Plate Scores: <59 Unhealthy dietary pattern with much room for improvement. 41-50 Dietary pattern unlikely to meet recommendations for good health and room for improvement. 51-60 More healthful dietary pattern, with some room for improvement.  >60 Healthy dietary pattern, although there may be some specific behaviors that could be improved.    Nutrition Goals Re-Evaluation:   Nutrition Goals Re-Evaluation:   Nutrition Goals Discharge (Final Nutrition Goals Re-Evaluation):   Psychosocial: Target Goals: Acknowledge presence or absence of significant depression and/or stress, maximize coping skills, provide positive support system. Participant is able to verbalize types and ability to use techniques and skills needed for reducing stress and depression.  Initial Review & Psychosocial Screening:  Initial Psych Review & Screening - 07/14/24 1329       Initial Review   Current issues with Current Sleep Concerns      Family Dynamics   Good Support System? Yes    Comments Raley has excellent support from her mother, boyfriend, best friend, and Systems analyst.      Barriers   Psychosocial barriers to participate in program There are no identifiable barriers or psychosocial needs.      Screening Interventions   Interventions Encouraged to  exercise;Provide feedback about the scores to participant    Expected Outcomes Short Term goal: Identification and review with participant of any Quality of Life or Depression concerns found by scoring the questionnaire.;Long Term goal: The participant improves quality of Life and PHQ9 Scores as seen by post scores and/or verbalization of changes          Quality of Life Scores:  Quality of Life - 07/14/24 1328       Quality of Life   Select Quality of Life      Quality of Life Scores   Health/Function Pre 17.79 %    Socioeconomic Pre 23.17 %    Psych/Spiritual Pre 20.58 %    Family Pre 20 %    GLOBAL Pre 19.72 %  Scores of 19 and below usually indicate a poorer quality of life in these areas.  A difference of  2-3 points is a clinically meaningful difference.  A difference of 2-3 points in the total score of the Quality of Life Index has been associated with significant improvement in overall quality of life, self-image, physical symptoms, and general health in studies assessing change in quality of life.  PHQ-9: Review Flowsheet  More data exists      07/14/2024 03/17/2024 01/07/2023 11/01/2022 11/01/2020  Depression screen PHQ 2/9  Decreased Interest 1 0  3 1  Down, Depressed, Hopeless 0 1  3 1   PHQ - 2 Score 1 1  6 2   Altered sleeping 2 -  3 -  Tired, decreased energy 3 -  1 -  Change in appetite 0 -  3 -  Feeling bad or failure about yourself  0 -  - -  Trouble concentrating 1 -  0 -  Moving slowly or fidgety/restless 0 -  0 -  Suicidal thoughts 0 -  - -  PHQ-9 Score 7 -  13 -  Difficult doing work/chores Somewhat difficult - - Extremely dIfficult -    Details       Information is confidential and restricted. Go to Review Flowsheets to unlock data.        Interpretation of Total Score  Total Score Depression Severity:  1-4 = Minimal depression, 5-9 = Mild depression, 10-14 = Moderate depression, 15-19 = Moderately severe depression, 20-27 = Severe  depression   Psychosocial Evaluation and Intervention:   Psychosocial Re-Evaluation:   Psychosocial Discharge (Final Psychosocial Re-Evaluation):   Vocational Rehabilitation: Provide vocational rehab assistance to qualifying candidates.   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 No vocational rehab needs.          Education: Education Goals: Education classes will be provided on a weekly basis, covering required topics. Participant will state understanding/return demonstration of topics presented.     Core Videos: Exercise    Move It!  Clinical staff conducted group or individual video education with verbal and written material and guidebook.  Patient learns the recommended Pritikin exercise program. Exercise with the goal of living a long, healthy life. Some of the health benefits of exercise include controlled diabetes, healthier blood pressure levels, improved cholesterol levels, improved heart and lung capacity, improved sleep, and better body composition. Everyone should speak with their doctor before starting or changing an exercise routine.  Biomechanical Limitations Clinical staff conducted group or individual video education with verbal and written material and guidebook.  Patient learns how biomechanical limitations can impact exercise and how we can mitigate and possibly overcome limitations to have an impactful and balanced exercise routine.  Body Composition Clinical staff conducted group or individual video education with verbal and written material and guidebook.  Patient learns that body composition (ratio of muscle mass to fat mass) is a key component to assessing overall fitness, rather than body weight alone. Increased fat  mass, especially visceral belly fat, can put us  at increased risk for metabolic syndrome, type 2 diabetes, heart disease, and even death. It is recommended to combine diet and exercise (cardiovascular and resistance training) to improve your body composition. Seek guidance from  your physician and exercise physiologist before implementing an exercise routine.  Exercise Action Plan Clinical staff conducted group or individual video education with verbal and written material and guidebook.  Patient learns the recommended strategies to achieve and enjoy long-term exercise adherence, including variety, self-motivation, self-efficacy, and positive decision making. Benefits of exercise include fitness, good health, weight management, more energy, better sleep, less stress, and overall well-being.  Medical   Heart Disease Risk Reduction Clinical staff conducted group or individual video education with verbal and written material and guidebook.  Patient learns our heart is our most vital organ as it circulates oxygen, nutrients, white blood cells, and hormones throughout the entire body, and carries waste away. Data supports a plant-based eating plan like the Pritikin Program for its effectiveness in slowing progression of and reversing heart disease. The video provides a number of recommendations to address heart disease.   Metabolic Syndrome and Belly Fat  Clinical staff conducted group or individual video education with verbal and written material and guidebook.  Patient learns what metabolic syndrome is, how it leads to heart disease, and how one can reverse it and keep it from coming back. You have metabolic syndrome if you have 3 of the following 5 criteria: abdominal obesity, high blood pressure, high triglycerides, low HDL cholesterol, and high blood sugar.  Hypertension and Heart Disease Clinical staff conducted group or individual video education with verbal and written material and guidebook.   Patient learns that high blood pressure, or hypertension, is very common in the [place] . Hypertension is largely due to excessive salt intake, but other important risk factors include being overweight, physical inactivity, drinking too much alcohol, smoking, and not eating enough potassium from fruits and vegetables. High blood pressure is a leading risk factor for heart attack, stroke, congestive heart failure, dementia, kidney failure, and premature death. Long-term effects of excessive salt intake include stiffening of the arteries and thickening of heart muscle and organ damage. Recommendations include ways to reduce hypertension and the risk of heart disease.  Diseases of Our Time - Focusing on Diabetes Clinical staff conducted group or individual video education with verbal and written material and guidebook.  Patient learns why the best way to stop diseases of our time is prevention, through food and other lifestyle changes. Medicine (such as prescription pills and surgeries) is often only a Band-Aid on the problem, not a long-term solution. Most common diseases of our time include obesity, type 2 diabetes, hypertension, heart disease, and cancer. The Pritikin Program is recommended and has been proven to help reduce, reverse, and/or prevent the damaging effects of metabolic syndrome.  Nutrition   Overview of the Pritikin Eating Plan  Clinical staff conducted group or individual video education with verbal and written material and guidebook.  Patient learns about the Pritikin Eating Plan for disease risk reduction. The Pritikin Eating Plan emphasizes a wide variety of unrefined, minimally-processed carbohydrates, like fruits, vegetables, whole grains, and legumes. Go, Caution, and Stop food choices are explained. Plant-based and lean animal proteins are emphasized. Rationale provided for low sodium intake for blood pressure control, low added sugars for blood sugar stabilization, and low  added fats and oils for coronary artery disease risk reduction and weight management.  Calorie Density  Clinical staff conducted group or individual video education with verbal and written material and guidebook.  Patient learns about calorie density and how it impacts the Pritikin Eating Plan. Knowing the characteristics of the food you choose will help you decide whether those  foods will lead to weight gain or weight loss, and whether you want to consume more or less of them. Weight loss is usually a side effect of the Pritikin Eating Plan because of its focus on low calorie-dense foods.  Label Reading  Clinical staff conducted group or individual video education with verbal and written material and guidebook.  Patient learns about the Pritikin recommended label reading guidelines and corresponding recommendations regarding calorie density, added sugars, sodium content, and whole grains.  Dining Out - Part 1  Clinical staff conducted group or individual video education with verbal and written material and guidebook.  Patient learns that restaurant meals can be sabotaging because they can be so high in calories, fat, sodium, and/or sugar. Patient learns recommended strategies on how to positively address this and avoid unhealthy pitfalls.  Facts on Fats  Clinical staff conducted group or individual video education with verbal and written material and guidebook.  Patient learns that lifestyle modifications can be just as effective, if not more so, as many medications for lowering your risk of heart disease. A Pritikin lifestyle can help to reduce your risk of inflammation and atherosclerosis (cholesterol build-up, or plaque, in the artery walls). Lifestyle interventions such as dietary choices and physical activity address the cause of atherosclerosis. A review of the types of fats and their impact on blood cholesterol levels, along with dietary recommendations to reduce fat intake is also  included.  Nutrition Action Plan  Clinical staff conducted group or individual video education with verbal and written material and guidebook.  Patient learns how to incorporate Pritikin recommendations into their lifestyle. Recommendations include planning and keeping personal health goals in mind as an important part of their success.  Healthy Mind-Set    Healthy Minds, Bodies, Hearts  Clinical staff conducted group or individual video education with verbal and written material and guidebook.  Patient learns how to identify when they are stressed. Video will discuss the impact of that stress, as well as the many benefits of stress management. Patient will also be introduced to stress management techniques. The way we think, act, and feel has an impact on our hearts.  How Our Thoughts Can Heal Our Hearts  Clinical staff conducted group or individual video education with verbal and written material and guidebook.  Patient learns that negative thoughts can cause depression and anxiety. This can result in negative lifestyle behavior and serious health problems. Cognitive behavioral therapy is an effective method to help control our thoughts in order to change and improve our emotional outlook.  Additional Videos:  Exercise    Improving Performance  Clinical staff conducted group or individual video education with verbal and written material and guidebook.  Patient learns to use a non-linear approach by alternating intensity levels and lengths of time spent exercising to help burn more calories and lose more body fat. Cardiovascular exercise helps improve heart health, metabolism, hormonal balance, blood sugar control, and recovery from fatigue. Resistance training improves strength, endurance, balance, coordination, reaction time, metabolism, and muscle mass. Flexibility exercise improves circulation, posture, and balance. Seek guidance from your physician and exercise physiologist before  implementing an exercise routine and learn your capabilities and proper form for all exercise.  Introduction to Yoga  Clinical staff conducted group or individual video education with verbal and written material and guidebook.  Patient learns about yoga, a discipline of the coming together of mind, breath, and body. The benefits of yoga include improved flexibility, improved range of motion, better posture and core  strength, increased lung function, weight loss, and positive self-image. Yoga's heart health benefits include lowered blood pressure, healthier heart rate, decreased cholesterol and triglyceride levels, improved immune function, and reduced stress. Seek guidance from your physician and exercise physiologist before implementing an exercise routine and learn your capabilities and proper form for all exercise.  Medical   Aging: Enhancing Your Quality of Life  Clinical staff conducted group or individual video education with verbal and written material and guidebook.  Patient learns key strategies and recommendations to stay in good physical health and enhance quality of life, such as prevention strategies, having an advocate, securing a Health Care Proxy and Power of Attorney, and keeping a list of medications and system for tracking them. It also discusses how to avoid risk for bone loss.  Biology of Weight Control  Clinical staff conducted group or individual video education with verbal and written material and guidebook.  Patient learns that weight gain occurs because we consume more calories than we burn (eating more, moving less). Even if your body weight is normal, you may have higher ratios of fat compared to muscle mass. Too much body fat puts you at increased risk for cardiovascular disease, heart attack, stroke, type 2 diabetes, and obesity-related cancers. In addition to exercise, following the Pritikin Eating Plan can help reduce your risk.  Decoding Lab Results  Clinical staff  conducted group or individual video education with verbal and written material and guidebook.  Patient learns that lab test reflects one measurement whose values change over time and are influenced by many factors, including medication, stress, sleep, exercise, food, hydration, pre-existing medical conditions, and more. It is recommended to use the knowledge from this video to become more involved with your lab results and evaluate your numbers to speak with your doctor.   Diseases of Our Time - Overview  Clinical staff conducted group or individual video education with verbal and written material and guidebook.  Patient learns that according to the CDC, 50% to 70% of chronic diseases (such as obesity, type 2 diabetes, elevated lipids, hypertension, and heart disease) are avoidable through lifestyle improvements including healthier food choices, listening to satiety cues, and increased physical activity.  Sleep Disorders Clinical staff conducted group or individual video education with verbal and written material and guidebook.  Patient learns how good quality and duration of sleep are important to overall health and well-being. Patient also learns about sleep disorders and how they impact health along with recommendations to address them, including discussing with a physician.  Nutrition  Dining Out - Part 2 Clinical staff conducted group or individual video education with verbal and written material and guidebook.  Patient learns how to plan ahead and communicate in order to maximize their dining experience in a healthy and nutritious manner. Included are recommended food choices based on the type of restaurant the patient is visiting.   Fueling a Banker conducted group or individual video education with verbal and written material and guidebook.  There is a strong connection between our food choices and our health. Diseases like obesity and type 2 diabetes are very  prevalent and are in large-part due to lifestyle choices. The Pritikin Eating Plan provides plenty of food and hunger-curbing satisfaction. It is easy to follow, affordable, and helps reduce health risks.  Menu Workshop  Clinical staff conducted group or individual video education with verbal and written material and guidebook.  Patient learns that restaurant meals can sabotage health goals because they are  often packed with calories, fat, sodium, and sugar. Recommendations include strategies to plan ahead and to communicate with the manager, chef, or server to help order a healthier meal.  Planning Your Eating Strategy  Clinical staff conducted group or individual video education with verbal and written material and guidebook.  Patient learns about the Pritikin Eating Plan and its benefit of reducing the risk of disease. The Pritikin Eating Plan does not focus on calories. Instead, it emphasizes high-quality, nutrient-rich foods. By knowing the characteristics of the foods, we choose, we can determine their calorie density and make informed decisions.  Targeting Your Nutrition Priorities  Clinical staff conducted group or individual video education with verbal and written material and guidebook.  Patient learns that lifestyle habits have a tremendous impact on disease risk and progression. This video provides eating and physical activity recommendations based on your personal health goals, such as reducing LDL cholesterol, losing weight, preventing or controlling type 2 diabetes, and reducing high blood pressure.  Vitamins and Minerals  Clinical staff conducted group or individual video education with verbal and written material and guidebook.  Patient learns different ways to obtain key vitamins and minerals, including through a recommended healthy diet. It is important to discuss all supplements you take with your doctor.   Healthy Mind-Set    Smoking Cessation  Clinical staff conducted group  or individual video education with verbal and written material and guidebook.  Patient learns that cigarette smoking and tobacco addiction pose a serious health risk which affects millions of people. Stopping smoking will significantly reduce the risk of heart disease, lung disease, and many forms of cancer. Recommended strategies for quitting are covered, including working with your doctor to develop a successful plan.  Culinary   Becoming a Set designer conducted group or individual video education with verbal and written material and guidebook.  Patient learns that cooking at home can be healthy, cost-effective, quick, and puts them in control. Keys to cooking healthy recipes will include looking at your recipe, assessing your equipment needs, planning ahead, making it simple, choosing cost-effective seasonal ingredients, and limiting the use of added fats, salts, and sugars.  Cooking - Breakfast and Snacks  Clinical staff conducted group or individual video education with verbal and written material and guidebook.  Patient learns how important breakfast is to satiety and nutrition through the entire day. Recommendations include key foods to eat during breakfast to help stabilize blood sugar levels and to prevent overeating at meals later in the day. Planning ahead is also a key component.  Cooking - Educational psychologist conducted group or individual video education with verbal and written material and guidebook.  Patient learns eating strategies to improve overall health, including an approach to cook more at home. Recommendations include thinking of animal protein as a side on your plate rather than center stage and focusing instead on lower calorie dense options like vegetables, fruits, whole grains, and plant-based proteins, such as beans. Making sauces in large quantities to freeze for later and leaving the skin on your vegetables are also recommended to maximize  your experience.  Cooking - Healthy Salads and Dressing Clinical staff conducted group or individual video education with verbal and written material and guidebook.  Patient learns that vegetables, fruits, whole grains, and legumes are the foundations of the Pritikin Eating Plan. Recommendations include how to incorporate each of these in flavorful and healthy salads, and how to create homemade salad dressings. Proper handling of  ingredients is also covered. Cooking - Soups and [employer] - Soups and Desserts Clinical staff conducted group or individual video education with verbal and written material and guidebook.  Patient learns that Pritikin soups and desserts make for easy, nutritious, and delicious snacks and meal components that are low in sodium, fat, sugar, and calorie density, while high in vitamins, minerals, and filling fiber. Recommendations include simple and healthy ideas for soups and desserts.   Overview     The Pritikin Solution Program Overview Clinical staff conducted group or individual video education with verbal and written material and guidebook.  Patient learns that the results of the Pritikin Program have been documented in more than 100 articles published in peer-reviewed journals, and the benefits include reducing risk factors for (and, in some cases, even reversing) high cholesterol, high blood pressure, type 2 diabetes, obesity, and more! An overview of the three key pillars of the Pritikin Program will be covered: eating well, doing regular exercise, and having a healthy mind-set.  WORKSHOPS  Exercise: Exercise Basics: Building Your Action Plan Clinical staff led group instruction and group discussion with PowerPoint presentation and patient guidebook. To enhance the learning environment the use of posters, models and videos may be added. At the conclusion of this workshop, patients will comprehend the difference between physical activity and exercise, as well  as the benefits of incorporating both, into their routine. Patients will understand the FITT (Frequency, Intensity, Time, and Type) principle and how to use it to build an exercise action plan. In addition, safety concerns and other considerations for exercise and cardiac rehab will be addressed by the presenter. The purpose of this lesson is to promote a comprehensive and effective weekly exercise routine in order to improve patients' overall level of fitness.   Managing Heart Disease: Your Path to a Healthier Heart Clinical staff led group instruction and group discussion with PowerPoint presentation and patient guidebook. To enhance the learning environment the use of posters, models and videos may be added.At the conclusion of this workshop, patients will understand the anatomy and physiology of the heart. Additionally, they will understand how Pritikin's three pillars impact the risk factors, the progression, and the management of heart disease.  The purpose of this lesson is to provide a high-level overview of the heart, heart disease, and how the Pritikin lifestyle positively impacts risk factors.  Exercise Biomechanics Clinical staff led group instruction and group discussion with PowerPoint presentation and patient guidebook. To enhance the learning environment the use of posters, models and videos may be added. Patients will learn how the structural parts of their bodies function and how these functions impact their daily activities, movement, and exercise. Patients will learn how to promote a neutral spine, learn how to manage pain, and identify ways to improve their physical movement in order to promote healthy living. The purpose of this lesson is to expose patients to common physical limitations that impact physical activity. Participants will learn practical ways to adapt and manage aches and pains, and to minimize their effect on regular exercise. Patients will learn how to  maintain good posture while sitting, walking, and lifting.  Balance Training and Fall Prevention  Clinical staff led group instruction and group discussion with PowerPoint presentation and patient guidebook. To enhance the learning environment the use of posters, models and videos may be added. At the conclusion of this workshop, patients will understand the importance of their sensorimotor skills (vision, proprioception, and the vestibular system) in maintaining their ability  to balance as they age. Patients will apply a variety of balancing exercises that are appropriate for their current level of function. Patients will understand the common causes for poor balance, possible solutions to these problems, and ways to modify their physical environment in order to minimize their fall risk. The purpose of this lesson is to teach patients about the importance of maintaining balance as they age and ways to minimize their risk of falling.  WORKSHOPS   Nutrition:  Fueling a Ship broker led group instruction and group discussion with PowerPoint presentation and patient guidebook. To enhance the learning environment the use of posters, models and videos may be added. Patients will review the foundational principles of the Pritikin Eating Plan and understand what constitutes a serving size in each of the food groups. Patients will also learn Pritikin-friendly foods that are better choices when away from home and review make-ahead meal and snack options. Calorie density will be reviewed and applied to three nutrition priorities: weight maintenance, weight loss, and weight gain. The purpose of this lesson is to reinforce (in a group setting) the key concepts around what patients are recommended to eat and how to apply these guidelines when away from home by planning and selecting Pritikin-friendly options. Patients will understand how calorie density may be adjusted for different weight management  goals.  Mindful Eating  Clinical staff led group instruction and group discussion with PowerPoint presentation and patient guidebook. To enhance the learning environment the use of posters, models and videos may be added. Patients will briefly review the concepts of the Pritikin Eating Plan and the importance of low-calorie dense foods. The concept of mindful eating will be introduced as well as the importance of paying attention to internal hunger signals. Triggers for non-hunger eating and techniques for dealing with triggers will be explored. The purpose of this lesson is to provide patients with the opportunity to review the basic principles of the Pritikin Eating Plan, discuss the value of eating mindfully and how to measure internal cues of hunger and fullness using the Hunger Scale. Patients will also discuss reasons for non-hunger eating and learn strategies to use for controlling emotional eating.  Targeting Your Nutrition Priorities Clinical staff led group instruction and group discussion with PowerPoint presentation and patient guidebook. To enhance the learning environment the use of posters, models and videos may be added. Patients will learn how to determine their genetic susceptibility to disease by reviewing their family history. Patients will gain insight into the importance of diet as part of an overall healthy lifestyle in mitigating the impact of genetics and other environmental insults. The purpose of this lesson is to provide patients with the opportunity to assess their personal nutrition priorities by looking at their family history, their own health history and current risk factors. Patients will also be able to discuss ways of prioritizing and modifying the Pritikin Eating Plan for their highest risk areas  Menu  Clinical staff led group instruction and group discussion with PowerPoint presentation and patient guidebook. To enhance the learning environment the use of posters,  models and videos may be added. Using menus brought in from [employer], or printed from [employer], patients will apply the Pritikin dining out guidelines that were presented in the [employer] video. Patients will also be able to practice these guidelines in a variety of provided scenarios. The purpose of this lesson is to provide patients with the opportunity to practice hands-on learning of the Pritikin  Dining Out guidelines with actual menus and practice scenarios.  Label Reading Clinical staff led group instruction and group discussion with PowerPoint presentation and patient guidebook. To enhance the learning environment the use of posters, models and videos may be added. Patients will review and discuss the Pritikin label reading guidelines presented in Pritikin's Label Reading Educational series video. Using fool labels brought in from local grocery stores and markets, patients will apply the label reading guidelines and determine if the packaged food meet the Pritikin guidelines. The purpose of this lesson is to provide patients with the opportunity to review, discuss, and practice hands-on learning of the Pritikin Label Reading guidelines with actual packaged food labels. Cooking School  Pritikin's [employer] are designed to teach patients ways to prepare quick, simple, and affordable recipes at home. The importance of nutrition's role in chronic disease risk reduction is reflected in its emphasis in the overall Pritikin program. By learning how to prepare essential core Pritikin Eating Plan recipes, patients will increase control over what they eat; be able to customize the flavor of foods without the use of added salt, sugar, or fat; and improve the quality of the food they consume. By learning a set of core recipes which are easily assembled, quickly prepared, and affordable, patients are more likely to prepare more healthy foods at home. These workshops  focus on convenient breakfasts, simple entres, side dishes, and desserts which can be prepared with minimal effort and are consistent with nutrition recommendations for cardiovascular risk reduction. Cooking [employer] are taught by a Armed forces logistics/support/administrative officer (RD) who has been trained by the [employer]. The chef or RD has a clear understanding of the importance of minimizing - if not completely eliminating - added fat, sugar, and sodium in recipes. Throughout the series of Cooking School Workshop sessions, patients will learn about healthy ingredients and efficient methods of cooking to build confidence in their capability to prepare    Cooking School weekly topics:  Adding Flavor- Sodium-Free  Fast and Healthy Breakfasts  Powerhouse Plant-Based Proteins  Satisfying Salads and Dressings  Simple Sides and Sauces  International Cuisine-Spotlight on the [employer] Zones  Delicious Desserts  Savory Soups  [employer] - Meals in a Astronomer Appetizers and Snacks  Comforting Weekend Breakfasts  One-Pot Wonders   Fast Evening Meals  Landscape architect Your Pritikin Plate  WORKSHOPS   Healthy Mindset (Psychosocial):  Focused Goals, Sustainable Changes Clinical staff led group instruction and group discussion with PowerPoint presentation and patient guidebook. To enhance the learning environment the use of posters, models and videos may be added. Patients will be able to apply effective goal setting strategies to establish at least one personal goal, and then take consistent, meaningful action toward that goal. They will learn to identify common barriers to achieving personal goals and develop strategies to overcome them. Patients will also gain an understanding of how our mind-set can impact our ability to achieve goals and the importance of cultivating a positive and growth-oriented mind-set. The purpose of this lesson is to provide patients with a deeper  understanding of how to set and achieve personal goals, as well as the tools and strategies needed to overcome common obstacles which may arise along the way.  From Head to Heart: The Power of a Healthy Outlook  Clinical staff led group instruction and group discussion with PowerPoint presentation and patient guidebook. To enhance the learning environment the use of posters, models and videos  may be added. Patients will be able to recognize and describe the impact of emotions and mood on physical health. They will discover the importance of self-care and explore self-care practices which may work for them. Patients will also learn how to utilize the 4 C's to cultivate a healthier outlook and better manage stress and challenges. The purpose of this lesson is to demonstrate to patients how a healthy outlook is an essential part of maintaining good health, especially as they continue their cardiac rehab journey.  Healthy Sleep for a Healthy Heart Clinical staff led group instruction and group discussion with PowerPoint presentation and patient guidebook. To enhance the learning environment the use of posters, models and videos may be added. At the conclusion of this workshop, patients will be able to demonstrate knowledge of the importance of sleep to overall health, well-being, and quality of life. They will understand the symptoms of, and treatments for, common sleep disorders. Patients will also be able to identify daytime and nighttime behaviors which impact sleep, and they will be able to apply these tools to help manage sleep-related challenges. The purpose of this lesson is to provide patients with a general overview of sleep and outline the importance of quality sleep. Patients will learn about a few of the most common sleep disorders. Patients will also be introduced to the concept of "sleep hygiene," and discover ways to self-manage certain sleeping problems through simple daily behavior changes.  Finally, the workshop will motivate patients by clarifying the links between quality sleep and their goals of heart-healthy living.   Recognizing and Reducing Stress Clinical staff led group instruction and group discussion with PowerPoint presentation and patient guidebook. To enhance the learning environment the use of posters, models and videos may be added. At the conclusion of this workshop, patients will be able to understand the types of stress reactions, differentiate between acute and chronic stress, and recognize the impact that chronic stress has on their health. They will also be able to apply different coping mechanisms, such as reframing negative self-talk. Patients will have the opportunity to practice a variety of stress management techniques, such as deep abdominal breathing, progressive muscle relaxation, and/or guided imagery.  The purpose of this lesson is to educate patients on the role of stress in their lives and to provide healthy techniques for coping with it.  Learning Barriers/Preferences:  Learning Barriers/Preferences - 07/14/24 1419       Learning Barriers/Preferences   Learning Barriers None    Learning Preferences Audio;Computer/Internet;Group Instruction;Individual Instruction;Pictoral;Skilled Demonstration;Verbal Instruction;Video;Written Material          Education Topics:  Knowledge Questionnaire Score:  Knowledge Questionnaire Score - 07/14/24 1559       Knowledge Questionnaire Score   Pre Score 24/24          Core Components/Risk Factors/Patient Goals at Admission:  Personal Goals and Risk Factors at Admission - 07/14/24 1433       Core Components/Risk Factors/Patient Goals on Admission    Weight Management Yes;Obesity;Weight Loss    Intervention Weight Management/Obesity: Establish reasonable short term and long term weight goals.;Obesity: Provide education and appropriate resources to help participant work on and attain dietary goals.     Admit Weight 304 lb 0.2 oz (137.9 kg)    Expected Outcomes Short Term: Continue to assess and modify interventions until short term weight is achieved;Long Term: Adherence to nutrition and physical activity/exercise program aimed toward attainment of established weight goal;Weight Loss: Understanding of general recommendations for a balanced deficit  meal plan, which promotes 1-2 lb weight loss per week and includes a negative energy balance of [phone] kcal/d    Heart Failure Yes    Intervention Provide a combined exercise and nutrition program that is supplemented with education, support and counseling about heart failure. Directed toward relieving symptoms such as shortness of breath, decreased exercise tolerance, and extremity edema.    Expected Outcomes Improve functional capacity of life;Short term: Attendance in program 2-3 days a week with increased exercise capacity. Reported lower sodium intake. Reported increased fruit and vegetable intake. Reports medication compliance.;Short term: Daily weights obtained and reported for increase. Utilizing diuretic protocols set by physician.;Long term: Adoption of self-care skills and reduction of barriers for early signs and symptoms recognition and intervention leading to self-care maintenance.    Lipids Yes    Intervention Provide education and support for participant on nutrition & aerobic/resistive exercise along with prescribed medications to achieve LDL 70mg , HDL >40mg .    Expected Outcomes Short Term: Participant states understanding of desired cholesterol values and is compliant with medications prescribed. Participant is following exercise prescription and nutrition guidelines.;Long Term: Cholesterol controlled with medications as prescribed, with individualized exercise RX and with personalized nutrition plan. Value goals: LDL < 70mg , HDL > 40 mg.          Core Components/Risk Factors/Patient Goals Review:    Core Components/Risk  Factors/Patient Goals at Discharge (Final Review):    ITP Comments:  ITP Comments     Row Name 07/14/24 1325           ITP Comments Wilbert Bihari, MD: Medical Director. Introduction to the [employer] / Intensive Cardiac Rehab. Intial orientation packet reviewed with the patient.          Comments: Makhayla attended orientation for the cardiac rehabilitation program on  07/14/2024  to perform initial intake and exercise walk test. She was introduced to the [employer] education and orientation packet was reviewed. Completed 6-minute walk test, measurements, initial ITP, and exercise prescription. Vital signs stable. Telemetry-normal sinus rhythm with 1st degree AV block, mild shortness of breath during the walk test, which resolved with rest.   Service time was from 1300 to 1516. Arnoldo CHRISTELLA Gal, MS, ACSM CEP 07/14/2024 [phone]

## 2024-07-14 NOTE — Progress Notes (Signed)
 Cardiac Rehab Medication Review   Does the patient  feel that his/her medications are working for him/her?  yes  Has the patient been experiencing any side effects to the medications prescribed?  Skin dryness  Does the patient measure his/her own blood pressure or blood glucose at home?  Stacey Huerta is checking her BP occasionally   Does the patient have any problems obtaining medications due to transportation or finances?   no  Understanding of regimen: excellent Understanding of indications: excellent Potential of compliance: excellent    Comments: Medication review completed. Updated medication list.    Stacey Huerta 07/14/2024 2:04 PM

## 2024-07-14 NOTE — Progress Notes (Signed)
 Patient here for cardiac rehab orientation. Prolonged first degree heart block noted. Patient asymptomatic. Vital signs stable. Today's ECG tracings shown to onsite provider Damien Braver NP for review. Okay to proceed with exercise as long as is asymptomatic. Will show today's ECG tracings to Dr Zenaida for review.Hadassah Elpidio Quan RN BSN

## 2024-07-15 ENCOUNTER — Telehealth (HOSPITAL_COMMUNITY): Payer: Self-pay | Admitting: *Deleted

## 2024-07-15 NOTE — Telephone Encounter (Signed)
-----   Message from [name] sent at 07/14/2024  8:31 PM EDT ----- Thank you for the update, no change from our end as long as she is asymptomatic.   Ben ----- Message ----- From: Cyrus Hadassah ORN, RN Sent: 07/14/2024   3:42 PM EDT To: Damien JAYSON Braver, NP; [name], MD  Good afternoon Dr Brownie, I want to bring it to your attention that Ms Wrede continue to have a  prolonged first degree heart block as was mentioned in your office note. Patient asymptomatic. Vital signs stable. Please review attached ECG tracings and advise as needed.   I appreciate your input! Sincerely, [facility]  Cardiac Rehab

## 2024-07-20 ENCOUNTER — Encounter (HOSPITAL_COMMUNITY)
Admission: RE | Admit: 2024-07-20 | Discharge: 2024-07-20 | Disposition: A | Discharge status: Home / Self Care | Source: Ambulatory Visit | Attending: Cardiology | Admitting: Cardiology

## 2024-07-20 DIAGNOSIS — I5022 Chronic systolic (congestive) heart failure: Secondary | ICD-10-CM | POA: Diagnosis not present

## 2024-07-20 NOTE — Progress Notes (Signed)
 Daily Session Note  Patient Details  Name: Stacey Huerta MRN: [id number] Date of Birth: May 12, 1976 Referring Provider:   Flowsheet Row INTENSIVE CARDIAC REHAB ORIENT from 07/14/2024 in [facility]  Referring Provider Zenaida Morene PARAS, MD    Encounter Date: 07/20/2024  Check In:  Session Check In - 07/20/24 1421       Check-In   Supervising physician immediately available to respond to emergencies [facility] MD immediately available    Physician(s) Rosaline Bane, NP    Location [facility]    Staff Present Alm Parkins, MS, ACSM-CEP, CCRP, Exercise Physiologist;Olinty Valere, MS, ACSM-CEP, Exercise Physiologist;Jetta Vannie BS, ACSM-CEP, Exercise Physiologist;Johnny Fayette, MS, Exercise Physiologist;Javonta Gronau Lennon, RN, BSN    Virtual Visit No    Medication changes reported     No    Fall or balance concerns reported    No    Tobacco Cessation No Change    Warm-up and Cool-down Performed as group-led instruction    Resistance Training Performed Yes    VAD Patient? No    PAD/SET Patient? No      Pain Assessment   Currently in Pain? No/denies    Pain Score 0-No pain    Multiple Pain Sites No          Capillary Blood Glucose: No results found for this or any previous visit (from the past 24 hours).   Exercise Prescription Changes - 07/20/24 1627       Response to Exercise   Blood Pressure (Admit) 92/62    Blood Pressure (Exercise) 138/82    Blood Pressure (Exit) 96/64    Heart Rate (Admit) 92 bpm    Heart Rate (Exercise) 141 bpm    Heart Rate (Exit) 101 bpm    Rating of Perceived Exertion (Exercise) 11    Perceived Dyspnea (Exercise) 0    Symptoms None    Comments Pt first day in the [facility] program    Duration Progress to 30 minutes of  aerobic without signs/symptoms of physical distress    Intensity THRR unchanged      Progression   Progression Continue to progress workloads  to maintain intensity without signs/symptoms of physical distress.    Average METs 2.6      Resistance Training   Training Prescription Yes    Weight 5 lbs    Reps 10-15    Time 10 Minutes      Recumbant Bike   Level 1    RPM 79    Watts 13    Minutes 15    METs 2.1      NuStep   Level 1    SPM 112    Minutes 15    METs 3.1          Social History   Tobacco Use  Smoking Status Never   Passive exposure: Current  Smokeless Tobacco Never    Goals Met:  Exercise tolerated well No report of concerns or symptoms today Strength training completed today  Goals Unmet:  Not Applicable  Comments: Pt started cardiac rehab today.  Pt tolerated light exercise without difficulty. VSS, telemetry-SR with 1st deg AV block, asymptomatic.  Medication list reconciled. Pt denies barriers to medication compliance.  PSYCHOSOCIAL ASSESSMENT:  PHQ-7. Pt exhibits positive coping skills, hopeful outlook with supportive family. No psychosocial needs identified at this time, no psychosocial interventions necessary.   Pt oriented to exercise equipment and routine.  Understanding verbalized.     Dr. Wilbert Bihari is Medical Director for Cardiac Rehab at [facility]

## 2024-07-22 ENCOUNTER — Encounter (HOSPITAL_COMMUNITY)
Admission: RE | Admit: 2024-07-22 | Discharge: 2024-07-22 | Disposition: A | Discharge status: Home / Self Care | Source: Ambulatory Visit | Attending: Cardiology | Admitting: Cardiology

## 2024-07-22 DIAGNOSIS — I5022 Chronic systolic (congestive) heart failure: Secondary | ICD-10-CM | POA: Insufficient documentation

## 2024-07-24 ENCOUNTER — Encounter (HOSPITAL_COMMUNITY)
Admission: RE | Admit: 2024-07-24 | Discharge: 2024-07-24 | Disposition: A | Discharge status: Home / Self Care | Source: Ambulatory Visit | Attending: Cardiology | Admitting: Cardiology

## 2024-07-24 DIAGNOSIS — I5022 Chronic systolic (congestive) heart failure: Secondary | ICD-10-CM | POA: Diagnosis not present

## 2024-07-26 ENCOUNTER — Other Ambulatory Visit (HOSPITAL_BASED_OUTPATIENT_CLINIC_OR_DEPARTMENT_OTHER): Payer: Self-pay | Admitting: Family

## 2024-07-26 DIAGNOSIS — I5022 Chronic systolic (congestive) heart failure: Secondary | ICD-10-CM

## 2024-07-27 ENCOUNTER — Encounter (HOSPITAL_COMMUNITY)
Admission: RE | Admit: 2024-07-27 | Discharge: 2024-07-27 | Disposition: A | Discharge status: Home / Self Care | Source: Ambulatory Visit | Attending: Cardiology | Admitting: Cardiology

## 2024-07-27 DIAGNOSIS — I5022 Chronic systolic (congestive) heart failure: Secondary | ICD-10-CM

## 2024-07-29 ENCOUNTER — Encounter (HOSPITAL_COMMUNITY)
Admission: RE | Admit: 2024-07-29 | Discharge: 2024-07-29 | Disposition: A | Discharge status: Home / Self Care | Source: Ambulatory Visit | Attending: Cardiology | Admitting: Cardiology

## 2024-07-29 DIAGNOSIS — I5022 Chronic systolic (congestive) heart failure: Secondary | ICD-10-CM | POA: Diagnosis not present

## 2024-07-29 NOTE — Progress Notes (Signed)
 Cardiac Individual Treatment Plan  Patient Details  Name: Stacey Huerta MRN: [id number] Date of Birth: Feb 27, 1976 Referring Provider:   Flowsheet Row INTENSIVE CARDIAC REHAB ORIENT from 07/14/2024 in [facility] [place] [facility] Heart, Vascular, & Lung Health  Referring Provider Zenaida Morene PARAS, MD    Initial Encounter Date:  Flowsheet Row INTENSIVE CARDIAC REHAB ORIENT from 07/14/2024 in [facility] for Heart, Vascular, & Lung Health  Date 07/14/24    Visit Diagnosis: Heart failure, chronic systolic (HCC)  Patient's Home Medications on Admission:  Current Outpatient Medications:    FARXIGA  10 MG TABS tablet, TAKE 1 TABLET BY MOUTH DAILY BEFORE BREAKFAST., Disp: 90 tablet, Rfl: 1   aspirin  EC 81 MG tablet, Take 1 tablet (81 mg total) by mouth daily., Disp: , Rfl:    furosemide  (LASIX ) 40 MG tablet, Take 2 tablets (80 mg total) by mouth daily., Disp: 60 tablet, Rfl: 11   losartan  (COZAAR ) 25 MG tablet, Take 1 tablet (25 mg total) by mouth daily. (Patient taking differently: Take 12.5 mg by mouth 2 (two) times daily. 1/2 tab twice a day), Disp: 90 tablet, Rfl: 3   MAGNESIUM  PO, Take 1 tablet by mouth 2 (two) times a week., Disp: , Rfl:    metoprolol  succinate (TOPROL -XL) 25 MG 24 hr tablet, Take 12.5 mg by mouth daily., Disp: , Rfl:    Omega-3 Fatty Acids (FISH OIL) 1000 MG CAPS, Take 1,000 mg by mouth 2 (two) times a week., Disp: , Rfl:    Probiotic Product (PROBIOTIC-10 PO), Take 1 tablet by mouth 2 (two) times a week., Disp: , Rfl:    UBIQUINONE PO, Take 100 mg by mouth 2 (two) times a week., Disp: , Rfl:    VITAMIN D  PO, Take 1 tablet by mouth 3 (three) times a week., Disp: , Rfl:    VITAMIN E PO, Take 1 tablet by mouth 3 (three) times a week., Disp: , Rfl:    zinc gluconate 50 MG tablet, Take 50 mg by mouth 2 (two) times a week., Disp: , Rfl:   Past Medical History: Past Medical History:  Diagnosis Date   Adjustment disorder with anxiety  12/05/2022   Allergy     Back pain    Bilateral primary osteoarthritis of knee 02/12/2023   Congestive heart failure (HCC) 09/2023   Depressive episode 11/01/2022   Dysphoric mood 12/14/2014   Dyspnea    Fibroid    Hyperglycemia 12/19/2015   Infertility, female    Influenza 12/23/2018   Insulin  resistance 10/08/2018   Lack of concentration 11/01/2022   Leg edema    Major depressive disorder, recurrent episode, moderate (HCC) 11/21/2022   Morbid obesity (HCC) 03/16/2014   OBESITY, UNSPECIFIED 04/06/2008   Qualifier: Diagnosis of   By: Antonio ROSALEA Rockers         Osteoarthritis    Other fatigue 12/08/2019   Other hyperlipidemia 10/08/2018   Pansinusitis 12/23/2018   Postpartum depression    Preeclampsia 03/16/2014   Preventative health care 11/01/2022   Seasonal affective disorder 12/05/2022   Sjogren's syndrome 10/28/2015   Thrombocytopenia, unspecified 03/16/2014   Urticaria due to food allergy  09/28/2012   Allergy  Profile 07/23/12- specific elevations for dust mite, dog dander, cockroach and some weed pollens.  Food IgG profile- elevated to all agents tested, as has been my experience with this test.   Allergy  Profile 09/15/12- Total IgE 536.8, specific elevations for orange, chicken, apple, shrimp, tuna  ANA 09/15/12- Pos speckled 1:160  ESR  23        Vitamin B12 deficiency 11/01/2022   Vitamin D  deficiency    Xerophthalmia 04/15/2023   Xerostomia 02/12/2023    Tobacco Use: Social History   Tobacco Use  Smoking Status Never   Passive exposure: Current  Smokeless Tobacco Never    Labs: Review Flowsheet  More data exists      Latest Ref Rng & Units 05/12/2020 11/01/2022 12/23/2023 03/17/2024 06/12/2024  Labs for ITP Cardiac and Pulmonary Rehab  Cholestrol 0 - 200 mg/dL 847  813  861  877  -  LDL (calc) 0 - 99 mg/dL 92  883  84  65  -  HDL-C >39.00 mg/dL 43  60.99  37  62.89  -  Trlycerides 0.0 - 149.0 mg/dL 90  844.9  89  03.9  -  Hemoglobin A1c 4.6 - 6.5 % 5.5  5.2  -  5.8  -  PH, Arterial 7.35 - 7.45 7.35 - 7.45 - - - - 7.391  7.414   PCO2 arterial 32 - 48 mmHg 32 - 48 mmHg - - - - 44.9  44.8   Bicarbonate 20.0 - 28.0 mmol/L 20.0 - 28.0 mmol/L - - - - 27.2  28.6   TCO2 22 - 32 mmol/L 22 - 32 mmol/L - - - - 29  30   O2 Saturation % % - - - - 65  69     Details       Multiple values from one day are sorted in reverse-chronological order          Exercise Target Goals: Exercise Program Goal: Individual exercise prescription set using results from initial 6 min walk test and THRR while considering  patient's activity barriers and safety.   Exercise Prescription Goal: Initial exercise prescription builds to 30-45 minutes a day of aerobic activity, 2-3 days per week.  Home exercise guidelines will be given to patient during program as part of exercise prescription that the participant will acknowledge.   Education: Aerobic Exercise: - Group verbal and visual presentation on the components of exercise prescription. Introduces F.I.T.T principle from ACSM for exercise prescriptions.  Reviews F.I.T.T. principles of aerobic exercise including progression. Written material provided at class time.   Education: Resistance Exercise: - Group verbal and visual presentation on the components of exercise prescription. Introduces F.I.T.T principle from ACSM for exercise prescriptions  Reviews F.I.T.T. principles of resistance exercise including progression. Written material provided at class time.    Education: Exercise & Equipment Safety: - Individual verbal instruction and demonstration of equipment use and safety with use of the equipment.   Education: Exercise Physiology & General Exercise Guidelines: - Group verbal and written instruction with models to review the exercise physiology of the cardiovascular system and associated critical values. Provides general exercise guidelines with specific guidelines to those with heart or lung disease. Written  material provided at class time.   Education: Flexibility, Balance, Mind/Body Relaxation: - Group verbal and visual presentation with interactive activity on the components of exercise prescription. Introduces F.I.T.T principle from ACSM for exercise prescriptions. Reviews F.I.T.T. principles of flexibility and balance exercise training including progression. Also discusses the mind body connection.  Reviews various relaxation techniques to help reduce and manage stress (i.e. Deep breathing, progressive muscle relaxation, and visualization). Balance handout provided to take home. Written material provided at class time.   Activity Barriers & Risk Stratification:  Activity Barriers & Cardiac Risk Stratification - 07/14/24 1334       Activity Barriers &  Cardiac Risk Stratification   Activity Barriers Decreased Ventricular Function;Shortness of Breath;Other (comment)    Comments Right foot surgery in 2022, currently in PT.    Cardiac Risk Stratification High          6 Minute Walk:  6 Minute Walk     Row Name 07/14/24 1454         6 Minute Walk   Phase Initial     Distance 1068 feet     Walk Time 6 minutes     # of Rest Breaks 2     MPH 2.02     METS 2.71     RPE 15     Perceived Dyspnea  2.5     VO2 Peak 9.49     Symptoms Yes (comment)     Comments Shortness of breath, mild with difficulty.     Resting HR 84 bpm     Resting BP 94/60     Resting Oxygen Saturation  99 %     Exercise Oxygen Saturation  during 6 min walk 100 %     Max Ex. HR 133 bpm     Max Ex. BP 108/70     2 Minute Post BP 100/68        Oxygen Initial Assessment:   Oxygen Re-Evaluation:   Oxygen Discharge (Final Oxygen Re-Evaluation):   Initial Exercise Prescription:  Initial Exercise Prescription - 07/14/24 1500       Date of Initial Exercise RX and Referring Provider   Date 07/14/24    Referring Provider Zenaida Morene PARAS, MD    Expected Discharge Date 10/07/24      Recumbant Bike    Level 1    RPM 25    Watts 15    Minutes 15    METs 1.6      NuStep   Level 1    SPM 85    Minutes 15    METs 2      Prescription Details   Frequency (times per week) 3    Duration Progress to 30 minutes of continuous aerobic without signs/symptoms of physical distress      Intensity   THRR 40-80% of Max Heartrate 68-136    Ratings of Perceived Exertion 11-13    Perceived Dyspnea 0-4      Progression   Progression Continue to progress workloads to maintain intensity without signs/symptoms of physical distress.      Resistance Training   Training Prescription Yes    Weight 5 lbs    Reps 10-15          Perform Capillary Blood Glucose checks as needed.  Exercise Prescription Changes:   Exercise Prescription Changes     Row Name 07/20/24 1627             Response to Exercise   Blood Pressure (Admit) 92/62       Blood Pressure (Exercise) 138/82       Blood Pressure (Exit) 96/64       Heart Rate (Admit) 92 bpm       Heart Rate (Exercise) 141 bpm       Heart Rate (Exit) 101 bpm       Rating of Perceived Exertion (Exercise) 11       Perceived Dyspnea (Exercise) 0       Symptoms None       Comments Pt first day in the [facility] program       Duration Progress to 30 minutes  of  aerobic without signs/symptoms of physical distress       Intensity THRR unchanged         Progression   Progression Continue to progress workloads to maintain intensity without signs/symptoms of physical distress.       Average METs 2.6         Resistance Training   Training Prescription Yes       Weight 5 lbs       Reps 10-15       Time 10 Minutes         Recumbant Bike   Level 1       RPM 79       Watts 13       Minutes 15       METs 2.1         NuStep   Level 1       SPM 112       Minutes 15       METs 3.1          Exercise Comments:   Exercise Comments     Row Name 07/20/24 1632           Exercise Comments Pt first day in the [facility] program. Pt  tolerated exercise well with an average MET level of 2.6. She is off to a good start and is learning her THRR, RPE and ExRx          Exercise Goals and Review:   Exercise Goals     Row Name 07/14/24 1325             Exercise Goals   Increase Physical Activity Yes       Intervention Provide advice, education, support and counseling about physical activity/exercise needs.;Develop an individualized exercise prescription for aerobic and resistive training based on initial evaluation findings, risk stratification, comorbidities and participant's personal goals.       Expected Outcomes Short Term: Attend rehab on a regular basis to increase amount of physical activity.;Long Term: Exercising regularly at least 3-5 days a week.;Long Term: Add in home exercise to make exercise part of routine and to increase amount of physical activity.       Increase Strength and Stamina Yes       Intervention Provide advice, education, support and counseling about physical activity/exercise needs.;Develop an individualized exercise prescription for aerobic and resistive training based on initial evaluation findings, risk stratification, comorbidities and participant's personal goals.       Expected Outcomes Short Term: Increase workloads from initial exercise prescription for resistance, speed, and METs.;Short Term: Perform resistance training exercises routinely during rehab and add in resistance training at home;Long Term: Improve cardiorespiratory fitness, muscular endurance and strength as measured by increased METs and functional capacity ( )       Able to understand and use rate of perceived exertion (RPE) scale Yes       Intervention Provide education and explanation on how to use RPE scale       Expected Outcomes Short Term: Able to use RPE daily in rehab to express subjective intensity level;Long Term:  Able to use RPE to guide intensity level when exercising independently       Knowledge and  understanding of Target Heart Rate Range (THRR) Yes       Intervention Provide education and explanation of THRR including how the numbers were predicted and where they are located for reference       Expected Outcomes Short  Term: Able to state/look up THRR;Long Term: Able to use THRR to govern intensity when exercising independently;Short Term: Able to use daily as guideline for intensity in rehab       Understanding of Exercise Prescription Yes       Intervention Provide education, explanation, and written materials on patient's individual exercise prescription       Expected Outcomes Short Term: Able to explain program exercise prescription;Long Term: Able to explain home exercise prescription to exercise independently          Exercise Goals Re-Evaluation :  Exercise Goals Re-Evaluation     Row Name 07/20/24 1630             Exercise Goal Re-Evaluation   Exercise Goals Review Increase Physical Activity;Understanding of Exercise Prescription;Increase Strength and Stamina;Knowledge and understanding of Target Heart Rate Range (THRR);Able to understand and use rate of perceived exertion (RPE) scale       Comments Pt first day in the [facility] program. Pt tolerated exercise well with an average MET level of 2.6. She is off to a good start and is learning her THRR, RPE and ExRx. Slightly over THRR, will let her know on her net visit. Remained asymptomatic       Expected Outcomes Will continue to monitor patient and progress workloads as tolerated without sign or symptom          Discharge Exercise Prescription (Final Exercise Prescription Changes):  Exercise Prescription Changes - 07/20/24 1627       Response to Exercise   Blood Pressure (Admit) 92/62    Blood Pressure (Exercise) 138/82    Blood Pressure (Exit) 96/64    Heart Rate (Admit) 92 bpm    Heart Rate (Exercise) 141 bpm    Heart Rate (Exit) 101 bpm    Rating of Perceived Exertion (Exercise) 11    Perceived Dyspnea  (Exercise) 0    Symptoms None    Comments Pt first day in the [facility] program    Duration Progress to 30 minutes of  aerobic without signs/symptoms of physical distress    Intensity THRR unchanged      Progression   Progression Continue to progress workloads to maintain intensity without signs/symptoms of physical distress.    Average METs 2.6      Resistance Training   Training Prescription Yes    Weight 5 lbs    Reps 10-15    Time 10 Minutes      Recumbant Bike   Level 1    RPM 79    Watts 13    Minutes 15    METs 2.1      NuStep   Level 1    SPM 112    Minutes 15    METs 3.1          Nutrition:  Target Goals: Understanding of nutrition guidelines, daily intake of sodium 1500mg , cholesterol 200mg , calories 30% from fat and 7% or less from saturated fats, daily to have 5 or more servings of fruits and vegetables.  Education: Nutrition 1 -Group instruction provided by verbal, written material, interactive activities, discussions, models, and posters to present general guidelines for heart healthy nutrition including macronutrients, label reading, and promoting whole foods over processed counterparts. Education serves as Pensions consultant of discussion of heart healthy eating for all. Written material provided at class time.    Education: Nutrition 2 -Group instruction provided by verbal, written material, interactive activities, discussions, models, and posters to present general guidelines for heart  healthy nutrition including sodium, cholesterol, and saturated fat. Providing guidance of habit forming to improve blood pressure, cholesterol, and body weight. Written material provided at class time.     Biometrics:  Pre Biometrics - 07/14/24 1254       Pre Biometrics   Waist Circumference 53 inches    Hip Circumference 60 inches    Waist to Hip Ratio 0.88 %    Triceps Skinfold 48 mm    % Body Fat 54.2 %    Grip Strength 33 kg    Flexibility 17.88 in    Single  Leg Stand 30 seconds           Nutrition Therapy Plan and Nutrition Goals:   Nutrition Assessments:  Nutrition Assessments - 07/22/24 1632       Rate Your Plate Scores   Pre Score 64         MEDIFICTS Score Key: >=70 Need to make dietary changes  40-70 Heart Healthy Diet <= 40 Therapeutic Level Cholesterol Diet  Flowsheet Row INTENSIVE CARDIAC REHAB from 07/22/2024 in [facility] for Heart, Vascular, & Lung Health  Picture Your Plate Total Score on Admission 64   Picture Your Plate Scores: <59 Unhealthy dietary pattern with much room for improvement. 41-50 Dietary pattern unlikely to meet recommendations for good health and room for improvement. 51-60 More healthful dietary pattern, with some room for improvement.  >60 Healthy dietary pattern, although there may be some specific behaviors that could be improved.    Nutrition Goals Re-Evaluation:   Nutrition Goals Discharge (Final Nutrition Goals Re-Evaluation):   Psychosocial: Target Goals: Acknowledge presence or absence of significant depression and/or stress, maximize coping skills, provide positive support system. Participant is able to verbalize types and ability to use techniques and skills needed for reducing stress and depression.   Education: Stress, Anxiety, and Depression - Group verbal and visual presentation to define topics covered.  Reviews how body is impacted by stress, anxiety, and depression.  Also discusses healthy ways to reduce stress and to treat/manage anxiety and depression. Written material provided at class time.   Education: Sleep Hygiene -Provides group verbal and written instruction about how sleep can affect your health.  Define sleep hygiene, discuss sleep cycles and impact of sleep habits. Review good sleep hygiene tips.   Initial Review & Psychosocial Screening:  Initial Psych Review & Screening - 07/14/24 1329       Initial Review   Current issues with  Current Sleep Concerns      Family Dynamics   Good Support System? Yes    Comments Alaija has excellent support from her mother, boyfriend, best friend, and Systems analyst.      Barriers   Psychosocial barriers to participate in program There are no identifiable barriers or psychosocial needs.      Screening Interventions   Interventions Encouraged to exercise;Provide feedback about the scores to participant    Expected Outcomes Short Term goal: Identification and review with participant of any Quality of Life or Depression concerns found by scoring the questionnaire.;Long Term goal: The participant improves quality of Life and PHQ9 Scores as seen by post scores and/or verbalization of changes          Quality of Life Scores:   Quality of Life - 07/14/24 1328       Quality of Life   Select Quality of Life      Quality of Life Scores   Health/Function Pre 17.79 %  Socioeconomic Pre 23.17 %    Psych/Spiritual Pre 20.58 %    Family Pre 20 %    GLOBAL Pre 19.72 %         Scores of 19 and below usually indicate a poorer quality of life in these areas.  A difference of  2-3 points is a clinically meaningful difference.  A difference of 2-3 points in the total score of the Quality of Life Index has been associated with significant improvement in overall quality of life, self-image, physical symptoms, and general health in studies assessing change in quality of life.  PHQ-9: Review Flowsheet  More data exists      07/14/2024 03/17/2024 01/07/2023 11/01/2022 11/01/2020  Depression screen PHQ 2/9  Decreased Interest 1 0  3 1  Down, Depressed, Hopeless 0 1  3 1   PHQ - 2 Score 1 1  6 2   Altered sleeping 2 -  3 -  Tired, decreased energy 3 -  1 -  Change in appetite 0 -  3 -  Feeling bad or failure about yourself  0 -  - -  Trouble concentrating 1 -  0 -  Moving slowly or fidgety/restless 0 -  0 -  Suicidal thoughts 0 -  - -  PHQ-9 Score 7 -  13 -  Difficult doing work/chores  Somewhat difficult - - Extremely dIfficult -    Details       Information is confidential and restricted. Go to Review Flowsheets to unlock data.        Interpretation of Total Score  Total Score Depression Severity:  1-4 = Minimal depression, 5-9 = Mild depression, 10-14 = Moderate depression, 15-19 = Moderately severe depression, 20-27 = Severe depression   Psychosocial Evaluation and Intervention:   Psychosocial Re-Evaluation:  Psychosocial Re-Evaluation     Row Name 07/29/24 1059             Psychosocial Re-Evaluation   Current issues with Current Sleep Concerns       Comments Nathanel has not voiced any increased concerns or stressors during exercise at cardiac rehab       Expected Outcomes Mikesha will have decreased or controlled stress upon completion of cardiac rehab       Interventions Encouraged to attend Cardiac Rehabilitation for the exercise;Stress management education;Relaxation education       Continue Psychosocial Services  Follow up required by staff          Psychosocial Discharge (Final Psychosocial Re-Evaluation):  Psychosocial Re-Evaluation - 07/29/24 1059       Psychosocial Re-Evaluation   Current issues with Current Sleep Concerns    Comments Nathanel has not voiced any increased concerns or stressors during exercise at cardiac rehab    Expected Outcomes Lanie will have decreased or controlled stress upon completion of cardiac rehab    Interventions Encouraged to attend Cardiac Rehabilitation for the exercise;Stress management education;Relaxation education    Continue Psychosocial Services  Follow up required by staff          Vocational Rehabilitation: Provide vocational rehab assistance to qualifying candidates.   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 No vocational rehab needs.          Education: Education Goals: Education  classes will be provided on a variety of topics geared toward better understanding of heart health and risk factor modification. Participant will state understanding/return demonstration of topics presented as noted by education test scores.  Learning Barriers/Preferences:  Learning Barriers/Preferences - 07/14/24 1419       Learning Barriers/Preferences   Learning Barriers None    Learning Preferences Audio;Computer/Internet;Group Instruction;Individual Instruction;Pictoral;Skilled Demonstration;Verbal Instruction;Video;Written Material          General Cardiac Education Topics:  AED/CPR: - Group verbal and written instruction with the use of models to demonstrate the basic use of the AED with the basic ABC's of resuscitation.   Test and Procedures: - Group verbal and visual presentation and models provide information about basic cardiac anatomy and function. Reviews the testing methods done to diagnose heart disease and the outcomes of the test results. Describes the treatment choices: Medical Management, Angioplasty, or Coronary Bypass Surgery for treating various heart conditions including Myocardial Infarction, Angina, Valve Disease, and Cardiac Arrhythmias. Written material provided at class time.   Medication Safety: - Group verbal and visual instruction to review commonly prescribed medications for heart and lung disease. Reviews the medication, class of the drug, and side effects. Includes the steps to properly store meds and maintain the prescription regimen. Written material provided at class time.   Intimacy: - Group verbal instruction through game format to discuss how heart and lung disease can affect sexual intimacy. Written material provided at class time.   Know Your Numbers and Heart Failure: - Group verbal and visual  instruction to discuss disease risk factors for cardiac and pulmonary disease and treatment options.  Reviews associated critical values for Overweight/Obesity, Hypertension, Cholesterol, and Diabetes.  Discusses basics of heart failure: signs/symptoms and treatments.  Introduces Heart Failure Zone chart for action plan for heart failure. Written material provided at class time.   Infection Prevention: - Provides verbal and written material to individual with discussion of infection control including proper hand washing and proper equipment cleaning during exercise session.   Falls Prevention: - Provides verbal and written material to individual with discussion of falls prevention and safety.   Other: -Provides group and verbal instruction on various topics (see comments)   Knowledge Questionnaire Score:  Knowledge Questionnaire Score - 07/14/24 1559       Knowledge Questionnaire Score   Pre Score 24/24          Core Components/Risk Factors/Patient Goals at Admission:  Personal Goals and Risk Factors at Admission - 07/14/24 1433       Core Components/Risk Factors/Patient Goals on Admission    Weight Management Yes;Obesity;Weight Loss    Intervention Weight Management/Obesity: Establish reasonable short term and long term weight goals.;Obesity: Provide education and appropriate resources to help participant work on and attain dietary goals.    Admit Weight 304 lb 0.2 oz (137.9 kg)    Expected Outcomes Short Term: Continue to assess and modify interventions until short term weight is achieved;Long Term: Adherence to nutrition and physical activity/exercise program aimed toward attainment of established weight goal;Weight Loss: Understanding of general recommendations for a balanced deficit meal plan, which promotes 1-2 lb weight loss per week and includes a negative energy balance of [phone] kcal/d    Heart Failure Yes    Intervention Provide a combined exercise and nutrition  program that is supplemented with education, support and counseling about heart failure. Directed toward relieving symptoms such as shortness of breath, decreased exercise tolerance, and extremity edema.    Expected Outcomes Improve functional  capacity of life;Short term: Attendance in program 2-3 days a week with increased exercise capacity. Reported lower sodium intake. Reported increased fruit and vegetable intake. Reports medication compliance.;Short term: Daily weights obtained and reported for increase. Utilizing diuretic protocols set by physician.;Long term: Adoption of self-care skills and reduction of barriers for early signs and symptoms recognition and intervention leading to self-care maintenance.    Lipids Yes    Intervention Provide education and support for participant on nutrition & aerobic/resistive exercise along with prescribed medications to achieve LDL 70mg , HDL >40mg .    Expected Outcomes Short Term: Participant states understanding of desired cholesterol values and is compliant with medications prescribed. Participant is following exercise prescription and nutrition guidelines.;Long Term: Cholesterol controlled with medications as prescribed, with individualized exercise RX and with personalized nutrition plan. Value goals: LDL < 70mg , HDL > 40 mg.          Education:Diabetes - Individual verbal and written instruction to review signs/symptoms of diabetes, desired ranges of glucose level fasting, after meals and with exercise. Acknowledge that pre and post exercise glucose checks will be done for 3 sessions at entry of program.   Core Components/Risk Factors/Patient Goals Review:   Goals and Risk Factor Review     Row Name 07/29/24 1103             Core Components/Risk Factors/Patient Goals Review   Personal Goals Review Weight Management/Obesity;Heart Failure;Lipids       Review Tirsa started cardiac rehab on 07/20/24 and is off to a good start to exercise. Vital  signs have been stable.       Expected Outcomes Jaemarie will continue to participate in cardiac rehab for exercise, nutrition and lifestyle modifications.          Core Components/Risk Factors/Patient Goals at Discharge (Final Review):   Goals and Risk Factor Review - 07/29/24 1103       Core Components/Risk Factors/Patient Goals Review   Personal Goals Review Weight Management/Obesity;Heart Failure;Lipids    Review Zhania started cardiac rehab on 07/20/24 and is off to a good start to exercise. Vital signs have been stable.    Expected Outcomes Zhane will continue to participate in cardiac rehab for exercise, nutrition and lifestyle modifications.          ITP Comments:  ITP Comments     Row Name 07/14/24 1325 07/29/24 1058         ITP Comments Wilbert Bihari, MD: Medical Director. Introduction to the [facility] / Intensive Cardiac Rehab. Intial orientation packet reviewed with the patient. 30 Day ITP review.  Yarlin started cardiac rehab on 07/20/24.  Estie did well with exercise         Comments: see ITP comments

## 2024-07-31 ENCOUNTER — Encounter (HOSPITAL_COMMUNITY)
Admission: RE | Admit: 2024-07-31 | Discharge: 2024-07-31 | Disposition: A | Discharge status: Home / Self Care | Source: Ambulatory Visit | Attending: Cardiology | Admitting: Cardiology

## 2024-07-31 DIAGNOSIS — I5022 Chronic systolic (congestive) heart failure: Secondary | ICD-10-CM

## 2024-08-03 ENCOUNTER — Encounter (HOSPITAL_COMMUNITY)
Admission: RE | Admit: 2024-08-03 | Discharge: 2024-08-03 | Disposition: A | Discharge status: Home / Self Care | Source: Ambulatory Visit | Attending: Cardiology | Admitting: Cardiology

## 2024-08-03 DIAGNOSIS — I5022 Chronic systolic (congestive) heart failure: Secondary | ICD-10-CM

## 2024-08-05 ENCOUNTER — Encounter (HOSPITAL_COMMUNITY)
Admission: RE | Admit: 2024-08-05 | Discharge: 2024-08-05 | Disposition: A | Discharge status: Home / Self Care | Source: Ambulatory Visit | Attending: Cardiology | Admitting: Cardiology

## 2024-08-05 DIAGNOSIS — I5022 Chronic systolic (congestive) heart failure: Secondary | ICD-10-CM

## 2024-08-07 ENCOUNTER — Encounter (HOSPITAL_COMMUNITY)
Admission: RE | Admit: 2024-08-07 | Discharge: 2024-08-07 | Disposition: A | Discharge status: Home / Self Care | Source: Ambulatory Visit | Attending: Cardiology | Admitting: Cardiology

## 2024-08-07 DIAGNOSIS — I5022 Chronic systolic (congestive) heart failure: Secondary | ICD-10-CM

## 2024-08-10 NOTE — Progress Notes (Signed)
 QUALITY OF LIFE SCORE REVIEW  Pt completed Quality of Life survey as a participant in Cardiac Rehab.  Scores 19.0 or below are considered low.Review PHQ9.  Pt score  low in the health and functioning area of her quality of life questionnaire.  Overall 19.72, Health and Function 17.79, socioeconomic 23.17, physiological and spiritual 20.58, family 20.00. Patient quality of life slightly altered by physical constraints which limits ability to perform as prior to recent cardiac illness.  ***().  Offered emotional support and reassurance.  Will continue to monitor and intervene as necessary.

## 2024-08-13 ENCOUNTER — Telehealth (HOSPITAL_COMMUNITY): Payer: Self-pay

## 2024-08-13 NOTE — Telephone Encounter (Signed)
 Called patient and left message stating FMLA paper work is ready to be collected and that she is due for a follow up appointment with Dr. Zenaida.

## 2024-08-17 ENCOUNTER — Encounter (HOSPITAL_COMMUNITY)
Admission: RE | Admit: 2024-08-17 | Discharge: 2024-08-17 | Disposition: A | Discharge status: Home / Self Care | Source: Ambulatory Visit | Attending: Cardiology | Admitting: Cardiology

## 2024-08-17 DIAGNOSIS — I5022 Chronic systolic (congestive) heart failure: Secondary | ICD-10-CM | POA: Diagnosis not present

## 2024-08-19 ENCOUNTER — Encounter (HOSPITAL_COMMUNITY)
Admission: RE | Admit: 2024-08-19 | Discharge: 2024-08-19 | Disposition: A | Discharge status: Home / Self Care | Source: Ambulatory Visit | Attending: Cardiology | Admitting: Cardiology

## 2024-08-19 DIAGNOSIS — I5022 Chronic systolic (congestive) heart failure: Secondary | ICD-10-CM | POA: Diagnosis not present

## 2024-08-21 ENCOUNTER — Encounter (HOSPITAL_COMMUNITY)
Admission: RE | Admit: 2024-08-21 | Discharge: 2024-08-21 | Disposition: A | Discharge status: Home / Self Care | Source: Ambulatory Visit | Attending: Cardiology | Admitting: Cardiology

## 2024-08-21 DIAGNOSIS — I5022 Chronic systolic (congestive) heart failure: Secondary | ICD-10-CM | POA: Diagnosis not present

## 2024-08-24 ENCOUNTER — Encounter (HOSPITAL_COMMUNITY)
Admission: RE | Admit: 2024-08-24 | Discharge: 2024-08-24 | Disposition: A | Discharge status: Home / Self Care | Source: Ambulatory Visit | Attending: Cardiology | Admitting: Cardiology

## 2024-08-24 ENCOUNTER — Telehealth (HOSPITAL_COMMUNITY): Payer: Self-pay

## 2024-08-24 DIAGNOSIS — I5022 Chronic systolic (congestive) heart failure: Secondary | ICD-10-CM | POA: Insufficient documentation

## 2024-08-24 DIAGNOSIS — I440 Atrioventricular block, first degree: Secondary | ICD-10-CM | POA: Insufficient documentation

## 2024-08-24 NOTE — Telephone Encounter (Signed)
-----   Message from [name] sent at 08/24/2024 11:53 AM EST ----- Regarding: RE:  Agreed, this sounds great.  ----- Message ----- From: Vannie Alec RAMAN Sent: 08/24/2024   9:47 AM EST To: [name], MD  CARDIAC REHAB PHASE 2  Good morning,  Our mutual patient Stacey Huerta has been in cardiac rehab approximately 5 weeks and is doing well. Blood pressure is within normal limits, but exercise heart rates are beginning to exceed Target Heart Rate Range(THRR) of 68-136 bpm (40%-80% of age predicted max HR).  If no GXT is planned for the near future and MD agrees, request to increase THR to 40% -90% of age predicted max HR 68-153 bpm.   Thanks for your assistance,  Alec RAMAN Vannie ACSM-CEP

## 2024-08-25 NOTE — Progress Notes (Signed)
 Cardiac Individual Treatment Plan  Patient Details  Name: Stacey Huerta MRN: [id number] Date of Birth: Jul 09, 1976 Referring Provider:   Flowsheet Row INTENSIVE CARDIAC REHAB ORIENT from 07/14/2024 in [facility] for Heart, Vascular, & Lung Health  Referring Provider Zenaida Morene PARAS, MD    Initial Encounter Date:  Flowsheet Row INTENSIVE CARDIAC REHAB ORIENT from 07/14/2024 in [facility] for Heart, Vascular, & Lung Health  Date 07/14/24    Visit Diagnosis: Heart failure, chronic systolic (HCC)  Patient's Home Medications on Admission:  Current Outpatient Medications:    FARXIGA  10 MG TABS tablet, TAKE 1 TABLET BY MOUTH DAILY BEFORE BREAKFAST., Disp: 90 tablet, Rfl: 1   aspirin  EC 81 MG tablet, Take 1 tablet (81 mg total) by mouth daily., Disp: , Rfl:    furosemide  (LASIX ) 40 MG tablet, Take 2 tablets (80 mg total) by mouth daily., Disp: 60 tablet, Rfl: 11   losartan  (COZAAR ) 25 MG tablet, Take 1 tablet (25 mg total) by mouth daily. (Patient taking differently: Take 12.5 mg by mouth 2 (two) times daily. 1/2 tab twice a day), Disp: 90 tablet, Rfl: 3   MAGNESIUM  PO, Take 1 tablet by mouth 2 (two) times a week., Disp: , Rfl:    metoprolol  succinate (TOPROL -XL) 25 MG 24 hr tablet, Take 12.5 mg by mouth daily., Disp: , Rfl:    Omega-3 Fatty Acids (FISH OIL) 1000 MG CAPS, Take 1,000 mg by mouth 2 (two) times a week., Disp: , Rfl:    Probiotic Product (PROBIOTIC-10 PO), Take 1 tablet by mouth 2 (two) times a week., Disp: , Rfl:    UBIQUINONE PO, Take 100 mg by mouth 2 (two) times a week., Disp: , Rfl:    VITAMIN D  PO, Take 1 tablet by mouth 3 (three) times a week., Disp: , Rfl:    VITAMIN E PO, Take 1 tablet by mouth 3 (three) times a week., Disp: , Rfl:    zinc gluconate 50 MG tablet, Take 50 mg by mouth 2 (two) times a week., Disp: , Rfl:   Past Medical History: Past Medical History:  Diagnosis Date   Adjustment disorder with anxiety  12/05/2022   Allergy     Back pain    Bilateral primary osteoarthritis of knee 02/12/2023   Congestive heart failure (HCC) 09/2023   Depressive episode 11/01/2022   Dysphoric mood 12/14/2014   Dyspnea    Fibroid    Hyperglycemia 12/19/2015   Infertility, female    Influenza 12/23/2018   Insulin  resistance 10/08/2018   Lack of concentration 11/01/2022   Leg edema    Major depressive disorder, recurrent episode, moderate (HCC) 11/21/2022   Morbid obesity (HCC) 03/16/2014   OBESITY, UNSPECIFIED 04/06/2008   Qualifier: Diagnosis of   By: Antonio ROSALEA Rockers         Osteoarthritis    Other fatigue 12/08/2019   Other hyperlipidemia 10/08/2018   Pansinusitis 12/23/2018   Postpartum depression    Preeclampsia 03/16/2014   Preventative health care 11/01/2022   Seasonal affective disorder 12/05/2022   Sjogren's syndrome 10/28/2015   Thrombocytopenia, unspecified 03/16/2014   Urticaria due to food allergy  09/28/2012   Allergy  Profile 07/23/12- specific elevations for dust mite, dog dander, cockroach and some weed pollens.  Food IgG profile- elevated to all agents tested, as has been my experience with this test.   Allergy  Profile 09/15/12- Total IgE 536.8, specific elevations for orange, chicken, apple, shrimp, tuna  ANA 09/15/12- Pos speckled 1:160  ESR  23        Vitamin B12 deficiency 11/01/2022   Vitamin D  deficiency    Xerophthalmia 04/15/2023   Xerostomia 02/12/2023    Tobacco Use: Social History   Tobacco Use  Smoking Status Never   Passive exposure: Current  Smokeless Tobacco Never    Labs: Review Flowsheet  More data exists      Latest Ref Rng & Units 05/12/2020 11/01/2022 12/23/2023 03/17/2024 06/12/2024  Labs for ITP Cardiac and Pulmonary Rehab  Cholestrol 0 - 200 mg/dL 847  813  861  877  -  LDL (calc) 0 - 99 mg/dL 92  883  84  65  -  HDL-C >39.00 mg/dL 43  60.99  37  62.89  -  Trlycerides 0.0 - 149.0 mg/dL 90  844.9  89  03.9  -  Hemoglobin A1c 4.6 - 6.5 % 5.5  5.2  -  5.8  -  PH, Arterial 7.35 - 7.45 7.35 - 7.45 - - - - 7.391  7.414   PCO2 arterial 32 - 48 mmHg 32 - 48 mmHg - - - - 44.9  44.8   Bicarbonate 20.0 - 28.0 mmol/L 20.0 - 28.0 mmol/L - - - - 27.2  28.6   TCO2 22 - 32 mmol/L 22 - 32 mmol/L - - - - 29  30   O2 Saturation % % - - - - 65  69     Details       Multiple values from one day are sorted in reverse-chronological order         Capillary Blood Glucose: No results found for: GLUCAP   Exercise Target Goals: Exercise Program Goal: Individual exercise prescription set using results from initial 6 min walk test and THRR while considering  patient's activity barriers and safety.   Exercise Prescription Goal: Initial exercise prescription builds to 30-45 minutes a day of aerobic activity, 2-3 days per week.  Home exercise guidelines will be given to patient during program as part of exercise prescription that the participant will acknowledge.  Activity Barriers & Risk Stratification:  Activity Barriers & Cardiac Risk Stratification - 07/14/24 1334       Activity Barriers & Cardiac Risk Stratification   Activity Barriers Decreased Ventricular Function;Shortness of Breath;Other (comment)    Comments Right foot surgery in 2022, currently in PT.    Cardiac Risk Stratification High          6 Minute Walk:  6 Minute Walk     Row Name 07/14/24 1454         6 Minute Walk   Phase Initial     Distance 1068 feet     Walk Time 6 minutes     # of Rest Breaks 2     MPH 2.02     METS 2.71     RPE 15     Perceived Dyspnea  2.5     VO2 Peak 9.49     Symptoms Yes (comment)     Comments Shortness of breath, mild with difficulty.     Resting HR 84 bpm     Resting BP 94/60     Resting Oxygen Saturation  99 %     Exercise Oxygen Saturation  during 6 min walk 100 %     Max Ex. HR 133 bpm     Max Ex. BP 108/70     2 Minute Post BP 100/68        Oxygen Initial Assessment:   Oxygen  Re-Evaluation:   Oxygen Discharge  (Final Oxygen Re-Evaluation):   Initial Exercise Prescription:  Initial Exercise Prescription - 07/14/24 1500       Date of Initial Exercise RX and Referring Provider   Date 07/14/24    Referring Provider Zenaida Morene PARAS, MD    Expected Discharge Date 10/07/24      Recumbant Bike   Level 1    RPM 25    Watts 15    Minutes 15    METs 1.6      NuStep   Level 1    SPM 85    Minutes 15    METs 2      Prescription Details   Frequency (times per week) 3    Duration Progress to 30 minutes of continuous aerobic without signs/symptoms of physical distress      Intensity   THRR 40-80% of Max Heartrate 68-136    Ratings of Perceived Exertion 11-13    Perceived Dyspnea 0-4      Progression   Progression Continue to progress workloads to maintain intensity without signs/symptoms of physical distress.      Resistance Training   Training Prescription Yes    Weight 5 lbs    Reps 10-15          Perform Capillary Blood Glucose checks as needed.  Exercise Prescription Changes:   Exercise Prescription Changes     Row Name 07/20/24 1627 08/21/24 1701           Response to Exercise   Blood Pressure (Admit) 92/62 94/70      Blood Pressure (Exercise) 138/82 --      Blood Pressure (Exit) 96/64 96/64      Heart Rate (Admit) 92 bpm 98 bpm      Heart Rate (Exercise) 141 bpm 146 bpm      Heart Rate (Exit) 101 bpm 106 bpm      Rating of Perceived Exertion (Exercise) 11 11      Perceived Dyspnea (Exercise) 0 0      Symptoms None None      Comments Pt first day in the [facility] program Reviewed MET's and goals      Duration Progress to 30 minutes of  aerobic without signs/symptoms of physical distress Progress to 30 minutes of  aerobic without signs/symptoms of physical distress      Intensity THRR unchanged THRR unchanged        Progression   Progression Continue to progress workloads to maintain intensity without signs/symptoms of physical distress. Continue to  progress workloads to maintain intensity without signs/symptoms of physical distress.      Average METs 2.6 3.25        Resistance Training   Training Prescription Yes Yes      Weight 5 lbs 6 lbs      Reps 10-15 10-15      Time 10 Minutes 10 Minutes        Recumbant Bike   Level 1 3      RPM 79 82      Watts 13 51      Minutes 15 15      METs 2.1 2.9        NuStep   Level 1 2      SPM 112 110      Minutes 15 15      METs 3.1 3.6         Exercise Comments:   Exercise Comments  Row Name 07/20/24 1632 08/21/24 1706         Exercise Comments Pt first day in the [facility] program. Pt tolerated exercise well with an average MET level of 2.6. She is off to a good start and is learning her THRR, RPE and ExRx Reviewed MET's and goals. Pt tolerated exercise well with an average MET level of 3.25. She is doign well and progressing WLs and MET's. She is starting to reach her THRR. Will reahc out about increases in [facility] for Dr. office. She is already getting better control of her SOB and is gaining mroe cardio endurance. Overall she's doing well and feels well. Will go over home ExRx soon, she has a memebership to O2 and likes walking         Exercise Goals and Review:   Exercise Goals     Row Name 07/14/24 1325             Exercise Goals   Increase Physical Activity Yes       Intervention Provide advice, education, support and counseling about physical activity/exercise needs.;Develop an individualized exercise prescription for aerobic and resistive training based on initial evaluation findings, risk stratification, comorbidities and participant's personal goals.       Expected Outcomes Short Term: Attend rehab on a regular basis to increase amount of physical activity.;Long Term: Exercising regularly at least 3-5 days a week.;Long Term: Add in home exercise to make exercise part of routine and to increase amount of physical activity.       Increase Strength and Stamina Yes        Intervention Provide advice, education, support and counseling about physical activity/exercise needs.;Develop an individualized exercise prescription for aerobic and resistive training based on initial evaluation findings, risk stratification, comorbidities and participant's personal goals.       Expected Outcomes Short Term: Increase workloads from initial exercise prescription for resistance, speed, and METs.;Short Term: Perform resistance training exercises routinely during rehab and add in resistance training at home;Long Term: Improve cardiorespiratory fitness, muscular endurance and strength as measured by increased METs and functional capacity ( )       Able to understand and use rate of perceived exertion (RPE) scale Yes       Intervention Provide education and explanation on how to use RPE scale       Expected Outcomes Short Term: Able to use RPE daily in rehab to express subjective intensity level;Long Term:  Able to use RPE to guide intensity level when exercising independently       Knowledge and understanding of Target Heart Rate Range (THRR) Yes       Intervention Provide education and explanation of THRR including how the numbers were predicted and where they are located for reference       Expected Outcomes Short Term: Able to state/look up THRR;Long Term: Able to use THRR to govern intensity when exercising independently;Short Term: Able to use daily as guideline for intensity in rehab       Understanding of Exercise Prescription Yes       Intervention Provide education, explanation, and written materials on patient's individual exercise prescription       Expected Outcomes Short Term: Able to explain program exercise prescription;Long Term: Able to explain home exercise prescription to exercise independently          Exercise Goals Re-Evaluation :  Exercise Goals Re-Evaluation     Row Name 07/20/24 1630 08/21/24 1704  Exercise Goal Re-Evaluation   Exercise  Goals Review Increase Physical Activity;Understanding of Exercise Prescription;Increase Strength and Stamina;Knowledge and understanding of Target Heart Rate Range (THRR);Able to understand and use rate of perceived exertion (RPE) scale Increase Physical Activity;Understanding of Exercise Prescription;Increase Strength and Stamina;Knowledge and understanding of Target Heart Rate Range (THRR);Able to understand and use rate of perceived exertion (RPE) scale      Comments Pt first day in the [facility] program. Pt tolerated exercise well with an average MET level of 2.6. She is off to a good start and is learning her THRR, RPE and ExRx. Slightly over THRR, will let her know on her net visit. Remained asymptomatic Reviewed MET's and goals. Pt tolerated exercise well with an average MET level of 3.25. She is doign well and progressing WLs and MET's. She is starting to reach her THRR. Will reahc out about increases in [facility] for Dr. office. She is already getting better control of her SOB and is gaining mroe cardio endurance. Overall she's doing well and feels well. Will go over home ExRx soon, she has a memebership to O2 and likes walking      Expected Outcomes Will continue to monitor patient and progress workloads as tolerated without sign or symptom Will continue to monitor patient and progress workloads as tolerated without sign or symptom         Discharge Exercise Prescription (Final Exercise Prescription Changes):  Exercise Prescription Changes - 08/21/24 1701       Response to Exercise   Blood Pressure (Admit) 94/70    Blood Pressure (Exit) 96/64    Heart Rate (Admit) 98 bpm    Heart Rate (Exercise) 146 bpm    Heart Rate (Exit) 106 bpm    Rating of Perceived Exertion (Exercise) 11    Perceived Dyspnea (Exercise) 0    Symptoms None    Comments Reviewed MET's and goals    Duration Progress to 30 minutes of  aerobic without signs/symptoms of physical distress    Intensity THRR unchanged       Progression   Progression Continue to progress workloads to maintain intensity without signs/symptoms of physical distress.    Average METs 3.25      Resistance Training   Training Prescription Yes    Weight 6 lbs    Reps 10-15    Time 10 Minutes      Recumbant Bike   Level 3    RPM 82    Watts 51    Minutes 15    METs 2.9      NuStep   Level 2    SPM 110    Minutes 15    METs 3.6          Nutrition:  Target Goals: Understanding of nutrition guidelines, daily intake of sodium 1500mg , cholesterol 200mg , calories 30% from fat and 7% or less from saturated fats, daily to have 5 or more servings of fruits and vegetables.  Biometrics:  Pre Biometrics - 07/14/24 1254       Pre Biometrics   Waist Circumference 53 inches    Hip Circumference 60 inches    Waist to Hip Ratio 0.88 %    Triceps Skinfold 48 mm    % Body Fat 54.2 %    Grip Strength 33 kg    Flexibility 17.88 in    Single Leg Stand 30 seconds           Nutrition Therapy Plan and Nutrition Goals:   Nutrition  Assessments:  Nutrition Assessments - 07/22/24 1632       Rate Your Plate Scores   Pre Score 64         MEDIFICTS Score Key: >=70 Need to make dietary changes  40-70 Heart Healthy Diet <= 40 Therapeutic Level Cholesterol Diet   Flowsheet Row INTENSIVE CARDIAC REHAB from 07/22/2024 in [facility] for Heart, Vascular, & Lung Health  Picture Your Plate Total Score on Admission 64   Picture Your Plate Scores: <59 Unhealthy dietary pattern with much room for improvement. 41-50 Dietary pattern unlikely to meet recommendations for good health and room for improvement. 51-60 More healthful dietary pattern, with some room for improvement.  >60 Healthy dietary pattern, although there may be some specific behaviors that could be improved.    Nutrition Goals Re-Evaluation:   Nutrition Goals Re-Evaluation:   Nutrition Goals Discharge (Final Nutrition Goals  Re-Evaluation):   Psychosocial: Target Goals: Acknowledge presence or absence of significant depression and/or stress, maximize coping skills, provide positive support system. Participant is able to verbalize types and ability to use techniques and skills needed for reducing stress and depression.  Initial Review & Psychosocial Screening:  Initial Psych Review & Screening - 07/14/24 1329       Initial Review   Current issues with Current Sleep Concerns      Family Dynamics   Good Support System? Yes    Comments Elysse has excellent support from her mother, boyfriend, best friend, and systems analyst.      Barriers   Psychosocial barriers to participate in program There are no identifiable barriers or psychosocial needs.      Screening Interventions   Interventions Encouraged to exercise;Provide feedback about the scores to participant    Expected Outcomes Short Term goal: Identification and review with participant of any Quality of Life or Depression concerns found by scoring the questionnaire.;Long Term goal: The participant improves quality of Life and PHQ9 Scores as seen by post scores and/or verbalization of changes          Quality of Life Scores:  Quality of Life - 07/14/24 1328       Quality of Life   Select Quality of Life      Quality of Life Scores   Health/Function Pre 17.79 %    Socioeconomic Pre 23.17 %    Psych/Spiritual Pre 20.58 %    Family Pre 20 %    GLOBAL Pre 19.72 %         Scores of 19 and below usually indicate a poorer quality of life in these areas.  A difference of  2-3 points is a clinically meaningful difference.  A difference of 2-3 points in the total score of the Quality of Life Index has been associated with significant improvement in overall quality of life, self-image, physical symptoms, and general health in studies assessing change in quality of life.  PHQ-9: Review Flowsheet  More data exists      07/14/2024 03/17/2024 01/07/2023  11/01/2022 11/01/2020  Depression screen PHQ 2/9  Decreased Interest 1 0  3 1  Down, Depressed, Hopeless 0 1  3 1   PHQ - 2 Score 1 1  6 2   Altered sleeping 2 -  3 -  Tired, decreased energy 3 -  1 -  Change in appetite 0 -  3 -  Feeling bad or failure about yourself  0 -  - -  Trouble concentrating 1 -  0 -  Moving slowly or  fidgety/restless 0 -  0 -  Suicidal thoughts 0 -  - -  PHQ-9 Score 7 -  13 -  Difficult doing work/chores Somewhat difficult - - Extremely dIfficult -    Details       Information is confidential and restricted. Go to Review Flowsheets to unlock data.        Interpretation of Total Score  Total Score Depression Severity:  1-4 = Minimal depression, 5-9 = Mild depression, 10-14 = Moderate depression, 15-19 = Moderately severe depression, 20-27 = Severe depression   Psychosocial Evaluation and Intervention:   Psychosocial Re-Evaluation:  Psychosocial Re-Evaluation     Row Name 07/29/24 1059 08/25/24 1122           Psychosocial Re-Evaluation   Current issues with Current Sleep Concerns Current Sleep Concerns      Comments Nathanel has not voiced any increased concerns or stressors during exercise at cardiac rehab Quality of life questionnaire reviewed.Toshua denies being depressed. Cyra says that she continues to have low energy levels and shortness of breath. but has noted some improvement since she has been participating in cardiac rehab. Yailyn says she is frustrated as she is active and has struggled to loose weight. Odaliz says she has previously consulted with a nutrition management program that provided few results. Will ask regisitered  dietitian to speak with the patient      Expected Outcomes Afsheen will have decreased or controlled stress upon completion of cardiac rehab Ahlana will have decreased or controlled stress upon completion of cardiac rehab      Interventions Encouraged to attend Cardiac Rehabilitation for the exercise;Stress  management education;Relaxation education Encouraged to attend Cardiac Rehabilitation for the exercise;Stress management education;Relaxation education      Continue Psychosocial Services  Follow up required by staff Follow up required by staff         Psychosocial Discharge (Final Psychosocial Re-Evaluation):  Psychosocial Re-Evaluation - 08/25/24 1122       Psychosocial Re-Evaluation   Current issues with Current Sleep Concerns    Comments Quality of life questionnaire reviewed.Aylah denies being depressed. Trachelle says that she continues to have low energy levels and shortness of breath. but has noted some improvement since she has been participating in cardiac rehab. Jaleyah says she is frustrated as she is active and has struggled to loose weight. Ula says she has previously consulted with a nutrition management program that provided few results. Will ask regisitered  dietitian to speak with the patient    Expected Outcomes Aalaya will have decreased or controlled stress upon completion of cardiac rehab    Interventions Encouraged to attend Cardiac Rehabilitation for the exercise;Stress management education;Relaxation education    Continue Psychosocial Services  Follow up required by staff          Vocational Rehabilitation: Provide vocational rehab assistance to qualifying candidates.   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 No vocational rehab needs.          Education: Education Goals: Education classes will be provided on a weekly basis, covering required topics. Participant will state understanding/return demonstration of topics presented.    Education     Row Name 07/20/24 1400     Education   Cardiac Education Topics  [facility]   Geographical Information Systems Officer Psychosocial   Psychosocial Workshop Healthy Sleep for a Healthy Heart   Instruction Review Code 1- Teaching Laboratory Technician Start Time 1400   Class Stop Time 1445   Class Time Calculation (min) 45 min    Row Name 07/22/24 1300     Education   Cardiac Education Topics [facility]   Set Designer Dietitian;Respiratory Therapist   Weekly Topic Simple Sides and Sauces   Instruction Review Code 1- Verbalizes Understanding   Class Start Time 1400   Class Stop Time 1435   Class Time Calculation (min) 35 min    Row Name 07/24/24 1400     Education   Cardiac Education Topics [facility]   Nurse, Children's Exercise Physiologist   Select Psychosocial   Psychosocial How Our Thoughts Can Heal Our Hearts   Instruction Review Code 1- Verbalizes Understanding   Class Start Time 1400   Class Stop Time 1436   Class Time Calculation (min) 36 min    Row Name 07/27/24 1400     Education   Cardiac Education Topics [facility]   Hospital Doctor Education   General Education Heart Disease Risk Reduction   Instruction Review Code 1- Verbalizes Understanding   Class Start Time 1410   Class Stop Time 1450   Class Time Calculation (min) 40 min    Row Name 07/29/24 1500     Cooking Scientist, Research (physical Sciences)   Weekly Topic Powerhouse Plant-Based Proteins   Instruction Review Code 1- Verbalizes Understanding   Class Start Time 1400   Class Stop Time 1436   Class Time Calculation (min) 36 min    Row Name 07/31/24 1400     Education   Cardiac Education Topics [facility]   Licensed Conveyancer Nutrition   Nutrition Facts on Fat   Instruction Review Code 1- Verbalizes Understanding   Class Start Time 1357   Class  Stop Time 1436   Class Time Calculation (min) 39 min    Row Name 08/03/24 1400     Education   Cardiac Education Topics [facility]   Geographical Information Systems Officer Psychosocial   Psychosocial Workshop From Head to Heart: The Power of a Healthy Outlook   Instruction Review Code 1- Verbalizes Understanding   Class Start Time 1400   Class Stop Time 1446   Class Time Calculation (min) 46 min    Row Name 08/05/24 1400     Education   Cardiac Education Topics [facility]   Customer Service Manager   Weekly Topic Tasty Appetizers and Snacks   Instruction Review Code 1- Verbalizes Understanding   Class Start Time 1400   Class Stop Time 1442   Class Time Calculation (min) 42 min    Row Name 08/07/24 1400     Education   Cardiac Education Topics [facility]   Select Workshops     Workshops   Educator Nurse   Select Exercise   Exercise Workshop Managing Heart Disease: Your Path to a Healthier Heart   Instruction  Review Code 1- Verbalizes Understanding   Class Start Time 1405   Class Stop Time 1453   Class Time Calculation (min) 48 min    Row Name 08/17/24 1400     Education   Cardiac Education Topics [facility]   Geographical Information Systems Officer Exercise   Exercise Workshop Location Manager and Fall Prevention   Instruction Review Code 1- Verbalizes Understanding   Class Start Time 1408   Class Stop Time 1500   Class Time Calculation (min) 52 min    Row Name 08/19/24 1500     Education   Cardiac Education Topics Pritikin   Customer Service Manager   Weekly Topic Fast and Healthy Breakfasts   Instruction Review Code 1- Verbalizes Understanding   Class Start Time 1400   Class Stop Time 1442   Class Time Calculation (min) 42 min    Row Name 08/21/24 1400     Education   Cardiac Education Topics Pritikin    Licensed Conveyancer Nutrition   Nutrition Other  Label Reading   Instruction Review Code 1- Verbalizes Understanding   Class Start Time 1358   Class Stop Time 1456   Class Time Calculation (min) 58 min    Row Name 08/24/24 1400     Education   Cardiac Education Topics Pritikin   Hospital Doctor Education   General Education Metabolic Syndrome and Belly Fat   Instruction Review Code 1- Verbalizes Understanding   Class Start Time 1407   Class Stop Time 1445   Class Time Calculation (min) 38 min      Core Videos: Exercise    Move It!  Clinical staff conducted group or individual video education with verbal and written material and guidebook.  Patient learns the recommended Pritikin exercise program. Exercise with the goal of living a long, healthy life. Some of the health benefits of exercise include controlled diabetes, healthier blood pressure levels, improved cholesterol levels, improved heart and lung capacity, improved sleep, and better body composition. Everyone should speak with their doctor before starting or changing an exercise routine.  Biomechanical Limitations Clinical staff conducted group or individual video education with verbal and written material and guidebook.  Patient learns how biomechanical limitations can impact exercise and how we can mitigate and possibly overcome limitations to have an impactful and balanced exercise routine.  Body Composition Clinical staff conducted group or individual video education with verbal and written material and guidebook.  Patient learns that body composition (ratio of muscle mass to fat mass) is a key component to assessing overall fitness, rather than body weight alone. Increased fat mass, especially visceral belly fat, can put us  at increased risk for metabolic syndrome, type 2 diabetes, heart disease, and  even death. It is recommended to combine diet and exercise (cardiovascular and resistance training) to improve your body composition. Seek guidance from your physician and exercise physiologist before implementing an exercise routine.  Exercise Action Plan Clinical staff conducted group or individual video education with verbal and written material and guidebook.  Patient learns the recommended strategies to achieve and enjoy long-term exercise adherence, including variety, self-motivation, self-efficacy, and positive decision making. Benefits of exercise include fitness, good health, weight management, more energy, better sleep,  less stress, and overall well-being.  Medical   Heart Disease Risk Reduction Clinical staff conducted group or individual video education with verbal and written material and guidebook.  Patient learns our heart is our most vital organ as it circulates oxygen, nutrients, white blood cells, and hormones throughout the entire body, and carries waste away. Data supports a plant-based eating plan like the Pritikin Program for its effectiveness in slowing progression of and reversing heart disease. The video provides a number of recommendations to address heart disease.   Metabolic Syndrome and Belly Fat  Clinical staff conducted group or individual video education with verbal and written material and guidebook.  Patient learns what metabolic syndrome is, how it leads to heart disease, and how one can reverse it and keep it from coming back. You have metabolic syndrome if you have 3 of the following 5 criteria: abdominal obesity, high blood pressure, high triglycerides, low HDL cholesterol, and high blood sugar.  Hypertension and Heart Disease Clinical staff conducted group or individual video education with verbal and written material and guidebook.  Patient learns that high blood pressure, or hypertension, is very common in the [place] . Hypertension is largely due to  excessive salt intake, but other important risk factors include being overweight, physical inactivity, drinking too much alcohol, smoking, and not eating enough potassium from fruits and vegetables. High blood pressure is a leading risk factor for heart attack, stroke, congestive heart failure, dementia, kidney failure, and premature death. Long-term effects of excessive salt intake include stiffening of the arteries and thickening of heart muscle and organ damage. Recommendations include ways to reduce hypertension and the risk of heart disease.  Diseases of Our Time - Focusing on Diabetes Clinical staff conducted group or individual video education with verbal and written material and guidebook.  Patient learns why the best way to stop diseases of our time is prevention, through food and other lifestyle changes. Medicine (such as prescription pills and surgeries) is often only a Band-Aid on the problem, not a long-term solution. Most common diseases of our time include obesity, type 2 diabetes, hypertension, heart disease, and cancer. The Pritikin Program is recommended and has been proven to help reduce, reverse, and/or prevent the damaging effects of metabolic syndrome.  Nutrition   Overview of the Pritikin Eating Plan  Clinical staff conducted group or individual video education with verbal and written material and guidebook.  Patient learns about the Pritikin Eating Plan for disease risk reduction. The Pritikin Eating Plan emphasizes a wide variety of unrefined, minimally-processed carbohydrates, like fruits, vegetables, whole grains, and legumes. Go, Caution, and Stop food choices are explained. Plant-based and lean animal proteins are emphasized. Rationale provided for low sodium intake for blood pressure control, low added sugars for blood sugar stabilization, and low added fats and oils for coronary artery disease risk reduction and weight management.  Calorie Density  Clinical staff conducted  group or individual video education with verbal and written material and guidebook.  Patient learns about calorie density and how it impacts the Pritikin Eating Plan. Knowing the characteristics of the food you choose will help you decide whether those foods will lead to weight gain or weight loss, and whether you want to consume more or less of them. Weight loss is usually a side effect of the Pritikin Eating Plan because of its focus on low calorie-dense foods.  Label Reading  Clinical staff conducted group or individual video education with verbal and written material and guidebook.  Patient learns about  the Pritikin recommended label reading guidelines and corresponding recommendations regarding calorie density, added sugars, sodium content, and whole grains.  Dining Out - Part 1  Clinical staff conducted group or individual video education with verbal and written material and guidebook.  Patient learns that restaurant meals can be sabotaging because they can be so high in calories, fat, sodium, and/or sugar. Patient learns recommended strategies on how to positively address this and avoid unhealthy pitfalls.  Facts on Fats  Clinical staff conducted group or individual video education with verbal and written material and guidebook.  Patient learns that lifestyle modifications can be just as effective, if not more so, as many medications for lowering your risk of heart disease. A Pritikin lifestyle can help to reduce your risk of inflammation and atherosclerosis (cholesterol build-up, or plaque, in the artery walls). Lifestyle interventions such as dietary choices and physical activity address the cause of atherosclerosis. A review of the types of fats and their impact on blood cholesterol levels, along with dietary recommendations to reduce fat intake is also included.  Nutrition Action Plan  Clinical staff conducted group or individual video education with verbal and written material and  guidebook.  Patient learns how to incorporate Pritikin recommendations into their lifestyle. Recommendations include planning and keeping personal health goals in mind as an important part of their success.  Healthy Mind-Set    Healthy Minds, Bodies, Hearts  Clinical staff conducted group or individual video education with verbal and written material and guidebook.  Patient learns how to identify when they are stressed. Video will discuss the impact of that stress, as well as the many benefits of stress management. Patient will also be introduced to stress management techniques. The way we think, act, and feel has an impact on our hearts.  How Our Thoughts Can Heal Our Hearts  Clinical staff conducted group or individual video education with verbal and written material and guidebook.  Patient learns that negative thoughts can cause depression and anxiety. This can result in negative lifestyle behavior and serious health problems. Cognitive behavioral therapy is an effective method to help control our thoughts in order to change and improve our emotional outlook.  Additional Videos:  Exercise    Improving Performance  Clinical staff conducted group or individual video education with verbal and written material and guidebook.  Patient learns to use a non-linear approach by alternating intensity levels and lengths of time spent exercising to help burn more calories and lose more body fat. Cardiovascular exercise helps improve heart health, metabolism, hormonal balance, blood sugar control, and recovery from fatigue. Resistance training improves strength, endurance, balance, coordination, reaction time, metabolism, and muscle mass. Flexibility exercise improves circulation, posture, and balance. Seek guidance from your physician and exercise physiologist before implementing an exercise routine and learn your capabilities and proper form for all exercise.  Introduction to Yoga  Clinical staff  conducted group or individual video education with verbal and written material and guidebook.  Patient learns about yoga, a discipline of the coming together of mind, breath, and body. The benefits of yoga include improved flexibility, improved range of motion, better posture and core strength, increased lung function, weight loss, and positive self-image. Yoga's heart health benefits include lowered blood pressure, healthier heart rate, decreased cholesterol and triglyceride levels, improved immune function, and reduced stress. Seek guidance from your physician and exercise physiologist before implementing an exercise routine and learn your capabilities and proper form for all exercise.  Medical   Aging: Enhancing Your Quality of  Life  Clinical staff conducted group or individual video education with verbal and written material and guidebook.  Patient learns key strategies and recommendations to stay in good physical health and enhance quality of life, such as prevention strategies, having an advocate, securing a Health Care Proxy and Power of Attorney, and keeping a list of medications and system for tracking them. It also discusses how to avoid risk for bone loss.  Biology of Weight Control  Clinical staff conducted group or individual video education with verbal and written material and guidebook.  Patient learns that weight gain occurs because we consume more calories than we burn (eating more, moving less). Even if your body weight is normal, you may have higher ratios of fat compared to muscle mass. Too much body fat puts you at increased risk for cardiovascular disease, heart attack, stroke, type 2 diabetes, and obesity-related cancers. In addition to exercise, following the Pritikin Eating Plan can help reduce your risk.  Decoding Lab Results  Clinical staff conducted group or individual video education with verbal and written material and guidebook.  Patient learns that lab test reflects one  measurement whose values change over time and are influenced by many factors, including medication, stress, sleep, exercise, food, hydration, pre-existing medical conditions, and more. It is recommended to use the knowledge from this video to become more involved with your lab results and evaluate your numbers to speak with your doctor.   Diseases of Our Time - Overview  Clinical staff conducted group or individual video education with verbal and written material and guidebook.  Patient learns that according to the CDC, 50% to 70% of chronic diseases (such as obesity, type 2 diabetes, elevated lipids, hypertension, and heart disease) are avoidable through lifestyle improvements including healthier food choices, listening to satiety cues, and increased physical activity.  Sleep Disorders Clinical staff conducted group or individual video education with verbal and written material and guidebook.  Patient learns how good quality and duration of sleep are important to overall health and well-being. Patient also learns about sleep disorders and how they impact health along with recommendations to address them, including discussing with a physician.  Nutrition  Dining Out - Part 2 Clinical staff conducted group or individual video education with verbal and written material and guidebook.  Patient learns how to plan ahead and communicate in order to maximize their dining experience in a healthy and nutritious manner. Included are recommended food choices based on the type of restaurant the patient is visiting.   Fueling a Banker conducted group or individual video education with verbal and written material and guidebook.  There is a strong connection between our food choices and our health. Diseases like obesity and type 2 diabetes are very prevalent and are in large-part due to lifestyle choices. The Pritikin Eating Plan provides plenty of food and hunger-curbing satisfaction. It is  easy to follow, affordable, and helps reduce health risks.  Menu Workshop  Clinical staff conducted group or individual video education with verbal and written material and guidebook.  Patient learns that restaurant meals can sabotage health goals because they are often packed with calories, fat, sodium, and sugar. Recommendations include strategies to plan ahead and to communicate with the manager, chef, or server to help order a healthier meal.  Planning Your Eating Strategy  Clinical staff conducted group or individual video education with verbal and written material and guidebook.  Patient learns about the Pritikin Eating Plan and its benefit of reducing  the risk of disease. The Pritikin Eating Plan does not focus on calories. Instead, it emphasizes high-quality, nutrient-rich foods. By knowing the characteristics of the foods, we choose, we can determine their calorie density and make informed decisions.  Targeting Your Nutrition Priorities  Clinical staff conducted group or individual video education with verbal and written material and guidebook.  Patient learns that lifestyle habits have a tremendous impact on disease risk and progression. This video provides eating and physical activity recommendations based on your personal health goals, such as reducing LDL cholesterol, losing weight, preventing or controlling type 2 diabetes, and reducing high blood pressure.  Vitamins and Minerals  Clinical staff conducted group or individual video education with verbal and written material and guidebook.  Patient learns different ways to obtain key vitamins and minerals, including through a recommended healthy diet. It is important to discuss all supplements you take with your doctor.   Healthy Mind-Set    Smoking Cessation  Clinical staff conducted group or individual video education with verbal and written material and guidebook.  Patient learns that cigarette smoking and tobacco addiction pose  a serious health risk which affects millions of people. Stopping smoking will significantly reduce the risk of heart disease, lung disease, and many forms of cancer. Recommended strategies for quitting are covered, including working with your doctor to develop a successful plan.  Culinary   Becoming a Set Designer conducted group or individual video education with verbal and written material and guidebook.  Patient learns that cooking at home can be healthy, cost-effective, quick, and puts them in control. Keys to cooking healthy recipes will include looking at your recipe, assessing your equipment needs, planning ahead, making it simple, choosing cost-effective seasonal ingredients, and limiting the use of added fats, salts, and sugars.  Cooking - Breakfast and Snacks  Clinical staff conducted group or individual video education with verbal and written material and guidebook.  Patient learns how important breakfast is to satiety and nutrition through the entire day. Recommendations include key foods to eat during breakfast to help stabilize blood sugar levels and to prevent overeating at meals later in the day. Planning ahead is also a key component.  Cooking - Educational Psychologist conducted group or individual video education with verbal and written material and guidebook.  Patient learns eating strategies to improve overall health, including an approach to cook more at home. Recommendations include thinking of animal protein as a side on your plate rather than center stage and focusing instead on lower calorie dense options like vegetables, fruits, whole grains, and plant-based proteins, such as beans. Making sauces in large quantities to freeze for later and leaving the skin on your vegetables are also recommended to maximize your experience.  Cooking - Healthy Salads and Dressing Clinical staff conducted group or individual video education with verbal and written  material and guidebook.  Patient learns that vegetables, fruits, whole grains, and legumes are the foundations of the Pritikin Eating Plan. Recommendations include how to incorporate each of these in flavorful and healthy salads, and how to create homemade salad dressings. Proper handling of ingredients is also covered. Cooking - Soups and [employer] - Soups and Desserts Clinical staff conducted group or individual video education with verbal and written material and guidebook.  Patient learns that Pritikin soups and desserts make for easy, nutritious, and delicious snacks and meal components that are low in sodium, fat, sugar, and calorie density, while high in vitamins, minerals,  and filling fiber. Recommendations include simple and healthy ideas for soups and desserts.   Overview     The Pritikin Solution Program Overview Clinical staff conducted group or individual video education with verbal and written material and guidebook.  Patient learns that the results of the Pritikin Program have been documented in more than 100 articles published in peer-reviewed journals, and the benefits include reducing risk factors for (and, in some cases, even reversing) high cholesterol, high blood pressure, type 2 diabetes, obesity, and more! An overview of the three key pillars of the Pritikin Program will be covered: eating well, doing regular exercise, and having a healthy mind-set.  WORKSHOPS  Exercise: Exercise Basics: Building Your Action Plan Clinical staff led group instruction and group discussion with PowerPoint presentation and patient guidebook. To enhance the learning environment the use of posters, models and videos may be added. At the conclusion of this workshop, patients will comprehend the difference between physical activity and exercise, as well as the benefits of incorporating both, into their routine. Patients will understand the FITT (Frequency, Intensity, Time, and Type) principle  and how to use it to build an exercise action plan. In addition, safety concerns and other considerations for exercise and cardiac rehab will be addressed by the presenter. The purpose of this lesson is to promote a comprehensive and effective weekly exercise routine in order to improve patients' overall level of fitness.   Managing Heart Disease: Your Path to a Healthier Heart Clinical staff led group instruction and group discussion with PowerPoint presentation and patient guidebook. To enhance the learning environment the use of posters, models and videos may be added.At the conclusion of this workshop, patients will understand the anatomy and physiology of the heart. Additionally, they will understand how Pritikin's three pillars impact the risk factors, the progression, and the management of heart disease.  The purpose of this lesson is to provide a high-level overview of the heart, heart disease, and how the Pritikin lifestyle positively impacts risk factors.  Exercise Biomechanics Clinical staff led group instruction and group discussion with PowerPoint presentation and patient guidebook. To enhance the learning environment the use of posters, models and videos may be added. Patients will learn how the structural parts of their bodies function and how these functions impact their daily activities, movement, and exercise. Patients will learn how to promote a neutral spine, learn how to manage pain, and identify ways to improve their physical movement in order to promote healthy living. The purpose of this lesson is to expose patients to common physical limitations that impact physical activity. Participants will learn practical ways to adapt and manage aches and pains, and to minimize their effect on regular exercise. Patients will learn how to maintain good posture while sitting, walking, and lifting.  Balance Training and Fall Prevention  Clinical staff led group instruction and group  discussion with PowerPoint presentation and patient guidebook. To enhance the learning environment the use of posters, models and videos may be added. At the conclusion of this workshop, patients will understand the importance of their sensorimotor skills (vision, proprioception, and the vestibular system) in maintaining their ability to balance as they age. Patients will apply a variety of balancing exercises that are appropriate for their current level of function. Patients will understand the common causes for poor balance, possible solutions to these problems, and ways to modify their physical environment in order to minimize their fall risk. The purpose of this lesson is to teach patients about the importance of  maintaining balance as they age and ways to minimize their risk of falling.  WORKSHOPS   Nutrition:  Fueling a Ship Broker led group instruction and group discussion with PowerPoint presentation and patient guidebook. To enhance the learning environment the use of posters, models and videos may be added. Patients will review the foundational principles of the Pritikin Eating Plan and understand what constitutes a serving size in each of the food groups. Patients will also learn Pritikin-friendly foods that are better choices when away from home and review make-ahead meal and snack options. Calorie density will be reviewed and applied to three nutrition priorities: weight maintenance, weight loss, and weight gain. The purpose of this lesson is to reinforce (in a group setting) the key concepts around what patients are recommended to eat and how to apply these guidelines when away from home by planning and selecting Pritikin-friendly options. Patients will understand how calorie density may be adjusted for different weight management goals.  Mindful Eating  Clinical staff led group instruction and group discussion with PowerPoint presentation and patient guidebook. To enhance  the learning environment the use of posters, models and videos may be added. Patients will briefly review the concepts of the Pritikin Eating Plan and the importance of low-calorie dense foods. The concept of mindful eating will be introduced as well as the importance of paying attention to internal hunger signals. Triggers for non-hunger eating and techniques for dealing with triggers will be explored. The purpose of this lesson is to provide patients with the opportunity to review the basic principles of the Pritikin Eating Plan, discuss the value of eating mindfully and how to measure internal cues of hunger and fullness using the Hunger Scale. Patients will also discuss reasons for non-hunger eating and learn strategies to use for controlling emotional eating.  Targeting Your Nutrition Priorities Clinical staff led group instruction and group discussion with PowerPoint presentation and patient guidebook. To enhance the learning environment the use of posters, models and videos may be added. Patients will learn how to determine their genetic susceptibility to disease by reviewing their family history. Patients will gain insight into the importance of diet as part of an overall healthy lifestyle in mitigating the impact of genetics and other environmental insults. The purpose of this lesson is to provide patients with the opportunity to assess their personal nutrition priorities by looking at their family history, their own health history and current risk factors. Patients will also be able to discuss ways of prioritizing and modifying the Pritikin Eating Plan for their highest risk areas  Menu  Clinical staff led group instruction and group discussion with PowerPoint presentation and patient guidebook. To enhance the learning environment the use of posters, models and videos may be added. Using menus brought in from [employer], or printed from [employer], patients will apply the Pritikin dining out  guidelines that were presented in the [employer] video. Patients will also be able to practice these guidelines in a variety of provided scenarios. The purpose of this lesson is to provide patients with the opportunity to practice hands-on learning of the Pritikin Dining Out guidelines with actual menus and practice scenarios.  Label Reading Clinical staff led group instruction and group discussion with PowerPoint presentation and patient guidebook. To enhance the learning environment the use of posters, models and videos may be added. Patients will review and discuss the Pritikin label reading guidelines presented in [employer]'s Label Reading Educational series video. Using fool labels brought  in from local grocery stores and markets, patients will apply the label reading guidelines and determine if the packaged food meet the Pritikin guidelines. The purpose of this lesson is to provide patients with the opportunity to review, discuss, and practice hands-on learning of the Pritikin Label Reading guidelines with actual packaged food labels. Cooking School  Pritikin's [employer] are designed to teach patients ways to prepare quick, simple, and affordable recipes at home. The importance of nutrition's role in chronic disease risk reduction is reflected in its emphasis in the overall Pritikin program. By learning how to prepare essential core Pritikin Eating Plan recipes, patients will increase control over what they eat; be able to customize the flavor of foods without the use of added salt, sugar, or fat; and improve the quality of the food they consume. By learning a set of core recipes which are easily assembled, quickly prepared, and affordable, patients are more likely to prepare more healthy foods at home. These workshops focus on convenient breakfasts, simple entres, side dishes, and desserts which can be prepared with minimal effort and are consistent with nutrition  recommendations for cardiovascular risk reduction. Cooking [employer] are taught by a armed forces logistics/support/administrative officer (RD) who has been trained by the [employer]. The chef or RD has a clear understanding of the importance of minimizing - if not completely eliminating - added fat, sugar, and sodium in recipes. Throughout the series of Cooking School Workshop sessions, patients will learn about healthy ingredients and efficient methods of cooking to build confidence in their capability to prepare    Cooking School weekly topics:  Adding Flavor- Sodium-Free  Fast and Healthy Breakfasts  Powerhouse Plant-Based Proteins  Satisfying Salads and Dressings  Simple Sides and Sauces  International Cuisine-Spotlight on the [employer] Zones  Delicious Desserts  Savory Soups  [employer] - Meals in a Astronomer Appetizers and Snacks  Comforting Weekend Breakfasts  One-Pot Wonders   Fast Evening Meals  Landscape Architect Your Pritikin Plate  WORKSHOPS   Healthy Mindset (Psychosocial):  Focused Goals, Sustainable Changes Clinical staff led group instruction and group discussion with PowerPoint presentation and patient guidebook. To enhance the learning environment the use of posters, models and videos may be added. Patients will be able to apply effective goal setting strategies to establish at least one personal goal, and then take consistent, meaningful action toward that goal. They will learn to identify common barriers to achieving personal goals and develop strategies to overcome them. Patients will also gain an understanding of how our mind-set can impact our ability to achieve goals and the importance of cultivating a positive and growth-oriented mind-set. The purpose of this lesson is to provide patients with a deeper understanding of how to set and achieve personal goals, as well as the tools and strategies needed to overcome common obstacles which may arise along  the way.  From Head to Heart: The Power of a Healthy Outlook  Clinical staff led group instruction and group discussion with PowerPoint presentation and patient guidebook. To enhance the learning environment the use of posters, models and videos may be added. Patients will be able to recognize and describe the impact of emotions and mood on physical health. They will discover the importance of self-care and explore self-care practices which may work for them. Patients will also learn how to utilize the 4 C's to cultivate a healthier outlook and better manage stress and challenges. The purpose of this lesson is  to demonstrate to patients how a healthy outlook is an essential part of maintaining good health, especially as they continue their cardiac rehab journey.  Healthy Sleep for a Healthy Heart Clinical staff led group instruction and group discussion with PowerPoint presentation and patient guidebook. To enhance the learning environment the use of posters, models and videos may be added. At the conclusion of this workshop, patients will be able to demonstrate knowledge of the importance of sleep to overall health, well-being, and quality of life. They will understand the symptoms of, and treatments for, common sleep disorders. Patients will also be able to identify daytime and nighttime behaviors which impact sleep, and they will be able to apply these tools to help manage sleep-related challenges. The purpose of this lesson is to provide patients with a general overview of sleep and outline the importance of quality sleep. Patients will learn about a few of the most common sleep disorders. Patients will also be introduced to the concept of "sleep hygiene," and discover ways to self-manage certain sleeping problems through simple daily behavior changes. Finally, the workshop will motivate patients by clarifying the links between quality sleep and their goals of heart-healthy living.   Recognizing and  Reducing Stress Clinical staff led group instruction and group discussion with PowerPoint presentation and patient guidebook. To enhance the learning environment the use of posters, models and videos may be added. At the conclusion of this workshop, patients will be able to understand the types of stress reactions, differentiate between acute and chronic stress, and recognize the impact that chronic stress has on their health. They will also be able to apply different coping mechanisms, such as reframing negative self-talk. Patients will have the opportunity to practice a variety of stress management techniques, such as deep abdominal breathing, progressive muscle relaxation, and/or guided imagery.  The purpose of this lesson is to educate patients on the role of stress in their lives and to provide healthy techniques for coping with it.  Learning Barriers/Preferences:  Learning Barriers/Preferences - 07/14/24 1419       Learning Barriers/Preferences   Learning Barriers None    Learning Preferences Audio;Computer/Internet;Group Instruction;Individual Instruction;Pictoral;Skilled Demonstration;Verbal Instruction;Video;Written Material          Education Topics:  Knowledge Questionnaire Score:  Knowledge Questionnaire Score - 07/14/24 1559       Knowledge Questionnaire Score   Pre Score 24/24          Core Components/Risk Factors/Patient Goals at Admission:  Personal Goals and Risk Factors at Admission - 07/14/24 1433       Core Components/Risk Factors/Patient Goals on Admission    Weight Management Yes;Obesity;Weight Loss    Intervention Weight Management/Obesity: Establish reasonable short term and long term weight goals.;Obesity: Provide education and appropriate resources to help participant work on and attain dietary goals.    Admit Weight 304 lb 0.2 oz (137.9 kg)    Expected Outcomes Short Term: Continue to assess and modify interventions until short term weight is  achieved;Long Term: Adherence to nutrition and physical activity/exercise program aimed toward attainment of established weight goal;Weight Loss: Understanding of general recommendations for a balanced deficit meal plan, which promotes 1-2 lb weight loss per week and includes a negative energy balance of [phone] kcal/d    Heart Failure Yes    Intervention Provide a combined exercise and nutrition program that is supplemented with education, support and counseling about heart failure. Directed toward relieving symptoms such as shortness of breath, decreased exercise tolerance, and extremity edema.  Expected Outcomes Improve functional capacity of life;Short term: Attendance in program 2-3 days a week with increased exercise capacity. Reported lower sodium intake. Reported increased fruit and vegetable intake. Reports medication compliance.;Short term: Daily weights obtained and reported for increase. Utilizing diuretic protocols set by physician.;Long term: Adoption of self-care skills and reduction of barriers for early signs and symptoms recognition and intervention leading to self-care maintenance.    Lipids Yes    Intervention Provide education and support for participant on nutrition & aerobic/resistive exercise along with prescribed medications to achieve LDL 70mg , HDL >40mg .    Expected Outcomes Short Term: Participant states understanding of desired cholesterol values and is compliant with medications prescribed. Participant is following exercise prescription and nutrition guidelines.;Long Term: Cholesterol controlled with medications as prescribed, with individualized exercise RX and with personalized nutrition plan. Value goals: LDL < 70mg , HDL > 40 mg.          Core Components/Risk Factors/Patient Goals Review:   Goals and Risk Factor Review     Row Name 07/29/24 1103 08/25/24 1125           Core Components/Risk Factors/Patient Goals Review   Personal Goals Review Weight  Management/Obesity;Heart Failure;Lipids Weight Management/Obesity;Heart Failure;Lipids      Review Jackquelyn started cardiac rehab on 07/20/24 and is off to a good start to exercise. Vital signs have been stable. [place] is doing well with exercise at cardiac rehab. Vital signs have been stable. Marnette has gained 1.1 kg since starting the program      Expected Outcomes Saraiah will continue to participate in cardiac rehab for exercise, nutrition and lifestyle modifications. Sumeya will continue to participate in cardiac rehab for exercise, nutrition and lifestyle modifications.         Core Components/Risk Factors/Patient Goals at Discharge (Final Review):   Goals and Risk Factor Review - 08/25/24 1125       Core Components/Risk Factors/Patient Goals Review   Personal Goals Review Weight Management/Obesity;Heart Failure;Lipids    Review Monzerat is doing well with exercise at cardiac rehab. Vital signs have been stable. Jo has gained 1.1 kg since starting the program    Expected Outcomes Keerstin will continue to participate in cardiac rehab for exercise, nutrition and lifestyle modifications.          ITP Comments:  ITP Comments     Row Name 07/14/24 1325 07/29/24 1058 08/25/24 1119       ITP Comments Wilbert Bihari, MD: Medical Director. Introduction to the [employer] / Intensive Cardiac Rehab. Intial orientation packet reviewed with the patient. 30 Day ITP review.  Glynn started cardiac rehab on 07/20/24.  Kuulei did well with exercise 30 Day ITP review.  Krystalyn has good attendance and participation with exercise at  cardiac rehab        Comments: See ITP comments.Hadassah Elpidio Quan RN BSN

## 2024-08-26 ENCOUNTER — Encounter (HOSPITAL_COMMUNITY)
Admission: RE | Admit: 2024-08-26 | Discharge: 2024-08-26 | Disposition: A | Discharge status: Home / Self Care | Source: Ambulatory Visit | Attending: Cardiology | Admitting: Cardiology

## 2024-08-26 DIAGNOSIS — I5022 Chronic systolic (congestive) heart failure: Secondary | ICD-10-CM

## 2024-08-28 ENCOUNTER — Encounter (HOSPITAL_COMMUNITY): Admission: RE | Admit: 2024-08-28 | Source: Ambulatory Visit

## 2024-08-31 ENCOUNTER — Encounter (HOSPITAL_COMMUNITY)
Admission: RE | Admit: 2024-08-31 | Discharge: 2024-08-31 | Disposition: A | Discharge status: Home / Self Care | Source: Ambulatory Visit | Attending: Cardiology | Admitting: Cardiology

## 2024-08-31 DIAGNOSIS — I5022 Chronic systolic (congestive) heart failure: Secondary | ICD-10-CM

## 2024-09-02 ENCOUNTER — Encounter (HOSPITAL_COMMUNITY)
Admission: RE | Admit: 2024-09-02 | Discharge: 2024-09-02 | Disposition: A | Discharge status: Home / Self Care | Source: Ambulatory Visit | Attending: Cardiology | Admitting: Cardiology

## 2024-09-02 DIAGNOSIS — I471 Supraventricular tachycardia, unspecified: Secondary | ICD-10-CM

## 2024-09-02 DIAGNOSIS — I5022 Chronic systolic (congestive) heart failure: Secondary | ICD-10-CM | POA: Diagnosis not present

## 2024-09-02 DIAGNOSIS — I440 Atrioventricular block, first degree: Secondary | ICD-10-CM | POA: Diagnosis not present

## 2024-09-02 NOTE — Progress Notes (Signed)
 Stacey Huerta was noted to be in possible SVT for approximately 4 minutes during warm up stretches at cardiac rehab. Asymptomatic. Rate 140's to 150's spontaneously converted back to sinus rhythm heart rate 100 . Exercise stopped. Patient was taken into the treatment room and placed on Zoll. Blood pressure 117/79. Oxygen saturation 100% on room air. Today's ECG tracings shown  to onsite provider Miriam Shams NP. Medications reviewed. Taking as prescribed. Onsite provider Miriam Shams NP ordered 12 lead ECG. 12 Lead ECG obtained and reviewed by Kenzie Campell NP. Patient was instructed to decrease her furosemide  to 40 mg once a day. ED precautions discussed with patient by onsite provider. Patient was instructed to hold on continuing exercise at cardiac rehab until she is seen in the office by Dr Zenaida on 09/08/24. Patient states understanding and left cardiac rehab without further symptoms or complaints. Will forward today's ECG tracings to Dr Zenaida for review. Left message on the heart failure clinic about today's events. Hadassah Elpidio Quan RN BSN

## 2024-09-04 ENCOUNTER — Encounter (HOSPITAL_COMMUNITY)

## 2024-09-07 ENCOUNTER — Encounter (HOSPITAL_COMMUNITY)

## 2024-09-08 ENCOUNTER — Other Ambulatory Visit (HOSPITAL_COMMUNITY): Payer: Self-pay | Admitting: Cardiology

## 2024-09-08 ENCOUNTER — Ambulatory Visit (HOSPITAL_COMMUNITY)
Admission: RE | Admit: 2024-09-08 | Discharge: 2024-09-08 | Disposition: A | Source: Ambulatory Visit | Attending: Cardiology

## 2024-09-08 ENCOUNTER — Ambulatory Visit (HOSPITAL_COMMUNITY)
Admission: RE | Admit: 2024-09-08 | Discharge: 2024-09-08 | Disposition: A | Discharge status: Home / Self Care | Source: Ambulatory Visit | Attending: Cardiology | Admitting: Cardiology

## 2024-09-08 VITALS — BP 104/70 | HR 95 | Wt 301.0 lb

## 2024-09-08 DIAGNOSIS — I493 Ventricular premature depolarization: Secondary | ICD-10-CM | POA: Diagnosis not present

## 2024-09-08 DIAGNOSIS — Z8759 Personal history of other complications of pregnancy, childbirth and the puerperium: Secondary | ICD-10-CM | POA: Diagnosis not present

## 2024-09-08 DIAGNOSIS — I5022 Chronic systolic (congestive) heart failure: Secondary | ICD-10-CM | POA: Diagnosis not present

## 2024-09-08 DIAGNOSIS — M3500 Sicca syndrome, unspecified: Secondary | ICD-10-CM | POA: Diagnosis not present

## 2024-09-08 DIAGNOSIS — I440 Atrioventricular block, first degree: Secondary | ICD-10-CM | POA: Diagnosis not present

## 2024-09-08 DIAGNOSIS — E669 Obesity, unspecified: Secondary | ICD-10-CM | POA: Diagnosis not present

## 2024-09-08 DIAGNOSIS — Z79899 Other long term (current) drug therapy: Secondary | ICD-10-CM | POA: Insufficient documentation

## 2024-09-08 DIAGNOSIS — Z7984 Long term (current) use of oral hypoglycemic drugs: Secondary | ICD-10-CM | POA: Diagnosis not present

## 2024-09-08 DIAGNOSIS — I420 Dilated cardiomyopathy: Secondary | ICD-10-CM

## 2024-09-08 MED ORDER — METOPROLOL SUCCINATE ER 25 MG PO TB24: 12.5000 mg | ORAL_TABLET | Freq: Two times a day (BID) | ORAL | 3 refills | Status: AC

## 2024-09-08 NOTE — Patient Instructions (Signed)
 CHANGE Toprol  to 12.5 mg Twice daily  Your provider has recommended that  you wear a Zio Patch for 14 days.  This monitor will record your heart rhythm for our review.  IF you have any symptoms while wearing the monitor please press the button.  If you have any issues with the patch or you notice a red or orange light on it please call the company at [phone].  Once you remove the patch please mail it back to the company as soon as possible so we can get the results.   Your physician recommends that you schedule a follow-up appointment in: 3 months ( February 2026)** PLEASE CALL THE OFFICE IN DECEMBER TO ARRANGE YOUR FOLLOW UP APPOINTMENT.**  If you have any questions or concerns before your next appointment please send us  a message through [place] or call our office at [phone].    TO LEAVE A MESSAGE FOR THE NURSE SELECT OPTION 2, PLEASE LEAVE A MESSAGE INCLUDING: YOUR NAME DATE OF BIRTH CALL BACK NUMBER REASON FOR CALL**this is important as we prioritize the call backs  YOU WILL RECEIVE A CALL BACK THE SAME DAY AS LONG AS YOU CALL BEFORE 4:00 PM  At the [facility], you and your health needs are our priority. As part of our continuing mission to provide you with exceptional heart care, we have created designated Provider Care Teams. These Care Teams include your primary Cardiologist (physician) and Advanced Practice Providers (APPs- Physician Assistants and Nurse Practitioners) who all work together to provide you with the care you need, when you need it.   You may see any of the following providers on your designated Care Team at your next follow up: Dr Toribio Fuel Dr Ezra Shuck Dr. Morene Brownie Greig Mosses, NP Caffie Shed, [place] [facility] [place], [place] Beckey Coe, NP Jordan Lee, NP Ellouise Class, NP Tinnie Redman, PharmD Jaun Bash, PharmD   Please be sure to bring in all your medications bottles to every appointment.    Thank  you for choosing [facility]

## 2024-09-08 NOTE — Progress Notes (Signed)
 ADVANCED HEART FAILURE FOLLOW UP CLINIC NOTE  Referring Physician: Antonio Cyndee Jamee JONELLE, *  Primary Care: Antonio Cyndee Jamee JONELLE, DO Primary Cardiologist:  HPI: Stacey Huerta is a 48 y.o. female who presents for follow up of chronic systolic heart failure.      Established with Dr. Lonni 11/20/2023. She noted frequent syncopal spells in her 70s with cardiology workup including EKG/orthostatics unremarkable but unclear whether she had echocardiogram. In her 30s and 40s no recurrent syncope but did have hypotension. At age 35 had severe preeclampsia/HELLP syndrome in 6 weeks after delivery her blood pressure normalized.    Prior echocardiogram 2014 due to syncope LVEF 45-50%.    Echocardiogram 12/17/2023 LVEF less than 20%, LV global hypokinesis, LV moderate to severely dilated, RV SF moderately reduced, normal PASP, bilateral atria moderately dilated, moderate to severe MR. Based on results, Losartan  12.5mg  daily was initiated. She reported using Lasix  3 x per week when she felt profoundly bloated. Medication titration limited by relative hypotension.      SUBJECTIVE:  Doing much better now that she has been enrolled in cardiac rehab. Message sent to me the other day for evident episode of SVT while in rehab. She reports that for a few days prior she had been feeling poorly and had felt a little run down. She actually was feeling better the day of the episode and denies any issues with palptiations, lightheadedness, or dizziness during the episode. She otherwise has really been enjoying rehab and would like to continue.   PMH, current medications, allergies, social history, and family history reviewed in epic.  PHYSICAL EXAM: Vitals:   09/08/24 1026 09/08/24 1039  BP: 120/88 104/70  Pulse: 95   SpO2: 99%     GENERAL: NAD, well appearing PULM:  Normal work of breathing, CTAB CARDIAC:  JVP: flat         Normal rate with regular rhythm. No murmurs, rubs or gallops.   Trace edema. Warm and well perfused extremities. ABDOMEN: Soft, non-tender, non-distended. NEUROLOGIC: Patient is oriented x3 with no focal or lateralizing neurologic deficits.     DATA REVIEW  ECG: 05/19/2024: Sinus rhythm with significantly prolonged first-degree AV block   ECHO: 11/2023: LVEF less than 20%, moderate to severe dilation, RV moderately reduced   CATH: [facility] 05/2024: RA 8, PA 45/22 (31), PCWP 22, TD CO/CI 4.73/1.89   PET: 05/05/2024: Normal LV perfusion, rest ejection fraction 13%, abnormal myocardial blood flow  ASSESSMENT & PLAN:  Chronic systolic heart failure: Unclear etiology, recent cardiac PET scan shows no evidence of ischemia. Cardiac MRI ordered, but it appears they tried to schedule the test while in [place]. Repeat order placed. NYHA class IIB symptoms that have improved with cardiac rehab. Discussion today about SVT episode, but denies any recent palpitations or symptoms worsening. Would recommend continuing rehab, will place Zio patch monitor as below.   - CMR ordered to evaluate for infiltrative disease and scar burden - Continue farixga 10mg  daily - Increase metoprolol  to 12.5mg  BID - Contiue losartan  25mg  nightly - Holding off MRA at this visit, start next time - Continue lasix  80mg  daily, briefly dropped to 40mg  but more swelling - Can resume cardiac rehab from my standpoint - Low threshold to move towards advanced therapies if worsening  SVT: Noted during cardiac rehab, though not captured on monitor. High baseline heart rate likely due to advanced LV disease. - Zio patch monitor placed - Appears euvolemic and better compensated, increase BB as above   Obesity:  Longstanding, wegovy  was denied. - Has lost a significant amount of weight, congratulated - Cardiac rehab ordered - Would greatly benefit from GLP-1  Follow up in 2 months  I spent 43 minutes caring for this patient today including face to face time, ordering and reviewing labs,  reviewing records from cardiac rehab, seeing the patient, documenting in the record, and arranging follow ups.   Morene Brownie, MD Advanced Heart Failure Mechanical Circulatory Support 09/11/24

## 2024-09-09 ENCOUNTER — Encounter (HOSPITAL_COMMUNITY)
Admission: RE | Admit: 2024-09-09 | Discharge: 2024-09-09 | Disposition: A | Source: Ambulatory Visit | Attending: Cardiology

## 2024-09-09 DIAGNOSIS — I5022 Chronic systolic (congestive) heart failure: Secondary | ICD-10-CM

## 2024-09-11 ENCOUNTER — Encounter (HOSPITAL_COMMUNITY)
Admission: RE | Admit: 2024-09-11 | Discharge: 2024-09-11 | Disposition: A | Discharge status: Home / Self Care | Source: Ambulatory Visit | Attending: Cardiology | Admitting: Cardiology

## 2024-09-11 DIAGNOSIS — I5022 Chronic systolic (congestive) heart failure: Secondary | ICD-10-CM

## 2024-09-14 ENCOUNTER — Encounter (HOSPITAL_COMMUNITY)

## 2024-09-14 ENCOUNTER — Encounter (HOSPITAL_COMMUNITY): Payer: Self-pay

## 2024-09-14 ENCOUNTER — Telehealth (HOSPITAL_COMMUNITY): Payer: Self-pay

## 2024-09-14 NOTE — Telephone Encounter (Signed)
 Patient c/o for 1:45 CR class, states she is not feeling well. Confirmed she does not have cold/flu symptoms.

## 2024-09-16 ENCOUNTER — Other Ambulatory Visit (HOSPITAL_COMMUNITY): Payer: Self-pay | Admitting: Cardiology

## 2024-09-16 ENCOUNTER — Ambulatory Visit (HOSPITAL_COMMUNITY)
Admission: RE | Admit: 2024-09-16 | Discharge: 2024-09-16 | Disposition: A | Discharge status: Home / Self Care | Source: Ambulatory Visit | Attending: Cardiology | Admitting: Cardiology

## 2024-09-16 ENCOUNTER — Encounter (HOSPITAL_COMMUNITY)
Admission: RE | Admit: 2024-09-16 | Discharge: 2024-09-16 | Disposition: A | Source: Ambulatory Visit | Attending: Cardiology

## 2024-09-16 DIAGNOSIS — I420 Dilated cardiomyopathy: Secondary | ICD-10-CM | POA: Diagnosis present

## 2024-09-16 DIAGNOSIS — I5022 Chronic systolic (congestive) heart failure: Secondary | ICD-10-CM

## 2024-09-16 MED ORDER — GADOBUTROL 1 MMOL/ML IV SOLN
10.0000 mL | Freq: Once | INTRAVENOUS | Status: AC | PRN
  Administered 2024-09-16: 10 mL via INTRAVENOUS

## 2024-09-18 ENCOUNTER — Encounter (HOSPITAL_COMMUNITY)

## 2024-09-21 ENCOUNTER — Encounter (HOSPITAL_COMMUNITY)
Admission: RE | Admit: 2024-09-21 | Discharge: 2024-09-21 | Disposition: A | Source: Ambulatory Visit | Attending: Cardiology

## 2024-09-21 DIAGNOSIS — I471 Supraventricular tachycardia, unspecified: Secondary | ICD-10-CM | POA: Diagnosis present

## 2024-09-21 DIAGNOSIS — I5022 Chronic systolic (congestive) heart failure: Secondary | ICD-10-CM | POA: Insufficient documentation

## 2024-09-22 NOTE — Progress Notes (Signed)
 Cardiac Individual Treatment Plan  Patient Details  Name: Stacey Huerta MRN: [id number] Date of Birth: 27-Jun-1976 Referring Provider:   Flowsheet Row INTENSIVE CARDIAC REHAB ORIENT from 07/14/2024 in [facility]  Referring Provider Zenaida Morene PARAS, MD    Initial Encounter Date:  Flowsheet Row INTENSIVE CARDIAC REHAB ORIENT from 07/14/2024 in [facility]  Date 07/14/24    Visit Diagnosis: Heart failure, chronic systolic (HCC)  SVT (supraventricular tachycardia)  Patient's Home Medications on Admission:  Current Outpatient Medications:    aspirin  EC 81 MG tablet, Take 1 tablet (81 mg total) by mouth daily., Disp: , Rfl:    FARXIGA  10 MG TABS tablet, TAKE 1 TABLET BY MOUTH DAILY BEFORE BREAKFAST., Disp: 90 tablet, Rfl: 1   furosemide  (LASIX ) 40 MG tablet, Take 2 tablets (80 mg total) by mouth daily., Disp: 60 tablet, Rfl: 11   losartan  (COZAAR ) 25 MG tablet, Take 1 tablet (25 mg total) by mouth daily., Disp: 90 tablet, Rfl: 3   MAGNESIUM  PO, Take 1 tablet by mouth 2 (two) times a week., Disp: , Rfl:    metoprolol  succinate (TOPROL -XL) 25 MG 24 hr tablet, Take 0.5 tablets (12.5 mg total) by mouth 2 (two) times daily., Disp: 90 tablet, Rfl: 3   Omega-3 Fatty Acids (FISH OIL) 1000 MG CAPS, Take 1,000 mg by mouth 2 (two) times a week., Disp: , Rfl:    Probiotic Product (PROBIOTIC-10 PO), Take 1 tablet by mouth 2 (two) times a week., Disp: , Rfl:    UBIQUINONE PO, Take 100 mg by mouth 2 (two) times a week., Disp: , Rfl:    VITAMIN D  PO, Take 1 tablet by mouth 3 (three) times a week., Disp: , Rfl:    VITAMIN E PO, Take 1 tablet by mouth 3 (three) times a week., Disp: , Rfl:    zinc gluconate 50 MG tablet, Take 50 mg by mouth 2 (two) times a week., Disp: , Rfl:   Past Medical History: Past Medical History:  Diagnosis Date   Adjustment disorder with anxiety 12/05/2022    Allergy     Back pain    Bilateral primary osteoarthritis of knee 02/12/2023   Congestive heart failure (HCC) 09/2023   Depressive episode 11/01/2022   Dysphoric mood 12/14/2014   Dyspnea    Fibroid    Hyperglycemia 12/19/2015   Infertility, female    Influenza 12/23/2018   Insulin  resistance 10/08/2018   Lack of concentration 11/01/2022   Leg edema    Major depressive disorder, recurrent episode, moderate (HCC) 11/21/2022   Morbid obesity (HCC) 03/16/2014   OBESITY, UNSPECIFIED 04/06/2008   Qualifier: Diagnosis of   By: Antonio ROSALEA Rockers         Osteoarthritis    Other fatigue 12/08/2019   Other hyperlipidemia 10/08/2018   Pansinusitis 12/23/2018   Postpartum depression    Preeclampsia 03/16/2014   Preventative health care 11/01/2022   Seasonal affective disorder 12/05/2022   Sjogren's syndrome 10/28/2015   Thrombocytopenia, unspecified 03/16/2014   Urticaria due to food allergy  09/28/2012   Allergy  Profile 07/23/12- specific elevations for dust mite, dog dander, cockroach and some weed pollens.  Food IgG profile- elevated to all agents tested, as has been my experience with this test.   Allergy  Profile 09/15/12- Total IgE 536.8, specific elevations for orange, chicken, apple, shrimp, tuna  ANA 09/15/12- Pos speckled 1:160  ESR 23  Vitamin B12 deficiency 11/01/2022   Vitamin D  deficiency    Xerophthalmia 04/15/2023   Xerostomia 02/12/2023    Tobacco Use: Social History   Tobacco Use  Smoking Status Never   Passive exposure: Current  Smokeless Tobacco Never    Labs: Review Flowsheet  More data exists      Latest Ref Rng & Units 05/12/2020 11/01/2022 12/23/2023 03/17/2024 06/12/2024  Labs for ITP Cardiac and Pulmonary Rehab  Cholestrol 0 - 200 mg/dL 847  813  861  877  -  LDL (calc) 0 - 99 mg/dL 92  883  84  65  -  HDL-C >39.00 mg/dL 43  60.99  37  62.89  -  Trlycerides 0.0 - 149.0 mg/dL 90  844.9  89  03.9  -  Hemoglobin A1c 4.6 - 6.5 % 5.5  5.2  - 5.8  -  PH,  Arterial 7.35 - 7.45 7.35 - 7.45 - - - - 7.391  7.414   PCO2 arterial 32 - 48 mmHg 32 - 48 mmHg - - - - 44.9  44.8   Bicarbonate 20.0 - 28.0 mmol/L 20.0 - 28.0 mmol/L - - - - 27.2  28.6   TCO2 22 - 32 mmol/L 22 - 32 mmol/L - - - - 29  30   O2 Saturation % % - - - - 65  69     Details       Multiple values from one day are sorted in reverse-chronological order         Capillary Blood Glucose: No results found for: GLUCAP   Exercise Target Goals: Exercise Program Goal: Individual exercise prescription set using results from initial 6 min walk test and THRR while considering  patient's activity barriers and safety.   Exercise Prescription Goal: Initial exercise prescription builds to 30-45 minutes a day of aerobic activity, 2-3 days per week.  Home exercise guidelines will be given to patient during program as part of exercise prescription that the participant will acknowledge.  Activity Barriers & Risk Stratification:  Activity Barriers & Cardiac Risk Stratification - 07/14/24 1334       Activity Barriers & Cardiac Risk Stratification   Activity Barriers Decreased Ventricular Function;Shortness of Breath;Other (comment)    Comments Right foot surgery in 2022, currently in PT.    Cardiac Risk Stratification High          6 Minute Walk:  6 Minute Walk     Row Name 07/14/24 1454         6 Minute Walk   Phase Initial     Distance 1068 feet     Walk Time 6 minutes     # of Rest Breaks 2     MPH 2.02     METS 2.71     RPE 15     Perceived Dyspnea  2.5     VO2 Peak 9.49     Symptoms Yes (comment)     Comments Shortness of breath, mild with difficulty.     Resting HR 84 bpm     Resting BP 94/60     Resting Oxygen Saturation  99 %     Exercise Oxygen Saturation  during 6 min walk 100 %     Max Ex. HR 133 bpm     Max Ex. BP 108/70     2 Minute Post BP 100/68        Oxygen Initial Assessment:   Oxygen Re-Evaluation:   Oxygen Discharge (Final Oxygen  Re-Evaluation):   Initial Exercise Prescription:  Initial Exercise Prescription - 07/14/24 1500       Date of Initial Exercise RX and Referring Provider   Date 07/14/24    Referring Provider Zenaida Morene PARAS, MD    Expected Discharge Date 10/07/24      Recumbant Bike   Level 1    RPM 25    Watts 15    Minutes 15    METs 1.6      NuStep   Level 1    SPM 85    Minutes 15    METs 2      Prescription Details   Frequency (times per week) 3    Duration Progress to 30 minutes of continuous aerobic without signs/symptoms of physical distress      Intensity   THRR 40-80% of Max Heartrate 68-136    Ratings of Perceived Exertion 11-13    Perceived Dyspnea 0-4      Progression   Progression Continue to progress workloads to maintain intensity without signs/symptoms of physical distress.      Resistance Training   Training Prescription Yes    Weight 5 lbs    Reps 10-15          Perform Capillary Blood Glucose checks as needed.  Exercise Prescription Changes:   Exercise Prescription Changes     Row Name 07/20/24 1627 08/21/24 1701 09/09/24 1705         Response to Exercise   Blood Pressure (Admit) 92/62 94/70 100/66     Blood Pressure (Exercise) 138/82 -- --     Blood Pressure (Exit) 96/64 96/64 96/60      Heart Rate (Admit) 92 bpm 98 bpm 99 bpm     Heart Rate (Exercise) 141 bpm 146 bpm 142 bpm     Heart Rate (Exit) 101 bpm 106 bpm 92 bpm     Rating of Perceived Exertion (Exercise) 11 11 11      Perceived Dyspnea (Exercise) 0 0 0     Symptoms None None None     Comments Pt first day in the [facility] program Reviewed MET's and goals Reviewed MET's     Duration Progress to 30 minutes of  aerobic without signs/symptoms of physical distress Progress to 30 minutes of  aerobic without signs/symptoms of physical distress Progress to 30 minutes of  aerobic without signs/symptoms of physical distress     Intensity THRR unchanged THRR unchanged THRR unchanged        Progression   Progression Continue to progress workloads to maintain intensity without signs/symptoms of physical distress. Continue to progress workloads to maintain intensity without signs/symptoms of physical distress. Continue to progress workloads to maintain intensity without signs/symptoms of physical distress.     Average METs 2.6 3.25 3.23       Resistance Training   Training Prescription Yes Yes No     Weight 5 lbs 6 lbs 6 lbs     Reps 10-15 10-15 10-15     Time 10 Minutes 10 Minutes 10 Minutes       Recumbant Bike   Level 1 3 4      RPM 79 82 67     Watts 13 51 59     Minutes 15 15 15      METs 2.1 2.9 3.3       NuStep   Level 1 2 --     SPM 112 110 --     Minutes 15 15 --  METs 3.1 3.6 --        Exercise Comments:   Exercise Comments     Row Name 07/20/24 1632 08/21/24 1706 09/09/24 1721       Exercise Comments Pt first day in the [facility] program. Pt tolerated exercise well with an average MET level of 2.6. She is off to a good start and is learning her THRR, RPE and ExRx Reviewed MET's and goals. Pt tolerated exercise well with an average MET level of 3.25. She is doign well and progressing WLs and MET's. She is starting to reach her THRR. Will reahc out about increases in [facility] for Dr. office. She is already getting better control of her SOB and is gaining mroe cardio endurance. Overall she's doing well and feels well. Will go over home ExRx soon, she has a memebership to [employer] and likes walking Reviewed MET's and goals. Pt tolerated exercise well with an average MET level of 3.25. She has recieved her new THRR and is doing well with exercise. She did have a run of SVT last week. So she has a Zio patch for the next two weeks and an increase in her Metoprolol . Will wait to see results from Zio patch before going over home ExEx, but she does have a membership to [employer] and does enjoy walking        Exercise Goals and Review:   Exercise Goals     Row Name  07/14/24 1325             Exercise Goals   Increase Physical Activity Yes       Intervention Provide advice, education, support and counseling about physical activity/exercise needs.;Develop an individualized exercise prescription for aerobic and resistive training based on initial evaluation findings, risk stratification, comorbidities and participant's personal goals.       Expected Outcomes Short Term: Attend rehab on a regular basis to increase amount of physical activity.;Long Term: Exercising regularly at least 3-5 days a week.;Long Term: Add in home exercise to make exercise part of routine and to increase amount of physical activity.       Increase Strength and Stamina Yes       Intervention Provide advice, education, support and counseling about physical activity/exercise needs.;Develop an individualized exercise prescription for aerobic and resistive training based on initial evaluation findings, risk stratification, comorbidities and participant's personal goals.       Expected Outcomes Short Term: Increase workloads from initial exercise prescription for resistance, speed, and METs.;Short Term: Perform resistance training exercises routinely during rehab and add in resistance training at home;Long Term: Improve cardiorespiratory fitness, muscular endurance and strength as measured by increased METs and functional capacity ( )       Able to understand and use rate of perceived exertion (RPE) scale Yes       Intervention Provide education and explanation on how to use RPE scale       Expected Outcomes Short Term: Able to use RPE daily in rehab to express subjective intensity level;Long Term:  Able to use RPE to guide intensity level when exercising independently       Knowledge and understanding of Target Heart Rate Range (THRR) Yes       Intervention Provide education and explanation of THRR including how the numbers were predicted and where they are located for reference        Expected Outcomes Short Term: Able to state/look up THRR;Long Term: Able to use THRR to govern intensity when exercising independently;Short  Term: Able to use daily as guideline for intensity in rehab       Understanding of Exercise Prescription Yes       Intervention Provide education, explanation, and written materials on patient's individual exercise prescription       Expected Outcomes Short Term: Able to explain program exercise prescription;Long Term: Able to explain home exercise prescription to exercise independently          Exercise Goals Re-Evaluation :  Exercise Goals Re-Evaluation     Row Name 07/20/24 1630 08/21/24 1704 09/09/24 1717         Exercise Goal Re-Evaluation   Exercise Goals Review Increase Physical Activity;Understanding of Exercise Prescription;Increase Strength and Stamina;Knowledge and understanding of Target Heart Rate Range (THRR);Able to understand and use rate of perceived exertion (RPE) scale Increase Physical Activity;Understanding of Exercise Prescription;Increase Strength and Stamina;Knowledge and understanding of Target Heart Rate Range (THRR);Able to understand and use rate of perceived exertion (RPE) scale Increase Physical Activity;Understanding of Exercise Prescription;Increase Strength and Stamina;Knowledge and understanding of Target Heart Rate Range (THRR);Able to understand and use rate of perceived exertion (RPE) scale     Comments Pt first day in the [facility] program. Pt tolerated exercise well with an average MET level of 2.6. She is off to a good start and is learning her THRR, RPE and ExRx. Slightly over THRR, will let her know on her net visit. Remained asymptomatic Reviewed MET's and goals. Pt tolerated exercise well with an average MET level of 3.25. She is doign well and progressing WLs and MET's. She is starting to reach her THRR. Will reahc out about increases in [facility] for Dr. office. She is already getting better control of her SOB and is  gaining mroe cardio endurance. Overall she's doing well and feels well. Will go over home ExRx soon, she has a memebership to [employer] and likes walking Reviewed MET's and goals. Pt tolerated exercise well with an average MET level of 3.25. She has recieved her new THRR and is doing well with exercise. She did have a run of SVT last week. So she has a Zio patch for the next two weeks and an increase in her Metoprolol . Will wait to see results from Zio patch before going over home ExEx, but she does have a membership to [employer] and does enjoy walking     Expected Outcomes Will continue to monitor patient and progress workloads as tolerated without sign or symptom Will continue to monitor patient and progress workloads as tolerated without sign or symptom Will continue to monitor patient and progress workloads as tolerated without sign or symptom        Discharge Exercise Prescription (Final Exercise Prescription Changes):  Exercise Prescription Changes - 09/09/24 1705       Response to Exercise   Blood Pressure (Admit) 100/66    Blood Pressure (Exit) 96/60    Heart Rate (Admit) 99 bpm    Heart Rate (Exercise) 142 bpm    Heart Rate (Exit) 92 bpm    Rating of Perceived Exertion (Exercise) 11    Perceived Dyspnea (Exercise) 0    Symptoms None    Comments Reviewed MET's    Duration Progress to 30 minutes of  aerobic without signs/symptoms of physical distress    Intensity THRR unchanged      Progression   Progression Continue to progress workloads to maintain intensity without signs/symptoms of physical distress.    Average METs 3.23      Resistance Training  Training Prescription No    Weight 6 lbs    Reps 10-15    Time 10 Minutes      Recumbant Bike   Level 4    RPM 67    Watts 59    Minutes 15    METs 3.3          Nutrition:  Target Goals: Understanding of nutrition guidelines, daily intake of sodium 1500mg , cholesterol 200mg , calories 30% from fat and 7% or less from  saturated fats, daily to have 5 or more servings of fruits and vegetables.  Biometrics:  Pre Biometrics - 07/14/24 1254       Pre Biometrics   Waist Circumference 53 inches    Hip Circumference 60 inches    Waist to Hip Ratio 0.88 %    Triceps Skinfold 48 mm    % Body Fat 54.2 %    Grip Strength 33 kg    Flexibility 17.88 in    Single Leg Stand 30 seconds           Nutrition Therapy Plan and Nutrition Goals:  Nutrition Therapy & Goals - 08/31/24 1532       Nutrition Therapy   Diet Heart Healthy      Personal Nutrition Goals   Nutrition Goal Patient to identify strategies for reducing cardiovascular risk by attending the [facility] education and nutrition series weekly.    Personal Goal #2 Patient to improve diet quality by using the plate method as a guide for meal planning to include lean protein/plant protein, fruits, vegetables, whole grains, nonfat dairy as part of a well-balanced diet.    Personal Goal #3 Patient to identify strategies for weight loss with goal of 0.5-2 # per week of weight loss.    Comments Pt with history of chronic systolic heart failure. Pt reports working to maintain consistency in making healthy food choices in order to promote heart health as well as wt loss. Current BMI >40 kg/m2. Patient will benefit from participation in intensive cardiac rehab for nutrition education, exercise, and lifestyle modification.      Intervention Plan   Intervention Prescribe, educate and counsel regarding individualized specific dietary modifications aiming towards targeted core components such as weight, hypertension, lipid management, diabetes, heart failure and other comorbidities.    Expected Outcomes Short Term Goal: Understand basic principles of dietary content, such as calories, fat, sodium, cholesterol and nutrients.;Long Term Goal: Adherence to prescribed nutrition plan.          Nutrition Assessments:  Nutrition Assessments - 07/22/24 1632       Rate  Your Plate Scores   Pre Score 64         MEDIFICTS Score Key: >=70 Need to make dietary changes  40-70 Heart Healthy Diet <= 40 Therapeutic Level Cholesterol Diet   Flowsheet Row INTENSIVE CARDIAC REHAB from 07/22/2024 in [facility] for [facility], V[facility], & [facility]  Picture Your Plate Total Score on Admission 64   Picture Your Plate Scores: <59 Unhealthy dietary pattern with much room for improvement. 41-50 Dietary pattern unlikely to meet recommendations for good health and room for improvement. 51-60 More healthful dietary pattern, with some room for improvement.  >60 Healthy dietary pattern, although there may be some specific behaviors that could be improved.    Nutrition Goals Re-Evaluation:  Nutrition Goals Re-Evaluation     Row Name 09/21/24 1521             Goals   Current Weight  309 lb 1.4 oz (140.2 kg)       Nutrition Goal Patient to identify strategies for reducing cardiovascular risk by attending the [facility] education and nutrition series weekly.       Expected Outcome Goals in action. Pt with history of chronic systolic heart failure. Non-significant wt gain of 1.4% noted over past 2 months. Pt reports difficulty making healthy food choices due to lack of time available for meal preparation. Describes lunch as largest meal; typically consists of salad, juice, or low sodium soup. Pt verbalizes importance of not overly restricting caloric intake. RD provided suggestions for easy meal prep such as pre-cooked frozen rice, using pre-chopped fruit/veggies, using no salt added canned lentils. Pt endorses having more time on weekends to prepare meals. RD encouraged pt to prepare meal such as soup on weekend and freeze in individual containers to use throughout the week. Patient will benefit from ongoing participation in intensive cardiac rehab for nutrition education, exercise, and lifestyle modification.         Personal Goal #2 Re-Evaluation    Personal Goal #2 Patient to improve diet quality by using the plate method as a guide for meal planning to include lean protein/plant protein, fruits, vegetables, whole grains, nonfat dairy as part of a well-balanced diet.         Personal Goal #3 Re-Evaluation   Personal Goal #3 Patient to identify strategies for weight loss with goal of 0.5-2 # per week of weight loss.          Nutrition Goals Re-Evaluation:  Nutrition Goals Re-Evaluation     Row Name 09/21/24 1521             Goals   Current Weight 309 lb 1.4 oz (140.2 kg)       Nutrition Goal Patient to identify strategies for reducing cardiovascular risk by attending the [facility] education and nutrition series weekly.       Expected Outcome Goals in action. Pt with history of chronic systolic heart failure. Non-significant wt gain of 1.4% noted over past 2 months. Pt reports difficulty making healthy food choices due to lack of time available for meal preparation. Describes lunch as largest meal; typically consists of salad, juice, or low sodium soup. Pt verbalizes importance of not overly restricting caloric intake. RD provided suggestions for easy meal prep such as pre-cooked frozen rice, using pre-chopped fruit/veggies, using no salt added canned lentils. Pt endorses having more time on weekends to prepare meals. RD encouraged pt to prepare meal such as soup on weekend and freeze in individual containers to use throughout the week. Patient will benefit from ongoing participation in intensive cardiac rehab for nutrition education, exercise, and lifestyle modification.         Personal Goal #2 Re-Evaluation   Personal Goal #2 Patient to improve diet quality by using the plate method as a guide for meal planning to include lean protein/plant protein, fruits, vegetables, whole grains, nonfat dairy as part of a well-balanced diet.         Personal Goal #3 Re-Evaluation   Personal Goal #3 Patient to identify strategies for weight loss with  goal of 0.5-2 # per week of weight loss.          Nutrition Goals Discharge (Final Nutrition Goals Re-Evaluation):  Nutrition Goals Re-Evaluation - 09/21/24 1521       Goals   Current Weight 309 lb 1.4 oz (140.2 kg)    Nutrition Goal Patient to identify strategies for reducing cardiovascular risk  by attending the [facility] education and nutrition series weekly.    Expected Outcome Goals in action. Pt with history of chronic systolic heart failure. Non-significant wt gain of 1.4% noted over past 2 months. Pt reports difficulty making healthy food choices due to lack of time available for meal preparation. Describes lunch as largest meal; typically consists of salad, juice, or low sodium soup. Pt verbalizes importance of not overly restricting caloric intake. RD provided suggestions for easy meal prep such as pre-cooked frozen rice, using pre-chopped fruit/veggies, using no salt added canned lentils. Pt endorses having more time on weekends to prepare meals. RD encouraged pt to prepare meal such as soup on weekend and freeze in individual containers to use throughout the week. Patient will benefit from ongoing participation in intensive cardiac rehab for nutrition education, exercise, and lifestyle modification.      Personal Goal #2 Re-Evaluation   Personal Goal #2 Patient to improve diet quality by using the plate method as a guide for meal planning to include lean protein/plant protein, fruits, vegetables, whole grains, nonfat dairy as part of a well-balanced diet.      Personal Goal #3 Re-Evaluation   Personal Goal #3 Patient to identify strategies for weight loss with goal of 0.5-2 # per week of weight loss.          Psychosocial: Target Goals: Acknowledge presence or absence of significant depression and/or stress, maximize coping skills, provide positive support system. Participant is able to verbalize types and ability to use techniques and skills needed for reducing stress and  depression.  Initial Review & Psychosocial Screening:  Initial Psych Review & Screening - 07/14/24 1329       Initial Review   Current issues with Current Sleep Concerns      Family Dynamics   Good Support System? Yes    Comments Stacey Huerta has excellent support from her mother, boyfriend, best friend, and systems analyst.      Barriers   Psychosocial barriers to participate in program There are no identifiable barriers or psychosocial needs.      Screening Interventions   Interventions Encouraged to exercise;Provide feedback about the scores to participant    Expected Outcomes Short Term goal: Identification and review with participant of any Quality of Life or Depression concerns found by scoring the questionnaire.;Long Term goal: The participant improves quality of Life and PHQ9 Scores as seen by post scores and/or verbalization of changes          Quality of Life Scores:  Quality of Life - 07/14/24 1328       Quality of Life   Select Quality of Life      Quality of Life Scores   Health/Function Pre 17.79 %    Socioeconomic Pre 23.17 %    Psych/Spiritual Pre 20.58 %    Family Pre 20 %    GLOBAL Pre 19.72 %         Scores of 19 and below usually indicate a poorer quality of life in these areas.  A difference of  2-3 points is a clinically meaningful difference.  A difference of 2-3 points in the total score of the Quality of Life Index has been associated with significant improvement in overall quality of life, self-image, physical symptoms, and general health in studies assessing change in quality of life.  PHQ-9: Review Flowsheet  More data exists      07/14/2024 03/17/2024 01/07/2023 11/01/2022 11/01/2020  Depression screen PHQ 2/9  Decreased Interest 1 0  3 1  Down, Depressed, Hopeless 0 1  3 1   PHQ - 2 Score 1 1  6 2   Altered sleeping 2 -  3 -  Tired, decreased energy 3 -  1 -  Change in appetite 0 -  3 -  Feeling bad or failure about yourself  0 -  - -   Trouble concentrating 1 -  0 -  Moving slowly or fidgety/restless 0 -  0 -  Suicidal thoughts 0 -  - -  PHQ-9 Score 7  -  13  -  Difficult doing work/chores Somewhat difficult - - Extremely dIfficult -    Details       Information is confidential and restricted. Go to Review Flowsheets to unlock data.   Data saved with a previous flowsheet row definition        Interpretation of Total Score  Total Score Depression Severity:  1-4 = Minimal depression, 5-9 = Mild depression, 10-14 = Moderate depression, 15-19 = Moderately severe depression, 20-27 = Severe depression   Psychosocial Evaluation and Intervention:   Psychosocial Re-Evaluation:  Psychosocial Re-Evaluation     Row Name 07/29/24 1059 08/25/24 1122 09/22/24 1732         Psychosocial Re-Evaluation   Current issues with Current Sleep Concerns Current Sleep Concerns None Identified     Comments Stacey Huerta has not voiced any increased concerns or stressors during exercise at cardiac rehab Quality of life questionnaire reviewed.Stacey Huerta denies being depressed. Stacey Huerta says that she continues to have low energy levels and shortness of breath. but has noted some improvement since she has been participating in cardiac rehab. Stacey Huerta says she is frustrated as she is active and has struggled to loose weight. Stacey Huerta says she has previously consulted with a nutrition management program that provided few results. Will ask regisitered  dietitian to speak with the patient Stacey Huerta has not voiced any increased concerns or stressors during exercise at cardiac rehab.     Expected Outcomes Stacey Huerta will have decreased or controlled stress upon completion of cardiac rehab Stacey Huerta will have decreased or controlled stress upon completion of cardiac rehab Stacey Huerta will have decreased or controlled stress upon completion of cardiac rehab     Interventions Encouraged to attend Cardiac Rehabilitation for the exercise;Stress management education;Relaxation  education Encouraged to attend Cardiac Rehabilitation for the exercise;Stress management education;Relaxation education Encouraged to attend Cardiac Rehabilitation for the exercise;Stress management education;Relaxation education     Continue Psychosocial Services  Follow up required by staff Follow up required by staff Follow up required by staff        Psychosocial Discharge (Final Psychosocial Re-Evaluation):  Psychosocial Re-Evaluation - 09/22/24 1732       Psychosocial Re-Evaluation   Current issues with None Identified    Comments Stacey Huerta has not voiced any increased concerns or stressors during exercise at cardiac rehab.    Expected Outcomes Stacey Huerta will have decreased or controlled stress upon completion of cardiac rehab    Interventions Encouraged to attend Cardiac Rehabilitation for the exercise;Stress management education;Relaxation education    Continue Psychosocial Services  Follow up required by staff          Vocational Rehabilitation: Provide vocational rehab assistance to qualifying candidates.   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          Education: Education Goals: Education classes will be provided on a weekly basis, covering required topics. Participant will state understanding/return demonstration of topics presented.    Education     Row Name 07/20/24 1400     Education   Cardiac Education Topics Pritikin   Geographical Information Systems Officer Psychosocial   Psychosocial Workshop Healthy Sleep for a Healthy Heart   Instruction Review Code 1- Verbalizes Understanding   Class Start Time 1400   Class Stop Time 1445   Class Time Calculation (min)  45 min    Row Name 07/22/24 1300     Education   Cardiac Education Topics Pritikin   Orthoptist   Educator Dietitian;Respiratory Therapist   Weekly Topic Simple Sides and Sauces   Instruction Review Code 1- Verbalizes Understanding   Class Start Time 1400   Class Stop Time 1435   Class Time Calculation (min) 35 min    Row Name 07/24/24 1400     Education   Cardiac Education Topics Pritikin   Nurse, Children's Exercise Physiologist   Select Psychosocial   Psychosocial How Our Thoughts Can Heal Our Hearts   Instruction Review Code 1- Verbalizes Understanding   Class Start Time 1400   Class Stop Time 1436   Class Time Calculation (min) 36 min    Row Name 07/27/24 1400     Education   Cardiac Education Topics Pritikin   Hospital Doctor Education   General Education Heart Disease Risk Reduction   Instruction Review Code 1- Verbalizes Understanding   Class Start Time 1410   Class Stop Time 1450   Class Time Calculation (min) 40 min    Row Name 07/29/24 1500     Cooking Scientist, Research (physical Sciences)   Weekly Topic Powerhouse Plant-Based Proteins   Instruction Review Code 1- Verbalizes Understanding   Class Start Time 1400   Class Stop Time 1436   Class Time Calculation (min) 36 min    Row Name 07/31/24 1400     Education   Cardiac Education Topics Pritikin   Licensed Conveyancer Nutrition   Nutrition Facts on Fat   Instruction Review Code 1- Verbalizes Understanding   Class Start Time 1357   Class Stop Time 1436   Class Time Calculation (min) 39 min    Row Name 08/03/24 1400     Education   Cardiac Education Topics Pritikin   Geographical Information Systems Officer Psychosocial   Psychosocial Workshop From Head to Heart: The Power of a Healthy  Outlook   Instruction Review Code 1- Verbalizes Understanding   Class Start Time 1400   Class Stop Time 1446   Class Time Calculation (min) 46 min    Row Name 08/05/24 1400     Education   Cardiac Education Topics Pritikin   Customer Service Manager   Weekly Topic Tasty Appetizers and Snacks   Instruction Review Code 1- Verbalizes Understanding   Class Start Time 1400  Class Stop Time 1442   Class Time Calculation (min) 42 min    Row Name 08/07/24 1400     Education   Cardiac Education Topics Pritikin   Mining Engineer Exercise   Exercise Workshop Managing Heart Disease: Your Path to a Healthier Heart   Instruction Review Code 1- Verbalizes Understanding   Class Start Time 1405   Class Stop Time 1453   Class Time Calculation (min) 48 min    Row Name 08/17/24 1400     Education   Cardiac Education Topics Pritikin   Geographical Information Systems Officer Exercise   Exercise Workshop Location Manager and Fall Prevention   Instruction Review Code 1- Verbalizes Understanding   Class Start Time 1408   Class Stop Time 1500   Class Time Calculation (min) 52 min    Row Name 08/19/24 1500     Education   Cardiac Education Topics Pritikin   Customer Service Manager   Weekly Topic Fast and Healthy Breakfasts   Instruction Review Code 1- Verbalizes Understanding   Class Start Time 1400   Class Stop Time 1442   Class Time Calculation (min) 42 min    Row Name 08/21/24 1400     Education   Cardiac Education Topics Pritikin   Licensed Conveyancer Nutrition   Nutrition Other  Label Reading   Instruction Review Code 1- Verbalizes Understanding   Class Start Time 1358   Class Stop Time 1456   Class Time Calculation (min) 58 min    Row Name 08/24/24 1400      Education   Cardiac Education Topics Pritikin   Hospital Doctor Education   General Education Metabolic Syndrome and Belly Fat   Instruction Review Code 1- Verbalizes Understanding   Class Start Time 1407   Class Stop Time 1445   Class Time Calculation (min) 38 min    Row Name 08/26/24 1400     Education   Cardiac Education Topics Pritikin   Customer Service Manager   Weekly Topic Personalizing Your Pritikin Plate   Instruction Review Code 1- Verbalizes Understanding   Class Start Time 1358   Class Stop Time 1436   Class Time Calculation (min) 38 min    Row Name 08/31/24 1400     Education   Cardiac Education Topics Pritikin   Select Workshops     Workshops   Educator Exercise Physiologist   Select Psychosocial   Psychosocial Workshop Recognizing and Reducing Stress   Instruction Review Code 1- Verbalizes Understanding   Class Start Time 1359   Class Stop Time 1445   Class Time Calculation (min) 46 min    Row Name 09/02/24 1500     Education   Cardiac Education Topics Pritikin   Orthoptist   Educator Dietitian   Weekly Topic Delicious Desserts   Instruction Review Code 1- Verbalizes Understanding   Class Start Time 1400   Class Stop Time 1438   Class Time Calculation (min) 38 min    Row Name 09/09/24 1400     Education  Cardiac Education Topics Pritikin   Environmental Education Officer - Meals in a Snap   Instruction Review Code 1- Verbalizes Understanding   Class Start Time 1400   Class Stop Time 1444   Class Time Calculation (min) 44 min    Row Name 09/11/24 1400     Education   Cardiac Education Topics Pritikin   Select Workshops     Workshops   Educator Exercise Physiologist   Select Exercise   Exercise Workshop Exercise Basics:  Diplomatic Services Operational Officer   Instruction Review Code 1- Verbalizes Understanding   Class Start Time 1400   Class Stop Time 1452   Class Time Calculation (min) 52 min    Row Name 09/16/24 1300     Education   Cardiac Education Topics Pritikin   Customer Service Manager   Weekly Topic One-Pot Wonders   Instruction Review Code 1- Verbalizes Understanding   Class Start Time 1400   Class Stop Time 1445   Class Time Calculation (min) 45 min    Row Name 09/21/24 1400     Education   Cardiac Education Topics Pritikin   Geographical Information Systems Officer Psychosocial   Psychosocial Workshop Focused Goals, Sustainable Changes   Instruction Review Code 1- Verbalizes Understanding   Class Start Time 1355   Class Stop Time 1438   Class Time Calculation (min) 43 min    Row Name 09/21/24 1500     Education   Cardiac Education Topics --   Select --     Workshops   Educator --   Physiological Scientist --   Psychosocial Workshop --   Instruction Review Code --   Class Start Time --   Class Stop Time --   Class Time Calculation (min) --      Core Videos: Exercise    Move It!  Clinical staff conducted group or individual video education with verbal and written material and guidebook.  Patient learns the recommended Pritikin exercise program. Exercise with the goal of living a long, healthy life. Some of the health benefits of exercise include controlled diabetes, healthier blood pressure levels, improved cholesterol levels, improved heart and lung capacity, improved sleep, and better body composition. Everyone should speak with their doctor before starting or changing an exercise routine.  Biomechanical Limitations Clinical staff conducted group or individual video education with verbal and written material and guidebook.  Patient learns how biomechanical limitations can impact exercise and how we can mitigate and  possibly overcome limitations to have an impactful and balanced exercise routine.  Body Composition Clinical staff conducted group or individual video education with verbal and written material and guidebook.  Patient learns that body composition (ratio of muscle mass to fat mass) is a key component to assessing overall fitness, rather than body weight alone. Increased fat mass, especially visceral belly fat, can put us  at increased risk for metabolic syndrome, type 2 diabetes, heart disease, and even death. It is recommended to combine diet and exercise (cardiovascular and resistance training) to improve your body composition. Seek guidance from your physician and exercise physiologist before implementing an exercise routine.  Exercise Action Plan Clinical staff conducted group or individual video education with verbal and written material and guidebook.  Patient learns the recommended strategies to achieve and enjoy long-term exercise adherence,  including variety, self-motivation, self-efficacy, and positive decision making. Benefits of exercise include fitness, good health, weight management, more energy, better sleep, less stress, and overall well-being.  Medical   Heart Disease Risk Reduction Clinical staff conducted group or individual video education with verbal and written material and guidebook.  Patient learns our heart is our most vital organ as it circulates oxygen, nutrients, white blood cells, and hormones throughout the entire body, and carries waste away. Data supports a plant-based eating plan like the Pritikin Program for its effectiveness in slowing progression of and reversing heart disease. The video provides a number of recommendations to address heart disease.   Metabolic Syndrome and Belly Fat  Clinical staff conducted group or individual video education with verbal and written material and guidebook.  Patient learns what metabolic syndrome is, how it leads to heart disease,  and how one can reverse it and keep it from coming back. You have metabolic syndrome if you have 3 of the following 5 criteria: abdominal obesity, high blood pressure, high triglycerides, low HDL cholesterol, and high blood sugar.  Hypertension and Heart Disease Clinical staff conducted group or individual video education with verbal and written material and guidebook.  Patient learns that high blood pressure, or hypertension, is very common in the [place] . Hypertension is largely due to excessive salt intake, but other important risk factors include being overweight, physical inactivity, drinking too much alcohol, smoking, and not eating enough potassium from fruits and vegetables. High blood pressure is a leading risk factor for heart attack, stroke, congestive heart failure, dementia, kidney failure, and premature death. Long-term effects of excessive salt intake include stiffening of the arteries and thickening of heart muscle and organ damage. Recommendations include ways to reduce hypertension and the risk of heart disease.  Diseases of Our Time - Focusing on Diabetes Clinical staff conducted group or individual video education with verbal and written material and guidebook.  Patient learns why the best way to stop diseases of our time is prevention, through food and other lifestyle changes. Medicine (such as prescription pills and surgeries) is often only a Band-Aid on the problem, not a long-term solution. Most common diseases of our time include obesity, type 2 diabetes, hypertension, heart disease, and cancer. The Pritikin Program is recommended and has been proven to help reduce, reverse, and/or prevent the damaging effects of metabolic syndrome.  Nutrition   Overview of the Pritikin Eating Plan  Clinical staff conducted group or individual video education with verbal and written material and guidebook.  Patient learns about the Pritikin Eating Plan for disease risk reduction. The  Pritikin Eating Plan emphasizes a wide variety of unrefined, minimally-processed carbohydrates, like fruits, vegetables, whole grains, and legumes. Go, Caution, and Stop food choices are explained. Plant-based and lean animal proteins are emphasized. Rationale provided for low sodium intake for blood pressure control, low added sugars for blood sugar stabilization, and low added fats and oils for coronary artery disease risk reduction and weight management.  Calorie Density  Clinical staff conducted group or individual video education with verbal and written material and guidebook.  Patient learns about calorie density and how it impacts the Pritikin Eating Plan. Knowing the characteristics of the food you choose will help you decide whether those foods will lead to weight gain or weight loss, and whether you want to consume more or less of them. Weight loss is usually a side effect of the Pritikin Eating Plan because of its focus on low calorie-dense foods.  Label  Reading  Clinical staff conducted group or individual video education with verbal and written material and guidebook.  Patient learns about the Pritikin recommended label reading guidelines and corresponding recommendations regarding calorie density, added sugars, sodium content, and whole grains.  Dining Out - Part 1  Clinical staff conducted group or individual video education with verbal and written material and guidebook.  Patient learns that restaurant meals can be sabotaging because they can be so high in calories, fat, sodium, and/or sugar. Patient learns recommended strategies on how to positively address this and avoid unhealthy pitfalls.  Facts on Fats  Clinical staff conducted group or individual video education with verbal and written material and guidebook.  Patient learns that lifestyle modifications can be just as effective, if not more so, as many medications for lowering your risk of heart disease. A Pritikin lifestyle can  help to reduce your risk of inflammation and atherosclerosis (cholesterol build-up, or plaque, in the artery walls). Lifestyle interventions such as dietary choices and physical activity address the cause of atherosclerosis. A review of the types of fats and their impact on blood cholesterol levels, along with dietary recommendations to reduce fat intake is also included.  Nutrition Action Plan  Clinical staff conducted group or individual video education with verbal and written material and guidebook.  Patient learns how to incorporate Pritikin recommendations into their lifestyle. Recommendations include planning and keeping personal health goals in mind as an important part of their success.  Healthy Mind-Set    Healthy Minds, Bodies, Hearts  Clinical staff conducted group or individual video education with verbal and written material and guidebook.  Patient learns how to identify when they are stressed. Video will discuss the impact of that stress, as well as the many benefits of stress management. Patient will also be introduced to stress management techniques. The way we think, act, and feel has an impact on our hearts.  How Our Thoughts Can Heal Our Hearts  Clinical staff conducted group or individual video education with verbal and written material and guidebook.  Patient learns that negative thoughts can cause depression and anxiety. This can result in negative lifestyle behavior and serious health problems. Cognitive behavioral therapy is an effective method to help control our thoughts in order to change and improve our emotional outlook.  Additional Videos:  Exercise    Improving Performance  Clinical staff conducted group or individual video education with verbal and written material and guidebook.  Patient learns to use a non-linear approach by alternating intensity levels and lengths of time spent exercising to help burn more calories and lose more body fat. Cardiovascular exercise  helps improve heart health, metabolism, hormonal balance, blood sugar control, and recovery from fatigue. Resistance training improves strength, endurance, balance, coordination, reaction time, metabolism, and muscle mass. Flexibility exercise improves circulation, posture, and balance. Seek guidance from your physician and exercise physiologist before implementing an exercise routine and learn your capabilities and proper form for all exercise.  Introduction to Yoga  Clinical staff conducted group or individual video education with verbal and written material and guidebook.  Patient learns about yoga, a discipline of the coming together of mind, breath, and body. The benefits of yoga include improved flexibility, improved range of motion, better posture and core strength, increased lung function, weight loss, and positive self-image. Yoga's heart health benefits include lowered blood pressure, healthier heart rate, decreased cholesterol and triglyceride levels, improved immune function, and reduced stress. Seek guidance from your physician and exercise physiologist before implementing an  exercise routine and learn your capabilities and proper form for all exercise.  Medical   Aging: Enhancing Your Quality of Life  Clinical staff conducted group or individual video education with verbal and written material and guidebook.  Patient learns key strategies and recommendations to stay in good physical health and enhance quality of life, such as prevention strategies, having an advocate, securing a Health Care Proxy and Power of Attorney, and keeping a list of medications and system for tracking them. It also discusses how to avoid risk for bone loss.  Biology of Weight Control  Clinical staff conducted group or individual video education with verbal and written material and guidebook.  Patient learns that weight gain occurs because we consume more calories than we burn (eating more, moving less). Even if  your body weight is normal, you may have higher ratios of fat compared to muscle mass. Too much body fat puts you at increased risk for cardiovascular disease, heart attack, stroke, type 2 diabetes, and obesity-related cancers. In addition to exercise, following the Pritikin Eating Plan can help reduce your risk.  Decoding Lab Results  Clinical staff conducted group or individual video education with verbal and written material and guidebook.  Patient learns that lab test reflects one measurement whose values change over time and are influenced by many factors, including medication, stress, sleep, exercise, food, hydration, pre-existing medical conditions, and more. It is recommended to use the knowledge from this video to become more involved with your lab results and evaluate your numbers to speak with your doctor.   Diseases of Our Time - Overview  Clinical staff conducted group or individual video education with verbal and written material and guidebook.  Patient learns that according to the CDC, 50% to 70% of chronic diseases (such as obesity, type 2 diabetes, elevated lipids, hypertension, and heart disease) are avoidable through lifestyle improvements including healthier food choices, listening to satiety cues, and increased physical activity.  Sleep Disorders Clinical staff conducted group or individual video education with verbal and written material and guidebook.  Patient learns how good quality and duration of sleep are important to overall health and well-being. Patient also learns about sleep disorders and how they impact health along with recommendations to address them, including discussing with a physician.  Nutrition  Dining Out - Part 2 Clinical staff conducted group or individual video education with verbal and written material and guidebook.  Patient learns how to plan ahead and communicate in order to maximize their dining experience in a healthy and nutritious manner.  Included are recommended food choices based on the type of restaurant the patient is visiting.   Fueling a Banker conducted group or individual video education with verbal and written material and guidebook.  There is a strong connection between our food choices and our health. Diseases like obesity and type 2 diabetes are very prevalent and are in large-part due to lifestyle choices. The Pritikin Eating Plan provides plenty of food and hunger-curbing satisfaction. It is easy to follow, affordable, and helps reduce health risks.  Menu Workshop  Clinical staff conducted group or individual video education with verbal and written material and guidebook.  Patient learns that restaurant meals can sabotage health goals because they are often packed with calories, fat, sodium, and sugar. Recommendations include strategies to plan ahead and to communicate with the manager, chef, or server to help order a healthier meal.  Planning Your Eating Strategy  Clinical staff conducted group or individual video  education with verbal and written material and guidebook.  Patient learns about the Pritikin Eating Plan and its benefit of reducing the risk of disease. The Pritikin Eating Plan does not focus on calories. Instead, it emphasizes high-quality, nutrient-rich foods. By knowing the characteristics of the foods, we choose, we can determine their calorie density and make informed decisions.  Targeting Your Nutrition Priorities  Clinical staff conducted group or individual video education with verbal and written material and guidebook.  Patient learns that lifestyle habits have a tremendous impact on disease risk and progression. This video provides eating and physical activity recommendations based on your personal health goals, such as reducing LDL cholesterol, losing weight, preventing or controlling type 2 diabetes, and reducing high blood pressure.  Vitamins and Minerals  Clinical staff  conducted group or individual video education with verbal and written material and guidebook.  Patient learns different ways to obtain key vitamins and minerals, including through a recommended healthy diet. It is important to discuss all supplements you take with your doctor.   Healthy Mind-Set    Smoking Cessation  Clinical staff conducted group or individual video education with verbal and written material and guidebook.  Patient learns that cigarette smoking and tobacco addiction pose a serious health risk which affects millions of people. Stopping smoking will significantly reduce the risk of heart disease, lung disease, and many forms of cancer. Recommended strategies for quitting are covered, including working with your doctor to develop a successful plan.  Culinary   Becoming a Set Designer conducted group or individual video education with verbal and written material and guidebook.  Patient learns that cooking at home can be healthy, cost-effective, quick, and puts them in control. Keys to cooking healthy recipes will include looking at your recipe, assessing your equipment needs, planning ahead, making it simple, choosing cost-effective seasonal ingredients, and limiting the use of added fats, salts, and sugars.  Cooking - Breakfast and Snacks  Clinical staff conducted group or individual video education with verbal and written material and guidebook.  Patient learns how important breakfast is to satiety and nutrition through the entire day. Recommendations include key foods to eat during breakfast to help stabilize blood sugar levels and to prevent overeating at meals later in the day. Planning ahead is also a key component.  Cooking - Educational Psychologist conducted group or individual video education with verbal and written material and guidebook.  Patient learns eating strategies to improve overall health, including an approach to cook more at home.  Recommendations include thinking of animal protein as a side on your plate rather than center stage and focusing instead on lower calorie dense options like vegetables, fruits, whole grains, and plant-based proteins, such as beans. Making sauces in large quantities to freeze for later and leaving the skin on your vegetables are also recommended to maximize your experience.  Cooking - Healthy Salads and Dressing Clinical staff conducted group or individual video education with verbal and written material and guidebook.  Patient learns that vegetables, fruits, whole grains, and legumes are the foundations of the Pritikin Eating Plan. Recommendations include how to incorporate each of these in flavorful and healthy salads, and how to create homemade salad dressings. Proper handling of ingredients is also covered. Cooking - Soups and [employer] - Soups and Desserts Clinical staff conducted group or individual video education with verbal and written material and guidebook.  Patient learns that Pritikin soups and desserts make for easy, nutritious,  and delicious snacks and meal components that are low in sodium, fat, sugar, and calorie density, while high in vitamins, minerals, and filling fiber. Recommendations include simple and healthy ideas for soups and desserts.   Overview     The Pritikin Solution Program Overview Clinical staff conducted group or individual video education with verbal and written material and guidebook.  Patient learns that the results of the Pritikin Program have been documented in more than 100 articles published in peer-reviewed journals, and the benefits include reducing risk factors for (and, in some cases, even reversing) high cholesterol, high blood pressure, type 2 diabetes, obesity, and more! An overview of the three key pillars of the Pritikin Program will be covered: eating well, doing regular exercise, and having a healthy mind-set.  WORKSHOPS   Exercise: Exercise Basics: Building Your Action Plan Clinical staff led group instruction and group discussion with PowerPoint presentation and patient guidebook. To enhance the learning environment the use of posters, models and videos may be added. At the conclusion of this workshop, patients will comprehend the difference between physical activity and exercise, as well as the benefits of incorporating both, into their routine. Patients will understand the FITT (Frequency, Intensity, Time, and Type) principle and how to use it to build an exercise action plan. In addition, safety concerns and other considerations for exercise and cardiac rehab will be addressed by the presenter. The purpose of this lesson is to promote a comprehensive and effective weekly exercise routine in order to improve patients' overall level of fitness.   Managing Heart Disease: Your Path to a Healthier Heart Clinical staff led group instruction and group discussion with PowerPoint presentation and patient guidebook. To enhance the learning environment the use of posters, models and videos may be added.At the conclusion of this workshop, patients will understand the anatomy and physiology of the heart. Additionally, they will understand how Pritikin's three pillars impact the risk factors, the progression, and the management of heart disease.  The purpose of this lesson is to provide a high-level overview of the heart, heart disease, and how the Pritikin lifestyle positively impacts risk factors.  Exercise Biomechanics Clinical staff led group instruction and group discussion with PowerPoint presentation and patient guidebook. To enhance the learning environment the use of posters, models and videos may be added. Patients will learn how the structural parts of their bodies function and how these functions impact their daily activities, movement, and exercise. Patients will learn how to promote a neutral spine, learn how  to manage pain, and identify ways to improve their physical movement in order to promote healthy living. The purpose of this lesson is to expose patients to common physical limitations that impact physical activity. Participants will learn practical ways to adapt and manage aches and pains, and to minimize their effect on regular exercise. Patients will learn how to maintain good posture while sitting, walking, and lifting.  Balance Training and Fall Prevention  Clinical staff led group instruction and group discussion with PowerPoint presentation and patient guidebook. To enhance the learning environment the use of posters, models and videos may be added. At the conclusion of this workshop, patients will understand the importance of their sensorimotor skills (vision, proprioception, and the vestibular system) in maintaining their ability to balance as they age. Patients will apply a variety of balancing exercises that are appropriate for their current level of function. Patients will understand the common causes for poor balance, possible solutions to these problems, and ways to modify their physical  environment in order to minimize their fall risk. The purpose of this lesson is to teach patients about the importance of maintaining balance as they age and ways to minimize their risk of falling.  WORKSHOPS   Nutrition:  Fueling a Ship Broker led group instruction and group discussion with PowerPoint presentation and patient guidebook. To enhance the learning environment the use of posters, models and videos may be added. Patients will review the foundational principles of the Pritikin Eating Plan and understand what constitutes a serving size in each of the food groups. Patients will also learn Pritikin-friendly foods that are better choices when away from home and review make-ahead meal and snack options. Calorie density will be reviewed and applied to three nutrition priorities:  weight maintenance, weight loss, and weight gain. The purpose of this lesson is to reinforce (in a group setting) the key concepts around what patients are recommended to eat and how to apply these guidelines when away from home by planning and selecting Pritikin-friendly options. Patients will understand how calorie density may be adjusted for different weight management goals.  Mindful Eating  Clinical staff led group instruction and group discussion with PowerPoint presentation and patient guidebook. To enhance the learning environment the use of posters, models and videos may be added. Patients will briefly review the concepts of the Pritikin Eating Plan and the importance of low-calorie dense foods. The concept of mindful eating will be introduced as well as the importance of paying attention to internal hunger signals. Triggers for non-hunger eating and techniques for dealing with triggers will be explored. The purpose of this lesson is to provide patients with the opportunity to review the basic principles of the Pritikin Eating Plan, discuss the value of eating mindfully and how to measure internal cues of hunger and fullness using the Hunger Scale. Patients will also discuss reasons for non-hunger eating and learn strategies to use for controlling emotional eating.  Targeting Your Nutrition Priorities Clinical staff led group instruction and group discussion with PowerPoint presentation and patient guidebook. To enhance the learning environment the use of posters, models and videos may be added. Patients will learn how to determine their genetic susceptibility to disease by reviewing their family history. Patients will gain insight into the importance of diet as part of an overall healthy lifestyle in mitigating the impact of genetics and other environmental insults. The purpose of this lesson is to provide patients with the opportunity to assess their personal nutrition priorities by looking at  their family history, their own health history and current risk factors. Patients will also be able to discuss ways of prioritizing and modifying the Pritikin Eating Plan for their highest risk areas  Menu  Clinical staff led group instruction and group discussion with PowerPoint presentation and patient guidebook. To enhance the learning environment the use of posters, models and videos may be added. Using menus brought in from [employer], or printed from [employer], patients will apply the Pritikin dining out guidelines that were presented in the [employer] video. Patients will also be able to practice these guidelines in a variety of provided scenarios. The purpose of this lesson is to provide patients with the opportunity to practice hands-on learning of the Pritikin Dining Out guidelines with actual menus and practice scenarios.  Label Reading Clinical staff led group instruction and group discussion with PowerPoint presentation and patient guidebook. To enhance the learning environment the use of posters, models and videos may be added. Patients  will review and discuss the Pritikin label reading guidelines presented in Pritikin's Label Reading Educational series video. Using fool labels brought in from local grocery stores and markets, patients will apply the label reading guidelines and determine if the packaged food meet the Pritikin guidelines. The purpose of this lesson is to provide patients with the opportunity to review, discuss, and practice hands-on learning of the Pritikin Label Reading guidelines with actual packaged food labels. Cooking School  Pritikin's [employer] are designed to teach patients ways to prepare quick, simple, and affordable recipes at home. The importance of nutrition's role in chronic disease risk reduction is reflected in its emphasis in the overall Pritikin program. By learning how to prepare essential core Pritikin Eating  Plan recipes, patients will increase control over what they eat; be able to customize the flavor of foods without the use of added salt, sugar, or fat; and improve the quality of the food they consume. By learning a set of core recipes which are easily assembled, quickly prepared, and affordable, patients are more likely to prepare more healthy foods at home. These workshops focus on convenient breakfasts, simple entres, side dishes, and desserts which can be prepared with minimal effort and are consistent with nutrition recommendations for cardiovascular risk reduction. Cooking [employer] are taught by a armed forces logistics/support/administrative officer (RD) who has been trained by the [employer]. The chef or RD has a clear understanding of the importance of minimizing - if not completely eliminating - added fat, sugar, and sodium in recipes. Throughout the series of Cooking School Workshop sessions, patients will learn about healthy ingredients and efficient methods of cooking to build confidence in their capability to prepare    Cooking School weekly topics:  Adding Flavor- Sodium-Free  Fast and Healthy Breakfasts  Powerhouse Plant-Based Proteins  Satisfying Salads and Dressings  Simple Sides and Sauces  International Cuisine-Spotlight on the [employer] Zones  Delicious Desserts  Savory Soups  [employer] - Meals in a Astronomer Appetizers and Snacks  Comforting Weekend Breakfasts  One-Pot Wonders   Fast Evening Meals  Landscape Architect Your Pritikin Plate  WORKSHOPS   Healthy Mindset (Psychosocial):  Focused Goals, Sustainable Changes Clinical staff led group instruction and group discussion with PowerPoint presentation and patient guidebook. To enhance the learning environment the use of posters, models and videos may be added. Patients will be able to apply effective goal setting strategies to establish at least one personal goal, and then take consistent, meaningful  action toward that goal. They will learn to identify common barriers to achieving personal goals and develop strategies to overcome them. Patients will also gain an understanding of how our mind-set can impact our ability to achieve goals and the importance of cultivating a positive and growth-oriented mind-set. The purpose of this lesson is to provide patients with a deeper understanding of how to set and achieve personal goals, as well as the tools and strategies needed to overcome common obstacles which may arise along the way.  From Head to Heart: The Power of a Healthy Outlook  Clinical staff led group instruction and group discussion with PowerPoint presentation and patient guidebook. To enhance the learning environment the use of posters, models and videos may be added. Patients will be able to recognize and describe the impact of emotions and mood on physical health. They will discover the importance of self-care and explore self-care practices which may work for them. Patients will also learn how to  utilize the 4 C's to cultivate a healthier outlook and better manage stress and challenges. The purpose of this lesson is to demonstrate to patients how a healthy outlook is an essential part of maintaining good health, especially as they continue their cardiac rehab journey.  Healthy Sleep for a Healthy Heart Clinical staff led group instruction and group discussion with PowerPoint presentation and patient guidebook. To enhance the learning environment the use of posters, models and videos may be added. At the conclusion of this workshop, patients will be able to demonstrate knowledge of the importance of sleep to overall health, well-being, and quality of life. They will understand the symptoms of, and treatments for, common sleep disorders. Patients will also be able to identify daytime and nighttime behaviors which impact sleep, and they will be able to apply these tools to help manage sleep-related  challenges. The purpose of this lesson is to provide patients with a general overview of sleep and outline the importance of quality sleep. Patients will learn about a few of the most common sleep disorders. Patients will also be introduced to the concept of "sleep hygiene," and discover ways to self-manage certain sleeping problems through simple daily behavior changes. Finally, the workshop will motivate patients by clarifying the links between quality sleep and their goals of heart-healthy living.   Recognizing and Reducing Stress Clinical staff led group instruction and group discussion with PowerPoint presentation and patient guidebook. To enhance the learning environment the use of posters, models and videos may be added. At the conclusion of this workshop, patients will be able to understand the types of stress reactions, differentiate between acute and chronic stress, and recognize the impact that chronic stress has on their health. They will also be able to apply different coping mechanisms, such as reframing negative self-talk. Patients will have the opportunity to practice a variety of stress management techniques, such as deep abdominal breathing, progressive muscle relaxation, and/or guided imagery.  The purpose of this lesson is to educate patients on the role of stress in their lives and to provide healthy techniques for coping with it.  Learning Barriers/Preferences:  Learning Barriers/Preferences - 07/14/24 1419       Learning Barriers/Preferences   Learning Barriers None    Learning Preferences Audio;Computer/Internet;Group Instruction;Individual Instruction;Pictoral;Skilled Demonstration;Verbal Instruction;Video;Written Material          Education Topics:  Knowledge Questionnaire Score:  Knowledge Questionnaire Score - 07/14/24 1559       Knowledge Questionnaire Score   Pre Score 24/24          Core Components/Risk Factors/Patient Goals at Admission:  Personal Goals  and Risk Factors at Admission - 07/14/24 1433       Core Components/Risk Factors/Patient Goals on Admission    Weight Management Yes;Obesity;Weight Loss    Intervention Weight Management/Obesity: Establish reasonable short term and long term weight goals.;Obesity: Provide education and appropriate resources to help participant work on and attain dietary goals.    Admit Weight 304 lb 0.2 oz (137.9 kg)    Expected Outcomes Short Term: Continue to assess and modify interventions until short term weight is achieved;Long Term: Adherence to nutrition and physical activity/exercise program aimed toward attainment of established weight goal;Weight Loss: Understanding of general recommendations for a balanced deficit meal plan, which promotes 1-2 lb weight loss per week and includes a negative energy balance of [phone] kcal/d    Heart Failure Yes    Intervention Provide a combined exercise and nutrition program that is supplemented with education, support  and counseling about heart failure. Directed toward relieving symptoms such as shortness of breath, decreased exercise tolerance, and extremity edema.    Expected Outcomes Improve functional capacity of life;Short term: Attendance in program 2-3 days a week with increased exercise capacity. Reported lower sodium intake. Reported increased fruit and vegetable intake. Reports medication compliance.;Short term: Daily weights obtained and reported for increase. Utilizing diuretic protocols set by physician.;Long term: Adoption of self-care skills and reduction of barriers for early signs and symptoms recognition and intervention leading to self-care maintenance.    Lipids Yes    Intervention Provide education and support for participant on nutrition & aerobic/resistive exercise along with prescribed medications to achieve LDL 70mg , HDL >40mg .    Expected Outcomes Short Term: Participant states understanding of desired cholesterol values and is compliant with  medications prescribed. Participant is following exercise prescription and nutrition guidelines.;Long Term: Cholesterol controlled with medications as prescribed, with individualized exercise RX and with personalized nutrition plan. Value goals: LDL < 70mg , HDL > 40 mg.          Core Components/Risk Factors/Patient Goals Review:   Goals and Risk Factor Review     Row Name 07/29/24 1103 08/25/24 1125 09/22/24 1733         Core Components/Risk Factors/Patient Goals Review   Personal Goals Review Weight Management/Obesity;Heart Failure;Lipids Weight Management/Obesity;Heart Failure;Lipids Weight Management/Obesity;Heart Failure;Lipids     Review Stacey Huerta started cardiac rehab on 07/20/24 and is off to a good start to exercise. Vital signs have been stable. Stacey Huerta is doing well with exercise at cardiac rehab. Vital signs have been stable. Stacey Huerta has gained 1.1 kg since starting the program Stacey Huerta continues to well with exercise at cardiac rehab. Vital signs remain stable. Stacey Huerta has increased her workloads. No further ectopy has been noted. Will continue to monitor.     Expected Outcomes Stacey Huerta will continue to participate in cardiac rehab for exercise, nutrition and lifestyle modifications. Stacey Huerta will continue to participate in cardiac rehab for exercise, nutrition and lifestyle modifications. Stacey Huerta will continue to participate in cardiac rehab for exercise, nutrition and lifestyle modifications.        Core Components/Risk Factors/Patient Goals at Discharge (Final Review):   Goals and Risk Factor Review - 09/22/24 1733       Core Components/Risk Factors/Patient Goals Review   Personal Goals Review Weight Management/Obesity;Heart Failure;Lipids    Review Stacey Huerta continues to well with exercise at cardiac rehab. Vital signs remain stable. Stacey Huerta has increased her workloads. No further ectopy has been noted. Will continue to monitor.    Expected Outcomes Stacey Huerta will continue to  participate in cardiac rehab for exercise, nutrition and lifestyle modifications.          ITP Comments:  ITP Comments     Row Name 07/14/24 1325 07/29/24 1058 08/25/24 1119 09/22/24 1731     ITP Comments Wilbert Bihari, MD: Medical Director. Introduction to the [employer] / Intensive Cardiac Rehab. Intial orientation packet reviewed with the patient. 30 Day ITP review.  Etherine started cardiac rehab on 07/20/24.  Stacee did well with exercise 30 Day ITP review.  Saryna has good attendance and participation with exercise at  cardiac rehab 30 Day ITP review.  Maresha continues to have good attendance and participation with exercise at  cardiac rehab       Comments: See ITP comments.Hadassah Elpidio Quan RN BSN

## 2024-09-23 ENCOUNTER — Encounter (HOSPITAL_COMMUNITY)
Admission: RE | Admit: 2024-09-23 | Discharge: 2024-09-23 | Disposition: A | Source: Ambulatory Visit | Attending: Cardiology

## 2024-09-23 DIAGNOSIS — I5022 Chronic systolic (congestive) heart failure: Secondary | ICD-10-CM | POA: Diagnosis not present

## 2024-09-25 ENCOUNTER — Telehealth (HOSPITAL_COMMUNITY): Payer: Self-pay

## 2024-09-25 ENCOUNTER — Encounter (HOSPITAL_COMMUNITY): Admission: RE | Admit: 2024-09-25

## 2024-09-25 NOTE — Telephone Encounter (Signed)
 Patient c/o for 1:45 CR class, no reason given.

## 2024-09-28 ENCOUNTER — Encounter (HOSPITAL_COMMUNITY)

## 2024-09-28 ENCOUNTER — Telehealth (HOSPITAL_COMMUNITY): Payer: Self-pay

## 2024-09-28 NOTE — Telephone Encounter (Signed)
 Patient c/o for 1:45 CR class.

## 2024-09-30 ENCOUNTER — Encounter (HOSPITAL_COMMUNITY)

## 2024-10-02 ENCOUNTER — Encounter (HOSPITAL_COMMUNITY)
Admission: RE | Admit: 2024-10-02 | Discharge: 2024-10-02 | Disposition: A | Source: Ambulatory Visit | Attending: Cardiology

## 2024-10-02 DIAGNOSIS — I5022 Chronic systolic (congestive) heart failure: Secondary | ICD-10-CM

## 2024-10-05 ENCOUNTER — Encounter (HOSPITAL_COMMUNITY)
Admission: RE | Admit: 2024-10-05 | Discharge: 2024-10-05 | Disposition: A | Source: Ambulatory Visit | Attending: Cardiology

## 2024-10-05 DIAGNOSIS — I5022 Chronic systolic (congestive) heart failure: Secondary | ICD-10-CM | POA: Diagnosis not present

## 2024-10-07 ENCOUNTER — Encounter (HOSPITAL_COMMUNITY): Admission: RE | Admit: 2024-10-07 | Discharge: 2024-10-07 | Attending: Cardiology

## 2024-10-07 DIAGNOSIS — I5022 Chronic systolic (congestive) heart failure: Secondary | ICD-10-CM

## 2024-10-07 NOTE — Addendum Note (Signed)
 Encounter addended by: Debarah Garrison MATSU, RN on: 10/07/2024 3:49 PM  Actions taken: Imaging Exam ended

## 2024-10-09 ENCOUNTER — Encounter (HOSPITAL_COMMUNITY): Admission: RE | Admit: 2024-10-09 | Discharge: 2024-10-09 | Attending: Cardiology

## 2024-10-09 DIAGNOSIS — I5022 Chronic systolic (congestive) heart failure: Secondary | ICD-10-CM

## 2024-10-09 NOTE — Progress Notes (Signed)
 Heart rate in the low 100's. Sinus tach. Stacey Huerta says that she is dealing with a lot of stress at work and is thinking about applying for short term disability . Blood pressure 108/68. Stacey Huerta says that she is not going to stay for exercise and go home to rest. Stacey Huerta plans to return to exercise on Monday.Hadassah Elpidio Quan RN BSN

## 2024-10-12 ENCOUNTER — Encounter (HOSPITAL_COMMUNITY): Admission: RE | Admit: 2024-10-12 | Discharge: 2024-10-12 | Attending: Cardiology

## 2024-10-12 DIAGNOSIS — I471 Supraventricular tachycardia, unspecified: Secondary | ICD-10-CM

## 2024-10-12 DIAGNOSIS — I5022 Chronic systolic (congestive) heart failure: Secondary | ICD-10-CM

## 2024-10-14 ENCOUNTER — Encounter (HOSPITAL_COMMUNITY)

## 2024-10-16 ENCOUNTER — Encounter (HOSPITAL_COMMUNITY)

## 2024-10-19 ENCOUNTER — Encounter (HOSPITAL_COMMUNITY)
Admission: RE | Admit: 2024-10-19 | Discharge: 2024-10-19 | Disposition: A | Source: Ambulatory Visit | Attending: Cardiology

## 2024-10-19 DIAGNOSIS — I5022 Chronic systolic (congestive) heart failure: Secondary | ICD-10-CM | POA: Diagnosis not present

## 2024-10-20 NOTE — Progress Notes (Signed)
 Cardiac Individual Treatment Plan  Patient Details  Name: Stacey Huerta MRN: [id number] Date of Birth: [birth year] Referring Provider:   Flowsheet Row INTENSIVE CARDIAC REHAB ORIENT from 07/14/2024 in [facility] for Heart, Vascular, & Lung Health  Referring Provider Zenaida Morene PARAS, MD    Initial Encounter Date:  Flowsheet Row INTENSIVE CARDIAC REHAB ORIENT from 07/14/2024 in [facility] for Heart, Vascular, & Lung Health  Date 07/14/24    Visit Diagnosis: Heart failure, chronic systolic (HCC)  Patient's Home Medications on Admission: Current Medications[1]  Past Medical History: Past Medical History:  Diagnosis Date   Adjustment disorder with anxiety 12/05/2022   Allergy     Back pain    Bilateral primary osteoarthritis of knee 02/12/2023   Congestive heart failure (HCC) 09/2023   Depressive episode 11/01/2022   Dysphoric mood 12/14/2014   Dyspnea    Fibroid    Hyperglycemia 12/19/2015   Infertility, female    Influenza 12/23/2018   Insulin  resistance 10/08/2018   Lack of concentration 11/01/2022   Leg edema    Major depressive disorder, recurrent episode, moderate (HCC) 11/21/2022   Morbid obesity (HCC) 03/16/2014   OBESITY, UNSPECIFIED 04/06/2008   Qualifier: Diagnosis of   By: Antonio ROSALEA Rockers         Osteoarthritis    Other fatigue 12/08/2019   Other hyperlipidemia 10/08/2018   Pansinusitis 12/23/2018   Postpartum depression    Preeclampsia 03/16/2014   Preventative health care 11/01/2022   Seasonal affective disorder 12/05/2022   Sjogren's syndrome 10/28/2015   Thrombocytopenia, unspecified 03/16/2014   Urticaria due to food allergy  09/28/2012   Allergy  Profile 07/23/12- specific elevations for dust mite, dog dander, cockroach and some weed pollens.  Food IgG profile- elevated to all agents tested, as has been my experience with this test.   Allergy  Profile 09/15/12- Total IgE 536.8, specific elevations for  orange, chicken, apple, shrimp, tuna  ANA 09/15/12- Pos speckled 1:160  ESR 23        Vitamin B12 deficiency 11/01/2022   Vitamin D  deficiency    Xerophthalmia 04/15/2023   Xerostomia 02/12/2023    Tobacco Use: Tobacco Use History[2]  Labs: Review Flowsheet  More data exists      Latest Ref Rng & Units 05/12/2020 11/01/2022 12/23/2023 03/17/2024 06/12/2024  Labs for ITP Cardiac and Pulmonary Rehab  Cholestrol 0 - 200 mg/dL 847  813  861  877  -  LDL (calc) 0 - 99 mg/dL 92  883  84  65  -  HDL-C >39.00 mg/dL 43  60.99  37  62.89  -  Trlycerides 0.0 - 149.0 mg/dL 90  844.9  89  03.9  -  Hemoglobin A1c 4.6 - 6.5 % 5.5  5.2  - 5.8  -  PH, Arterial 7.35 - 7.45 7.35 - 7.45 - - - - 7.391  7.414   PCO2 arterial 32 - 48 mmHg 32 - 48 mmHg - - - - 44.9  44.8   Bicarbonate 20.0 - 28.0 mmol/L 20.0 - 28.0 mmol/L - - - - 27.2  28.6   TCO2 22 - 32 mmol/L 22 - 32 mmol/L - - - - 29  30   O2 Saturation % % - - - - 65  69     Details       Multiple values from one day are sorted in reverse-chronological order         Capillary Blood Glucose: No results found for: GLUCAP  Exercise Target Goals: Exercise Program Goal: Individual exercise prescription set using results from initial 6 min walk test and THRR while considering  patients activity barriers and safety.   Exercise Prescription Goal: Initial exercise prescription builds to 30-45 minutes a day of aerobic activity, 2-3 days per week.  Home exercise guidelines will be given to patient during program as part of exercise prescription that the participant will acknowledge.  Activity Barriers & Risk Stratification:  Activity Barriers & Cardiac Risk Stratification - 07/14/24 1334       Activity Barriers & Cardiac Risk Stratification   Activity Barriers Decreased Ventricular Function;Shortness of Breath;Other (comment)    Comments Right foot surgery in 2022, currently in PT.    Cardiac Risk Stratification High          6  Minute Walk:  6 Minute Walk     Row Name 07/14/24 1454         6 Minute Walk   Phase Initial     Distance 1068 feet     Walk Time 6 minutes     # of Rest Breaks 2     MPH 2.02     METS 2.71     RPE 15     Perceived Dyspnea  2.5     VO2 Peak 9.49     Symptoms Yes (comment)     Comments Shortness of breath, mild with difficulty.     Resting HR 84 bpm     Resting BP 94/60     Resting Oxygen Saturation  99 %     Exercise Oxygen Saturation  during 6 min walk 100 %     Max Ex. HR 133 bpm     Max Ex. BP 108/70     2 Minute Post BP 100/68        Oxygen Initial Assessment:   Oxygen Re-Evaluation:   Oxygen Discharge (Final Oxygen Re-Evaluation):   Initial Exercise Prescription:  Initial Exercise Prescription - 07/14/24 1500       Date of Initial Exercise RX and Referring Provider   Date 07/14/24    Referring Provider Zenaida Morene PARAS, MD    Expected Discharge Date 10/07/24      Recumbant Bike   Level 1    RPM 25    Watts 15    Minutes 15    METs 1.6      NuStep   Level 1    SPM 85    Minutes 15    METs 2      Prescription Details   Frequency (times per week) 3    Duration Progress to 30 minutes of continuous aerobic without signs/symptoms of physical distress      Intensity   THRR 40-80% of Max Heartrate 68-136    Ratings of Perceived Exertion 11-13    Perceived Dyspnea 0-4      Progression   Progression Continue to progress workloads to maintain intensity without signs/symptoms of physical distress.      Resistance Training   Training Prescription Yes    Weight 5 lbs    Reps 10-15          Perform Capillary Blood Glucose checks as needed.  Exercise Prescription Changes:   Exercise Prescription Changes     Row Name 07/20/24 1627 08/21/24 1701 09/09/24 1705 10/05/24 1630       Response to Exercise   Blood Pressure (Admit) 92/62 94/70 100/66 90/62    Blood Pressure (Exercise) 138/82 -- -- --  Blood Pressure (Exit) 96/64 96/64 96/60   96/68    Heart Rate (Admit) 92 bpm 98 bpm 99 bpm 90 bpm    Heart Rate (Exercise) 141 bpm 146 bpm 142 bpm 150 bpm    Heart Rate (Exit) 101 bpm 106 bpm 92 bpm 103 bpm    Rating of Perceived Exertion (Exercise) 11 11 11 10     Perceived Dyspnea (Exercise) 0 0 0 0    Symptoms None None None None    Comments Pt first day in the [facility] program Reviewed MET's and goals Reviewed MET's Reviewed MET's and goals    Duration Progress to 30 minutes of  aerobic without signs/symptoms of physical distress Progress to 30 minutes of  aerobic without signs/symptoms of physical distress Progress to 30 minutes of  aerobic without signs/symptoms of physical distress Progress to 30 minutes of  aerobic without signs/symptoms of physical distress    Intensity THRR unchanged THRR unchanged THRR unchanged THRR unchanged      Progression   Progression Continue to progress workloads to maintain intensity without signs/symptoms of physical distress. Continue to progress workloads to maintain intensity without signs/symptoms of physical distress. Continue to progress workloads to maintain intensity without signs/symptoms of physical distress. Continue to progress workloads to maintain intensity without signs/symptoms of physical distress.    Average METs 2.6 3.25 3.23 3.65      Resistance Training   Training Prescription Yes Yes No --    Weight 5 lbs 6 lbs 6 lbs 6 lbs    Reps 10-15 10-15 10-15 10-15    Time 10 Minutes 10 Minutes 10 Minutes 10 Minutes      Recumbant Bike   Level 1 3 4 4     RPM 79 82 67 71    Watts 13 51 59 64    Minutes 15 15 15 15     METs 2.1 2.9 3.3 3.9      NuStep   Level 1 2 -- --    SPM 112 110 -- --    Minutes 15 15 -- --    METs 3.1 3.6 -- --      Recumbant Elliptical   Level -- -- -- 3    Minutes -- -- -- 15    METs -- -- -- 3.9       Exercise Comments:   Exercise Comments     Row Name 07/20/24 1632 08/21/24 1706 09/09/24 1721 10/05/24 1630     Exercise Comments Pt first  day in the [facility] program. Pt tolerated exercise well with an average MET level of 2.6. She is off to a good start and is learning her THRR, RPE and ExRx Reviewed MET's and goals. Pt tolerated exercise well with an average MET level of 3.25. She is doign well and progressing WLs and MET's. She is starting to reach her THRR. Will reahc out about increases in [facility] for Dr. office. She is already getting better control of her SOB and is gaining mroe cardio endurance. Overall she's doing well and feels well. Will go over home ExRx soon, she has a memebership to [employer] and likes walking Reviewed MET's and goals. Pt tolerated exercise well with an average MET level of 3.25. She has recieved her new THRR and is doing well with exercise. She did have a run of SVT last week. So she has a Zio patch for the next two weeks and an increase in her Metoprolol . Will wait to see results from Zio patch before going  over home ExEx, but she does have a membership to [employer] and does enjoy walking Reviewed MET's and goals. Pt tolerated exercise well with an average MET level of 3.65. She has been doing good with exercise. Transitioning to Upright Eliiptical. She is feeling good and is still waiting for her Zio patch results but is feeling an increase in energy.       Exercise Goals and Review:   Exercise Goals     Row Name 07/14/24 1325             Exercise Goals   Increase Physical Activity Yes       Intervention Provide advice, education, support and counseling about physical activity/exercise needs.;Develop an individualized exercise prescription for aerobic and resistive training based on initial evaluation findings, risk stratification, comorbidities and participant's personal goals.       Expected Outcomes Short Term: Attend rehab on a regular basis to increase amount of physical activity.;Long Term: Exercising regularly at least 3-5 days a week.;Long Term: Add in home exercise to make exercise part of routine  and to increase amount of physical activity.       Increase Strength and Stamina Yes       Intervention Provide advice, education, support and counseling about physical activity/exercise needs.;Develop an individualized exercise prescription for aerobic and resistive training based on initial evaluation findings, risk stratification, comorbidities and participant's personal goals.       Expected Outcomes Short Term: Increase workloads from initial exercise prescription for resistance, speed, and METs.;Short Term: Perform resistance training exercises routinely during rehab and add in resistance training at home;Long Term: Improve cardiorespiratory fitness, muscular endurance and strength as measured by increased METs and functional capacity ( )       Able to understand and use rate of perceived exertion (RPE) scale Yes       Intervention Provide education and explanation on how to use RPE scale       Expected Outcomes Short Term: Able to use RPE daily in rehab to express subjective intensity level;Long Term:  Able to use RPE to guide intensity level when exercising independently       Knowledge and understanding of Target Heart Rate Range (THRR) Yes       Intervention Provide education and explanation of THRR including how the numbers were predicted and where they are located for reference       Expected Outcomes Short Term: Able to state/look up THRR;Long Term: Able to use THRR to govern intensity when exercising independently;Short Term: Able to use daily as guideline for intensity in rehab       Understanding of Exercise Prescription Yes       Intervention Provide education, explanation, and written materials on patient's individual exercise prescription       Expected Outcomes Short Term: Able to explain program exercise prescription;Long Term: Able to explain home exercise prescription to exercise independently          Exercise Goals Re-Evaluation :  Exercise Goals Re-Evaluation     Row  Name 07/20/24 1630 08/21/24 1704 09/09/24 1717 10/05/24 1630       Exercise Goal Re-Evaluation   Exercise Goals Review Increase Physical Activity;Understanding of Exercise Prescription;Increase Strength and Stamina;Knowledge and understanding of Target Heart Rate Range (THRR);Able to understand and use rate of perceived exertion (RPE) scale Increase Physical Activity;Understanding of Exercise Prescription;Increase Strength and Stamina;Knowledge and understanding of Target Heart Rate Range (THRR);Able to understand and use rate of perceived exertion (RPE) scale Increase Physical  Activity;Understanding of Exercise Prescription;Increase Strength and Stamina;Knowledge and understanding of Target Heart Rate Range (THRR);Able to understand and use rate of perceived exertion (RPE) scale Increase Physical Activity;Understanding of Exercise Prescription;Increase Strength and Stamina;Knowledge and understanding of Target Heart Rate Range (THRR);Able to understand and use rate of perceived exertion (RPE) scale    Comments Pt first day in the [facility] program. Pt tolerated exercise well with an average MET level of 2.6. She is off to a good start and is learning her THRR, RPE and ExRx. Slightly over THRR, will let her know on her net visit. Remained asymptomatic Reviewed MET's and goals. Pt tolerated exercise well with an average MET level of 3.25. She is doign well and progressing WLs and MET's. She is starting to reach her THRR. Will reahc out about increases in [facility] for Dr. office. She is already getting better control of her SOB and is gaining mroe cardio endurance. Overall she's doing well and feels well. Will go over home ExRx soon, she has a memebership to [employer] and likes walking Reviewed MET's and goals. Pt tolerated exercise well with an average MET level of 3.25. She has recieved her new THRR and is doing well with exercise. She did have a run of SVT last week. So she has a Zio patch for the next two weeks and an  increase in her Metoprolol . Will wait to see results from Zio patch before going over home ExEx, but she does have a membership to [employer] and does enjoy walking Reviewed MET's and goals. Pt tolerated exercise well with an average MET level of 3.65. She has been doing good with exercise. Transitioning to Upright Eliiptical. She is feeling good and is still waiting for her Zio patch results but is feeling an increase in energy.    Expected Outcomes Will continue to monitor patient and progress workloads as tolerated without sign or symptom Will continue to monitor patient and progress workloads as tolerated without sign or symptom Will continue to monitor patient and progress workloads as tolerated without sign or symptom Will continue to monitor patient and progress workloads as tolerated without sign or symptom       Discharge Exercise Prescription (Final Exercise Prescription Changes):  Exercise Prescription Changes - 10/05/24 1630       Response to Exercise   Blood Pressure (Admit) 90/62    Blood Pressure (Exit) 96/68    Heart Rate (Admit) 90 bpm    Heart Rate (Exercise) 150 bpm    Heart Rate (Exit) 103 bpm    Rating of Perceived Exertion (Exercise) 10    Perceived Dyspnea (Exercise) 0    Symptoms None    Comments Reviewed MET's and goals    Duration Progress to 30 minutes of  aerobic without signs/symptoms of physical distress    Intensity THRR unchanged      Progression   Progression Continue to progress workloads to maintain intensity without signs/symptoms of physical distress.    Average METs 3.65      Resistance Training   Weight 6 lbs    Reps 10-15    Time 10 Minutes      Recumbant Bike   Level 4    RPM 71    Watts 64    Minutes 15    METs 3.9      Recumbant Elliptical   Level 3    Minutes 15    METs 3.9          Nutrition:  Target Goals:  Understanding of nutrition guidelines, daily intake of sodium 1500mg , cholesterol 200mg , calories 30% from fat and 7%  or less from saturated fats, daily to have 5 or more servings of fruits and vegetables.  Biometrics:  Pre Biometrics - 07/14/24 1254       Pre Biometrics   Waist Circumference 53 inches    Hip Circumference 60 inches    Waist to Hip Ratio 0.88 %    Triceps Skinfold 48 mm    % Body Fat 54.2 %    Grip Strength 33 kg    Flexibility 17.88 in    Single Leg Stand 30 seconds           Nutrition Therapy Plan and Nutrition Goals:  Nutrition Therapy & Goals - 08/31/24 1532       Nutrition Therapy   Diet Heart Healthy      Personal Nutrition Goals   Nutrition Goal Patient to identify strategies for reducing cardiovascular risk by attending the [facility] education and nutrition series weekly.    Personal Goal #2 Patient to improve diet quality by using the plate method as a guide for meal planning to include lean protein/plant protein, fruits, vegetables, whole grains, nonfat dairy as part of a well-balanced diet.    Personal Goal #3 Patient to identify strategies for weight loss with goal of 0.5-2 # per week of weight loss.    Comments Pt with history of chronic systolic heart failure. Pt reports working to maintain consistency in making healthy food choices in order to promote heart health as well as wt loss. Current BMI >40 kg/m2. Patient will benefit from participation in intensive cardiac rehab for nutrition education, exercise, and lifestyle modification.      Intervention Plan   Intervention Prescribe, educate and counsel regarding individualized specific dietary modifications aiming towards targeted core components such as weight, hypertension, lipid management, diabetes, heart failure and other comorbidities.    Expected Outcomes Short Term Goal: Understand basic principles of dietary content, such as calories, fat, sodium, cholesterol and nutrients.;Long Term Goal: Adherence to prescribed nutrition plan.          Nutrition Assessments:  Nutrition Assessments - 07/22/24 1632        Rate Your Plate Scores   Pre Score 64         MEDIFICTS Score Key: >=70 Need to make dietary changes  40-70 Heart Healthy Diet <= 40 Therapeutic Level Cholesterol Diet   Flowsheet Row INTENSIVE CARDIAC REHAB from 07/22/2024 in [facility] for Heart, Vascular, & Lung Health  Picture Your Plate Total Score on Admission 64   Picture Your Plate Scores: <59 Unhealthy dietary pattern with much room for improvement. 41-50 Dietary pattern unlikely to meet recommendations for good health and room for improvement. 51-60 More healthful dietary pattern, with some room for improvement.  >60 Healthy dietary pattern, although there may be some specific behaviors that could be improved.    Nutrition Goals Re-Evaluation:  Nutrition Goals Re-Evaluation     Row Name 09/21/24 1521 10/19/24 1544           Goals   Current Weight 309 lb 1.4 oz (140.2 kg) 306 lb 10.6 oz (139.1 kg)  10/12/2024      Nutrition Goal Patient to identify strategies for reducing cardiovascular risk by attending the [facility] education and nutrition series weekly. Patient to identify strategies for reducing cardiovascular risk by attending the [facility] education and nutrition series weekly.      Comment -- Non-significant wt  loss of <1% over past month.      Expected Outcome Goals in action. Pt with history of chronic systolic heart failure. Non-significant wt gain of 1.4% noted over past 2 months. Pt reports difficulty making healthy food choices due to lack of time available for meal preparation. Describes lunch as largest meal; typically consists of salad, juice, or low sodium soup. Pt verbalizes importance of not overly restricting caloric intake. RD provided suggestions for easy meal prep such as pre-cooked frozen rice, using pre-chopped fruit/veggies, using no salt added canned lentils. Pt endorses having more time on weekends to prepare meals. RD encouraged pt to prepare meal such as soup on  weekend and freeze in individual containers to use throughout the week. Patient will benefit from ongoing participation in intensive cardiac rehab for nutrition education, exercise, and lifestyle modification. Goals in action. Pt with history of chronic systolic heart failure. Pt reports starting to incorporate easy-to-prepare healthy food options; expects eating pattern to further improve during break from work. Pt continues to attend education classes, including cooking school. Patient will benefit from ongoing participation in intensive cardiac rehab for nutrition education, exercise, and lifestyle modification.        Personal Goal #2 Re-Evaluation   Personal Goal #2 Patient to improve diet quality by using the plate method as a guide for meal planning to include lean protein/plant protein, fruits, vegetables, whole grains, nonfat dairy as part of a well-balanced diet. Patient to improve diet quality by using the plate method as a guide for meal planning to include lean protein/plant protein, fruits, vegetables, whole grains, nonfat dairy as part of a well-balanced diet.        Personal Goal #3 Re-Evaluation   Personal Goal #3 Patient to identify strategies for weight loss with goal of 0.5-2 # per week of weight loss. Patient to identify strategies for weight loss with goal of 0.5-2 # per week of weight loss.         Nutrition Goals Re-Evaluation:  Nutrition Goals Re-Evaluation     Row Name 09/21/24 1521 10/19/24 1544           Goals   Current Weight 309 lb 1.4 oz (140.2 kg) 306 lb 10.6 oz (139.1 kg)  10/12/2024      Nutrition Goal Patient to identify strategies for reducing cardiovascular risk by attending the [facility] education and nutrition series weekly. Patient to identify strategies for reducing cardiovascular risk by attending the [facility] education and nutrition series weekly.      Comment -- Non-significant wt loss of <1% over past month.      Expected Outcome Goals in action. Pt  with history of chronic systolic heart failure. Non-significant wt gain of 1.4% noted over past 2 months. Pt reports difficulty making healthy food choices due to lack of time available for meal preparation. Describes lunch as largest meal; typically consists of salad, juice, or low sodium soup. Pt verbalizes importance of not overly restricting caloric intake. RD provided suggestions for easy meal prep such as pre-cooked frozen rice, using pre-chopped fruit/veggies, using no salt added canned lentils. Pt endorses having more time on weekends to prepare meals. RD encouraged pt to prepare meal such as soup on weekend and freeze in individual containers to use throughout the week. Patient will benefit from ongoing participation in intensive cardiac rehab for nutrition education, exercise, and lifestyle modification. Goals in action. Pt with history of chronic systolic heart failure. Pt reports starting to incorporate easy-to-prepare healthy food options; expects eating  pattern to further improve during break from work. Pt continues to attend education classes, including cooking school. Patient will benefit from ongoing participation in intensive cardiac rehab for nutrition education, exercise, and lifestyle modification.        Personal Goal #2 Re-Evaluation   Personal Goal #2 Patient to improve diet quality by using the plate method as a guide for meal planning to include lean protein/plant protein, fruits, vegetables, whole grains, nonfat dairy as part of a well-balanced diet. Patient to improve diet quality by using the plate method as a guide for meal planning to include lean protein/plant protein, fruits, vegetables, whole grains, nonfat dairy as part of a well-balanced diet.        Personal Goal #3 Re-Evaluation   Personal Goal #3 Patient to identify strategies for weight loss with goal of 0.5-2 # per week of weight loss. Patient to identify strategies for weight loss with goal of 0.5-2 # per week of  weight loss.         Nutrition Goals Discharge (Final Nutrition Goals Re-Evaluation):  Nutrition Goals Re-Evaluation - 10/19/24 1544       Goals   Current Weight 306 lb 10.6 oz (139.1 kg)   10/12/2024   Nutrition Goal Patient to identify strategies for reducing cardiovascular risk by attending the [facility] education and nutrition series weekly.    Comment Non-significant wt loss of <1% over past month.    Expected Outcome Goals in action. Pt with history of chronic systolic heart failure. Pt reports starting to incorporate easy-to-prepare healthy food options; expects eating pattern to further improve during break from work. Pt continues to attend education classes, including cooking school. Patient will benefit from ongoing participation in intensive cardiac rehab for nutrition education, exercise, and lifestyle modification.      Personal Goal #2 Re-Evaluation   Personal Goal #2 Patient to improve diet quality by using the plate method as a guide for meal planning to include lean protein/plant protein, fruits, vegetables, whole grains, nonfat dairy as part of a well-balanced diet.      Personal Goal #3 Re-Evaluation   Personal Goal #3 Patient to identify strategies for weight loss with goal of 0.5-2 # per week of weight loss.          Psychosocial: Target Goals: Acknowledge presence or absence of significant depression and/or stress, maximize coping skills, provide positive support system. Participant is able to verbalize types and ability to use techniques and skills needed for reducing stress and depression.  Initial Review & Psychosocial Screening:  Initial Psych Review & Screening - 07/14/24 1329       Initial Review   Current issues with Current Sleep Concerns      Family Dynamics   Good Support System? Yes    Comments Gianella has excellent support from her mother, boyfriend, best friend, and systems analyst.      Barriers   Psychosocial barriers to participate in  program There are no identifiable barriers or psychosocial needs.      Screening Interventions   Interventions Encouraged to exercise;Provide feedback about the scores to participant    Expected Outcomes Short Term goal: Identification and review with participant of any Quality of Life or Depression concerns found by scoring the questionnaire.;Long Term goal: The participant improves quality of Life and PHQ9 Scores as seen by post scores and/or verbalization of changes          Quality of Life Scores:  Quality of Life - 07/14/24 1328  Quality of Life   Select Quality of Life      Quality of Life Scores   Health/Function Pre 17.79 %    Socioeconomic Pre 23.17 %    Psych/Spiritual Pre 20.58 %    Family Pre 20 %    GLOBAL Pre 19.72 %         Scores of 19 and below usually indicate a poorer quality of life in these areas.  A difference of  2-3 points is a clinically meaningful difference.  A difference of 2-3 points in the total score of the Quality of Life Index has been associated with significant improvement in overall quality of life, self-image, physical symptoms, and general health in studies assessing change in quality of life.  PHQ-9: Review Flowsheet  More data exists      07/14/2024 03/17/2024 01/07/2023 11/01/2022 11/01/2020  Depression screen PHQ 2/9  Decreased Interest 1 0  3 1  Down, Depressed, Hopeless 0 1  3 1   PHQ - 2 Score 1 1  6 2   Altered sleeping 2 -  3 -  Tired, decreased energy 3 -  1 -  Change in appetite 0 -  3 -  Feeling bad or failure about yourself  0 -  - -  Trouble concentrating 1 -  0 -  Moving slowly or fidgety/restless 0 -  0 -  Suicidal thoughts 0 -  - -  PHQ-9 Score 7  -  13  -  Difficult doing work/chores Somewhat difficult - - Extremely dIfficult -    Details       Information is confidential and restricted. Go to Review Flowsheets to unlock data.   Data saved with a previous flowsheet row definition        Interpretation of  Total Score  Total Score Depression Severity:  1-4 = Minimal depression, 5-9 = Mild depression, 10-14 = Moderate depression, 15-19 = Moderately severe depression, 20-27 = Severe depression   Psychosocial Evaluation and Intervention:   Psychosocial Re-Evaluation:  Psychosocial Re-Evaluation     Row Name 07/29/24 1059 08/25/24 1122 09/22/24 1732 10/20/24 1617       Psychosocial Re-Evaluation   Current issues with Current Sleep Concerns Current Sleep Concerns None Identified None Identified    Comments Nathanel has not voiced any increased concerns or stressors during exercise at cardiac rehab Quality of life questionnaire reviewed.Tyashia denies being depressed. Sravya says that she continues to have low energy levels and shortness of breath. but has noted some improvement since she has been participating in cardiac rehab. Alysabeth says she is frustrated as she is active and has struggled to loose weight. Jernee says she has previously consulted with a nutrition management program that provided few results. Will ask regisitered  dietitian to speak with the patient Viegene has not voiced any increased concerns or stressors during exercise at cardiac rehab. Prue has not voiced any increased concerns or stressors during exercise at cardiac rehab.    Expected Outcomes Edan will have decreased or controlled stress upon completion of cardiac rehab Lavonya will have decreased or controlled stress upon completion of cardiac rehab Hermine will have decreased or controlled stress upon completion of cardiac rehab Laryah will have decreased or controlled stress upon completion of cardiac rehab    Interventions Encouraged to attend Cardiac Rehabilitation for the exercise;Stress management education;Relaxation education Encouraged to attend Cardiac Rehabilitation for the exercise;Stress management education;Relaxation education Encouraged to attend Cardiac Rehabilitation for the exercise;Stress management  education;Relaxation education Encouraged to attend  Cardiac Rehabilitation for the exercise;Stress management education;Relaxation education    Continue Psychosocial Services  Follow up required by staff Follow up required by staff Follow up required by staff Follow up required by staff       Psychosocial Discharge (Final Psychosocial Re-Evaluation):  Psychosocial Re-Evaluation - 10/20/24 1617       Psychosocial Re-Evaluation   Current issues with None Identified    Comments Jorden has not voiced any increased concerns or stressors during exercise at cardiac rehab.    Expected Outcomes Raeanne will have decreased or controlled stress upon completion of cardiac rehab    Interventions Encouraged to attend Cardiac Rehabilitation for the exercise;Stress management education;Relaxation education    Continue Psychosocial Services  Follow up required by staff          Vocational Rehabilitation: Provide vocational rehab assistance to qualifying candidates.   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 No vocational rehab needs.          Education: Education Goals: Education classes will be provided on a weekly basis, covering required topics. Participant will state understanding/return demonstration of topics presented.    Education     Row Name 07/20/24 1400     Education   Cardiac Education Topics Pritikin   Geographical Information Systems Officer Psychosocial   Psychosocial Workshop Healthy Sleep for a Healthy Heart   Instruction Review Code 1- Verbalizes Understanding   Class Start Time 1400   Class Stop Time 1445   Class Time Calculation (min) 45 min    Row Name 07/22/24 1300     Education   Cardiac  Education Topics Pritikin   Orthoptist   Educator Dietitian;Respiratory Therapist   Weekly Topic Simple Sides and Sauces   Instruction Review Code 1- Verbalizes Understanding   Class Start Time 1400   Class Stop Time 1435   Class Time Calculation (min) 35 min    Row Name 07/24/24 1400     Education   Cardiac Education Topics Pritikin   Nurse, Children's Exercise Physiologist   Select Psychosocial   Psychosocial How Our Thoughts Can Heal Our Hearts   Instruction Review Code 1- Verbalizes Understanding   Class Start Time 1400   Class Stop Time 1436   Class Time Calculation (min) 36 min    Row Name 07/27/24 1400     Education   Cardiac Education Topics Pritikin   Hospital Doctor Education   General Education Heart Disease Risk Reduction   Instruction Review Code 1- Verbalizes Understanding   Class Start Time 1410   Class Stop Time 1450   Class Time Calculation (min) 40 min    Row Name 07/29/24 1500     Cooking School   Educator Dietitian   Weekly Topic Powerhouse Plant-Based Proteins   Instruction Review Code 1- Verbalizes Understanding   Class Start Time 1400   Class Stop Time 1436   Class Time Calculation (min) 36 min    Row Name 07/31/24 1400  Education   Cardiac Education Topics Pritikin   Licensed Conveyancer Nutrition   Nutrition Facts on Fat   Instruction Review Code 1- Verbalizes Understanding   Class Start Time 1357   Class Stop Time 1436   Class Time Calculation (min) 39 min    Row Name 08/03/24 1400     Education   Cardiac Education Topics Pritikin   Geographical Information Systems Officer Psychosocial   Psychosocial Workshop From Head to Heart: The Power of a Healthy Outlook   Instruction Review Code 1- Verbalizes Understanding    Class Start Time 1400   Class Stop Time 1446   Class Time Calculation (min) 46 min    Row Name 08/05/24 1400     Education   Cardiac Education Topics Pritikin   Customer Service Manager   Weekly Topic Tasty Appetizers and Snacks   Instruction Review Code 1- Verbalizes Understanding   Class Start Time 1400   Class Stop Time 1442   Class Time Calculation (min) 42 min    Row Name 08/07/24 1400     Education   Cardiac Education Topics Pritikin   Select Workshops     Workshops   Educator Nurse   Select Exercise   Exercise Workshop Managing Heart Disease: Your Path to a Healthier Heart   Instruction Review Code 1- Verbalizes Understanding   Class Start Time 1405   Class Stop Time 1453   Class Time Calculation (min) 48 min    Row Name 08/17/24 1400     Education   Cardiac Education Topics Pritikin   Geographical Information Systems Officer Exercise   Exercise Workshop Location Manager and Fall Prevention   Instruction Review Code 1- Verbalizes Understanding   Class Start Time 1408   Class Stop Time 1500   Class Time Calculation (min) 52 min    Row Name 08/19/24 1500     Education   Cardiac Education Topics Pritikin   Customer Service Manager   Weekly Topic Fast and Healthy Breakfasts   Instruction Review Code 1- Verbalizes Understanding   Class Start Time 1400   Class Stop Time 1442   Class Time Calculation (min) 42 min    Row Name 08/21/24 1400     Education   Cardiac Education Topics Pritikin   Licensed Conveyancer Nutrition   Nutrition Other  Label Reading   Instruction Review Code 1- Verbalizes Understanding   Class Start Time 1358   Class Stop Time 1456   Class Time Calculation (min) 58 min    Row Name 08/24/24 1400     Education   Cardiac Education Topics Pritikin   Primary School Teacher Education   General Education Metabolic Syndrome and Belly Fat   Instruction Review Code 1- Verbalizes Understanding   Class Start Time 1407   Class Stop Time 1445   Class Time Calculation (min) 38 min    Row Name 08/26/24 1400     Education   Cardiac Education Topics Pritikin   [employer]  Probation Officer Your Pritikin Plate   Instruction Review Code 1- Verbalizes Understanding   Class Start Time 1358   Class Stop Time 1436   Class Time Calculation (min) 38 min    Row Name 08/31/24 1400     Education   Cardiac Education Topics Pritikin   Select Workshops     Workshops   Educator Exercise Physiologist   Select Psychosocial   Psychosocial Workshop Recognizing and Reducing Stress   Instruction Review Code 1- Verbalizes Understanding   Class Start Time 1359   Class Stop Time 1445   Class Time Calculation (min) 46 min    Row Name 09/02/24 1500     Education   Cardiac Education Topics Pritikin   Customer Service Manager   Weekly Topic [employer] Desserts   Instruction Review Code 1- Verbalizes Understanding   Class Start Time 1400   Class Stop Time 1438   Class Time Calculation (min) 38 min    Row Name 09/09/24 1400     Education   Cardiac Education Topics Pritikin   Customer Service Manager   Weekly Topic Efficiency Cooking - Meals in a Snap   Instruction Review Code 1- Verbalizes Understanding   Class Start Time 1400   Class Stop Time 1444   Class Time Calculation (min) 44 min    Row Name 09/11/24 1400     Education   Cardiac Education Topics Pritikin   Geographical Information Systems Officer Exercise   Exercise Workshop Exercise Basics: Diplomatic Services Operational Officer   Instruction Review Code 1- Verbalizes  Understanding   Class Start Time 1400   Class Stop Time 1452   Class Time Calculation (min) 52 min    Row Name 09/16/24 1300     Education   Cardiac Education Topics Pritikin   Customer Service Manager   Weekly Topic One-Pot Wonders   Instruction Review Code 1- Verbalizes Understanding   Class Start Time 1400   Class Stop Time 1445   Class Time Calculation (min) 45 min    Row Name 09/21/24 1400     Education   Cardiac Education Topics Pritikin   Select Workshops     Workshops   Educator Exercise Physiologist   Select Psychosocial   Psychosocial Workshop Focused Goals, Sustainable Changes   Instruction Review Code 1- Verbalizes Understanding   Class Start Time 1355   Class Stop Time 1438   Class Time Calculation (min) 43 min    Row Name 09/21/24 1500     Education   Cardiac Education Topics --   Select --     Workshops   Educator --   Physiological Scientist --   Psychosocial Workshop --   Instruction Review Code --   Class Start Time --   Class Stop Time --   Class Time Calculation (min) --    Row Name 09/23/24 1400     Education   Cardiac Education Topics Pritikin   Customer Service Manager   Weekly Topic Comforting Weekend Breakfasts   Instruction Review Code 1- Verbalizes Understanding   Class Start Time 1400   Class Stop Time 1434   Class Time Calculation (min) 34 min  Row Name 10/02/24 1400     Education   Cardiac Education Topics Pritikin   Select Core Videos     Core Videos   Educator Dietitian   Select Nutrition   Nutrition Vitamins and Minerals   Instruction Review Code 1- Verbalizes Understanding   Class Start Time 1400   Class Stop Time 1437   Class Time Calculation (min) 37 min    Row Name 10/05/24 1400     Education   Cardiac Education Topics Pritikin   Psychologist, Forensic Exercise Education   Exercise  Education Improving Performance   Instruction Review Code 1- Verbalizes Understanding   Class Start Time 1400   Class Stop Time 1443   Class Time Calculation (min) 43 min    Row Name 10/07/24 1400     Education   Cardiac Education Topics Pritikin   Customer Service Manager   Weekly Topic International Cuisine- Spotlight on the [employer] Zones   Instruction Review Code 1- Verbalizes Understanding   Class Start Time 1400   Class Stop Time 1437   Class Time Calculation (min) 37 min    Row Name 10/09/24 1400     Education   Cardiac Education Topics Pritikin   Glass Blower/designer Nutrition   Nutrition Workshop Fueling a Forensic Psychologist   Instruction Review Code 1- Tefl Teacher Understanding    Row Name 10/12/24 1400     Education   Cardiac Education Topics Pritikin   Geographical Information Systems Officer Psychosocial   Psychosocial Workshop Healthy Sleep for a Healthy Heart   Instruction Review Code 1- Tax Inspector   Class Start Time 1402   Class Stop Time 1453   Class Time Calculation (min) 51 min    Row Name 10/19/24 1400     Education   Cardiac Education Topics Pritikin   Psychologist, Forensic Psychosocial   Psychosocial How Our Thoughts Can Heal Our Hearts   Instruction Review Code 1- Verbalizes Understanding   Class Start Time 1400   Class Stop Time 1450   Class Time Calculation (min) 50 min      Core Videos: Exercise    Move It!  Clinical staff conducted group or individual video education with verbal and written material and guidebook.  Patient learns the recommended Pritikin exercise program. Exercise with the goal of living a long, healthy life. Some of the health benefits of exercise include controlled diabetes, healthier blood pressure levels, improved cholesterol levels,  improved heart and lung capacity, improved sleep, and better body composition. Everyone should speak with their doctor before starting or changing an exercise routine.  Biomechanical Limitations Clinical staff conducted group or individual video education with verbal and written material and guidebook.  Patient learns how biomechanical limitations can impact exercise and how we can mitigate and possibly overcome limitations to have an impactful and balanced exercise routine.  Body Composition Clinical staff conducted group or individual video education with verbal and written material and guidebook.  Patient learns that body composition (ratio of muscle mass to fat mass) is a key component to assessing overall fitness, rather than body weight alone. Increased fat mass, especially visceral belly fat, can  put us  at increased risk for metabolic syndrome, type 2 diabetes, heart disease, and even death. It is recommended to combine diet and exercise (cardiovascular and resistance training) to improve your body composition. Seek guidance from your physician and exercise physiologist before implementing an exercise routine.  Exercise Action Plan Clinical staff conducted group or individual video education with verbal and written material and guidebook.  Patient learns the recommended strategies to achieve and enjoy long-term exercise adherence, including variety, self-motivation, self-efficacy, and positive decision making. Benefits of exercise include fitness, good health, weight management, more energy, better sleep, less stress, and overall well-being.  Medical   Heart Disease Risk Reduction Clinical staff conducted group or individual video education with verbal and written material and guidebook.  Patient learns our heart is our most vital organ as it circulates oxygen, nutrients, white blood cells, and hormones throughout the entire body, and carries waste away. Data supports a plant-based eating  plan like the Pritikin Program for its effectiveness in slowing progression of and reversing heart disease. The video provides a number of recommendations to address heart disease.   Metabolic Syndrome and Belly Fat  Clinical staff conducted group or individual video education with verbal and written material and guidebook.  Patient learns what metabolic syndrome is, how it leads to heart disease, and how one can reverse it and keep it from coming back. You have metabolic syndrome if you have 3 of the following 5 criteria: abdominal obesity, high blood pressure, high triglycerides, low HDL cholesterol, and high blood sugar.  Hypertension and Heart Disease Clinical staff conducted group or individual video education with verbal and written material and guidebook.  Patient learns that high blood pressure, or hypertension, is very common in the [place] . Hypertension is largely due to excessive salt intake, but other important risk factors include being overweight, physical inactivity, drinking too much alcohol, smoking, and not eating enough potassium from fruits and vegetables. High blood pressure is a leading risk factor for heart attack, stroke, congestive heart failure, dementia, kidney failure, and premature death. Long-term effects of excessive salt intake include stiffening of the arteries and thickening of heart muscle and organ damage. Recommendations include ways to reduce hypertension and the risk of heart disease.  Diseases of Our Time - Focusing on Diabetes Clinical staff conducted group or individual video education with verbal and written material and guidebook.  Patient learns why the best way to stop diseases of our time is prevention, through food and other lifestyle changes. Medicine (such as prescription pills and surgeries) is often only a Band-Aid on the problem, not a long-term solution. Most common diseases of our time include obesity, type 2 diabetes, hypertension, heart  disease, and cancer. The Pritikin Program is recommended and has been proven to help reduce, reverse, and/or prevent the damaging effects of metabolic syndrome.  Nutrition   Overview of the Pritikin Eating Plan  Clinical staff conducted group or individual video education with verbal and written material and guidebook.  Patient learns about the Pritikin Eating Plan for disease risk reduction. The Pritikin Eating Plan emphasizes a wide variety of unrefined, minimally-processed carbohydrates, like fruits, vegetables, whole grains, and legumes. Go, Caution, and Stop food choices are explained. Plant-based and lean animal proteins are emphasized. Rationale provided for low sodium intake for blood pressure control, low added sugars for blood sugar stabilization, and low added fats and oils for coronary artery disease risk reduction and weight management.  Calorie Density  Clinical staff conducted group or individual video  education with verbal and written material and guidebook.  Patient learns about calorie density and how it impacts the Pritikin Eating Plan. Knowing the characteristics of the food you choose will help you decide whether those foods will lead to weight gain or weight loss, and whether you want to consume more or less of them. Weight loss is usually a side effect of the Pritikin Eating Plan because of its focus on low calorie-dense foods.  Label Reading  Clinical staff conducted group or individual video education with verbal and written material and guidebook.  Patient learns about the Pritikin recommended label reading guidelines and corresponding recommendations regarding calorie density, added sugars, sodium content, and whole grains.  Dining Out - Part 1  Clinical staff conducted group or individual video education with verbal and written material and guidebook.  Patient learns that restaurant meals can be sabotaging because they can be so high in calories, fat, sodium, and/or  sugar. Patient learns recommended strategies on how to positively address this and avoid unhealthy pitfalls.  Facts on Fats  Clinical staff conducted group or individual video education with verbal and written material and guidebook.  Patient learns that lifestyle modifications can be just as effective, if not more so, as many medications for lowering your risk of heart disease. A Pritikin lifestyle can help to reduce your risk of inflammation and atherosclerosis (cholesterol build-up, or plaque, in the artery walls). Lifestyle interventions such as dietary choices and physical activity address the cause of atherosclerosis. A review of the types of fats and their impact on blood cholesterol levels, along with dietary recommendations to reduce fat intake is also included.  Nutrition Action Plan  Clinical staff conducted group or individual video education with verbal and written material and guidebook.  Patient learns how to incorporate Pritikin recommendations into their lifestyle. Recommendations include planning and keeping personal health goals in mind as an important part of their success.  Healthy Mind-Set    Healthy Minds, Bodies, Hearts  Clinical staff conducted group or individual video education with verbal and written material and guidebook.  Patient learns how to identify when they are stressed. Video will discuss the impact of that stress, as well as the many benefits of stress management. Patient will also be introduced to stress management techniques. The way we think, act, and feel has an impact on our hearts.  How Our Thoughts Can Heal Our Hearts  Clinical staff conducted group or individual video education with verbal and written material and guidebook.  Patient learns that negative thoughts can cause depression and anxiety. This can result in negative lifestyle behavior and serious health problems. Cognitive behavioral therapy is an effective method to help control our thoughts in  order to change and improve our emotional outlook.  Additional Videos:  Exercise    Improving Performance  Clinical staff conducted group or individual video education with verbal and written material and guidebook.  Patient learns to use a non-linear approach by alternating intensity levels and lengths of time spent exercising to help burn more calories and lose more body fat. Cardiovascular exercise helps improve heart health, metabolism, hormonal balance, blood sugar control, and recovery from fatigue. Resistance training improves strength, endurance, balance, coordination, reaction time, metabolism, and muscle mass. Flexibility exercise improves circulation, posture, and balance. Seek guidance from your physician and exercise physiologist before implementing an exercise routine and learn your capabilities and proper form for all exercise.  Introduction to Yoga  Clinical staff conducted group or individual video education with verbal  and written material and guidebook.  Patient learns about yoga, a discipline of the coming together of mind, breath, and body. The benefits of yoga include improved flexibility, improved range of motion, better posture and core strength, increased lung function, weight loss, and positive self-image. Yogas heart health benefits include lowered blood pressure, healthier heart rate, decreased cholesterol and triglyceride levels, improved immune function, and reduced stress. Seek guidance from your physician and exercise physiologist before implementing an exercise routine and learn your capabilities and proper form for all exercise.  Medical   Aging: Enhancing Your Quality of Life  Clinical staff conducted group or individual video education with verbal and written material and guidebook.  Patient learns key strategies and recommendations to stay in good physical health and enhance quality of life, such as prevention strategies, having an advocate, securing a Health  Care Proxy and Power of Attorney, and keeping a list of medications and system for tracking them. It also discusses how to avoid risk for bone loss.  Biology of Weight Control  Clinical staff conducted group or individual video education with verbal and written material and guidebook.  Patient learns that weight gain occurs because we consume more calories than we burn (eating more, moving less). Even if your body weight is normal, you may have higher ratios of fat compared to muscle mass. Too much body fat puts you at increased risk for cardiovascular disease, heart attack, stroke, type 2 diabetes, and obesity-related cancers. In addition to exercise, following the Pritikin Eating Plan can help reduce your risk.  Decoding Lab Results  Clinical staff conducted group or individual video education with verbal and written material and guidebook.  Patient learns that lab test reflects one measurement whose values change over time and are influenced by many factors, including medication, stress, sleep, exercise, food, hydration, pre-existing medical conditions, and more. It is recommended to use the knowledge from this video to become more involved with your lab results and evaluate your numbers to speak with your doctor.   Diseases of Our Time - Overview  Clinical staff conducted group or individual video education with verbal and written material and guidebook.  Patient learns that according to the CDC, 50% to 70% of chronic diseases (such as obesity, type 2 diabetes, elevated lipids, hypertension, and heart disease) are avoidable through lifestyle improvements including healthier food choices, listening to satiety cues, and increased physical activity.  Sleep Disorders Clinical staff conducted group or individual video education with verbal and written material and guidebook.  Patient learns how good quality and duration of sleep are important to overall health and well-being. Patient also learns  about sleep disorders and how they impact health along with recommendations to address them, including discussing with a physician.  Nutrition  Dining Out - Part 2 Clinical staff conducted group or individual video education with verbal and written material and guidebook.  Patient learns how to plan ahead and communicate in order to maximize their dining experience in a healthy and nutritious manner. Included are recommended food choices based on the type of restaurant the patient is visiting.   Fueling a Banker conducted group or individual video education with verbal and written material and guidebook.  There is a strong connection between our food choices and our health. Diseases like obesity and type 2 diabetes are very prevalent and are in large-part due to lifestyle choices. The Pritikin Eating Plan provides plenty of food and hunger-curbing satisfaction. It is easy to follow, affordable, and  helps reduce health risks.  Menu Workshop  Clinical staff conducted group or individual video education with verbal and written material and guidebook.  Patient learns that restaurant meals can sabotage health goals because they are often packed with calories, fat, sodium, and sugar. Recommendations include strategies to plan ahead and to communicate with the manager, chef, or server to help order a healthier meal.  Planning Your Eating Strategy  Clinical staff conducted group or individual video education with verbal and written material and guidebook.  Patient learns about the Pritikin Eating Plan and its benefit of reducing the risk of disease. The Pritikin Eating Plan does not focus on calories. Instead, it emphasizes high-quality, nutrient-rich foods. By knowing the characteristics of the foods, we choose, we can determine their calorie density and make informed decisions.  Targeting Your Nutrition Priorities  Clinical staff conducted group or individual video education with  verbal and written material and guidebook.  Patient learns that lifestyle habits have a tremendous impact on disease risk and progression. This video provides eating and physical activity recommendations based on your personal health goals, such as reducing LDL cholesterol, losing weight, preventing or controlling type 2 diabetes, and reducing high blood pressure.  Vitamins and Minerals  Clinical staff conducted group or individual video education with verbal and written material and guidebook.  Patient learns different ways to obtain key vitamins and minerals, including through a recommended healthy diet. It is important to discuss all supplements you take with your doctor.   Healthy Mind-Set    Smoking Cessation  Clinical staff conducted group or individual video education with verbal and written material and guidebook.  Patient learns that cigarette smoking and tobacco addiction pose a serious health risk which affects millions of people. Stopping smoking will significantly reduce the risk of heart disease, lung disease, and many forms of cancer. Recommended strategies for quitting are covered, including working with your doctor to develop a successful plan.  Culinary   Becoming a Set Designer conducted group or individual video education with verbal and written material and guidebook.  Patient learns that cooking at home can be healthy, cost-effective, quick, and puts them in control. Keys to cooking healthy recipes will include looking at your recipe, assessing your equipment needs, planning ahead, making it simple, choosing cost-effective seasonal ingredients, and limiting the use of added fats, salts, and sugars.  Cooking - Breakfast and Snacks  Clinical staff conducted group or individual video education with verbal and written material and guidebook.  Patient learns how important breakfast is to satiety and nutrition through the entire day. Recommendations include key  foods to eat during breakfast to help stabilize blood sugar levels and to prevent overeating at meals later in the day. Planning ahead is also a key component.  Cooking - Educational Psychologist conducted group or individual video education with verbal and written material and guidebook.  Patient learns eating strategies to improve overall health, including an approach to cook more at home. Recommendations include thinking of animal protein as a side on your plate rather than center stage and focusing instead on lower calorie dense options like vegetables, fruits, whole grains, and plant-based proteins, such as beans. Making sauces in large quantities to freeze for later and leaving the skin on your vegetables are also recommended to maximize your experience.  Cooking - Healthy Salads and Dressing Clinical staff conducted group or individual video education with verbal and written material and guidebook.  Patient learns that vegetables,  fruits, whole grains, and legumes are the foundations of the Pritikin Eating Plan. Recommendations include how to incorporate each of these in flavorful and healthy salads, and how to create homemade salad dressings. Proper handling of ingredients is also covered. Cooking - Soups and [employer] - Soups and Desserts Clinical staff conducted group or individual video education with verbal and written material and guidebook.  Patient learns that Pritikin soups and desserts make for easy, nutritious, and delicious snacks and meal components that are low in sodium, fat, sugar, and calorie density, while high in vitamins, minerals, and filling fiber. Recommendations include simple and healthy ideas for soups and desserts.   Overview     The Pritikin Solution Program Overview Clinical staff conducted group or individual video education with verbal and written material and guidebook.  Patient learns that the results of the Pritikin Program have been  documented in more than 100 articles published in peer-reviewed journals, and the benefits include reducing risk factors for (and, in some cases, even reversing) high cholesterol, high blood pressure, type 2 diabetes, obesity, and more! An overview of the three key pillars of the Pritikin Program will be covered: eating well, doing regular exercise, and having a healthy mind-set.  WORKSHOPS  Exercise: Exercise Basics: Building Your Action Plan Clinical staff led group instruction and group discussion with PowerPoint presentation and patient guidebook. To enhance the learning environment the use of posters, models and videos may be added. At the conclusion of this workshop, patients will comprehend the difference between physical activity and exercise, as well as the benefits of incorporating both, into their routine. Patients will understand the FITT (Frequency, Intensity, Time, and Type) principle and how to use it to build an exercise action plan. In addition, safety concerns and other considerations for exercise and cardiac rehab will be addressed by the presenter. The purpose of this lesson is to promote a comprehensive and effective weekly exercise routine in order to improve patients overall level of fitness.   Managing Heart Disease: Your Path to a Healthier Heart Clinical staff led group instruction and group discussion with PowerPoint presentation and patient guidebook. To enhance the learning environment the use of posters, models and videos may be added.At the conclusion of this workshop, patients will understand the anatomy and physiology of the heart. Additionally, they will understand how Pritikins three pillars impact the risk factors, the progression, and the management of heart disease.  The purpose of this lesson is to provide a high-level overview of the heart, heart disease, and how the Pritikin lifestyle positively impacts risk factors.  Exercise Biomechanics Clinical  staff led group instruction and group discussion with PowerPoint presentation and patient guidebook. To enhance the learning environment the use of posters, models and videos may be added. Patients will learn how the structural parts of their bodies function and how these functions impact their daily activities, movement, and exercise. Patients will learn how to promote a neutral spine, learn how to manage pain, and identify ways to improve their physical movement in order to promote healthy living. The purpose of this lesson is to expose patients to common physical limitations that impact physical activity. Participants will learn practical ways to adapt and manage aches and pains, and to minimize their effect on regular exercise. Patients will learn how to maintain good posture while sitting, walking, and lifting.  Balance Training and Fall Prevention  Clinical staff led group instruction and group discussion with PowerPoint presentation and patient guidebook. To enhance the learning  environment the use of posters, models and videos may be added. At the conclusion of this workshop, patients will understand the importance of their sensorimotor skills (vision, proprioception, and the vestibular system) in maintaining their ability to balance as they age. Patients will apply a variety of balancing exercises that are appropriate for their current level of function. Patients will understand the common causes for poor balance, possible solutions to these problems, and ways to modify their physical environment in order to minimize their fall risk. The purpose of this lesson is to teach patients about the importance of maintaining balance as they age and ways to minimize their risk of falling.  WORKSHOPS   Nutrition:  Fueling a Ship Broker led group instruction and group discussion with PowerPoint presentation and patient guidebook. To enhance the learning environment the use of  posters, models and videos may be added. Patients will review the foundational principles of the Pritikin Eating Plan and understand what constitutes a serving size in each of the food groups. Patients will also learn Pritikin-friendly foods that are better choices when away from home and review make-ahead meal and snack options. Calorie density will be reviewed and applied to three nutrition priorities: weight maintenance, weight loss, and weight gain. The purpose of this lesson is to reinforce (in a group setting) the key concepts around what patients are recommended to eat and how to apply these guidelines when away from home by planning and selecting Pritikin-friendly options. Patients will understand how calorie density may be adjusted for different weight management goals.  Mindful Eating  Clinical staff led group instruction and group discussion with PowerPoint presentation and patient guidebook. To enhance the learning environment the use of posters, models and videos may be added. Patients will briefly review the concepts of the Pritikin Eating Plan and the importance of low-calorie dense foods. The concept of mindful eating will be introduced as well as the importance of paying attention to internal hunger signals. Triggers for non-hunger eating and techniques for dealing with triggers will be explored. The purpose of this lesson is to provide patients with the opportunity to review the basic principles of the Pritikin Eating Plan, discuss the value of eating mindfully and how to measure internal cues of hunger and fullness using the Hunger Scale. Patients will also discuss reasons for non-hunger eating and learn strategies to use for controlling emotional eating.  Targeting Your Nutrition Priorities Clinical staff led group instruction and group discussion with PowerPoint presentation and patient guidebook. To enhance the learning environment the use of posters, models and videos may be added.  Patients will learn how to determine their genetic susceptibility to disease by reviewing their family history. Patients will gain insight into the importance of diet as part of an overall healthy lifestyle in mitigating the impact of genetics and other environmental insults. The purpose of this lesson is to provide patients with the opportunity to assess their personal nutrition priorities by looking at their family history, their own health history and current risk factors. Patients will also be able to discuss ways of prioritizing and modifying the Pritikin Eating Plan for their highest risk areas  Menu  Clinical staff led group instruction and group discussion with PowerPoint presentation and patient guidebook. To enhance the learning environment the use of posters, models and videos may be added. Using menus brought in from [employer], or printed from [employer], patients will apply the Pritikin dining out guidelines that were presented in the [employer]  educational video. Patients will also be able to practice these guidelines in a variety of provided scenarios. The purpose of this lesson is to provide patients with the opportunity to practice hands-on learning of the Pritikin Dining Out guidelines with actual menus and practice scenarios.  Label Reading Clinical staff led group instruction and group discussion with PowerPoint presentation and patient guidebook. To enhance the learning environment the use of posters, models and videos may be added. Patients will review and discuss the Pritikin label reading guidelines presented in Pritikins Label Reading Educational series video. Using fool labels brought in from local grocery stores and markets, patients will apply the label reading guidelines and determine if the packaged food meet the Pritikin guidelines. The purpose of this lesson is to provide patients with the opportunity to review, discuss, and practice hands-on learning of the  Pritikin Label Reading guidelines with actual packaged food labels. Cooking School  Pritikins [employer] are designed to teach patients ways to prepare quick, simple, and affordable recipes at home. The importance of nutritions role in chronic disease risk reduction is reflected in its emphasis in the overall Pritikin program. By learning how to prepare essential core Pritikin Eating Plan recipes, patients will increase control over what they eat; be able to customize the flavor of foods without the use of added salt, sugar, or fat; and improve the quality of the food they consume. By learning a set of core recipes which are easily assembled, quickly prepared, and affordable, patients are more likely to prepare more healthy foods at home. These workshops focus on convenient breakfasts, simple entres, side dishes, and desserts which can be prepared with minimal effort and are consistent with nutrition recommendations for cardiovascular risk reduction. Cooking [employer] are taught by a armed forces logistics/support/administrative officer (RD) who has been trained by the [employer]. The chef or RD has a clear understanding of the importance of minimizing - if not completely eliminating - added fat, sugar, and sodium in recipes. Throughout the series of Cooking School Workshop sessions, patients will learn about healthy ingredients and efficient methods of cooking to build confidence in their capability to prepare    Cooking School weekly topics:  Adding Flavor- Sodium-Free  Fast and Healthy Breakfasts  Powerhouse Plant-Based Proteins  Satisfying Salads and Dressings  Simple Sides and Sauces  International Cuisine-Spotlight on the [employer] Zones  Delicious Desserts  Savory Soups  [employer] - Meals in a Astronomer Appetizers and Snacks  Comforting Weekend Breakfasts  One-Pot Wonders   Fast Evening Meals  Landscape Architect Your Pritikin Plate  WORKSHOPS    Healthy Mindset (Psychosocial):  Focused Goals, Sustainable Changes Clinical staff led group instruction and group discussion with PowerPoint presentation and patient guidebook. To enhance the learning environment the use of posters, models and videos may be added. Patients will be able to apply effective goal setting strategies to establish at least one personal goal, and then take consistent, meaningful action toward that goal. They will learn to identify common barriers to achieving personal goals and develop strategies to overcome them. Patients will also gain an understanding of how our mind-set can impact our ability to achieve goals and the importance of cultivating a positive and growth-oriented mind-set. The purpose of this lesson is to provide patients with a deeper understanding of how to set and achieve personal goals, as well as the tools and strategies needed to overcome common obstacles which may arise along the way.  From  Head to Heart: The Power of a Healthy Outlook  Clinical staff led group instruction and group discussion with PowerPoint presentation and patient guidebook. To enhance the learning environment the use of posters, models and videos may be added. Patients will be able to recognize and describe the impact of emotions and mood on physical health. They will discover the importance of self-care and explore self-care practices which may work for them. Patients will also learn how to utilize the 4 Cs to cultivate a healthier outlook and better manage stress and challenges. The purpose of this lesson is to demonstrate to patients how a healthy outlook is an essential part of maintaining good health, especially as they continue their cardiac rehab journey.  Healthy Sleep for a Healthy Heart Clinical staff led group instruction and group discussion with PowerPoint presentation and patient guidebook. To enhance the learning environment the use of posters, models and videos may be  added. At the conclusion of this workshop, patients will be able to demonstrate knowledge of the importance of sleep to overall health, well-being, and quality of life. They will understand the symptoms of, and treatments for, common sleep disorders. Patients will also be able to identify daytime and nighttime behaviors which impact sleep, and they will be able to apply these tools to help manage sleep-related challenges. The purpose of this lesson is to provide patients with a general overview of sleep and outline the importance of quality sleep. Patients will learn about a few of the most common sleep disorders. Patients will also be introduced to the concept of sleep hygiene, and discover ways to self-manage certain sleeping problems through simple daily behavior changes. Finally, the workshop will motivate patients by clarifying the links between quality sleep and their goals of heart-healthy living.   Recognizing and Reducing Stress Clinical staff led group instruction and group discussion with PowerPoint presentation and patient guidebook. To enhance the learning environment the use of posters, models and videos may be added. At the conclusion of this workshop, patients will be able to understand the types of stress reactions, differentiate between acute and chronic stress, and recognize the impact that chronic stress has on their health. They will also be able to apply different coping mechanisms, such as reframing negative self-talk. Patients will have the opportunity to practice a variety of stress management techniques, such as deep abdominal breathing, progressive muscle relaxation, and/or guided imagery.  The purpose of this lesson is to educate patients on the role of stress in their lives and to provide healthy techniques for coping with it.  Learning Barriers/Preferences:  Learning Barriers/Preferences - 07/14/24 1419       Learning Barriers/Preferences   Learning Barriers None     Learning Preferences Audio;Computer/Internet;Group Instruction;Individual Instruction;Pictoral;Skilled Demonstration;Verbal Instruction;Video;Written Material          Education Topics:  Knowledge Questionnaire Score:  Knowledge Questionnaire Score - 07/14/24 1559       Knowledge Questionnaire Score   Pre Score 24/24          Core Components/Risk Factors/Patient Goals at Admission:  Personal Goals and Risk Factors at Admission - 07/14/24 1433       Core Components/Risk Factors/Patient Goals on Admission    Weight Management Yes;Obesity;Weight Loss    Intervention Weight Management/Obesity: Establish reasonable short term and long term weight goals.;Obesity: Provide education and appropriate resources to help participant work on and attain dietary goals.    Admit Weight 304 lb 0.2 oz (137.9 kg)    Expected Outcomes Short Term:  Continue to assess and modify interventions until short term weight is achieved;Long Term: Adherence to nutrition and physical activity/exercise program aimed toward attainment of established weight goal;Weight Loss: Understanding of general recommendations for a balanced deficit meal plan, which promotes 1-2 lb weight loss per week and includes a negative energy balance of [phone] kcal/d    Heart Failure Yes    Intervention Provide a combined exercise and nutrition program that is supplemented with education, support and counseling about heart failure. Directed toward relieving symptoms such as shortness of breath, decreased exercise tolerance, and extremity edema.    Expected Outcomes Improve functional capacity of life;Short term: Attendance in program 2-3 days a week with increased exercise capacity. Reported lower sodium intake. Reported increased fruit and vegetable intake. Reports medication compliance.;Short term: Daily weights obtained and reported for increase. Utilizing diuretic protocols set by physician.;Long term: Adoption of self-care skills and  reduction of barriers for early signs and symptoms recognition and intervention leading to self-care maintenance.    Lipids Yes    Intervention Provide education and support for participant on nutrition & aerobic/resistive exercise along with prescribed medications to achieve LDL 70mg , HDL >40mg .    Expected Outcomes Short Term: Participant states understanding of desired cholesterol values and is compliant with medications prescribed. Participant is following exercise prescription and nutrition guidelines.;Long Term: Cholesterol controlled with medications as prescribed, with individualized exercise RX and with personalized nutrition plan. Value goals: LDL < 70mg , HDL > 40 mg.          Core Components/Risk Factors/Patient Goals Review:   Goals and Risk Factor Review     Row Name 07/29/24 1103 08/25/24 1125 09/22/24 1733         Core Components/Risk Factors/Patient Goals Review   Personal Goals Review Weight Management/Obesity;Heart Failure;Lipids Weight Management/Obesity;Heart Failure;Lipids Weight Management/Obesity;Heart Failure;Lipids     Review Pierre started cardiac rehab on 07/20/24 and is off to a good start to exercise. Vital signs have been stable. Gwynevere is doing well with exercise at cardiac rehab. Vital signs have been stable. Glendola has gained 1.1 kg since starting the program Briyanna continues to well with exercise at cardiac rehab. Vital signs remain stable. Harshitha has increased her workloads. No further ectopy has been noted. Will continue to monitor.     Expected Outcomes Terry will continue to participate in cardiac rehab for exercise, nutrition and lifestyle modifications. Avaline will continue to participate in cardiac rehab for exercise, nutrition and lifestyle modifications. Nancie will continue to participate in cardiac rehab for exercise, nutrition and lifestyle modifications.        Core Components/Risk Factors/Patient Goals at Discharge (Final Review):    Goals and Risk Factor Review - 09/22/24 1733       Core Components/Risk Factors/Patient Goals Review   Personal Goals Review Weight Management/Obesity;Heart Failure;Lipids    Review Merrill continues to well with exercise at cardiac rehab. Vital signs remain stable. Oneisha has increased her workloads. No further ectopy has been noted. Will continue to monitor.    Expected Outcomes Leiann will continue to participate in cardiac rehab for exercise, nutrition and lifestyle modifications.          ITP Comments:  ITP Comments     Row Name 07/14/24 1325 07/29/24 1058 08/25/24 1119 09/22/24 1731 10/20/24 1617   ITP Comments Wilbert Bihari, MD: Medical Director. Introduction to the [facility] / Intensive Cardiac Rehab. Intial orientation packet reviewed with the patient. 30 Day ITP review.  Terriana started cardiac rehab on 07/20/24.  Alexsa did well with exercise 30 Day ITP review.  Alonnie has good attendance and participation with exercise at  cardiac rehab 30 Day ITP review.  Arial continues to have good attendance and participation with exercise at  cardiac rehab 30 Day ITP review.  Myley has good attendance and participation with exercise at cardiac rehab      Comments: see ITP comments    [1]  Current Outpatient Medications:    aspirin  EC 81 MG tablet, Take 1 tablet (81 mg total) by mouth daily., Disp: , Rfl:    FARXIGA  10 MG TABS tablet, TAKE 1 TABLET BY MOUTH DAILY BEFORE BREAKFAST., Disp: 90 tablet, Rfl: 1   furosemide  (LASIX ) 40 MG tablet, Take 2 tablets (80 mg total) by mouth daily., Disp: 60 tablet, Rfl: 11   losartan  (COZAAR ) 25 MG tablet, Take 1 tablet (25 mg total) by mouth daily., Disp: 90 tablet, Rfl: 3   MAGNESIUM  PO, Take 1 tablet by mouth 2 (two) times a week., Disp: , Rfl:    metoprolol  succinate (TOPROL -XL) 25 MG 24 hr tablet, Take 0.5 tablets (12.5 mg total) by mouth 2 (two) times daily., Disp: 90 tablet, Rfl: 3   Omega-3 Fatty Acids (FISH OIL) 1000  MG CAPS, Take 1,000 mg by mouth 2 (two) times a week., Disp: , Rfl:    Probiotic Product (PROBIOTIC-10 PO), Take 1 tablet by mouth 2 (two) times a week., Disp: , Rfl:    UBIQUINONE PO, Take 100 mg by mouth 2 (two) times a week., Disp: , Rfl:    VITAMIN D  PO, Take 1 tablet by mouth 3 (three) times a week., Disp: , Rfl:    VITAMIN E PO, Take 1 tablet by mouth 3 (three) times a week., Disp: , Rfl:    zinc gluconate 50 MG tablet, Take 50 mg by mouth 2 (two) times a week., Disp: , Rfl:  [2]  Social History Tobacco Use  Smoking Status Never   Passive exposure: Current  Smokeless Tobacco Never

## 2024-10-21 ENCOUNTER — Encounter (HOSPITAL_COMMUNITY)
Admission: RE | Admit: 2024-10-21 | Discharge: 2024-10-21 | Disposition: A | Discharge status: Home / Self Care | Source: Ambulatory Visit | Attending: Cardiology | Admitting: Cardiology

## 2024-10-21 DIAGNOSIS — I5022 Chronic systolic (congestive) heart failure: Secondary | ICD-10-CM | POA: Diagnosis not present

## 2024-10-23 ENCOUNTER — Encounter (HOSPITAL_COMMUNITY)
Admission: RE | Admit: 2024-10-23 | Discharge: 2024-10-23 | Disposition: A | Discharge status: Home / Self Care | Source: Ambulatory Visit | Attending: Cardiology | Admitting: Cardiology

## 2024-10-23 DIAGNOSIS — I5022 Chronic systolic (congestive) heart failure: Secondary | ICD-10-CM | POA: Diagnosis present

## 2024-10-23 DIAGNOSIS — Z5189 Encounter for other specified aftercare: Secondary | ICD-10-CM | POA: Insufficient documentation

## 2024-10-26 ENCOUNTER — Encounter (HOSPITAL_COMMUNITY)

## 2024-10-26 DIAGNOSIS — I5022 Chronic systolic (congestive) heart failure: Secondary | ICD-10-CM

## 2024-10-26 DIAGNOSIS — Z5189 Encounter for other specified aftercare: Secondary | ICD-10-CM | POA: Diagnosis not present

## 2024-10-28 ENCOUNTER — Encounter (HOSPITAL_COMMUNITY)
Admission: RE | Admit: 2024-10-28 | Discharge: 2024-10-28 | Disposition: A | Discharge status: Home / Self Care | Source: Ambulatory Visit | Attending: Cardiology | Admitting: Cardiology

## 2024-10-28 DIAGNOSIS — I5022 Chronic systolic (congestive) heart failure: Secondary | ICD-10-CM

## 2024-10-28 DIAGNOSIS — Z5189 Encounter for other specified aftercare: Secondary | ICD-10-CM | POA: Diagnosis not present

## 2024-10-30 ENCOUNTER — Encounter (HOSPITAL_COMMUNITY)
Admission: RE | Admit: 2024-10-30 | Discharge: 2024-10-30 | Disposition: A | Source: Ambulatory Visit | Attending: Cardiology

## 2024-10-30 DIAGNOSIS — I471 Supraventricular tachycardia, unspecified: Secondary | ICD-10-CM

## 2024-10-30 DIAGNOSIS — Z5189 Encounter for other specified aftercare: Secondary | ICD-10-CM | POA: Diagnosis not present

## 2024-10-30 DIAGNOSIS — I5022 Chronic systolic (congestive) heart failure: Secondary | ICD-10-CM

## 2024-11-02 ENCOUNTER — Encounter (HOSPITAL_COMMUNITY)
Admission: RE | Admit: 2024-11-02 | Discharge: 2024-11-02 | Disposition: A | Source: Ambulatory Visit | Attending: Cardiology

## 2024-11-02 VITALS — Ht 70.0 in | Wt 308.2 lb

## 2024-11-02 DIAGNOSIS — Z5189 Encounter for other specified aftercare: Secondary | ICD-10-CM | POA: Diagnosis not present

## 2024-11-02 DIAGNOSIS — I5022 Chronic systolic (congestive) heart failure: Secondary | ICD-10-CM

## 2024-11-03 ENCOUNTER — Telehealth (HOSPITAL_COMMUNITY): Payer: Self-pay

## 2024-11-03 NOTE — Telephone Encounter (Signed)
 Called patient and informed her paperwork was completed. She will pick it up tomorrow at the front desk

## 2024-11-04 ENCOUNTER — Encounter (HOSPITAL_COMMUNITY)
Admission: RE | Admit: 2024-11-04 | Discharge: 2024-11-04 | Disposition: A | Discharge status: Home / Self Care | Source: Ambulatory Visit | Attending: Cardiology | Admitting: Cardiology

## 2024-11-04 DIAGNOSIS — Z5189 Encounter for other specified aftercare: Secondary | ICD-10-CM | POA: Diagnosis not present

## 2024-11-04 DIAGNOSIS — I5022 Chronic systolic (congestive) heart failure: Secondary | ICD-10-CM

## 2024-11-04 NOTE — Progress Notes (Signed)
 Discharge Progress Report  Patient Details  Name: Stacey Huerta MRN: [id number] Date of Birth: May 06, 1976 Referring Provider:   Flowsheet Row INTENSIVE CARDIAC REHAB ORIENT from 07/14/2024 in [facility]  Referring Provider Zenaida Morene PARAS, MD     Number of Visits: 63  Reason for Discharge:  Patient reached a stable level of exercise. Patient independent in their exercise. Patient has met program and personal goals.  Smoking History:  Tobacco Use History[1]  Diagnosis:  Heart failure, chronic systolic (HCC)  ADL [facility]:   Initial Exercise Prescription:  Initial Exercise Prescription - 07/14/24 1500       Date of Initial Exercise RX and Referring Provider   Date 07/14/24    Referring Provider Zenaida Morene PARAS, MD    Expected Discharge Date 10/07/24      Recumbant Bike   Level 1    RPM 25    Watts 15    Minutes 15    METs 1.6      NuStep   Level 1    SPM 85    Minutes 15    METs 2      Prescription Details   Frequency (times per week) 3    Duration Progress to 30 minutes of continuous aerobic without signs/symptoms of physical distress      Intensity   THRR 40-80% of Max Heartrate 68-136    Ratings of Perceived Exertion 11-13    Perceived Dyspnea 0-4      Progression   Progression Continue to progress workloads to maintain intensity without signs/symptoms of physical distress.      Resistance Training   Training Prescription Yes    Weight 5 lbs    Reps 10-15          Discharge Exercise Prescription (Final Exercise Prescription Changes):  Exercise Prescription Changes - 11/04/24 1634       Response to Exercise   Blood Pressure (Admit) 94/68    Blood Pressure (Exit) 92/70    Heart Rate (Admit) 104 bpm    Heart Rate (Exercise) 166 bpm    Heart Rate (Exit) 105 bpm    Rating of Perceived Exertion (Exercise) 10    Perceived Dyspnea (Exercise) 0    Symptoms None    Comments Pt  graduated teh [employer] [facility]    Duration Progress to 30 minutes of  aerobic without signs/symptoms of physical distress    Intensity THRR unchanged      Progression   Progression Continue to progress workloads to maintain intensity without signs/symptoms of physical distress.    Average METs 3.7      Resistance Training   Weight 6 lbs    Reps 10-15    Time 10 Minutes      Recumbant Bike   Level 4    RPM 64    Watts 62    Minutes 15    METs 3.2      Elliptical   Level 1    Speed 1    Minutes 15    METs 4.2          Functional Capacity:  6 Minute Walk     Row Name 07/14/24 1454 11/02/24 1613       6 Minute Walk   Phase Initial Discharge    Distance 1068 feet 1215 feet    Distance % Change -- 13.76 %    Distance Feet Change -- 147 ft    Walk  Time 6 minutes 6 minutes    # of Rest Breaks 2 0    MPH 2.02 2.3    METS 2.71 3.24    RPE 15 12    Perceived Dyspnea  2.5 0    VO2 Peak 9.49 11.34    Symptoms Yes (comment) No    Comments Shortness of breath, mild with difficulty. --    Resting HR 84 bpm 99 bpm    Resting BP 94/60 98/74    Resting Oxygen Saturation  99 % --    Exercise Oxygen Saturation  during 6 min walk 100 % --    Max Ex. HR 133 bpm 152 bpm    Max Ex. BP 108/70 122/74    2 Minute Post BP 100/68 --       Psychological, QOL, Others - Outcomes: PHQ 2/9:    11/04/2024    3:06 PM 10/21/2024    2:45 PM 07/14/2024    1:22 PM 03/17/2024    4:26 PM 01/07/2023    1:02 PM  Depression screen PHQ 2/9  Decreased Interest 0 0 1 0   Down, Depressed, Hopeless 0 0 0 1   PHQ - 2 Score 0 0 1 1   Altered sleeping 0 1 2    Tired, decreased energy 1 1 3     Change in appetite 0 0 0    Feeling bad or failure about yourself  0 0 0    Trouble concentrating 0 0 1    Moving slowly or fidgety/restless 0 0 0    Suicidal thoughts 0 0 0    PHQ-9 Score 1 2 7      Difficult doing work/chores Not difficult at all Somewhat difficult Somewhat difficult        Information is confidential and restricted. Go to Review Flowsheets to unlock data.   Data saved with a previous flowsheet row definition    Quality of Life:  Quality of Life - 11/04/24 1626       Quality of Life   Select Quality of Life   Only completed half         Personal Goals: Goals established at orientation with interventions provided to work toward goal.  Personal Goals and Risk Factors at Admission - 07/14/24 1433       Core Components/Risk Factors/Patient Goals on Admission    Weight Management Yes;Obesity;Weight Loss    Intervention Weight Management/Obesity: Establish reasonable short term and long term weight goals.;Obesity: Provide education and appropriate resources to help participant work on and attain dietary goals.    Admit Weight 304 lb 0.2 oz (137.9 kg)    Expected Outcomes Short Term: Continue to assess and modify interventions until short term weight is achieved;Long Term: Adherence to nutrition and physical activity/exercise program aimed toward attainment of established weight goal;Weight Loss: Understanding of general recommendations for a balanced deficit meal plan, which promotes 1-2 lb weight loss per week and includes a negative energy balance of [phone] kcal/d    Heart Failure Yes    Intervention Provide a combined exercise and nutrition program that is supplemented with education, support and counseling about heart failure. Directed toward relieving symptoms such as shortness of breath, decreased exercise tolerance, and extremity edema.    Expected Outcomes Improve functional capacity of life;Short term: Attendance in program 2-3 days a week with increased exercise capacity. Reported lower sodium intake. Reported increased fruit and vegetable intake. Reports medication compliance.;Short term: Daily weights obtained and reported for increase. Utilizing diuretic protocols  set by physician.;Long term: Adoption of self-care skills and reduction of barriers for  early signs and symptoms recognition and intervention leading to self-care maintenance.    Lipids Yes    Intervention Provide education and support for participant on nutrition & aerobic/resistive exercise along with prescribed medications to achieve LDL 70mg , HDL >40mg .    Expected Outcomes Short Term: Participant states understanding of desired cholesterol values and is compliant with medications prescribed. Participant is following exercise prescription and nutrition guidelines.;Long Term: Cholesterol controlled with medications as prescribed, with individualized exercise RX and with personalized nutrition plan. Value goals: LDL < 70mg , HDL > 40 mg.           Personal Goals Discharge:  Goals and Risk Factor Review     Row Name 07/29/24 1103 08/25/24 1125 09/22/24 1733         Core Components/Risk Factors/Patient Goals Review   Personal Goals Review Weight Management/Obesity;Heart Failure;Lipids Weight Management/Obesity;Heart Failure;Lipids Weight Management/Obesity;Heart Failure;Lipids     Review Alexandera started cardiac rehab on 07/20/24 and is off to a good start to exercise. Vital signs have been stable. Chaz is doing well with exercise at cardiac rehab. Vital signs have been stable. Shareese has gained 1.1 kg since starting the program Brenly continues to well with exercise at cardiac rehab. Vital signs remain stable. Jnai has increased her workloads. No further ectopy has been noted. Will continue to monitor.     Expected Outcomes Alianny will continue to participate in cardiac rehab for exercise, nutrition and lifestyle modifications. Gennifer will continue to participate in cardiac rehab for exercise, nutrition and lifestyle modifications. Aryiana will continue to participate in cardiac rehab for exercise, nutrition and lifestyle modifications.        Exercise Goals and Review:  Exercise Goals     Row Name 07/14/24 1325             Exercise Goals   Increase Physical  Activity Yes       Intervention Provide advice, education, support and counseling about physical activity/exercise needs.;Develop an individualized exercise prescription for aerobic and resistive training based on initial evaluation findings, risk stratification, comorbidities and participant's personal goals.       Expected Outcomes Short Term: Attend rehab on a regular basis to increase amount of physical activity.;Long Term: Exercising regularly at least 3-5 days a week.;Long Term: Add in home exercise to make exercise part of routine and to increase amount of physical activity.       Increase Strength and Stamina Yes       Intervention Provide advice, education, support and counseling about physical activity/exercise needs.;Develop an individualized exercise prescription for aerobic and resistive training based on initial evaluation findings, risk stratification, comorbidities and participant's personal goals.       Expected Outcomes Short Term: Increase workloads from initial exercise prescription for resistance, speed, and METs.;Short Term: Perform resistance training exercises routinely during rehab and add in resistance training at home;Long Term: Improve cardiorespiratory fitness, muscular endurance and strength as measured by increased METs and functional capacity ( )       Able to understand and use rate of perceived exertion (RPE) scale Yes       Intervention Provide education and explanation on how to use RPE scale       Expected Outcomes Short Term: Able to use RPE daily in rehab to express subjective intensity level;Long Term:  Able to use RPE to guide intensity level when exercising independently       Knowledge and  understanding of Target Heart Rate Range (THRR) Yes       Intervention Provide education and explanation of THRR including how the numbers were predicted and where they are located for reference       Expected Outcomes Short Term: Able to state/look up THRR;Long Term: Able  to use THRR to govern intensity when exercising independently;Short Term: Able to use daily as guideline for intensity in rehab       Understanding of Exercise Prescription Yes       Intervention Provide education, explanation, and written materials on patient's individual exercise prescription       Expected Outcomes Short Term: Able to explain program exercise prescription;Long Term: Able to explain home exercise prescription to exercise independently          Exercise Goals Re-Evaluation:  Exercise Goals Re-Evaluation     Row Name 07/20/24 1630 08/21/24 1704 09/09/24 1717 10/05/24 1630 11/04/24 1636     Exercise Goal Re-Evaluation   Exercise Goals Review Increase Physical Activity;Understanding of Exercise Prescription;Increase Strength and Stamina;Knowledge and understanding of Target Heart Rate Range (THRR);Able to understand and use rate of perceived exertion (RPE) scale Increase Physical Activity;Understanding of Exercise Prescription;Increase Strength and Stamina;Knowledge and understanding of Target Heart Rate Range (THRR);Able to understand and use rate of perceived exertion (RPE) scale Increase Physical Activity;Understanding of Exercise Prescription;Increase Strength and Stamina;Knowledge and understanding of Target Heart Rate Range (THRR);Able to understand and use rate of perceived exertion (RPE) scale Increase Physical Activity;Understanding of Exercise Prescription;Increase Strength and Stamina;Knowledge and understanding of Target Heart Rate Range (THRR);Able to understand and use rate of perceived exertion (RPE) scale Increase Physical Activity;Understanding of Exercise Prescription;Increase Strength and Stamina;Knowledge and understanding of Target Heart Rate Range (THRR);Able to understand and use rate of perceived exertion (RPE) scale   Comments Pt first day in the [facility] program. Pt tolerated exercise well with an average MET level of 2.6. She is off to a good start and is  learning her THRR, RPE and ExRx. Slightly over THRR, will let her know on her net visit. Remained asymptomatic Reviewed MET's and goals. Pt tolerated exercise well with an average MET level of 3.25. She is doign well and progressing WLs and MET's. She is starting to reach her THRR. Will reahc out about increases in [facility] for Dr. office. She is already getting better control of her SOB and is gaining mroe cardio endurance. Overall she's doing well and feels well. Will go over home ExRx soon, she has a memebership to [employer] and likes walking Reviewed MET's and goals. Pt tolerated exercise well with an average MET level of 3.25. She has recieved her new THRR and is doing well with exercise. She did have a run of SVT last week. So she has a Zio patch for the next two weeks and an increase in her Metoprolol . Will wait to see results from Zio patch before going over home ExEx, but she does have a membership to [employer] and does enjoy walking Reviewed MET's and goals. Pt tolerated exercise well with an average MET level of 3.65. She has been doing good with exercise. Transitioning to Upright Eliiptical. She is feeling good and is still waiting for her Zio patch results but is feeling an increase in energy. Pt graduated the [employer]. Pt tolerated exercise well with an average MET level of 3.7. She did great on her post walk test and increased by 13ft for a total of 1215. She will continue to exercise on  her own by seeing her personal trainer, walking, weights, treadmill an dcommunity gym (Maybe [employer], [employer] or [employer]) 5 days for 30-60 mins   Expected Outcomes Will continue to monitor patient and progress workloads as tolerated without sign or symptom Will continue to monitor patient and progress workloads as tolerated without sign or symptom Will continue to monitor patient and progress workloads as tolerated without sign or symptom Will continue to monitor patient and progress workloads as  tolerated without sign or symptom Pt will continue to exercise on her own and gain strength      Nutrition & Weight - Outcomes:  Pre Biometrics - 07/14/24 1254       Pre Biometrics   Waist Circumference 53 inches    Hip Circumference 60 inches    Waist to Hip Ratio 0.88 %    Triceps Skinfold 48 mm    % Body Fat 54.2 %    Grip Strength 33 kg    Flexibility 17.88 in    Single Leg Stand 30 seconds          Post Biometrics - 11/02/24 1616        Post  Biometrics   Height 5' 10 (1.778 m)    Weight 139.8 kg    Waist Circumference 47.75 inches    Hip Circumference 57 inches    Waist to Hip Ratio 0.84 %    BMI (Calculated) 44.22    Triceps Skinfold 37 mm    % Body Fat 51.1 %    Grip Strength 42 kg    Flexibility 19 in    Single Leg Stand 30 seconds          Nutrition:  Nutrition Therapy & Goals - 08/31/24 1532       Nutrition Therapy   Diet Heart Healthy      Personal Nutrition Goals   Nutrition Goal Patient to identify strategies for reducing cardiovascular risk by attending the [facility] education and nutrition series weekly.    Personal Goal #2 Patient to improve diet quality by using the plate method as a guide for meal planning to include lean protein/plant protein, fruits, vegetables, whole grains, nonfat dairy as part of a well-balanced diet.    Personal Goal #3 Patient to identify strategies for weight loss with goal of 0.5-2 # per week of weight loss.    Comments Pt with history of chronic systolic heart failure. Pt reports working to maintain consistency in making healthy food choices in order to promote heart health as well as wt loss. Current BMI >40 kg/m2. Patient will benefit from participation in intensive cardiac rehab for nutrition education, exercise, and lifestyle modification.      Intervention Plan   Intervention Prescribe, educate and counsel regarding individualized specific dietary modifications aiming towards targeted core components such as  weight, hypertension, lipid management, diabetes, heart failure and other comorbidities.    Expected Outcomes Short Term Goal: Understand basic principles of dietary content, such as calories, fat, sodium, cholesterol and nutrients.;Long Term Goal: Adherence to prescribed nutrition plan.          Nutrition Discharge:  Nutrition Assessments - 07/22/24 1632       Rate Your Plate Scores   Pre Score 64          Education Questionnaire Score:  Knowledge Questionnaire Score - 11/04/24 1632       Knowledge Questionnaire Score   Post Score 24/24          Goals reviewed with  patient; copy given to patient.Pt graduates from  Intensive/Traditional cardiac rehab program on 11/04/24 with completion of 63 exercise and education sessions. Pt maintained good attendance and progressed nicely during their participation in rehab as evidenced by increased MET level. Jalicia increased her distance on her post exercise walk test by 147 feet.  Medication list reconciled. Repeat  PHQ score- 1 .  Pt has made significant lifestyle changes and should be commended for her success. Kailey achieved her goals during cardiac rehab.   Pt plans to continue exercise at [employer] and work with a systems analyst twice a week. Brody says that participating in cardiac rehab has bee helpful. We are proud of Nahlia's progress! Ellyssa says that participating in cardiac rehab has been helpful.Hadassah Elpidio Quan RN BSN      [1]  Social History Tobacco Use  Smoking Status Never   Passive exposure: Current  Smokeless Tobacco Never

## 2024-11-17 ENCOUNTER — Ambulatory Visit (HOSPITAL_COMMUNITY): Payer: Self-pay | Admitting: Cardiology

## 2024-11-18 ENCOUNTER — Telehealth (HOSPITAL_COMMUNITY): Payer: Self-pay

## 2024-11-18 NOTE — Telephone Encounter (Signed)
 Received a fax requesting medical records from [employer]. Records were successfully faxed to: [phone] ,which was the number provided.. Medical request form will be scanned into patients chart.  Phone number: [phone]

## 2024-12-09 ENCOUNTER — Ambulatory Visit (HOSPITAL_COMMUNITY): Admitting: Cardiology

## 2024-12-10 ENCOUNTER — Ambulatory Visit (HOSPITAL_COMMUNITY): Admitting: Internal Medicine

## 2025-06-18 ENCOUNTER — Ambulatory Visit: Admitting: Internal Medicine
